# Patient Record
Sex: Female | Born: 1945
Health system: Southern US, Community
[De-identification: ages and names within clinical notes are randomized; demographics above are authoritative.]

## PROBLEM LIST (undated history)

## (undated) DIAGNOSIS — U099 Post covid-19 condition, unspecified: Secondary | ICD-10-CM

## (undated) DIAGNOSIS — F329 Major depressive disorder, single episode, unspecified: Secondary | ICD-10-CM

## (undated) DIAGNOSIS — T7840XA Allergy, unspecified, initial encounter: Secondary | ICD-10-CM

## (undated) DIAGNOSIS — F419 Anxiety disorder, unspecified: Secondary | ICD-10-CM

## (undated) DIAGNOSIS — I351 Nonrheumatic aortic (valve) insufficiency: Secondary | ICD-10-CM

## (undated) DIAGNOSIS — E782 Mixed hyperlipidemia: Secondary | ICD-10-CM

## (undated) DIAGNOSIS — R42 Dizziness and giddiness: Secondary | ICD-10-CM

## (undated) DIAGNOSIS — H269 Unspecified cataract: Secondary | ICD-10-CM

## (undated) DIAGNOSIS — I5020 Unspecified systolic (congestive) heart failure: Secondary | ICD-10-CM

## (undated) DIAGNOSIS — F32A Depression, unspecified: Secondary | ICD-10-CM

## (undated) DIAGNOSIS — G43909 Migraine, unspecified, not intractable, without status migrainosus: Secondary | ICD-10-CM

## (undated) DIAGNOSIS — K219 Gastro-esophageal reflux disease without esophagitis: Secondary | ICD-10-CM

## (undated) DIAGNOSIS — R053 Chronic cough: Secondary | ICD-10-CM

## (undated) DIAGNOSIS — O2230 Deep phlebothrombosis in pregnancy, unspecified trimester: Secondary | ICD-10-CM

## (undated) DIAGNOSIS — I340 Nonrheumatic mitral (valve) insufficiency: Secondary | ICD-10-CM

## (undated) DIAGNOSIS — I5189 Other ill-defined heart diseases: Secondary | ICD-10-CM

## (undated) HISTORY — DX: Unspecified cataract: H26.9

## (undated) HISTORY — PX: TUBAL LIGATION: SHX77

## (undated) HISTORY — DX: Anxiety disorder, unspecified: F41.9

## (undated) HISTORY — PX: ROTATOR CUFF REPAIR: SHX139

## (undated) HISTORY — DX: Gastro-esophageal reflux disease without esophagitis: K21.9

## (undated) HISTORY — DX: Allergy, unspecified, initial encounter: T78.40XA

## (undated) HISTORY — DX: Mixed hyperlipidemia: E78.2

## (undated) HISTORY — DX: Depression, unspecified: F32.A

## (undated) HISTORY — DX: Major depressive disorder, single episode, unspecified: F32.9

---

## 2000-08-04 ENCOUNTER — Other Ambulatory Visit: Admission: RE | Admit: 2000-08-04 | Discharge: 2000-08-04 | Payer: Self-pay | Admitting: Plastic Surgery

## 2000-10-05 ENCOUNTER — Other Ambulatory Visit: Admission: RE | Admit: 2000-10-05 | Discharge: 2000-10-05 | Payer: Self-pay | Admitting: Obstetrics & Gynecology

## 2005-10-25 ENCOUNTER — Ambulatory Visit: Payer: Self-pay | Admitting: Gastroenterology

## 2009-01-10 ENCOUNTER — Ambulatory Visit: Payer: Self-pay | Admitting: Family Medicine

## 2011-06-19 ENCOUNTER — Emergency Department: Payer: Self-pay | Admitting: Unknown Physician Specialty
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
## 2013-03-12 LAB — HM PAP SMEAR: HM Pap smear: NORMAL

## 2013-05-23 HISTORY — PX: LUMBAR FUSION: SHX111

## 2013-06-13 ENCOUNTER — Emergency Department: Payer: Self-pay | Admitting: Emergency Medicine

## 2013-06-13 ENCOUNTER — Inpatient Hospital Stay (HOSPITAL_COMMUNITY)
Admission: AD | Admit: 2013-06-13 | Discharge: 2013-06-26 | DRG: 460 | Disposition: A | Discharge status: Skilled Nursing Facility | Payer: Medicare Other | Source: Other Acute Inpatient Hospital | Attending: Neurosurgery | Admitting: Neurosurgery

## 2013-06-13 DIAGNOSIS — M48061 Spinal stenosis, lumbar region without neurogenic claudication: Secondary | ICD-10-CM | POA: Diagnosis present

## 2013-06-13 DIAGNOSIS — M545 Low back pain, unspecified: Secondary | ICD-10-CM | POA: Diagnosis present

## 2013-06-13 DIAGNOSIS — S32001A Stable burst fracture of unspecified lumbar vertebra, initial encounter for closed fracture: Secondary | ICD-10-CM

## 2013-06-13 DIAGNOSIS — K5900 Constipation, unspecified: Secondary | ICD-10-CM | POA: Diagnosis not present

## 2013-06-13 DIAGNOSIS — J9819 Other pulmonary collapse: Secondary | ICD-10-CM | POA: Diagnosis not present

## 2013-06-13 DIAGNOSIS — Z79899 Other long term (current) drug therapy: Secondary | ICD-10-CM

## 2013-06-13 DIAGNOSIS — S32009A Unspecified fracture of unspecified lumbar vertebra, initial encounter for closed fracture: Secondary | ICD-10-CM | POA: Diagnosis present
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
## 2013-06-13 IMAGING — CT CT LUMBAR SPINE WITHOUT CONTRAST
1 series · 12 of 14 positions shown, 15 images · non-contrast
Comparison: none

REASON FOR EXAM: fall, pain in lower back - compression on plain film
COMMENTS:

[Series 2: axials · axial · 0.35mm/px · z∈[-232,-34]mm · 12 of 78 slices shown, 15 images]
[im 6/78  soft-tissue]
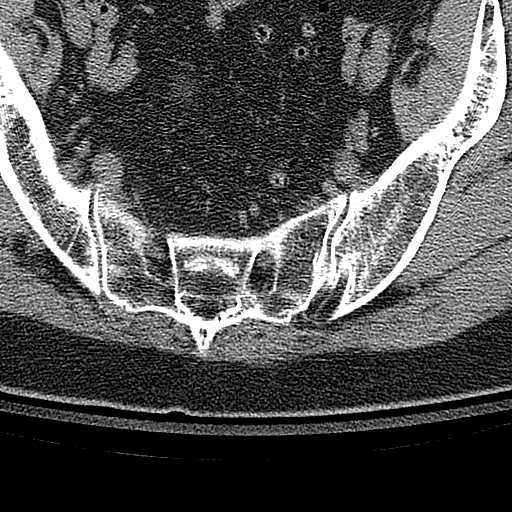
[im 6/78  bone]
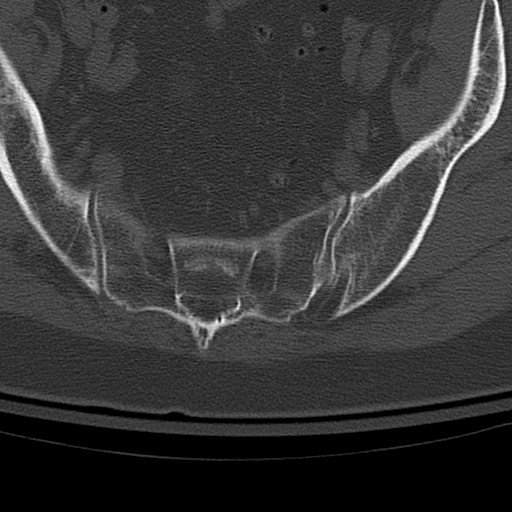
[im 12/78  bone]
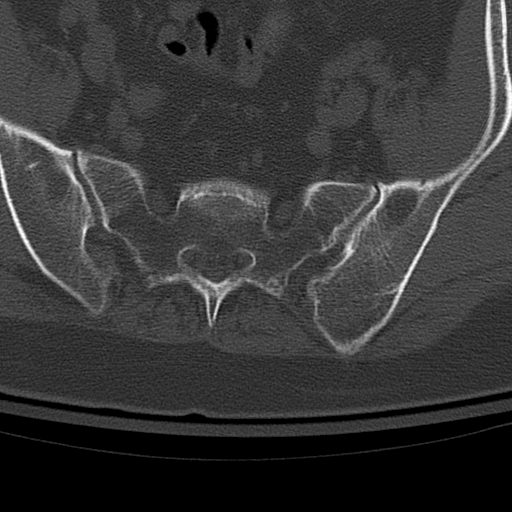
[im 18/78  bone]
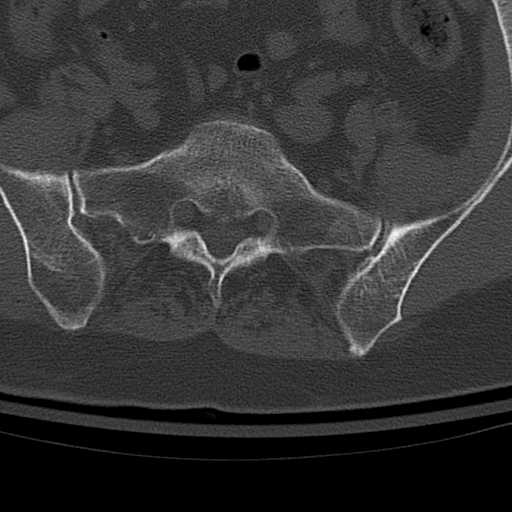
[im 24/78  bone]
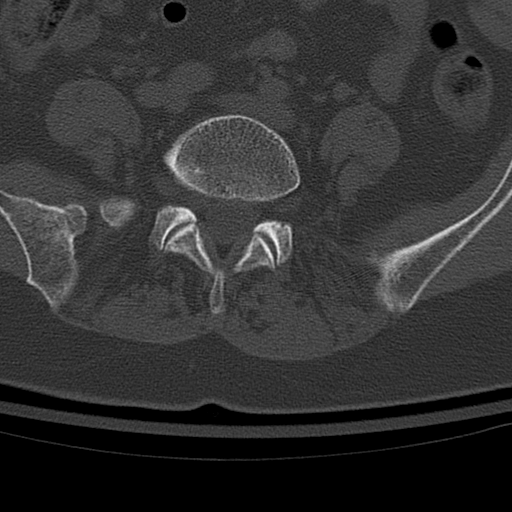
[im 30/78  soft-tissue]
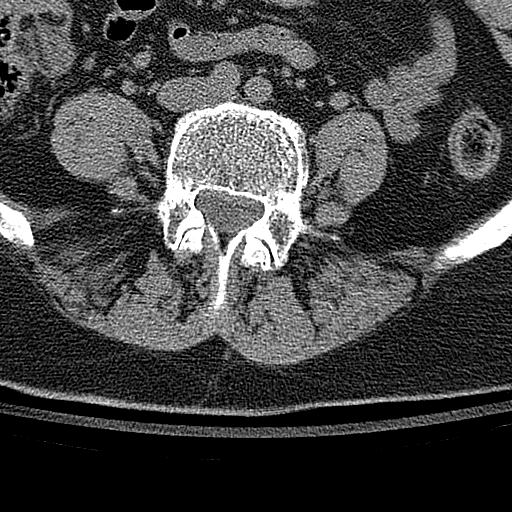
[im 30/78  bone]
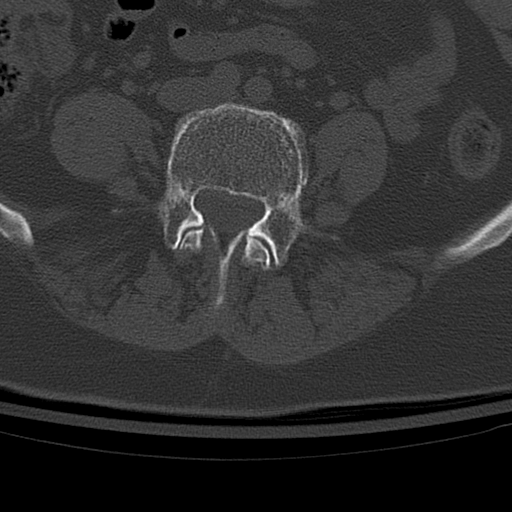
[im 36/78  bone]
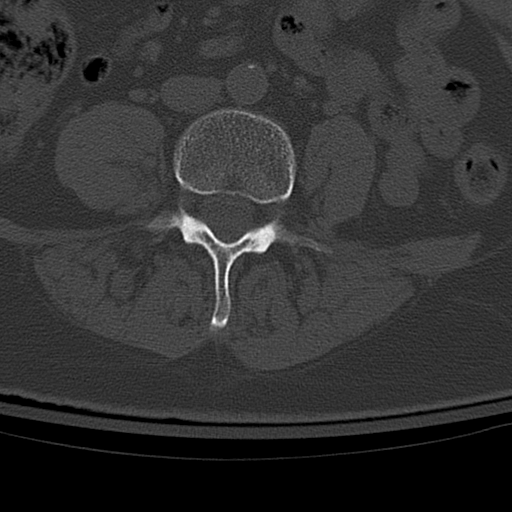
[im 42/78  bone]
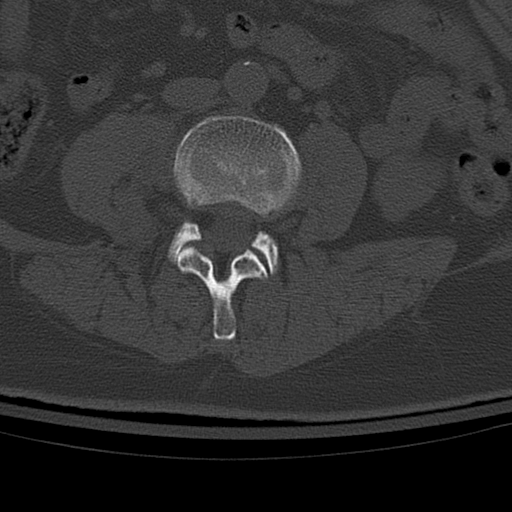
[im 48/78  bone]
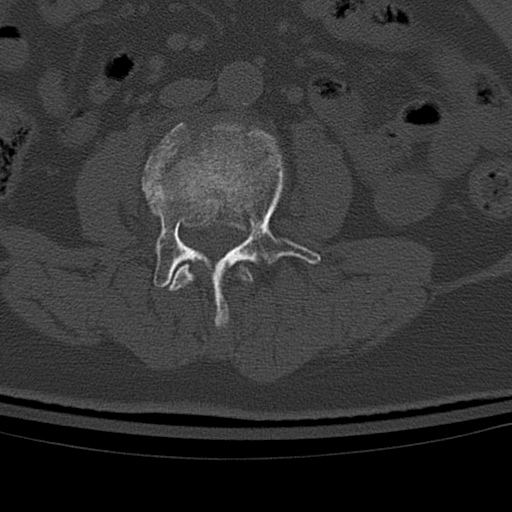
[im 54/78  soft-tissue]
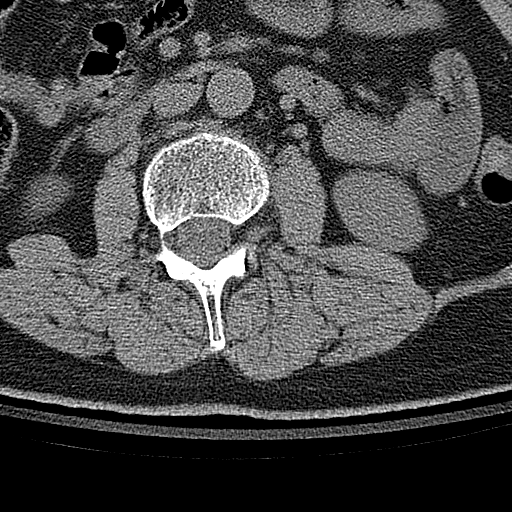
[im 54/78  bone]
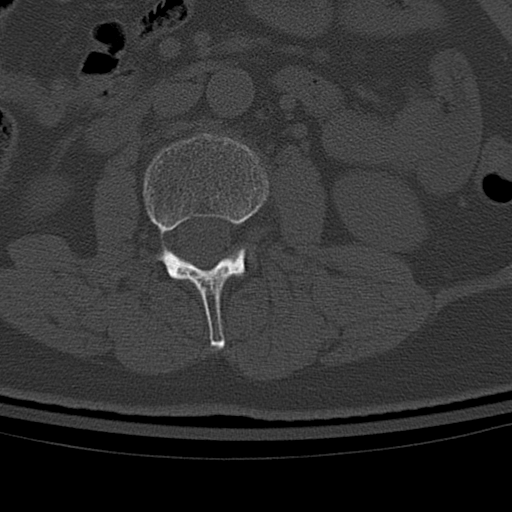
[im 60/78  bone]
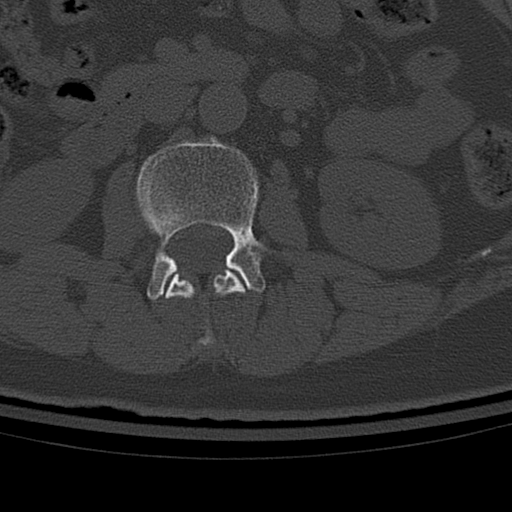
[im 66/78  bone]
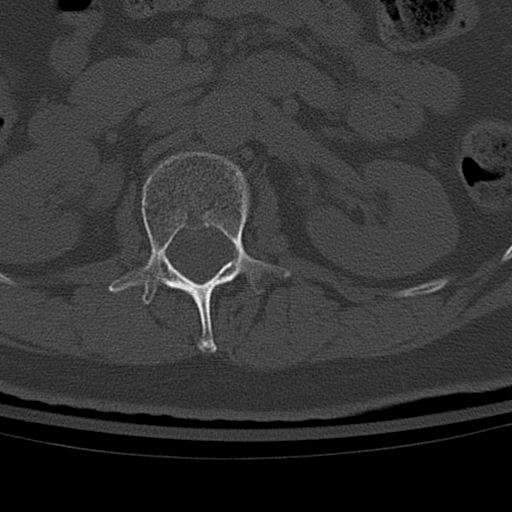
[im 72/78  bone]
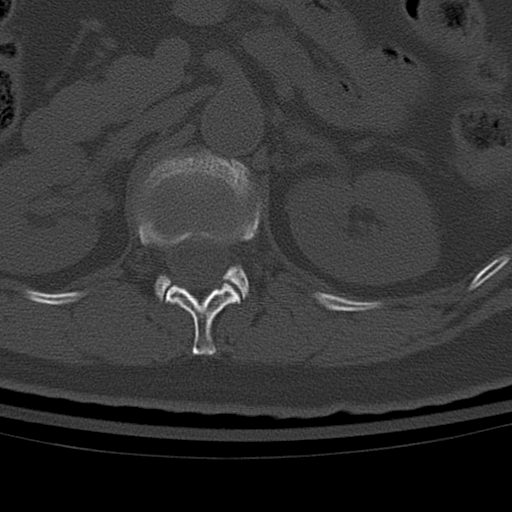

[12 of 14 positions shown; findings below may reference images not displayed]

PROCEDURE:     CT  - CT LUMBAR SPINE WO  - [DATE]  [DATE]

RESULT:     Multislice helical acquisition through the lumbar spine shows
compression fracture changes involving L3. These are of uncertain
chronicity. There is loss of height of at least 50 to 60% centrally. There
is slight bowing of the posterior wall without significant bony
retropulsion. Correlate clinically. The compression is greater to the right
than the left. Fracture lucency is present. The other vertebral bodies
appear to be maintained in height and show grossly normal alignment. There
is L5-S1 degenerative disc narrowing.
IMPRESSION: 1. Compression fracture involving the L3 vertebral body that appears to be
at least 50 to 60% and likely subacute. Slight posterior bulging of the wall
of vertebral body causes some mild narrowing of the spinal canal to
approximately 1.3 cm. This is likely not clinically significant. Correlate
with physical findings.

[REDACTED]

## 2013-06-13 MED ORDER — HYDROCODONE-ACETAMINOPHEN 5-325 MG PO TABS: 1.0000 | ORAL_TABLET | ORAL | Status: DC | PRN

## 2013-06-13 MED ORDER — NORTRIPTYLINE HCL 10 MG PO CAPS
20.0000 mg | ORAL_CAPSULE | Freq: Every day | ORAL | Status: DC
  Administered 2013-06-13 – 2013-06-19 (×7): 20 mg via ORAL
  Filled 2013-06-13 (×9): qty 2

## 2013-06-13 MED ORDER — OXYCODONE-ACETAMINOPHEN 5-325 MG PO TABS
1.0000 | ORAL_TABLET | ORAL | Status: DC | PRN
  Administered 2013-06-13: 2 via ORAL
  Administered 2013-06-15: 1 via ORAL
  Filled 2013-06-13: qty 1
  Filled 2013-06-13 (×3): qty 2

## 2013-06-13 MED ORDER — MENTHOL 3 MG MT LOZG: 1.0000 | LOZENGE | OROMUCOSAL | Status: DC | PRN

## 2013-06-13 MED ORDER — ALUM & MAG HYDROXIDE-SIMETH 200-200-20 MG/5ML PO SUSP: 30.0000 mL | Freq: Four times a day (QID) | ORAL | Status: DC | PRN

## 2013-06-13 MED ORDER — ONDANSETRON HCL 4 MG/2ML IJ SOLN
4.0000 mg | INTRAMUSCULAR | Status: DC | PRN
  Administered 2013-06-14 – 2013-06-19 (×2): 4 mg via INTRAVENOUS
  Filled 2013-06-13 (×2): qty 2

## 2013-06-13 MED ORDER — PHENOL 1.4 % MT LIQD: 1.0000 | OROMUCOSAL | Status: DC | PRN

## 2013-06-13 MED ORDER — ACETAMINOPHEN 650 MG RE SUPP: 650.0000 mg | RECTAL | Status: DC | PRN

## 2013-06-13 MED ORDER — NAPHAZOLINE HCL 0.1 % OP SOLN: 1.0000 [drp] | Freq: Four times a day (QID) | OPHTHALMIC | Status: DC | PRN

## 2013-06-13 MED ORDER — PANTOPRAZOLE SODIUM 40 MG IV SOLR
40.0000 mg | Freq: Every day | INTRAVENOUS | Status: DC
  Administered 2013-06-13: 40 mg via INTRAVENOUS
  Filled 2013-06-13 (×3): qty 40

## 2013-06-13 MED ORDER — ZOLPIDEM TARTRATE 5 MG PO TABS: 5.0000 mg | ORAL_TABLET | Freq: Every evening | ORAL | Status: DC | PRN

## 2013-06-13 MED ORDER — DOCUSATE SODIUM 100 MG PO CAPS
100.0000 mg | ORAL_CAPSULE | Freq: Two times a day (BID) | ORAL | Status: DC
  Administered 2013-06-13 – 2013-06-19 (×10): 100 mg via ORAL
  Filled 2013-06-13 (×9): qty 1

## 2013-06-13 MED ORDER — DIAZEPAM 5 MG PO TABS
5.0000 mg | ORAL_TABLET | Freq: Four times a day (QID) | ORAL | Status: DC | PRN
  Administered 2013-06-15 – 2013-06-19 (×2): 5 mg via ORAL
  Filled 2013-06-13 (×2): qty 1

## 2013-06-13 MED ORDER — MORPHINE SULFATE 2 MG/ML IJ SOLN
1.0000 mg | INTRAMUSCULAR | Status: DC | PRN
  Administered 2013-06-14 (×2): 4 mg via INTRAVENOUS
  Administered 2013-06-14 (×2): 2 mg via INTRAVENOUS
  Administered 2013-06-14: 4 mg via INTRAVENOUS
  Administered 2013-06-15 – 2013-06-19 (×9): 2 mg via INTRAVENOUS
  Administered 2013-06-19 (×4): 4 mg via INTRAVENOUS
  Filled 2013-06-13 (×2): qty 1
  Filled 2013-06-13: qty 2
  Filled 2013-06-13: qty 1
  Filled 2013-06-13 (×3): qty 2
  Filled 2013-06-13 (×2): qty 1
  Filled 2013-06-13: qty 2
  Filled 2013-06-13: qty 1
  Filled 2013-06-13: qty 2
  Filled 2013-06-13 (×2): qty 1
  Filled 2013-06-13: qty 2
  Filled 2013-06-13 (×3): qty 1

## 2013-06-13 MED ORDER — ACETAMINOPHEN 325 MG PO TABS: 650.0000 mg | ORAL_TABLET | ORAL | Status: DC | PRN
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
## 2013-06-14 ENCOUNTER — Other Ambulatory Visit: Payer: Self-pay | Admitting: Neurosurgery

## 2013-06-14 MED ORDER — PANTOPRAZOLE SODIUM 40 MG PO TBEC
40.0000 mg | DELAYED_RELEASE_TABLET | Freq: Every day | ORAL | Status: DC
  Administered 2013-06-14 – 2013-06-26 (×12): 40 mg via ORAL
  Filled 2013-06-14 (×10): qty 1
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
## 2013-06-14 NOTE — Progress Notes (Signed)
Orthopedic Tech Progress Note Patient Details:  Teresa Sherman [birth year] [id number] Ordered TLSO from [employer]. Patient ID: Teresa Sherman, female   DOB: 06-07-1946, 67 y.o.   MRN: [id number]   Lesle Chris 06/14/2013, 10:00 AM

## 2013-06-14 NOTE — Plan of Care (Signed)
Pt 02 was 89% while sleeping. HOB flat. Pt placed on 2L of 02 via [place]. Will cont to monitor.

## 2013-06-14 NOTE — Progress Notes (Signed)
Patient ID: Teresa Sherman, female   DOB: [birth year], 67 y.o.   MRN: [id number] Subjective:  The patient is alert and pleasant. She complains of mid lumbar pain. She denies radicular symptoms.  Objective: Vital signs in last 24 hours: Temp:  [97.7 F (36.5 C)-98.1 F (36.7 C)] 97.7 F (36.5 C) (10/23 0510) Pulse Rate:  [74-91] 91 (10/23 0510) Resp:  [18] 18 (10/23 0510) BP: (130-143)/(85) 130/85 mmHg (10/23 0510) SpO2:  [94 %-100 %] 94 % (10/23 0510)  Intake/Output from previous day:   Intake/Output this shift: Total I/O In: 240 [P.O.:240] Out: 1 [Urine:1]  Physical exam patient is moving her lower extremities well. She is alert and oriented.  Lab Results: No results found for this basename: WBC, HGB, HCT, PLT,  in the last 72 hours BMET No results found for this basename: NA, K, CL, CO2, GLUCOSE, BUN, CREATININE, CALCIUM,  in the last 72 hours  Studies/Results: No results found.  Assessment/Plan: L3 compression fracture: The patient wants to avoid surgery. We'll try to mobilize her in a TLSO. She may ultimately need a posterior decompression and fusion. We'll see how she does with mobilization.  LOS: 1 day     , D 06/14/2013, 10:22 AM
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
## 2013-06-15 ENCOUNTER — Encounter (HOSPITAL_COMMUNITY): Payer: Self-pay | Admitting: *Deleted

## 2013-06-15 MED ORDER — PANTOPRAZOLE SODIUM 40 MG PO TBEC: 40.0000 mg | DELAYED_RELEASE_TABLET | Freq: Every day | ORAL | Status: DC

## 2013-06-15 NOTE — Progress Notes (Signed)
PT Cancellation and Discharge Note  Patient Details Name: TROY HARTZOG MRN: [id number] DOB: [birth year]   Cancelled Treatment:    Reason Eval/Treat Not Completed: Other (comment).  Noted plan is for surgery next week.  Pt declining PT until after surgery.  Will sign off and need new orders post-op.     Sunny Schlein, [place] [phone] 06/15/2013, 9:30 AM

## 2013-06-15 NOTE — Progress Notes (Signed)
Patient ID: Teresa Sherman, female   DOB: 01-01-1946, 67 y.o.   MRN: [id number] Subjective:  The patient is alert and pleasant. She complains of an upset stomach. She was not able to mobilize because of her back pain yesterday.  Objective: Vital signs in last 24 hours: Temp:  [97.4 F (36.3 C)-98.8 F (37.1 C)] 97.7 F (36.5 C) (10/24 0600) Pulse Rate:  [96-110] 100 (10/24 0600) Resp:  [18-20] 18 (10/24 0600) BP: (129-153)/(83-98) 129/83 mmHg (10/24 0600) SpO2:  [87 %-100 %] 98 % (10/24 0600)  Intake/Output from previous day: 10/23 0701 - 10/24 0700 In: 480 [P.O.:480] Out: 1 [Urine:1] Intake/Output this shift:    Physical exam patient is alert and oriented. Her lower extremity strength is grossly normal.  Lab Results: No results found for this basename: WBC, HGB, HCT, PLT,  in the last 72 hours BMET No results found for this basename: NA, K, CL, CO2, GLUCOSE, BUN, CREATININE, CALCIUM,  in the last 72 hours  Studies/Results: No results found.  Assessment/Plan: L3 fracture: He looks like the patient will likely need surgery. I briefly discussed this with her. I will tentatively planned for next week.  LOS: 2 days     , D 06/15/2013, 8:03 AM

## 2013-06-16 NOTE — Progress Notes (Signed)
Patient ID: Teresa Sherman, female   DOB: 1946/01/30, 67 y.o.   MRN: [id number] Subjective:  the patient is alert and pleasant. She is not able to mobilize second to back pain. She denies lower extremity weakness, numbness, tingling etc.  Objective: Vital signs in last 24 hours: Temp:  [97.3 F (36.3 C)-98.9 F (37.2 C)] 97.6 F (36.4 C) (10/25 0540) Pulse Rate:  [104-118] 110 (10/25 0540) Resp:  [18] 18 (10/25 0540) BP: (125-146)/(88-96) 135/90 mmHg (10/25 0540) SpO2:  [92 %-96 %] 94 % (10/25 0540)  Intake/Output from previous day:   Intake/Output this shift:    Physical exam the patient is alert and oriented. She is moving her lower extremities well.  Lab Results: No results found for this basename: WBC, HGB, HCT, PLT,  in the last 72 hours BMET No results found for this basename: NA, K, CL, CO2, GLUCOSE, BUN, CREATININE, CALCIUM,  in the last 72 hours  Studies/Results: No results found.  Assessment/Plan: L3 fracture: This fracture is likely unstable. She has failed conservative management. We discussed the various treatment options including prolonged bed rest versus surgery. I have described the surgical treatment option of a open reduction of her fracture with a posterior instrumentation and fusion with pedicle screws and rods from approximately L1 to L5. I have described the surgery to her. We have discussed the risks of surgery including risks of anesthesia, hemorrhage, infection, spinal fluid, instrumentation malplacement or malfunction,fusion failure, nerve injury, medical risk, failure to relieve pain, worsening pain, etc. I have answered all the patient's questions. She wants to proceed with surgery. We are going tissue for Wednesday.  LOS: 3 days     , D 06/16/2013, 9:34 AM

## 2013-06-17 NOTE — Progress Notes (Signed)
No issues overnight. Pt c/o back pain although controlled with morphine.  EXAM:  BP 97/69  Pulse 109  Temp(Src) 97.9 F (36.6 C) (Oral)  Resp 18  Ht 5' (1.524 m)  Wt 61.236 kg (135 lb)  BMI 26.37 kg/m2  SpO2 97%  Awake, alert, oriented  Speech fluent, appropriate  CN grossly intact  5/5 BUE/BLE x decreased right Q strength 4+/5  IMPRESSION:  67 y.o. female with L3 fracture, planning operative stabilization  PLAN: - Cont bedrest - Pain control - Plan on operative stabilization Wed 10/29

## 2013-06-18 NOTE — Progress Notes (Signed)
Patient ID: Teresa Sherman, female   DOB: 22-Dec-1945, 67 y.o.   MRN: [id number] Subjective:  The patient is alert and pleasant. She continues to complain of back pain.  Objective: Vital signs in last 24 hours: Temp:  [97.9 F (36.6 C)-98.9 F (37.2 C)] 97.9 F (36.6 C) (10/27 0621) Pulse Rate:  [99-113] 109 (10/27 0621) Resp:  [18] 18 (10/27 0621) BP: (97-127)/(69-82) 117/81 mmHg (10/27 0621) SpO2:  [91 %-98 %] 98 % (10/27 0621)  Intake/Output from previous day:   Intake/Output this shift:    Physical exam the patient is alert and oriented. Her lower extremity strength is grossly normal.  Lab Results: No results found for this basename: WBC, HGB, HCT, PLT,  in the last 72 hours BMET No results found for this basename: NA, K, CL, CO2, GLUCOSE, BUN, CREATININE, CALCIUM,  in the last 72 hours  Studies/Results: No results found.  Assessment/Plan: L3 fracture: I answered all the patient's regarding surgery she wants to proceed with surgery as planned on Wednesday.  LOS: 5 days     , D 06/18/2013, 7:59 AM

## 2013-06-19 ENCOUNTER — Inpatient Hospital Stay (HOSPITAL_COMMUNITY): Payer: Medicare Other

## 2013-06-19 LAB — SURGICAL PCR SCREEN
MRSA, PCR: NEGATIVE
Staphylococcus aureus: NEGATIVE

## 2013-06-19 LAB — PROTIME-INR: INR: 1.03 (ref 0.00–1.49)

## 2013-06-19 LAB — COMPREHENSIVE METABOLIC PANEL
ALT: 16 U/L (ref 0–35)
AST: 15 U/L (ref 0–37)
Alkaline Phosphatase: 76 U/L (ref 39–117)
CO2: 24 mEq/L (ref 19–32)
Chloride: 99 mEq/L (ref 96–112)
Creatinine, Ser: 0.55 mg/dL (ref 0.50–1.10)
GFR calc non Af Amer: >90 mL/min (ref >90)
Potassium: 3.6 mEq/L (ref 3.5–5.1)
Sodium: 135 mEq/L (ref 135–145)
Total Bilirubin: 0.4 mg/dL (ref 0.3–1.2)

## 2013-06-19 LAB — CBC
HCT: 40.8 % (ref 36.0–46.0)
MCHC: 35.3 g/dL (ref 30.0–36.0)
MCV: 88.3 fL (ref 78.0–100.0)
Platelets: 202 10*3/uL (ref 150–400)
RBC: 4.62 MIL/uL (ref 3.87–5.11)
WBC: 6.3 10*3/uL (ref 4.0–10.5)

## 2013-06-19 LAB — ABO/RH: ABO/RH(D): O POS

## 2013-06-19 LAB — TYPE AND SCREEN: ABO/RH(D): O POS

## 2013-06-19 IMAGING — CR DG CHEST 1V PORT
1 series · 1 of 1 positions shown · non-contrast
Comparison: None.

CLINICAL DATA: Preop lumbar surgery.

EXAM:
PORTABLE CHEST - 1 VIEW

[AP]
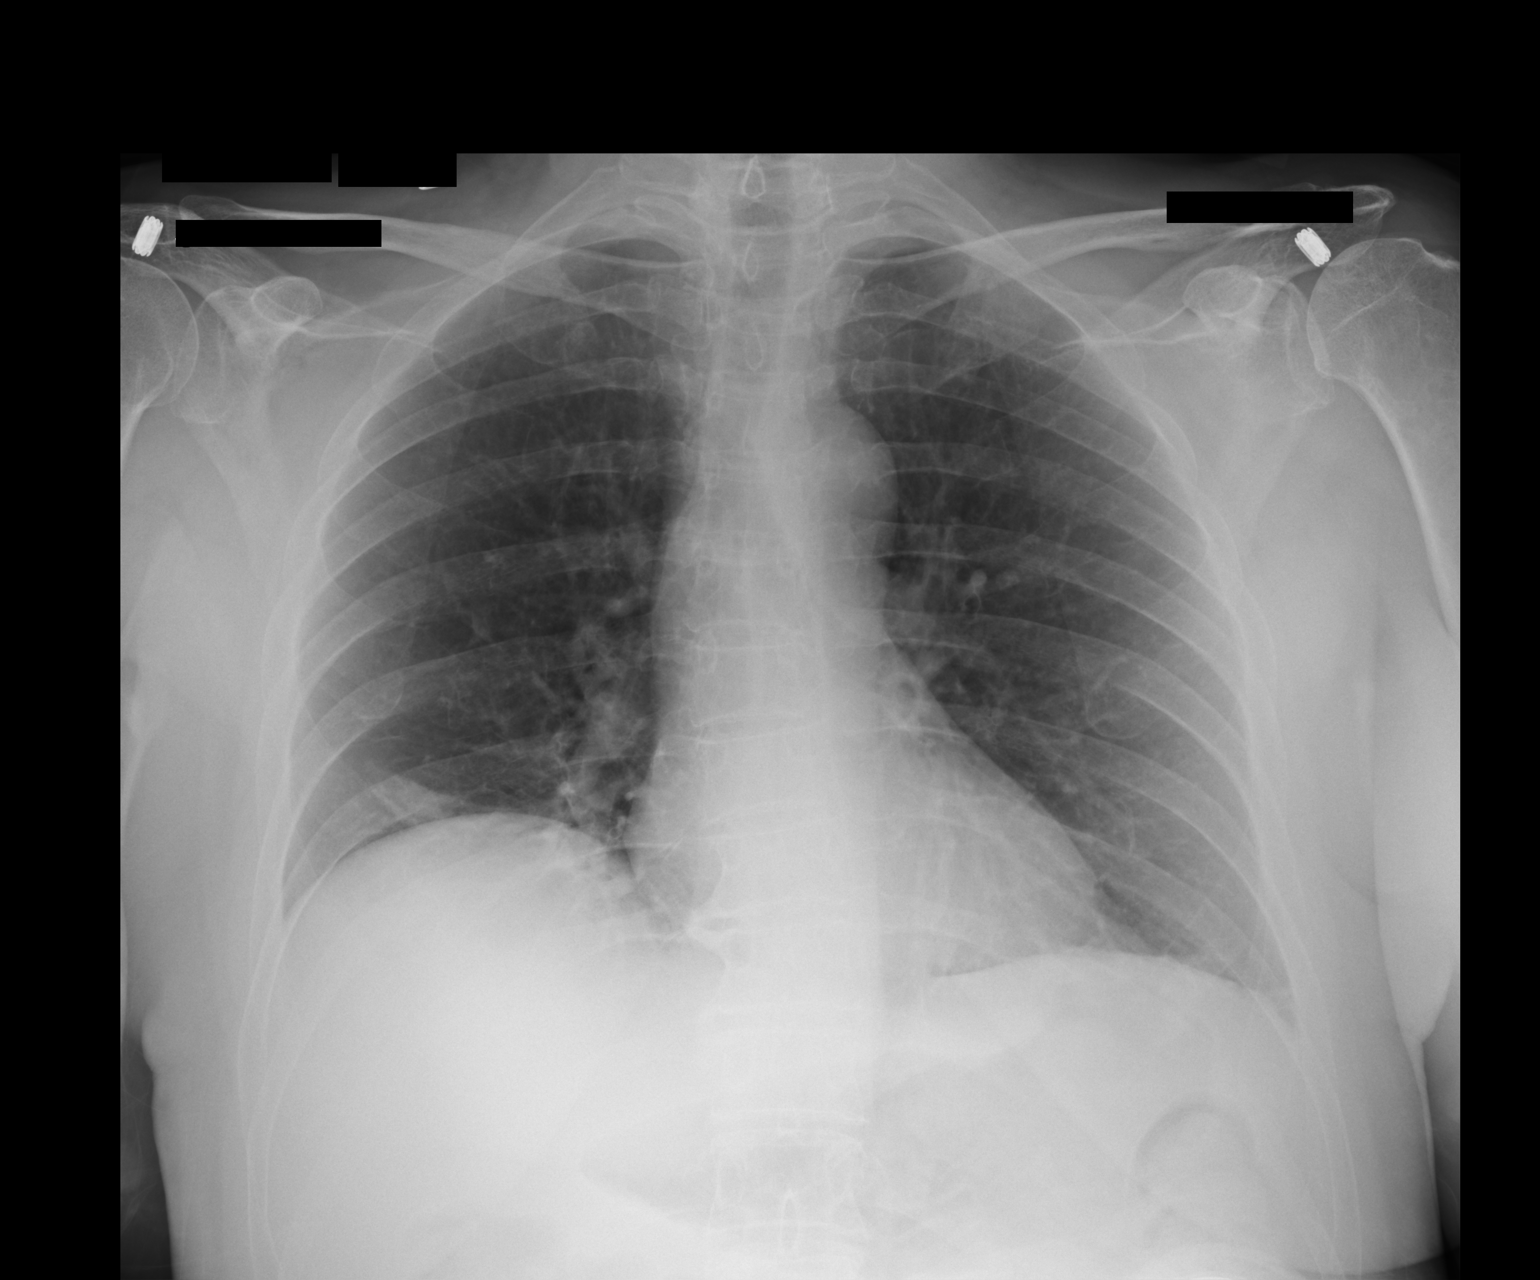

[1 of 1 positions shown; findings below may reference images not displayed]

FINDINGS: The heart size and mediastinal contours are within normal limits.
Left lung is clear. Linear density seen in right lung base
concerning for subsegmental atelectasis or scarring. The visualized
skeletal structures are unremarkable.
IMPRESSION: Mild right basilar subsegmental atelectasis or scarring. No other
abnormality seen in the chest.

## 2013-06-19 NOTE — Progress Notes (Signed)
Occupational Therapy Discharge Patient Details Name: Teresa Sherman MRN: [id number] DOB: [birth year] Today's Date: 06/19/2013 Time:  -     Patient discharged from OT services secondary to surgery Wednesday 06/20/13.  Please see latest therapy progress note for current level of functioning and progress toward goals.    Progress and discharge plan discussed with patient and/or caregiver: Patient/Caregiver agrees with plan  GO     Harolyn Rutherford Pager: [phone]  06/19/2013, 11:24 AM

## 2013-06-19 NOTE — Progress Notes (Signed)
Patient ID: Teresa Sherman, female   DOB: 07-02-1946, 67 y.o.   MRN: [id number] Subjective:  The patient is alert and pleasant. She wants to proceed with surgery tomorrow as scheduled. I have answered all her questions.  Objective: Vital signs in last 24 hours: Temp:  [97.3 F (36.3 C)-98.9 F (37.2 C)] 97.9 F (36.6 C) (10/28 0555) Pulse Rate:  [101-116] 108 (10/28 0555) Resp:  [18-20] 20 (10/28 0555) BP: (124-129)/(78-89) 129/80 mmHg (10/28 0555) SpO2:  [96 %-98 %] 98 % (10/28 0555)  Intake/Output from previous day: 10/27 0701 - 10/28 0700 In: 360 [P.O.:360] Out: -  Intake/Output this shift:    Physical exam the patient is alert and oriented. Her strength is normal in her lower extremities.  Lab Results: No results found for this basename: WBC, HGB, HCT, PLT,  in the last 72 hours BMET No results found for this basename: NA, K, CL, CO2, GLUCOSE, BUN, CREATININE, CALCIUM,  in the last 72 hours  Studies/Results: No results found.  Assessment/Plan: L3 fracture: I have answered all the patient's questions regarding surgery we have discussed the risks, benefits, and alternatives. We will plan to proceed with a lumbar decompression and fusion tomorrow.  LOS: 6 days     , D 06/19/2013, 8:10 AM

## 2013-06-20 ENCOUNTER — Inpatient Hospital Stay (HOSPITAL_COMMUNITY): Payer: Medicare Other

## 2013-06-20 ENCOUNTER — Encounter (HOSPITAL_COMMUNITY): Payer: Self-pay | Admitting: Anesthesiology

## 2013-06-20 ENCOUNTER — Encounter (HOSPITAL_COMMUNITY): Payer: Medicare Other | Admitting: Anesthesiology

## 2013-06-20 ENCOUNTER — Inpatient Hospital Stay (HOSPITAL_COMMUNITY): Payer: Medicare Other | Admitting: Anesthesiology

## 2013-06-20 ENCOUNTER — Encounter (HOSPITAL_COMMUNITY)
Admission: AD | Disposition: A | Discharge status: Skilled Nursing Facility | Payer: Medicare Other | Source: Other Acute Inpatient Hospital | Attending: Neurosurgery

## 2013-06-20 IMAGING — RF DG C-ARM 61-120 MIN
1 series · 5 of 5 positions shown · non-contrast
Comparison: Intraoperative radiographs obtained earlier today at
[DATE] p.m.

CLINICAL DATA: L1-5 decompression with instrumentation and fusion

EXAM:
DG C-ARM 1-60 MIN; LUMBAR SPINE - 2-3 VIEW

[Series 1: run · 5 of 5 slices shown]
[im 1/5]
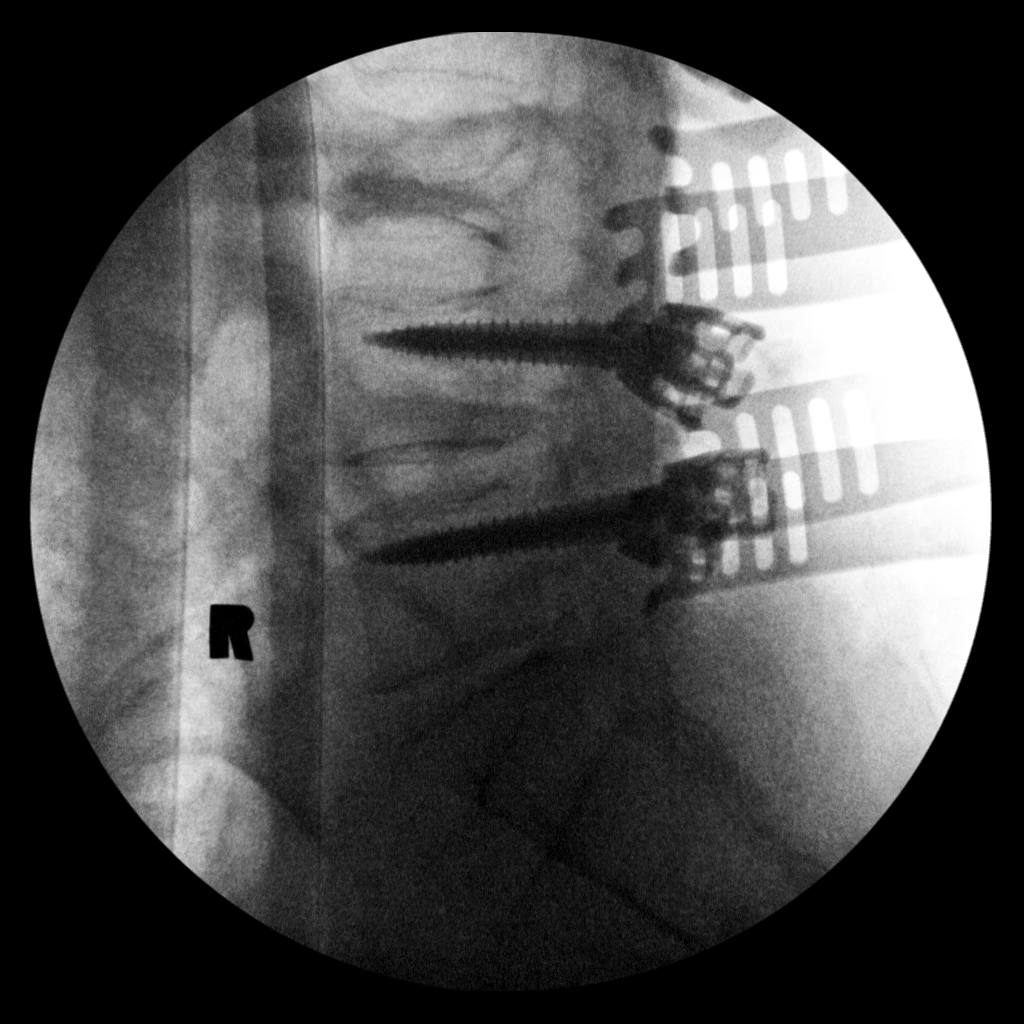
[im 2/5]
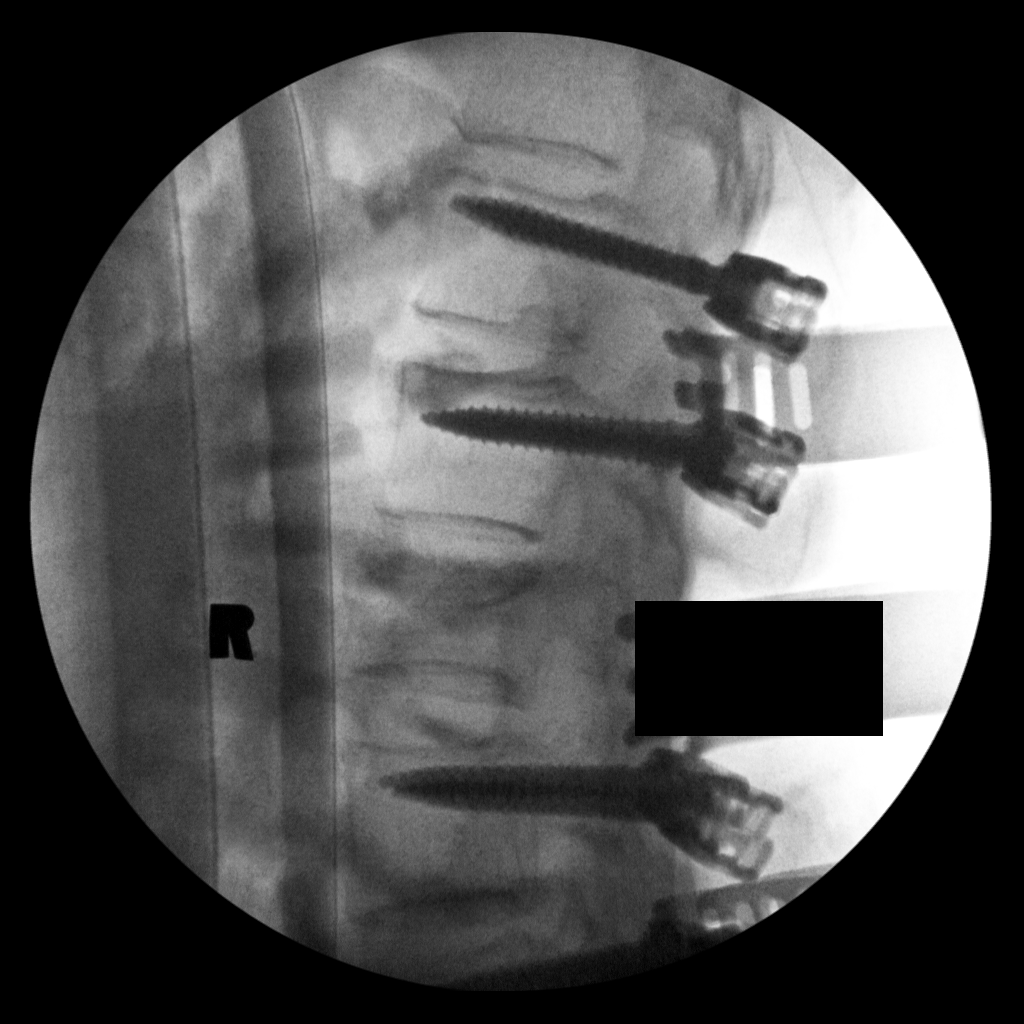
[im 3/5]
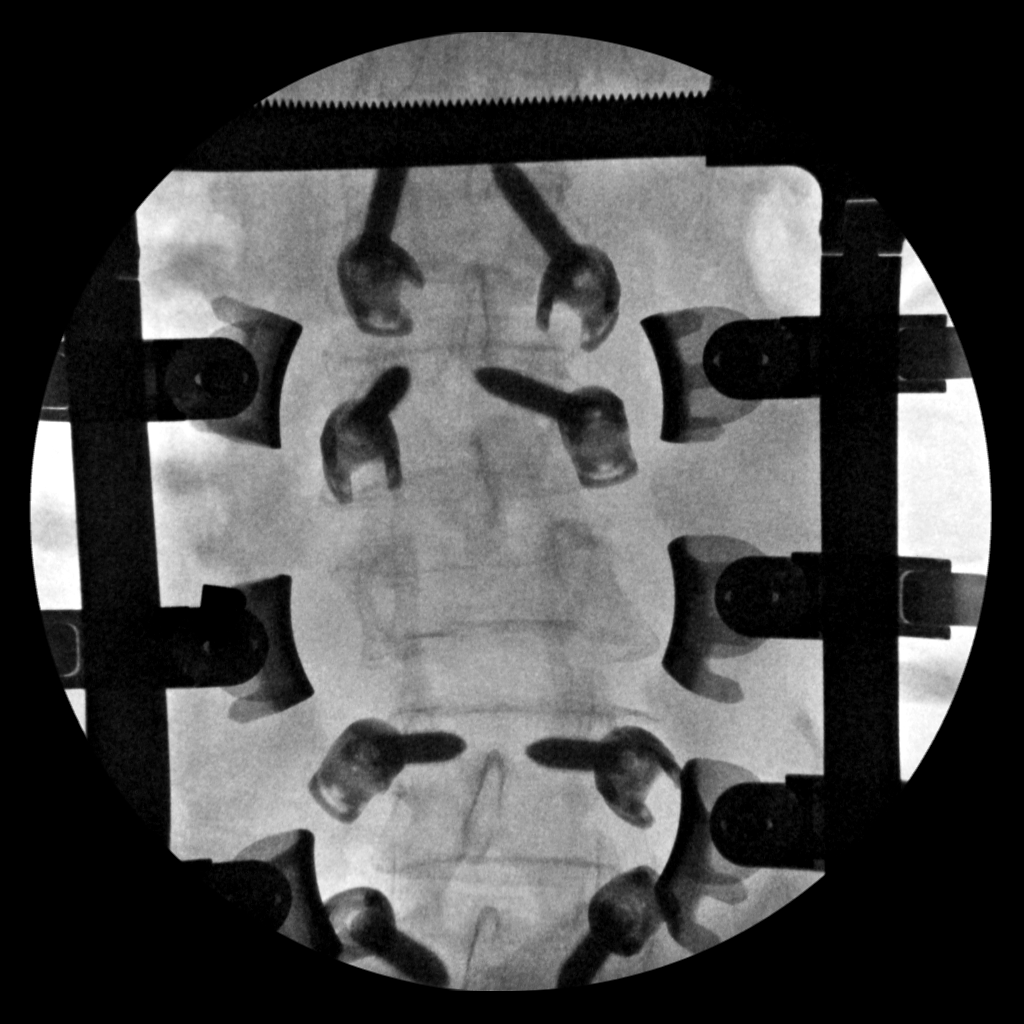
[im 4/5]
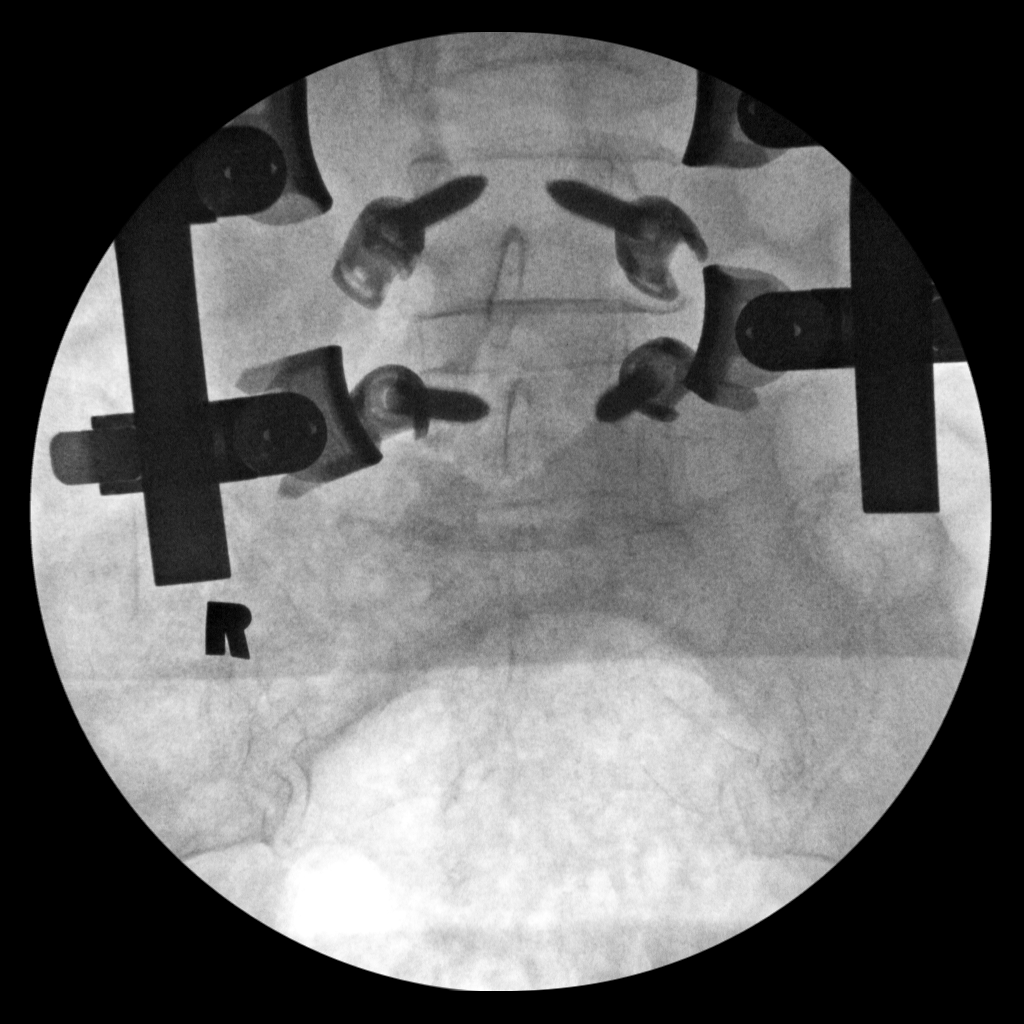
[im 5/5]
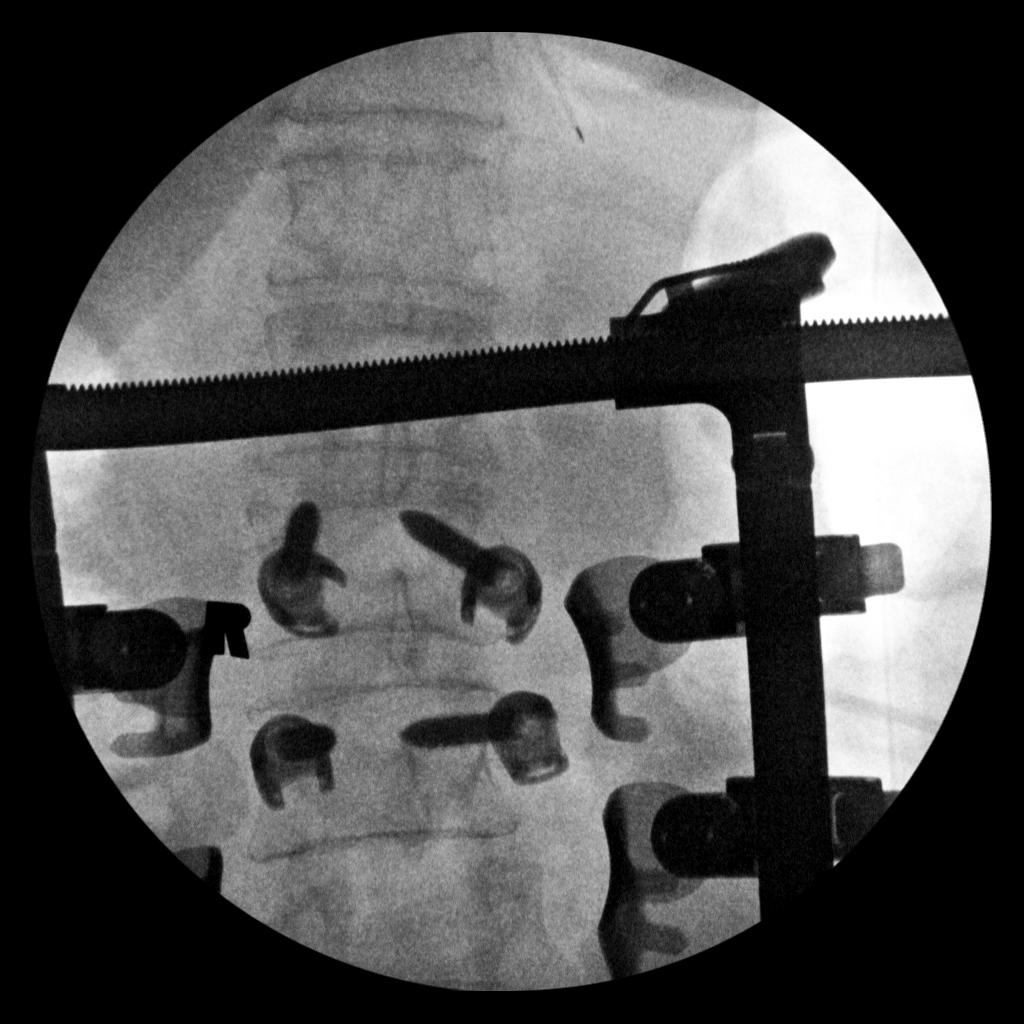

[5 of 5 positions shown; findings below may reference images not displayed]

FINDINGS: A total of 6 intraoperative spot views demonstrate placement of
bilateral pedicle screws at L1, L2-L4 and L5. There is a compression
fracture at L3. No evidence of hardware complication involving the
pedicle screws. No rods are visible on these spot radiographs.
IMPRESSION: Intraoperative radiographs obtained during L1-L5 decompression and
posterior fusion.

## 2013-06-20 IMAGING — DX DG LUMBAR SPINE 1V
1 series · 1 of 1 positions shown · non-contrast
Comparison: None.

CLINICAL DATA: L2-5 PLIF.

EXAM:
LUMBAR SPINE - 1 VIEW

[lat]
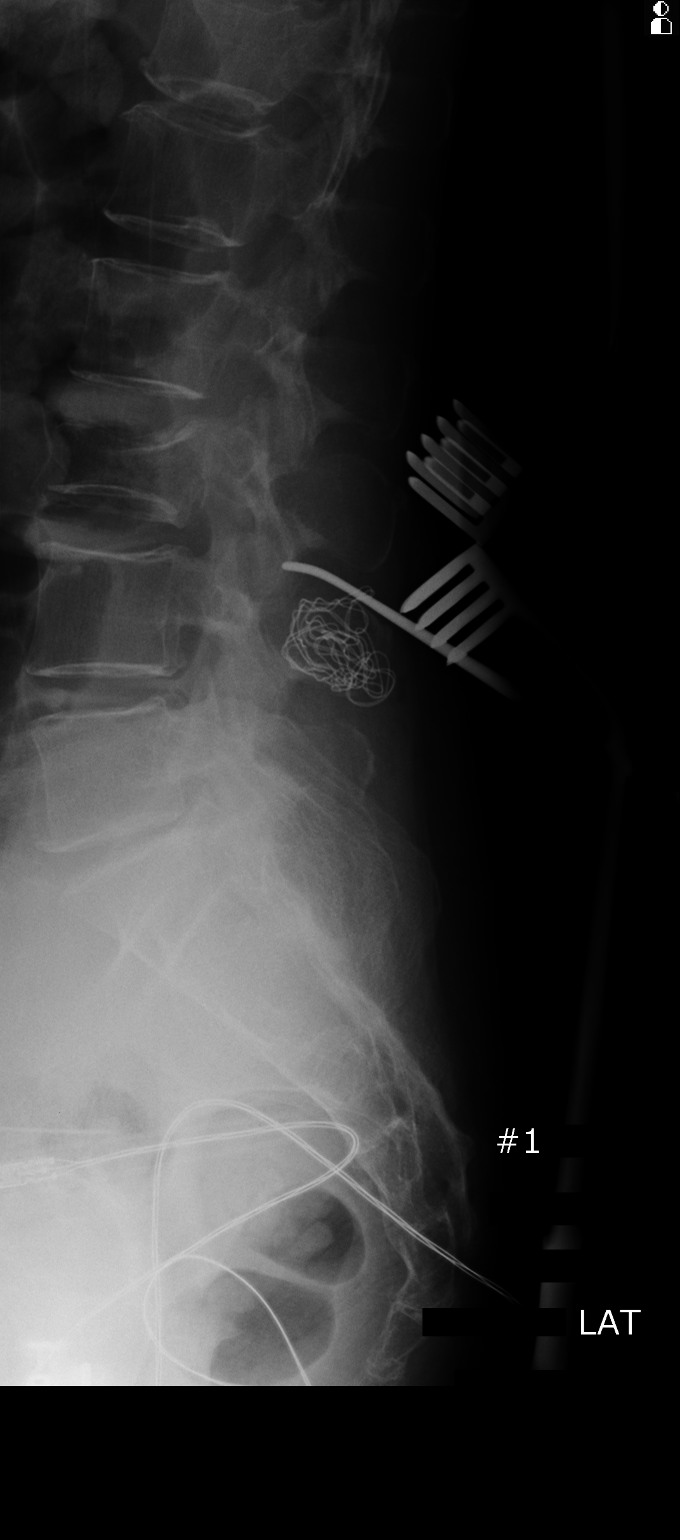

[1 of 1 positions shown; findings below may reference images not displayed]

FINDINGS: Single intraoperative cross-table lateral view of the lumbar spine,
taken at [8P] hr, is submitted. There are no available prior exams
for comparison. Surgical instrument tip projects posterior to the
L3-4 facet joints. There is an L3 compression fracture. Degenerative
disc disease at L5-S1.
IMPRESSION: 1. Intraoperative visualization with surgical instrument localized
at the L3-4 level.
2. L3 compression fracture.

## 2013-06-20 SURGERY — POSTERIOR LUMBAR FUSION 3 LEVEL
Anesthesia: General | Site: Back | Wound class: Clean

## 2013-06-20 MED ORDER — FENTANYL CITRATE 0.05 MG/ML IJ SOLN
INTRAMUSCULAR | Status: DC | PRN
  Administered 2013-06-20 (×8): 50 ug via INTRAVENOUS
  Administered 2013-06-20: 100 ug via INTRAVENOUS
  Administered 2013-06-20: 50 ug via INTRAVENOUS

## 2013-06-20 MED ORDER — DIPHENHYDRAMINE HCL 50 MG/ML IJ SOLN: 12.5000 mg | Freq: Four times a day (QID) | INTRAMUSCULAR | Status: DC | PRN

## 2013-06-20 MED ORDER — ONDANSETRON HCL 4 MG/2ML IJ SOLN
INTRAMUSCULAR | Status: DC | PRN
  Administered 2013-06-20: 4 mg via INTRAVENOUS

## 2013-06-20 MED ORDER — 0.9 % SODIUM CHLORIDE (POUR BTL) OPTIME
TOPICAL | Status: DC | PRN
  Administered 2013-06-20: 1000 mL

## 2013-06-20 MED ORDER — BACITRACIN ZINC 500 UNIT/GM EX OINT
TOPICAL_OINTMENT | CUTANEOUS | Status: DC | PRN
  Administered 2013-06-20: 1 via TOPICAL

## 2013-06-20 MED ORDER — ACETAMINOPHEN 650 MG RE SUPP: 650.0000 mg | RECTAL | Status: DC | PRN

## 2013-06-20 MED ORDER — BUPIVACAINE LIPOSOME 1.3 % IJ SUSP
20.0000 mL | INTRAMUSCULAR | Status: AC
Start: 2013-06-20 — End: 2013-06-21
  Filled 2013-06-20: qty 20

## 2013-06-20 MED ORDER — ALBUMIN HUMAN 5 % IV SOLN
INTRAVENOUS | Status: DC | PRN
  Administered 2013-06-20: 15:00:00 via INTRAVENOUS

## 2013-06-20 MED ORDER — SODIUM CHLORIDE 0.9 % IV SOLN
10.0000 mg | INTRAVENOUS | Status: DC | PRN
  Administered 2013-06-20: 5 ug/min via INTRAVENOUS

## 2013-06-20 MED ORDER — PHENYLEPHRINE HCL 10 MG/ML IJ SOLN
INTRAMUSCULAR | Status: DC | PRN
  Administered 2013-06-20: 120 ug via INTRAVENOUS
  Administered 2013-06-20: 80 ug via INTRAVENOUS
  Administered 2013-06-20: 40 ug via INTRAVENOUS
  Administered 2013-06-20: 120 ug via INTRAVENOUS
  Administered 2013-06-20: 40 ug via INTRAVENOUS

## 2013-06-20 MED ORDER — ONDANSETRON HCL 4 MG/2ML IJ SOLN
4.0000 mg | INTRAMUSCULAR | Status: DC | PRN
  Administered 2013-06-20 – 2013-06-23 (×3): 4 mg via INTRAVENOUS
  Filled 2013-06-20 (×3): qty 2

## 2013-06-20 MED ORDER — ALUM & MAG HYDROXIDE-SIMETH 200-200-20 MG/5ML PO SUSP: 30.0000 mL | Freq: Four times a day (QID) | ORAL | Status: DC | PRN

## 2013-06-20 MED ORDER — HYDROCODONE-ACETAMINOPHEN 5-325 MG PO TABS: 1.0000 | ORAL_TABLET | ORAL | Status: DC | PRN

## 2013-06-20 MED ORDER — MORPHINE SULFATE (PF) 1 MG/ML IV SOLN
INTRAVENOUS | Status: DC
  Administered 2013-06-20: 25 mg via INTRAVENOUS
  Administered 2013-06-21: 12.35 mg via INTRAVENOUS
  Administered 2013-06-21: 7.5 mg via INTRAVENOUS
  Administered 2013-06-21: 4.5 mg via INTRAVENOUS
  Administered 2013-06-21: 25 mg via INTRAVENOUS
  Administered 2013-06-21: 15 mg via INTRAVENOUS
  Administered 2013-06-22: 02:00:00 via INTRAVENOUS
  Filled 2013-06-20 (×2): qty 25

## 2013-06-20 MED ORDER — MENTHOL 3 MG MT LOZG: 1.0000 | LOZENGE | OROMUCOSAL | Status: DC | PRN

## 2013-06-20 MED ORDER — HYDROMORPHONE HCL PF 1 MG/ML IJ SOLN
INTRAMUSCULAR | Status: AC
  Filled 2013-06-20: qty 1

## 2013-06-20 MED ORDER — LACTATED RINGERS IV SOLN
INTRAVENOUS | Status: DC | PRN
  Administered 2013-06-20 (×3): via INTRAVENOUS

## 2013-06-20 MED ORDER — DIAZEPAM 5 MG PO TABS
5.0000 mg | ORAL_TABLET | Freq: Four times a day (QID) | ORAL | Status: DC | PRN
  Administered 2013-06-23: 5 mg via ORAL
  Filled 2013-06-20: qty 1

## 2013-06-20 MED ORDER — CEFAZOLIN SODIUM-DEXTROSE 2-3 GM-% IV SOLR
INTRAVENOUS | Status: AC
  Filled 2013-06-20: qty 50

## 2013-06-20 MED ORDER — LACTATED RINGERS IV SOLN
INTRAVENOUS | Status: DC
  Administered 2013-06-20: 20:00:00 via INTRAVENOUS

## 2013-06-20 MED ORDER — ONDANSETRON HCL 4 MG/2ML IJ SOLN: 4.0000 mg | Freq: Four times a day (QID) | INTRAMUSCULAR | Status: DC | PRN

## 2013-06-20 MED ORDER — GLYCOPYRROLATE 0.2 MG/ML IJ SOLN
INTRAMUSCULAR | Status: DC | PRN
  Administered 2013-06-20: .6 mg via INTRAVENOUS

## 2013-06-20 MED ORDER — OXYCODONE HCL 5 MG/5ML PO SOLN: 5.0000 mg | Freq: Once | ORAL | Status: DC | PRN

## 2013-06-20 MED ORDER — DIPHENHYDRAMINE HCL 12.5 MG/5ML PO ELIX: 12.5000 mg | ORAL_SOLUTION | Freq: Four times a day (QID) | ORAL | Status: DC | PRN

## 2013-06-20 MED ORDER — THROMBIN 20000 UNITS EX SOLR
CUTANEOUS | Status: DC | PRN
  Administered 2013-06-20: 15:00:00 via TOPICAL

## 2013-06-20 MED ORDER — MIDAZOLAM HCL 5 MG/5ML IJ SOLN
INTRAMUSCULAR | Status: DC | PRN
  Administered 2013-06-20: 2 mg via INTRAVENOUS

## 2013-06-20 MED ORDER — MORPHINE SULFATE (PF) 1 MG/ML IV SOLN
INTRAVENOUS | Status: AC
  Filled 2013-06-20: qty 25

## 2013-06-20 MED ORDER — NEOSTIGMINE METHYLSULFATE 1 MG/ML IJ SOLN
INTRAMUSCULAR | Status: DC | PRN
  Administered 2013-06-20: 4 mg via INTRAVENOUS

## 2013-06-20 MED ORDER — OXYCODONE-ACETAMINOPHEN 5-325 MG PO TABS
1.0000 | ORAL_TABLET | ORAL | Status: DC | PRN
  Administered 2013-06-22 – 2013-06-25 (×9): 2 via ORAL
  Filled 2013-06-20 (×9): qty 2

## 2013-06-20 MED ORDER — PROPOFOL 10 MG/ML IV BOLUS
INTRAVENOUS | Status: DC | PRN
  Administered 2013-06-20: 110 mg via INTRAVENOUS

## 2013-06-20 MED ORDER — VECURONIUM BROMIDE 10 MG IV SOLR
INTRAVENOUS | Status: DC | PRN
  Administered 2013-06-20: 1 mg via INTRAVENOUS
  Administered 2013-06-20: 2 mg via INTRAVENOUS
  Administered 2013-06-20: 1 mg via INTRAVENOUS

## 2013-06-20 MED ORDER — SODIUM CHLORIDE 0.9 % IJ SOLN: 9.0000 mL | INTRAMUSCULAR | Status: DC | PRN

## 2013-06-20 MED ORDER — PHENOL 1.4 % MT LIQD: 1.0000 | OROMUCOSAL | Status: DC | PRN

## 2013-06-20 MED ORDER — ROCURONIUM BROMIDE 100 MG/10ML IV SOLN
INTRAVENOUS | Status: DC | PRN
  Administered 2013-06-20: 50 mg via INTRAVENOUS

## 2013-06-20 MED ORDER — ZOLPIDEM TARTRATE 5 MG PO TABS: 5.0000 mg | ORAL_TABLET | Freq: Every evening | ORAL | Status: DC | PRN

## 2013-06-20 MED ORDER — OXYCODONE HCL 5 MG PO TABS: 5.0000 mg | ORAL_TABLET | Freq: Once | ORAL | Status: DC | PRN

## 2013-06-20 MED ORDER — ACETAMINOPHEN 325 MG PO TABS
650.0000 mg | ORAL_TABLET | ORAL | Status: DC | PRN
  Administered 2013-06-21: 650 mg via ORAL
  Filled 2013-06-20: qty 2

## 2013-06-20 MED ORDER — ARTIFICIAL TEARS OP OINT
TOPICAL_OINTMENT | OPHTHALMIC | Status: DC | PRN
  Administered 2013-06-20: 1 via OPHTHALMIC

## 2013-06-20 MED ORDER — CEFAZOLIN SODIUM-DEXTROSE 2-3 GM-% IV SOLR
INTRAVENOUS | Status: AC
  Administered 2013-06-20 (×2): 2 g via INTRAVENOUS
  Filled 2013-06-20: qty 50

## 2013-06-20 MED ORDER — NALOXONE HCL 0.4 MG/ML IJ SOLN: 0.4000 mg | INTRAMUSCULAR | Status: DC | PRN

## 2013-06-20 MED ORDER — HYDROMORPHONE HCL PF 1 MG/ML IJ SOLN
0.2500 mg | INTRAMUSCULAR | Status: DC | PRN
  Administered 2013-06-20 (×4): 0.5 mg via INTRAVENOUS

## 2013-06-20 MED ORDER — CEFAZOLIN SODIUM-DEXTROSE 2-3 GM-% IV SOLR
2.0000 g | Freq: Three times a day (TID) | INTRAVENOUS | Status: AC
Start: 2013-06-21 — End: 2013-06-21
  Administered 2013-06-21 (×2): 2 g via INTRAVENOUS
  Filled 2013-06-20 (×2): qty 50

## 2013-06-20 MED ORDER — LIDOCAINE HCL (CARDIAC) 20 MG/ML IV SOLN
INTRAVENOUS | Status: DC | PRN
  Administered 2013-06-20: 40 mg via INTRAVENOUS

## 2013-06-20 MED ORDER — DOCUSATE SODIUM 100 MG PO CAPS
100.0000 mg | ORAL_CAPSULE | Freq: Two times a day (BID) | ORAL | Status: DC
  Administered 2013-06-20 – 2013-06-26 (×11): 100 mg via ORAL
  Filled 2013-06-20 (×11): qty 1

## 2013-06-20 MED ORDER — BUPIVACAINE-EPINEPHRINE PF 0.5-1:200000 % IJ SOLN
INTRAMUSCULAR | Status: DC | PRN
  Administered 2013-06-20: 10 mL

## 2013-06-20 MED ORDER — SODIUM CHLORIDE 0.9 % IR SOLN
Status: DC | PRN
  Administered 2013-06-20: 15:00:00

## 2013-06-20 MED ORDER — BUPIVACAINE LIPOSOME 1.3 % IJ SUSP
INTRAMUSCULAR | Status: DC | PRN
  Administered 2013-06-20: 20 mL

## 2013-06-20 SURGICAL SUPPLY — 72 items
BAG DECANTER FOR FLEXI CONT (MISCELLANEOUS) ×2 IMPLANT
BENZOIN TINCTURE PRP APPL 2/3 (GAUZE/BANDAGES/DRESSINGS) ×2 IMPLANT
BLADE SURG ROTATE 9660 (MISCELLANEOUS) IMPLANT
BRUSH SCRUB EZ PLAIN DRY (MISCELLANEOUS) ×2 IMPLANT
BUR ACORN 6.0 (BURR) ×2 IMPLANT
BUR MATCHSTICK NEURO 3.0 LAGG (BURR) ×2 IMPLANT
CANISTER SUCT 3000ML (MISCELLANEOUS) ×2 IMPLANT
CAP REVERE LOCKING (Cap) ×16 IMPLANT
CONN CROSSLINK REV 38-50MM (Connector) ×2 IMPLANT
CONNECTOR CRSLINK REV 38-50MM (Connector) ×1 IMPLANT
CONT SPEC 4OZ CLIKSEAL STRL BL (MISCELLANEOUS) ×2 IMPLANT
COVER BACK TABLE 24X17X13 BIG (DRAPES) IMPLANT
DRAPE C-ARM 42X72 X-RAY (DRAPES) ×4 IMPLANT
DRAPE LAPAROTOMY 100X72X124 (DRAPES) ×2 IMPLANT
DRAPE POUCH INSTRU U-SHP 10X18 (DRAPES) ×2 IMPLANT
DRAPE PROXIMA HALF (DRAPES) ×2 IMPLANT
DRAPE SURG 17X23 STRL (DRAPES) ×8 IMPLANT
ELECT BLADE 4.0 EZ CLEAN MEGAD (MISCELLANEOUS) ×2
ELECT REM PT RETURN 9FT ADLT (ELECTROSURGICAL) ×2
ELECTRODE BLDE 4.0 EZ CLN MEGD (MISCELLANEOUS) ×1 IMPLANT
ELECTRODE REM PT RTRN 9FT ADLT (ELECTROSURGICAL) ×1 IMPLANT
EVACUATOR 1/8 PVC DRAIN (DRAIN) ×2 IMPLANT
GAUZE SPONGE 4X4 16PLY XRAY LF (GAUZE/BANDAGES/DRESSINGS) IMPLANT
GLOVE BIO SURGEON STRL SZ 6.5 (GLOVE) ×8 IMPLANT
GLOVE BIO SURGEON STRL SZ8 (GLOVE) ×2 IMPLANT
GLOVE BIO SURGEON STRL SZ8.5 (GLOVE) ×4 IMPLANT
GLOVE BIOGEL PI IND STRL 6.5 (GLOVE) ×2 IMPLANT
GLOVE BIOGEL PI IND STRL 7.0 (GLOVE) ×2 IMPLANT
GLOVE BIOGEL PI IND STRL 8.5 (GLOVE) ×1 IMPLANT
GLOVE BIOGEL PI INDICATOR 6.5 (GLOVE) ×2
GLOVE BIOGEL PI INDICATOR 7.0 (GLOVE) ×2
GLOVE BIOGEL PI INDICATOR 8.5 (GLOVE) ×1
GLOVE EXAM NITRILE LRG STRL (GLOVE) IMPLANT
GLOVE EXAM NITRILE MD LF STRL (GLOVE) IMPLANT
GLOVE EXAM NITRILE XL STR (GLOVE) IMPLANT
GLOVE EXAM NITRILE XS STR PU (GLOVE) IMPLANT
GLOVE SS BIOGEL STRL SZ 6.5 (GLOVE) ×3 IMPLANT
GLOVE SS BIOGEL STRL SZ 8 (GLOVE) ×2 IMPLANT
GLOVE SUPERSENSE BIOGEL SZ 6.5 (GLOVE) ×3
GLOVE SUPERSENSE BIOGEL SZ 8 (GLOVE) ×2
GOWN BRE IMP SLV AUR LG STRL (GOWN DISPOSABLE) ×6 IMPLANT
GOWN BRE IMP SLV AUR XL STRL (GOWN DISPOSABLE) ×6 IMPLANT
GOWN STRL REIN 2XL LVL4 (GOWN DISPOSABLE) IMPLANT
KIT BASIN OR (CUSTOM PROCEDURE TRAY) ×2 IMPLANT
KIT INFUSE SMALL (Orthopedic Implant) ×2 IMPLANT
KIT ROOM TURNOVER OR (KITS) ×2 IMPLANT
MARKER SKIN DUAL TIP RULER LAB (MISCELLANEOUS) ×2 IMPLANT
NEEDLE HYPO 21X1.5 SAFETY (NEEDLE) ×4 IMPLANT
NEEDLE HYPO 22GX1.5 SAFETY (NEEDLE) ×2 IMPLANT
NS IRRIG 1000ML POUR BTL (IV SOLUTION) ×2 IMPLANT
PACK FOAM VITOSS 10CC (Orthopedic Implant) ×4 IMPLANT
PACK LAMINECTOMY NEURO (CUSTOM PROCEDURE TRAY) ×2 IMPLANT
PAD ARMBOARD 7.5X6 YLW CONV (MISCELLANEOUS) ×8 IMPLANT
PATTIES SURGICAL .5 X1 (DISPOSABLE) IMPLANT
PATTIES SURGICAL 1X1 (DISPOSABLE) IMPLANT
ROD REVERE 6.35 CURVED 150MM (Rod) ×4 IMPLANT
SCREW REVERE 6.35 6.5MMX45 (Screw) ×16 IMPLANT
SPONGE GAUZE 4X4 12PLY (GAUZE/BANDAGES/DRESSINGS) ×2 IMPLANT
SPONGE LAP 4X18 X RAY DECT (DISPOSABLE) IMPLANT
SPONGE NEURO XRAY DETECT 1X3 (DISPOSABLE) IMPLANT
SPONGE SURGIFOAM ABS GEL 100 (HEMOSTASIS) ×2 IMPLANT
STRIP CLOSURE SKIN 1/2X4 (GAUZE/BANDAGES/DRESSINGS) ×4 IMPLANT
SUT VIC AB 1 CT1 18XBRD ANBCTR (SUTURE) ×2 IMPLANT
SUT VIC AB 1 CT1 8-18 (SUTURE) ×2
SUT VIC AB 2-0 CP2 18 (SUTURE) ×6 IMPLANT
SYR 20CC LL (SYRINGE) ×4 IMPLANT
SYR 20ML ECCENTRIC (SYRINGE) ×2 IMPLANT
TAPE CLOTH SURG 4X10 WHT LF (GAUZE/BANDAGES/DRESSINGS) ×2 IMPLANT
TOWEL OR 17X24 6PK STRL BLUE (TOWEL DISPOSABLE) ×2 IMPLANT
TOWEL OR 17X26 10 PK STRL BLUE (TOWEL DISPOSABLE) ×2 IMPLANT
TRAY FOLEY CATH 14FRSI W/METER (CATHETERS) ×2 IMPLANT
WATER STERILE IRR 1000ML POUR (IV SOLUTION) ×2 IMPLANT

## 2013-06-20 NOTE — Anesthesia Postprocedure Evaluation (Signed)
Anesthesia Post Note  Patient: Teresa Sherman  Procedure(s) Performed: Procedure(s) (LRB): Open Reduction Lumbar three fracture, Posterior Arthrodesis Lumbar one-two, Lumbar two-three, Lumbar three-four, Lumbar four-five, Posterior Segmental Instrumentation Lumbar one-Lumbar five (N/A)  Anesthesia type: General  Patient location: PACU  Post pain: Pain level controlled and Adequate analgesia  Post assessment: Post-op Vital signs reviewed, Patient's Cardiovascular Status Stable, Respiratory Function Stable, Patent Airway and Pain level controlled  Last Vitals:  Filed Vitals:   06/20/13 1806  BP:   Pulse: 106  Temp:   Resp: 12    Post vital signs: Reviewed and stable  Level of consciousness: awake, alert  and oriented  Complications: No apparent anesthesia complications

## 2013-06-20 NOTE — Progress Notes (Signed)
Patient ID: Teresa Sherman, female   DOB: November 26, 1945, 67 y.o.   MRN: [id number] Subjective:  The patient is alert and pleasant. She wants to proceed with surgery. She complains of back pain.  Objective: Vital signs in last 24 hours: Temp:  [97.6 F (36.4 C)-98.1 F (36.7 C)] 97.7 F (36.5 C) (10/29 0948) Pulse Rate:  [95-114] 107 (10/29 0948) Resp:  [18-20] 18 (10/29 0948) BP: (107-118)/(73-82) 107/73 mmHg (10/29 0948) SpO2:  [93 %-96 %] 93 % (10/29 0948)  Intake/Output from previous day: 10/28 0701 - 10/29 0700 In: 240 [P.O.:240] Out: -  Intake/Output this shift:    Physical exam patient is alert and oriented. Her lower extremity strength is grossly normal.  Lab Results:  Recent Labs  06/19/13 0950  WBC 6.3  HGB 14.4  HCT 40.8  PLT 202   BMET  Recent Labs  06/19/13 0950  NA 135  K 3.6  CL 99  CO2 24  GLUCOSE 83  BUN 21  CREATININE 0.55  CALCIUM 9.0    Studies/Results: Dg Chest Port 1 View  06/19/2013   CLINICAL DATA:  Preop lumbar surgery.  EXAM: PORTABLE CHEST - 1 VIEW  COMPARISON:  None.  FINDINGS: The heart size and mediastinal contours are within normal limits. Left lung is clear. Linear density seen in right lung base concerning for subsegmental atelectasis or scarring. The visualized skeletal structures are unremarkable.  IMPRESSION: Mild right basilar subsegmental atelectasis or scarring. No other abnormality seen in the chest.   Electronically Signed   By: Roque Lias M.D.   On: 06/19/2013 08:38    Assessment/Plan: L3 fracture, spinal stenosis, lumbago: I discussed situation again with the patient. I have answered all her questions regarding surgery. She has weighed the risks, benefits, and alternatives surgery and was to proceed with a lumbar decompression, instrumentation, and fusion.  LOS: 7 days     , D 06/20/2013, 1:06 PM

## 2013-06-20 NOTE — Preoperative (Signed)
Beta Blockers   Reason not to administer Beta Blockers:Not Applicable 

## 2013-06-20 NOTE — Anesthesia Procedure Notes (Signed)
Procedure Name: Intubation Date/Time: 06/20/2013 1:58 PM Performed by: Lovie Chol Pre-anesthesia Checklist: Patient identified, Emergency Drugs available, Suction available, Patient being monitored and Timeout performed Patient Re-evaluated:Patient Re-evaluated prior to inductionOxygen Delivery Method: Circle system utilized Preoxygenation: Pre-oxygenation with 100% oxygen Intubation Type: IV induction Ventilation: Mask ventilation without difficulty Laryngoscope Size: Miller and 2 Grade View: Grade I Tube type: Oral Tube size: 7.0 mm Number of attempts: 1 Airway Equipment and Method: Stylet Placement Confirmation: ETT inserted through vocal cords under direct vision,  positive ETCO2,  CO2 detector and breath sounds checked- equal and bilateral Secured at: 20 cm Tube secured with: Tape Dental Injury: Teeth and Oropharynx as per pre-operative assessment

## 2013-06-20 NOTE — Progress Notes (Signed)
Patient ID: Teresa Sherman, female   DOB: 06/06/1946, 67 y.o.   MRN: [id number] Subjective:  The patient is somnolent but easily arousable. She is in no apparent distress.  Objective: Vital signs in last 24 hours: Temp:  [97.7 F (36.5 C)-98.1 F (36.7 C)] 97.7 F (36.5 C) (10/29 0948) Pulse Rate:  [95-114] 107 (10/29 0948) Resp:  [18] 18 (10/29 0948) BP: (107-116)/(73-82) 107/73 mmHg (10/29 0948) SpO2:  [93 %-96 %] 93 % (10/29 0948)  Intake/Output from previous day: 10/28 0701 - 10/29 0700 In: 240 [P.O.:240] Out: -  Intake/Output this shift: Total I/O In: 3150 [I.V.:2900; IV Piggyback:250] Out: 420 [Urine:170; Blood:250]  Physical exam patient is somnolent but easily arousable. She is moving her lower extremities well.  Lab Results:  Recent Labs  06/19/13 0950  WBC 6.3  HGB 14.4  HCT 40.8  PLT 202   BMET  Recent Labs  06/19/13 0950  NA 135  K 3.6  CL 99  CO2 24  GLUCOSE 83  BUN 21  CREATININE 0.55  CALCIUM 9.0    Studies/Results: Dg Lumbar Spine 2-3 Views  06/20/2013   CLINICAL DATA:  L1-5 decompression with instrumentation and fusion  EXAM: DG C-ARM 1-60 MIN; LUMBAR SPINE - 2-3 VIEW  COMPARISON:  Intraoperative radiographs obtained earlier today at 14:27 p.m.  FINDINGS: A total of 6 intraoperative spot views demonstrate placement of bilateral pedicle screws at L1, L2-L4 and L5. There is a compression fracture at L3. No evidence of hardware complication involving the pedicle screws. No rods are visible on these spot radiographs.  IMPRESSION: Intraoperative radiographs obtained during L1-L5 decompression and posterior fusion.   Electronically Signed   By: Malachy Moan M.D.   On: 06/20/2013 17:34   Dg Lumbar Spine 1 View  06/20/2013   CLINICAL DATA:  L2-5 PLIF.  EXAM: LUMBAR SPINE - 1 VIEW  COMPARISON:  None.  FINDINGS: Single intraoperative cross-table lateral view of the lumbar spine, taken at 1430 hr, is submitted. There are no available prior exams  for comparison. Surgical instrument tip projects posterior to the L3-4 facet joints. There is an L3 compression fracture. Degenerative disc disease at L5-S1.  IMPRESSION: 1. Intraoperative visualization with surgical instrument localized at the L3-4 level. 2. L3 compression fracture.   Electronically Signed   By: Leanna Battles M.D.   On: 06/20/2013 16:49   Dg Chest Port 1 View  06/19/2013   CLINICAL DATA:  Preop lumbar surgery.  EXAM: PORTABLE CHEST - 1 VIEW  COMPARISON:  None.  FINDINGS: The heart size and mediastinal contours are within normal limits. Left lung is clear. Linear density seen in right lung base concerning for subsegmental atelectasis or scarring. The visualized skeletal structures are unremarkable.  IMPRESSION: Mild right basilar subsegmental atelectasis or scarring. No other abnormality seen in the chest.   Electronically Signed   By: Roque Lias M.D.   On: 06/19/2013 08:38   Dg C-arm 1-60 Min  06/20/2013   CLINICAL DATA:  L1-5 decompression with instrumentation and fusion  EXAM: DG C-ARM 1-60 MIN; LUMBAR SPINE - 2-3 VIEW  COMPARISON:  Intraoperative radiographs obtained earlier today at 14:27 p.m.  FINDINGS: A total of 6 intraoperative spot views demonstrate placement of bilateral pedicle screws at L1, L2-L4 and L5. There is a compression fracture at L3. No evidence of hardware complication involving the pedicle screws. No rods are visible on these spot radiographs.  IMPRESSION: Intraoperative radiographs obtained during L1-L5 decompression and posterior fusion.   Electronically Signed  By: Malachy Moan M.D.   On: 06/20/2013 17:34    Assessment/Plan: The patient is doing well.  LOS: 7 days     , D 06/20/2013, 5:56 PM
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
## 2013-06-20 NOTE — Anesthesia Preprocedure Evaluation (Addendum)
Anesthesia Evaluation  Patient identified by MRN, date of birth, ID band Patient awake    Reviewed: Allergy & Precautions, H&P , NPO status , Patient's Chart, lab work & pertinent test results  Airway Mallampati: II TM Distance: >3 FB Neck ROM: Full    Dental no notable dental hx. (+) Teeth Intact and Dental Advisory Given   Pulmonary neg pulmonary ROS,  breath sounds clear to auscultation  Pulmonary exam normal       Cardiovascular negative cardio ROS  Rhythm:Regular Rate:Normal     Neuro/Psych negative neurological ROS  negative psych ROS   GI/Hepatic negative GI ROS, Neg liver ROS,   Endo/Other  negative endocrine ROS  Renal/GU negative Renal ROS  negative genitourinary   Musculoskeletal   Abdominal   Peds  Hematology negative hematology ROS (+)   Anesthesia Other Findings   Reproductive/Obstetrics negative OB ROS                           Anesthesia Physical Anesthesia Plan  ASA: I  Anesthesia Plan: General   Post-op Pain Management:    Induction: Intravenous  Airway Management Planned: Oral ETT  Additional Equipment:   Intra-op Plan:   Post-operative Plan: Extubation in OR  Informed Consent: I have reviewed the patients History and Physical, chart, labs and discussed the procedure including the risks, benefits and alternatives for the proposed anesthesia with the patient or authorized representative who has indicated his/her understanding and acceptance.   Dental advisory given  Plan Discussed with: CRNA  Anesthesia Plan Comments:         Anesthesia Quick Evaluation  

## 2013-06-20 NOTE — Transfer of Care (Signed)
Immediate Anesthesia Transfer of Care Note  Patient: Teresa Sherman  Procedure(s) Performed: Procedure(s) with comments: Open Reduction Lumbar three fracture, Posterior Arthrodesis Lumbar one-two, Lumbar two-three, Lumbar three-four, Lumbar four-five, Posterior Segmental Instrumentation Lumbar one-Lumbar five (N/A) - Open Reduction Lumbar three fracture, Posterior Arthrodesis Lumbar one-two, Lumbar two-three, Lumbar three-four, Lumbar four-five, Posterior Segmental Instrumentation Lumbar one-Lumbar five  Patient Location: PACU  Anesthesia Type:General  Level of Consciousness: awake and alert   Airway & Oxygen Therapy: Patient Spontanous Breathing and Patient connected to nasal cannula oxygen  Post-op Assessment: Report given to PACU RN and Post -op Vital signs reviewed and stable  Post vital signs: Reviewed and stable  Complications: No apparent anesthesia complications

## 2013-06-21 LAB — CBC
Hemoglobin: 11 g/dL — ABNORMAL LOW (ref 12.0–15.0)
MCH: 30.8 pg (ref 26.0–34.0)
MCHC: 34.3 g/dL (ref 30.0–36.0)
MCV: 89.9 fL (ref 78.0–100.0)
Platelets: 171 10*3/uL (ref 150–400)
RDW: 12.8 % (ref 11.5–15.5)

## 2013-06-21 LAB — BASIC METABOLIC PANEL
Creatinine, Ser: 0.6 mg/dL (ref 0.50–1.10)
GFR calc Af Amer: >90 mL/min (ref >90)
GFR calc non Af Amer: >90 mL/min (ref >90)
Glucose, Bld: 116 mg/dL — ABNORMAL HIGH (ref 70–99)
Potassium: 4.1 mEq/L (ref 3.5–5.1)
Sodium: 131 mEq/L — ABNORMAL LOW (ref 135–145)

## 2013-06-21 NOTE — Evaluation (Signed)
Physical Therapy Evaluation Patient Details Name: Teresa Sherman MRN: [id number] DOB: April 12, 1946 Today's Date: 06/21/2013 Time: 1308-6578 PT Time Calculation (min): 29 min  PT Assessment / Plan / Recommendation History of Present Illness  pt presents with L3 compression fx who failed conservative treatment and is now post L1-5 Fusion.    Clinical Impression  Pt needs max cueing and re-direction for participation and safety.  Pt becomes anxious easily and needs slow firm cueing.  Feel SNF is safest D/C plan for pt at this time.  Will continue to follow.      PT Assessment  Patient needs continued PT services    Follow Up Recommendations  SNF    Does the patient have the potential to tolerate intense rehabilitation      Barriers to Discharge Decreased caregiver support      Equipment Recommendations  Rolling walker with 5" wheels    Recommendations for Other Services OT consult   Frequency Min 5X/week    Precautions / Restrictions Precautions Precautions: Back Precaution Booklet Issued: No Required Braces or Orthoses: Spinal Brace Spinal Brace: Lumbar corset;Applied in sitting position Restrictions Weight Bearing Restrictions: No   Pertinent Vitals/Pain 10/10.  Encouraged pt to push PCA.        Mobility  Bed Mobility Bed Mobility: Rolling Left;Left Sidelying to Sit;Sitting - Scoot to Edge of Bed Rolling Left: 3: Mod assist Left Sidelying to Sit: 1: +2 Total assist Left Sidelying to Sit: Patient Percentage: 40% Details for Bed Mobility Assistance: Max step-by-step cueing for technique and safety.  pt very anxious with any movement.   Transfers Transfers: Sit to Stand;Stand to Sit;Stand Pivot Transfers Sit to Stand: 1: +2 Total assist;From bed Sit to Stand: Patient Percentage: 50% Stand to Sit: 1: +2 Total assist;To chair/3-in-1 Stand to Sit: Patient Percentage: 30% Stand Pivot Transfers: 1: +2 Total assist Stand Pivot Transfers: Patient Percentage:  40% Details for Transfer Assistance: Max step-by-step cueing for movement through transfer and for safety.   Ambulation/Gait Ambulation/Gait Assistance: Not tested (comment) Stairs: No Wheelchair Mobility Wheelchair Mobility: No    Exercises     PT Diagnosis: Difficulty walking;Generalized weakness;Acute pain  PT Problem List: Decreased strength;Decreased activity tolerance;Decreased balance;Decreased mobility;Decreased knowledge of use of DME;Decreased safety awareness;Decreased knowledge of precautions;Pain PT Treatment Interventions: DME instruction;Gait training;Stair training;Functional mobility training;Therapeutic activities;Therapeutic exercise;Balance training;Neuromuscular re-education;Patient/family education     PT Goals(Current goals can be found in the care plan section) Acute Rehab PT Goals Patient Stated Goal: none specifically stated PT Goal Formulation: With patient Time For Goal Achievement: 07/05/13 Potential to Achieve Goals: Fair  Visit Information  Last PT Received On: 06/21/13 Assistance Needed: +2 History of Present Illness: pt presents with L3 compression fx who failed conservative treatment and is now post L1-5 Fusion.         Prior Functioning  Home Living Family/patient expects to be discharged to:: Skilled nursing facility Living Arrangements: Alone Available Help at Discharge: Family;Available PRN/intermittently Type of Home: House Home Access: Stairs to enter Entrance Stairs-Number of Steps: 1 Entrance Stairs-Rails: None Home Layout: One level Home Equipment: Shower seat Additional Comments: sister lives in same neighborhood but has an elderly husband that she has to be primary caregiver, so (A) at d/c is limited and sort periods of time, driving,  Prior Function Level of Independence: Independent Communication Communication: No difficulties Dominant Hand: Right    Cognition  Cognition Arousal/Alertness: Awake/alert Behavior During  Therapy: WFL for tasks assessed/performed Overall Cognitive Status: Within Functional Limits for tasks assessed  Extremity/Trunk Assessment Upper Extremity Assessment Upper Extremity Assessment: Defer to OT evaluation Lower Extremity Assessment Lower Extremity Assessment: Generalized weakness Cervical / Trunk Assessment Cervical / Trunk Assessment: Normal   Balance Balance Balance Assessed: Yes Static Sitting Balance Static Sitting - Balance Support: Bilateral upper extremity supported;Feet supported Static Sitting - Level of Assistance: 4: Min assist Static Sitting - Comment/# of Minutes: pt tends to lean posteriorly 2/2 fear and anxiety.    End of Session PT - End of Session Equipment Utilized During Treatment: Gait belt;Back brace Activity Tolerance: Patient limited by pain (Limited by anxiety) Patient left: in chair;with call bell/phone within reach Nurse Communication: Mobility status (Need for a bath)  GP     , Alison Murray, PT [phone] 06/21/2013, 3:01 PM

## 2013-06-21 NOTE — Progress Notes (Signed)
Patient ID: Teresa Sherman, female   DOB: December 15, 1945, 67 y.o.   MRN: [id number] Subjective:  The patient is alert and pleasant. She complains of back pain.  Objective: Vital signs in last 24 hours: Temp:  [97.1 F (36.2 C)-98.8 F (37.1 C)] 98.8 F (37.1 C) (10/30 0607) Pulse Rate:  [101-115] 115 (10/30 0607) Resp:  [10-21] 18 (10/30 0607) BP: (102-129)/(64-82) 116/72 mmHg (10/30 0607) SpO2:  [93 %-99 %] 97 % (10/30 0607)  Intake/Output from previous day: 10/29 0701 - 10/30 0700 In: 4250 [P.O.:360; I.V.:3640; IV Piggyback:250] Out: 1820 [Urine:1370; Drains:200; Blood:250] Intake/Output this shift:    Physical exam the patient is alert and oriented. Her strength is normal in her lower extremities.  Lab Results:  Recent Labs  06/19/13 0950 06/21/13 0505  WBC 6.3 7.6  HGB 14.4 11.0*  HCT 40.8 32.1*  PLT 202 171   BMET  Recent Labs  06/19/13 0950 06/21/13 0505  NA 135 131*  K 3.6 4.1  CL 99 96  CO2 24 28  GLUCOSE 83 116*  BUN 21 11  CREATININE 0.55 0.60  CALCIUM 9.0 8.5    Studies/Results: Dg Lumbar Spine 2-3 Views  06/20/2013   CLINICAL DATA:  L1-5 decompression with instrumentation and fusion  EXAM: DG C-ARM 1-60 MIN; LUMBAR SPINE - 2-3 VIEW  COMPARISON:  Intraoperative radiographs obtained earlier today at 14:27 p.m.  FINDINGS: A total of 6 intraoperative spot views demonstrate placement of bilateral pedicle screws at L1, L2-L4 and L5. There is a compression fracture at L3. No evidence of hardware complication involving the pedicle screws. No rods are visible on these spot radiographs.  IMPRESSION: Intraoperative radiographs obtained during L1-L5 decompression and posterior fusion.   Electronically Signed   By: Malachy Moan M.D.   On: 06/20/2013 17:34   Dg Lumbar Spine 1 View  06/20/2013   CLINICAL DATA:  L2-5 PLIF.  EXAM: LUMBAR SPINE - 1 VIEW  COMPARISON:  None.  FINDINGS: Single intraoperative cross-table lateral view of the lumbar spine, taken at  1430 hr, is submitted. There are no available prior exams for comparison. Surgical instrument tip projects posterior to the L3-4 facet joints. There is an L3 compression fracture. Degenerative disc disease at L5-S1.  IMPRESSION: 1. Intraoperative visualization with surgical instrument localized at the L3-4 level. 2. L3 compression fracture.   Electronically Signed   By: Leanna Battles M.D.   On: 06/20/2013 16:49   Dg Chest Port 1 View  06/19/2013   CLINICAL DATA:  Preop lumbar surgery.  EXAM: PORTABLE CHEST - 1 VIEW  COMPARISON:  None.  FINDINGS: The heart size and mediastinal contours are within normal limits. Left lung is clear. Linear density seen in right lung base concerning for subsegmental atelectasis or scarring. The visualized skeletal structures are unremarkable.  IMPRESSION: Mild right basilar subsegmental atelectasis or scarring. No other abnormality seen in the chest.   Electronically Signed   By: Roque Lias M.D.   On: 06/19/2013 08:38   Dg C-arm 1-60 Min  06/20/2013   CLINICAL DATA:  L1-5 decompression with instrumentation and fusion  EXAM: DG C-ARM 1-60 MIN; LUMBAR SPINE - 2-3 VIEW  COMPARISON:  Intraoperative radiographs obtained earlier today at 14:27 p.m.  FINDINGS: A total of 6 intraoperative spot views demonstrate placement of bilateral pedicle screws at L1, L2-L4 and L5. There is a compression fracture at L3. No evidence of hardware complication involving the pedicle screws. No rods are visible on these spot radiographs.  IMPRESSION: Intraoperative radiographs obtained  during L1-L5 decompression and posterior fusion.   Electronically Signed   By: Malachy Moan M.D.   On: 06/20/2013 17:34    Assessment/Plan:  postop day 1: We will mobilize the patient with PT and OT. I'll plan to discontinue her PCA and Hemovac tomorrow.   LOS: 8 days     , D 06/21/2013, 7:49 AM

## 2013-06-21 NOTE — Progress Notes (Signed)
OT Cancellation Note  Patient Details Name: [name] MRN: [id number] DOB: December 07, 1945   Cancelled Treatment:     Reason Evaluation/Treatment not completed: Cancel medical. Pt w/ medical issues prohibiting therapy. Pt reports that she cannot participate in assessment secondary to 10/10 low back pain. Note pt using PCA for pain control. Will f/u as able.  Roselie Awkward Dixon 06/21/2013, 11:49 AM

## 2013-06-22 MED FILL — Heparin Sodium (Porcine) Inj 1000 Unit/ML: INTRAMUSCULAR | Qty: 30 | Status: AC

## 2013-06-22 MED FILL — Sodium Chloride IV Soln 0.9%: INTRAVENOUS | Qty: 1000 | Status: AC

## 2013-06-22 NOTE — Progress Notes (Signed)
Physical Therapy Treatment Patient Details Name: Teresa Sherman MRN: [id number] DOB: 01-15-1946 Today's Date: 06/22/2013 Time: 4098-1191 PT Time Calculation (min): 19 min  PT Assessment / Plan / Recommendation  History of Present Illness pt presents with L3 compression fx who failed conservative treatment and is now post L1-5 Fusion.     PT Comments   Pt anxious about mobility and pain.  Pt needs gentle, but firm cueing and has made progress today.  Will continue to follow.    Follow Up Recommendations  SNF     Does the patient have the potential to tolerate intense rehabilitation     Barriers to Discharge        Equipment Recommendations  Rolling walker with 5" wheels    Recommendations for Other Services    Frequency Min 5X/week   Progress towards PT Goals Progress towards PT goals: Progressing toward goals  Plan Current plan remains appropriate    Precautions / Restrictions Precautions Precautions: Fall;Back Precaution Booklet Issued: No Precaution Comments: educated in back precautions Required Braces or Orthoses: Spinal Brace Spinal Brace: Lumbar corset;Applied in sitting position Restrictions Weight Bearing Restrictions: No   Pertinent Vitals/Pain 10/10 with bed mobility.  Premedicated per pt.      Mobility  Bed Mobility Bed Mobility: Rolling Left;Left Sidelying to Sit;Sitting - Scoot to Edge of Bed Rolling Left: 4: Min assist;With rail Left Sidelying to Sit: 2: Max assist Sitting - Scoot to [place] of Bed: 2: Max assist Details for Bed Mobility Assistance: Max step-by-step cueing for technique and safety.  pt very anxious with any movement.   Transfers Transfers: Sit to Stand;Stand to Sit;Stand Pivot Transfers Sit to Stand: 1: +2 Total assist;With upper extremity assist;From bed Sit to Stand: Patient Percentage: 50% Stand to Sit: 4: Min assist;With upper extremity assist;To chair/3-in-1;With armrests Stand Pivot Transfers: 1: +2 Total assist Stand Pivot  Transfers: Patient Percentage: 50% Details for Transfer Assistance: Step-by-step cueing for safe technique and attending to task.  pt also needs cueing to relax and follow directions.   Ambulation/Gait Ambulation/Gait Assistance: 1: +2 Total assist Ambulation/Gait: Patient Percentage: 70% Ambulation Distance (Feet): 10 Feet Assistive device: 2 person hand held assist Ambulation/Gait Assistance Details: cues and encouragement to attempt ambulating.  pt anxious and needs reassurance.   Gait Pattern: Step-through pattern;Decreased stride length Stairs: No Wheelchair Mobility Wheelchair Mobility: No    Exercises     PT Diagnosis:    PT Problem List:   PT Treatment Interventions:     PT Goals (current goals can now be found in the care plan section) Acute Rehab PT Goals Patient Stated Goal: none specifically stated Time For Goal Achievement: 07/05/13 Potential to Achieve Goals: Fair  Visit Information  Last PT Received On: 06/22/13 Assistance Needed: +2 History of Present Illness: pt presents with L3 compression fx who failed conservative treatment and is now post L1-5 Fusion.      Subjective Data  Patient Stated Goal: none specifically stated   Cognition  Cognition Arousal/Alertness: Awake/alert Behavior During Therapy: WFL for tasks assessed/performed Overall Cognitive Status: Within Functional Limits for tasks assessed    Balance  Balance Balance Assessed: Yes Static Sitting Balance Static Sitting - Balance Support: Bilateral upper extremity supported;Feet supported Static Sitting - Level of Assistance: 4: Min assist Static Sitting - Comment/# of Minutes: pt again leaning posteriorly when coming to sit on EOB 2/2 anxiety.   Static Standing Balance Static Standing - Balance Support: Bilateral upper extremity supported Static Standing - Level of Assistance: 4:  Min assist  End of Session PT - End of Session Equipment Utilized During Treatment: Gait belt;Back  brace Activity Tolerance: Patient tolerated treatment well Patient left: in chair;with call bell/phone within reach Nurse Communication: Mobility status   GP     Sunny Schlein, [place] [phone] 06/22/2013, 2:17 PM

## 2013-06-22 NOTE — Evaluation (Signed)
Occupational Therapy Evaluation Patient Details Name: Teresa Sherman MRN: [id number] DOB: 1945/11/03 Today's Date: 06/22/2013 Time: 4098-1191 OT Time Calculation (min): 38 min  OT Assessment / Plan / Recommendation History of present illness pt presents with L3 compression fx who failed conservative treatment and is now post L1-5 Fusion.     Clinical Impression   Pt limited by pain and fear of moving.  Needs step by step directions for mobility, but demonstrated ability to transfer with mod assist with RW.  Dependent in bathing, dressing, toileting, and some aspects of grooming.  Will need a period of post acute rehab upon d/c.  Will follow acutely.   OT Assessment  Patient needs continued OT Services    Follow Up Recommendations  SNF    Barriers to Discharge Decreased caregiver support    Equipment Recommendations       Recommendations for Other Services    Frequency  Min 2X/week    Precautions / Restrictions Precautions Precautions: Fall;Back Precaution Booklet Issued: No Precaution Comments: educated in back precautions Required Braces or Orthoses: Spinal Brace Spinal Brace: Lumbar corset;Applied in sitting position Restrictions Weight Bearing Restrictions: No   Pertinent Vitals/Pain 5/10 back, repositioned, premedicated    ADL  Eating/Feeding: Set up Where Assessed - Eating/Feeding: Chair Grooming: Wash/dry hands;Wash/dry face;Set up;Brushing hair;+1 Total assistance Where Assessed - Grooming: Supported sitting Upper Body Bathing: Moderate assistance Where Assessed - Upper Body Bathing: Unsupported sitting Lower Body Bathing: +1 Total assistance Where Assessed - Lower Body Bathing: Unsupported sitting;Supported sit to stand Upper Body Dressing: Moderate assistance Where Assessed - Upper Body Dressing: Unsupported sitting Lower Body Dressing: +1 Total assistance Where Assessed - Lower Body Dressing: Unsupported sitting;Supported sit to stand Toilet Transfer:  Moderate assistance Toilet Transfer Method: Stand pivot Acupuncturist: Bedside commode Toileting - Clothing Manipulation and Hygiene: +1 Total assistance Where Assessed - Toileting Clothing Manipulation and Hygiene: Standing Equipment Used: Gait belt;Rolling walker;Back brace Transfers/Ambulation Related to ADLs: verbal cues for technique and hand placement ADL Comments: Pt limited by back pain and reluctance to move.    OT Diagnosis: Generalized weakness;Acute pain  OT Problem List: Decreased strength;Decreased activity tolerance;Impaired balance (sitting and/or standing);Decreased safety awareness;Decreased knowledge of use of DME or AE;Decreased knowledge of precautions;Pain OT Treatment Interventions: Self-care/ADL training;DME and/or AE instruction;Therapeutic activities;Patient/family education;Balance training   OT Goals(Current goals can be found in the care plan section) Acute Rehab OT Goals Patient Stated Goal: none specifically stated OT Goal Formulation: With patient Time For Goal Achievement: 07/06/13 Potential to Achieve Goals: Good ADL Goals Pt Will Perform Grooming: with min assist;standing Pt Will Transfer to Toilet: with min assist;ambulating;bedside commode Pt Will Perform Toileting - Clothing Manipulation and hygiene: with min assist Additional ADL Goal #1: Pt will state 3/3 back precautions.  Visit Information  Last OT Received On: 06/22/13 Assistance Needed: +2 (+1 transfer) History of Present Illness: pt presents with L3 compression fx who failed conservative treatment and is now post L1-5 Fusion.         Prior Functioning     Home Living Family/patient expects to be discharged to:: Skilled nursing facility Living Arrangements: Alone Available Help at Discharge: Family;Available PRN/intermittently Type of Home: House Home Access: Stairs to enter Entrance Stairs-Number of Steps: 1 Entrance Stairs-Rails: None Home Layout: One level Home  Equipment: Shower seat Additional Comments: sister lives in same neighborhood but has an elderly husband that she has to be primary caregiver, so (A) at d/c is limited and sort periods of time, driving,  Prior Function Level of Independence: Independent Communication Communication: No difficulties Dominant Hand: Right         Vision/Perception Vision - History Baseline Vision: Wears glasses all the time Patient Visual Report: No change from baseline   Cognition  Cognition Arousal/Alertness: Awake/alert Behavior During Therapy: WFL for tasks assessed/performed Overall Cognitive Status: Within Functional Limits for tasks assessed    Extremity/Trunk Assessment Upper Extremity Assessment Upper Extremity Assessment: Generalized weakness Lower Extremity Assessment Lower Extremity Assessment: Defer to PT evaluation Cervical / Trunk Assessment Cervical / Trunk Assessment: Normal     Mobility Bed Mobility Bed Mobility: Not assessed (pt received in chair) Transfers Transfers: Sit to Stand;Stand to Sit Sit to Stand: 3: Mod assist;With upper extremity assist Stand to Sit: 4: Min assist;With upper extremity assist Details for Transfer Assistance: verbal cues for hand placement and technique, assist for ascent and descent     Exercise     Balance Balance Balance Assessed: Yes Static Standing Balance Static Standing - Balance Support: Bilateral upper extremity supported Static Standing - Level of Assistance: 4: Min assist   End of Session OT - End of Session Activity Tolerance: Patient limited by pain Patient left: in chair;with call bell/phone within reach  GO     Evern Bio 06/22/2013, 1:26 PM [phone]

## 2013-06-22 NOTE — Progress Notes (Signed)
Patient ID: Teresa Sherman, female   DOB: 1946/07/17, 67 y.o.   MRN: [id number] Subjective:  The patient is alert and pleasant. She looks better. She has no complaints.  Objective: Vital signs in last 24 hours: Temp:  [98 F (36.7 C)-101 F (38.3 C)] 98 F (36.7 C) (10/31 0445) Pulse Rate:  [103-122] 105 (10/31 0445) Resp:  [13-18] 17 (10/31 0736) BP: (101-136)/(67-80) 136/80 mmHg (10/31 0445) SpO2:  [93 %-100 %] 97 % (10/31 0736)  Intake/Output from previous day: 10/30 0701 - 10/31 0700 In: -  Out: 1500 [Urine:975; Drains:525] Intake/Output this shift:    Physical exam the patient is alert and oriented. She is moving her lower extremities well.  Lab Results:  Recent Labs  06/19/13 0950 06/21/13 0505  WBC 6.3 7.6  HGB 14.4 11.0*  HCT 40.8 32.1*  PLT 202 171   BMET  Recent Labs  06/19/13 0950 06/21/13 0505  NA 135 131*  K 3.6 4.1  CL 99 96  CO2 24 28  GLUCOSE 83 116*  BUN 21 11  CREATININE 0.55 0.60  CALCIUM 9.0 8.5    Studies/Results: Dg Lumbar Spine 2-3 Views  06/20/2013   CLINICAL DATA:  L1-5 decompression with instrumentation and fusion  EXAM: DG C-ARM 1-60 MIN; LUMBAR SPINE - 2-3 VIEW  COMPARISON:  Intraoperative radiographs obtained earlier today at 14:27 p.m.  FINDINGS: A total of 6 intraoperative spot views demonstrate placement of bilateral pedicle screws at L1, L2-L4 and L5. There is a compression fracture at L3. No evidence of hardware complication involving the pedicle screws. No rods are visible on these spot radiographs.  IMPRESSION: Intraoperative radiographs obtained during L1-L5 decompression and posterior fusion.   Electronically Signed   By: Malachy Moan M.D.   On: 06/20/2013 17:34   Dg Lumbar Spine 1 View  06/20/2013   CLINICAL DATA:  L2-5 PLIF.  EXAM: LUMBAR SPINE - 1 VIEW  COMPARISON:  None.  FINDINGS: Single intraoperative cross-table lateral view of the lumbar spine, taken at 1430 hr, is submitted. There are no available prior  exams for comparison. Surgical instrument tip projects posterior to the L3-4 facet joints. There is an L3 compression fracture. Degenerative disc disease at L5-S1.  IMPRESSION: 1. Intraoperative visualization with surgical instrument localized at the L3-4 level. 2. L3 compression fracture.   Electronically Signed   By: Leanna Battles M.D.   On: 06/20/2013 16:49   Dg C-arm 1-60 Min  06/20/2013   CLINICAL DATA:  L1-5 decompression with instrumentation and fusion  EXAM: DG C-ARM 1-60 MIN; LUMBAR SPINE - 2-3 VIEW  COMPARISON:  Intraoperative radiographs obtained earlier today at 14:27 p.m.  FINDINGS: A total of 6 intraoperative spot views demonstrate placement of bilateral pedicle screws at L1, L2-L4 and L5. There is a compression fracture at L3. No evidence of hardware complication involving the pedicle screws. No rods are visible on these spot radiographs.  IMPRESSION: Intraoperative radiographs obtained during L1-L5 decompression and posterior fusion.   Electronically Signed   By: Malachy Moan M.D.   On: 06/20/2013 17:34    Assessment/Plan: Postop day #2: The patient is doing well. I will discontinue the PCA pump and we will continue to mobilize her with PT and OT. She will likely go home this weekend. I gave her her discharge instructions and answered all her questions.  Low-grade fever: This is likely secondary to atelectasis. We will culture as necessary.  LOS: 9 days     , D 06/22/2013, 8:25 AM

## 2013-06-23 MED ORDER — BISACODYL 10 MG RE SUPP
10.0000 mg | Freq: Every day | RECTAL | Status: DC | PRN
  Administered 2013-06-23 – 2013-06-25 (×2): 10 mg via RECTAL
  Filled 2013-06-23 (×2): qty 1

## 2013-06-23 MED ORDER — MAGNESIUM CITRATE PO SOLN
1.0000 | Freq: Once | ORAL | Status: DC
  Filled 2013-06-23: qty 296

## 2013-06-23 NOTE — Progress Notes (Signed)
Patient ID: Teresa Sherman, female   DOB: [birth year], 67 y.o.   MRN: [id number] Subjective: Patient reports increased back pain today. No leg pain or numbness or tingling. Constipated.  Objective: Vital signs in last 24 hours: Temp:  [97.3 F (36.3 C)-99.5 F (37.5 C)] 98.7 F (37.1 C) (11/01 1000) Pulse Rate:  [82-120] 82 (11/01 1000) Resp:  [16-18] 18 (11/01 1000) BP: (94-116)/(48-83) 104/70 mmHg (11/01 1000) SpO2:  [93 %-98 %] 96 % (11/01 1000)  Intake/Output from previous day: 10/31 0701 - 11/01 0700 In: 360 [P.O.:360] Out: -  Intake/Output this shift:    Neurologic: Grossly normal to in bed exam  Lab Results: Lab Results  Component Value Date   WBC 7.6 06/21/2013   HGB 11.0* 06/21/2013   HCT 32.1* 06/21/2013   MCV 89.9 06/21/2013   PLT 171 06/21/2013   Lab Results  Component Value Date   INR 1.03 06/19/2013   BMET Lab Results  Component Value Date   NA 131* 06/21/2013   K 4.1 06/21/2013   CL 96 06/21/2013   CO2 28 06/21/2013   GLUCOSE 116* 06/21/2013   BUN 11 06/21/2013   CREATININE 0.60 06/21/2013   CALCIUM 8.5 06/21/2013    Studies/Results: No results found.  Assessment/Plan: Patient having appropriate postoperative back pain. Mobilize as tolerated   LOS: 10 days    , S 06/23/2013, 10:41 AM

## 2013-06-23 NOTE — Progress Notes (Signed)
Physical Therapy Treatment Patient Details Name: Teresa Sherman MRN: [id number] DOB: 03/02/1946 Today's Date: 06/23/2013 Time: 7829-5621 PT Time Calculation (min): 23 min  PT Assessment / Plan / Recommendation  History of Present Illness pt presents with L3 compression fx who failed conservative treatment and is now post L1-5 Fusion.     PT Comments   Patient making good progress with mobility and gait today - able to ambulate 110' with RW and min assist.  Requires max encouragement for participation.  Follow Up Recommendations  SNF     Does the patient have the potential to tolerate intense rehabilitation     Barriers to Discharge        Equipment Recommendations  Rolling walker with 5" wheels    Recommendations for Other Services    Frequency Min 5X/week   Progress towards PT Goals Progress towards PT goals: Progressing toward goals  Plan Current plan remains appropriate    Precautions / Restrictions Precautions Precautions: Fall;Back Precaution Comments: Reviewed back precautions Required Braces or Orthoses: Spinal Brace Spinal Brace: Lumbar corset;Applied in sitting position Restrictions Weight Bearing Restrictions: No   Pertinent Vitals/Pain     Mobility  Bed Mobility Bed Mobility: Rolling Right;Right Sidelying to Sit;Sitting - Scoot to Edge of Bed Rolling Right: 5: Supervision;With rail Right Sidelying to Sit: 4: Min assist;With rails;HOB flat Sitting - Scoot to Edge of Bed: 4: Min guard;With rail (Increased time) Details for Bed Mobility Assistance: Verbal cues step-by-step for technique, with focus on maintaining back precautions.  Patient required increased time for each part of bed mobility. Transfers Transfers: Sit to Stand;Stand to Sit Sit to Stand: 4: Min assist;With upper extremity assist;From bed Stand to Sit: 4: Min guard;With upper extremity assist;With armrests;To chair/3-in-1 Details for Transfer Assistance: Educated patient on donning back  brace.  Verbal cues for hand placement.  Min assist to rise to standing and for balance. Ambulation/Gait Ambulation/Gait Assistance: 4: Min assist Ambulation Distance (Feet): 110 Feet Assistive device: Rolling walker Ambulation/Gait Assistance Details: Verbal cues to stand upright and look forward during gait.  Patient with hips flexed and looking at ground during gait.  Verbal cues for safe use of RW during turns. Gait Pattern: Step-through pattern;Decreased stride length;Trunk flexed (Decreased hip extension) Gait velocity: Slow gait speed      PT Goals (current goals can now be found in the care plan section)    Visit Information  Last PT Received On: 06/23/13 Assistance Needed: +1 History of Present Illness: pt presents with L3 compression fx who failed conservative treatment and is now post L1-5 Fusion.      Subjective Data  Subjective: "I did too much yesterday.  I had PT and OT, and sat in the chair for 2 hours"  Attempted to encourage patient and praise this mobility.   Cognition  Cognition Arousal/Alertness: Awake/alert Behavior During Therapy: WFL for tasks assessed/performed Overall Cognitive Status: Within Functional Limits for tasks assessed    Balance     End of Session PT - End of Session Equipment Utilized During Treatment: Gait belt;Back brace Activity Tolerance: Patient tolerated treatment well;Patient limited by fatigue Patient left: in chair;with call bell/phone within reach;with family/visitor present Nurse Communication: Mobility status   GP     Vena Austria 06/23/2013, 5:27 PM Durenda Hurt. Renaldo Fiddler, [facility] Acute Rehab Services Pager [phone]

## 2013-06-24 NOTE — Plan of Care (Signed)
Problem: Consults Goal: Diagnosis - Spinal Surgery Outcome: Completed/Met Date Met:  06/24/13 [place]/Lumbar Spine Fusion

## 2013-06-24 NOTE — Clinical Social Work Note (Signed)
Clinical Social Work Department BRIEF PSYCHOSOCIAL ASSESSMENT 06/24/2013  Patient:  Teresa Sherman, Teresa Sherman     Account Number:  [id number]     Admit date:  06/13/2013  Clinical Social Worker:  Doreene Nest  Date/Time:  06/24/2013 11:30 AM  Referred by:  Physician  Date Referred:  06/22/2013 Referred for  SNF Placement   Other Referral:   Interview type:  Patient Other interview type:    PSYCHOSOCIAL DATA Living Status:  ALONE Admitted from facility:   Level of care:   Primary support name:  Albertine Patricia Primary support relationship to patient:  SIBLING Degree of support available:   pt reports very supportive and helping with discharge planning    CURRENT CONCERNS Current Concerns  Post-Acute Placement   Other Concerns:    SOCIAL WORK ASSESSMENT / PLAN CSW spoke with pt at bedside.  Pt discussed hospital admission with CSW and struggling with adjustments of losing her job due to injury.  CSW listened and validated pt's emotions.  CSW discussed discharge planning and options.  Pt wants to look into SNF for rehab.  CSW discussed starting process and insurance with [employer].  CSW completed clinicals and sent to SNFs and insurance.   Assessment/plan status:  Psychosocial Support/Ongoing Assessment of Needs Other assessment/ plan:   Information/referral to community resources:    PATIENT'S/FAMILY'S RESPONSE TO PLAN OF CARE: Pt was appreciative of CSW assistance with discharge planning.  Pt was tearful during assessment when discussing accident.  Pt expressed understanding of placement process and asked if her brother could contact CSW to help with planning.

## 2013-06-24 NOTE — Clinical Social Work Note (Signed)
Clinical Social Work Department CLINICAL SOCIAL WORK PLACEMENT NOTE 06/24/2013  Patient:  Teresa Sherman, Teresa Sherman  Account Number:  [id number] Admit date:  06/13/2013  Clinical Social Worker:  Truman Hayward, LCSW  Date/time:  06/24/2013 11:30 AM  Clinical Social Work is seeking post-discharge placement for this patient at the following level of care:   SKILLED NURSING   (*CSW will update this form in Epic as items are completed)   06/24/2013  Patient/family provided with [facility] Department of Clinical Social Work's list of facilities offering this level of care within the geographic area requested by the patient (or if unable, by the patient's family).  06/24/2013  Patient/family informed of their freedom to choose among providers that offer the needed level of care, that participate in Medicare, Medicaid or managed care program needed by the patient, have an available bed and are willing to accept the patient.  06/24/2013  Patient/family informed of [facility]' ownership interest in [facility], as well as of the fact that they are under no obligation to receive care at this facility.  PASARR submitted to [employer] on 06/24/2013 PASARR number received from [employer] on   FL2 transmitted to all facilities in geographic area requested by pt/family on  06/24/2013 FL2 transmitted to all facilities within larger geographic area on   Patient informed that his/her managed care company has contracts with or will negotiate with  certain facilities, including the following:     Patient/family informed of bed offers received:   Patient chooses bed at  Physician recommends and patient chooses bed at    Patient to be transferred to  on   Patient to be transferred to facility by   The following physician request were entered in Epic:   Additional Comments:

## 2013-06-24 NOTE — Progress Notes (Signed)
Physical Therapy Treatment Patient Details Name: Teresa Sherman MRN: [id number] DOB: 1945-09-06 Today's Date: 06/24/2013 Time: 1478-2956 PT Time Calculation (min): 24 min  PT Assessment / Plan / Recommendation  History of Present Illness pt presents with L3 compression fx who failed conservative treatment and is now post L1-5 Fusion.     PT Comments   Patient requires max encouragement to participate with PT.  Once OOB, patient did well with mobility.  Continues to make improvements with mobility and gait.  Follow Up Recommendations  SNF     Does the patient have the potential to tolerate intense rehabilitation     Barriers to Discharge        Equipment Recommendations  Rolling walker with 5" wheels    Recommendations for Other Services    Frequency Min 5X/week   Progress towards PT Goals Progress towards PT goals: Progressing toward goals  Plan Current plan remains appropriate    Precautions / Restrictions Precautions Precautions: Fall;Back Precaution Comments: Reviewed back precautions Required Braces or Orthoses: Spinal Brace Spinal Brace: Lumbar corset;Applied in sitting position Restrictions Weight Bearing Restrictions: No   Pertinent Vitals/Pain Pain 7/10 impacting mobility    Mobility  Bed Mobility Bed Mobility: Rolling Right;Right Sidelying to Sit;Sitting - Scoot to Edge of Bed Rolling Right: 5: Supervision;With rail Right Sidelying to Sit: 4: Min guard;With rails;HOB flat Sitting - Scoot to Edge of Bed: 4: Min guard Details for Bed Mobility Assistance: Verbal cues for back precautions and technique.  Patient able to complete mobility with increased time and assist for safety.  Mod assist to don back brace. Transfers Transfers: Sit to Stand;Stand to Sit Sit to Stand: 4: Min assist;With upper extremity assist;From bed Stand to Sit: 4: Min guard;With upper extremity assist;With armrests;To chair/3-in-1 Details for Transfer Assistance: Verbal cues for hand  placement.  Patient reaches for RW to pull on to stand.  Encouraged placing hands on bed to stand. Ambulation/Gait Ambulation/Gait Assistance: 4: Min assist Ambulation Distance (Feet): 230 Feet Assistive device: Rolling walker Ambulation/Gait Assistance Details: Verbal cues to stand upright during gait.  Cues for safe use of RW.  Instructed patient to turn sideways and sidestep through narrow passageway.  Reminders not to twist when talking with family in hallway.  Have then come in front of her, or turn body toward them. Gait Pattern: Step-through pattern;Decreased stride length;Trunk flexed Gait velocity: Slow gait speed General Gait Details: Patient with improved, more upright posture today.      PT Goals (current goals can now be found in the care plan section)    Visit Information  Last PT Received On: 06/24/13 Assistance Needed: +1 History of Present Illness: pt presents with L3 compression fx who failed conservative treatment and is now post L1-5 Fusion.      Subjective Data  Subjective: "I didn't feel good this morning.  I'll try this afternoon"   Cognition  Cognition Arousal/Alertness: Awake/alert Behavior During Therapy: WFL for tasks assessed/performed Overall Cognitive Status: Within Functional Limits for tasks assessed    Balance     End of Session PT - End of Session Equipment Utilized During Treatment: Gait belt;Back brace Activity Tolerance: Patient tolerated treatment well;Patient limited by fatigue Patient left: in chair;with call bell/phone within reach;with family/visitor present Nurse Communication: Mobility status   GP     Vena Austria 06/24/2013, 4:38 PM Durenda Hurt. Renaldo Fiddler, [facility] Acute Rehab Services Pager [phone]

## 2013-06-24 NOTE — Progress Notes (Signed)
Doing well. C/o appropriate incisional soreness.   Temp:  [97.4 F (36.3 C)-98.1 F (36.7 C)] 97.6 F (36.4 C) (11/02 0953) Pulse Rate:  [86-95] 95 (11/02 0953) Resp:  [16-18] 16 (11/02 0953) BP: (98-117)/(60-73) 98/66 mmHg (11/02 0953) SpO2:  [96 %-99 %] 99 % (11/02 0953) Good strength and sensation Incision CDI  Plan: Increase activity  - pt wants SNF placement  - family is working on it

## 2013-06-25 LAB — CBC WITH DIFFERENTIAL/PLATELET
Basophils Absolute: 0 10*3/uL (ref 0.0–0.1)
Basophils Relative: 0 % (ref 0–1)
Eosinophils Absolute: 0 10*3/uL (ref 0.0–0.7)
Eosinophils Relative: 1 % (ref 0–5)
HCT: 30.2 % — ABNORMAL LOW (ref 36.0–46.0)
Hemoglobin: 10.8 g/dL — ABNORMAL LOW (ref 12.0–15.0)
Lymphocytes Relative: 24 % (ref 12–46)
Lymphs Abs: 1.7 10*3/uL (ref 0.7–4.0)
MCH: 31.5 pg (ref 26.0–34.0)
MCHC: 35.8 g/dL (ref 30.0–36.0)
MCV: 88 fL (ref 78.0–100.0)
Monocytes Absolute: 0.6 10*3/uL (ref 0.1–1.0)
Monocytes Relative: 8 % (ref 3–12)
Neutro Abs: 4.8 10*3/uL (ref 1.7–7.7)
Neutrophils Relative %: 67 % (ref 43–77)
Platelets: 409 10*3/uL — ABNORMAL HIGH (ref 150–400)
RBC: 3.43 MIL/uL — ABNORMAL LOW (ref 3.87–5.11)
RDW: 13 % (ref 11.5–15.5)
WBC: 7.2 10*3/uL (ref 4.0–10.5)

## 2013-06-25 MED ORDER — DIAZEPAM 5 MG PO TABS: 5.0000 mg | ORAL_TABLET | Freq: Four times a day (QID) | ORAL | Status: DC | PRN

## 2013-06-25 MED ORDER — OXYCODONE-ACETAMINOPHEN 10-325 MG PO TABS: 1.0000 | ORAL_TABLET | ORAL | Status: DC | PRN

## 2013-06-25 MED ORDER — DSS 100 MG PO CAPS: 100.0000 mg | ORAL_CAPSULE | Freq: Two times a day (BID) | ORAL | Status: DC

## 2013-06-25 NOTE — Clinical Social Work Note (Signed)
CSW re-sent clinical information to [facility] regarding discharge plan to SNF. CSW to continue to follow and assist with discharge planning needs.  Darlyn Chamber, LCSWA Clinical Social Worker [phone]

## 2013-06-25 NOTE — Progress Notes (Signed)
Patient ID: Teresa Sherman, female   DOB: 25-Apr-1946, 67 y.o.   MRN: [id number] Subjective:  The patient is alert and pleasant. She has been mobilizing.  Objective: Vital signs in last 24 hours: Temp:  [97.5 F (36.4 C)-98.8 F (37.1 C)] 97.8 F (36.6 C) (11/03 0932) Pulse Rate:  [87-100] 100 (11/03 0932) Resp:  [15-18] 16 (11/03 0932) BP: (102-116)/(54-81) 102/70 mmHg (11/03 0932) SpO2:  [96 %-100 %] 98 % (11/03 0932)  Intake/Output from previous day: 11/02 0701 - 11/03 0700 In: -  Out: 760 [Urine:760] Intake/Output this shift: Total I/O In: 240 [P.O.:240] Out: -   Physical exam patient is alert and oriented. Her wound is healing well without signs of infection. Her strength is normal in her lower extremities.  Lab Results: No results found for this basename: WBC, HGB, HCT, PLT,  in the last 72 hours BMET No results found for this basename: NA, K, CL, CO2, GLUCOSE, BUN, CREATININE, CALCIUM,  in the last 72 hours  Studies/Results: No results found.  Assessment/Plan: Postop day #5: The patient is doing well. We are awaiting skilled nursing facility placement.  LOS: 12 days     , D 06/25/2013, 10:41 AM

## 2013-06-25 NOTE — Discharge Summary (Signed)
  Physician Discharge Summary  Patient ID: Teresa Sherman MRN: [id number] DOB/AGE: 12-10-1945 67 y.o.  Admit date: 06/13/2013 Discharge date: 06/25/2013  Admission Diagnoses: L3 compression fracture, lumbago, lumbar spinal stenosis  Discharge Diagnoses: The same Active Problems:   * No active hospital problems. *   Discharged Condition: good  Hospital Course: I admitted the patient to [facility] [place] on 06/13/2013 with a diagnosis of an L3 compression fracture. The patient initially was not interested in surgery. We attempted to mobilize her in a TLSO with PT and OT. The patient failed to mobilize. I discussed surgery with her and she decided proceed with a lumbar decompression, instrumentation and fusion.  I performed an open reduction of an L3 compression fracture, L3 laminectomy, and a L1-L5 posterior instrumentation and fusion. The surgery went well.  The patient's postoperative course was unremarkable. She was seen by PT and OT. Arrangements were made for transfer to a skilled nursing facility.  Consults: PT OT Significant Diagnostic Studies: None Treatments: L1-L5 posterior instrumentation, and fusion with L 3 laminectomy and open reduction of L3 fracture Discharge Exam: Blood pressure 102/70, pulse 100, temperature 97.8 F (36.6 C), temperature source Oral, resp. rate 16, height 5' (1.524 m), weight 61.236 kg (135 lb), SpO2 98.00%. The patient is alert and pleasant. Her wound is healing well without signs of infection. Her strength is normal in her lower extremities.  Disposition: Skilled nursing facility     Medication List    STOP taking these medications       ibuprofen 200 MG tablet  Commonly known as:  ADVIL,MOTRIN      TAKE these medications       diazepam 5 MG tablet  Commonly known as:  VALIUM  Take 1 tablet (5 mg total) by mouth every 6 (six) hours as needed.     DSS 100 MG Caps  Take 100 mg by mouth 2 (two) times daily.     nortriptyline 10 MG  capsule  Commonly known as:  PAMELOR  Take 20 mg by mouth at bedtime.     oxyCODONE-acetaminophen 10-325 MG per tablet  Commonly known as:  PERCOCET  Take 1 tablet by mouth every 4 (four) hours as needed for pain.     VISINE 0.05 % ophthalmic solution  Generic drug:  tetrahydrozoline  Place 1 drop into both eyes 2 (two) times daily as needed (for burning sensation in eyes).         SignedCristi Loron 06/25/2013, 12:48 PM

## 2013-06-25 NOTE — Progress Notes (Signed)
RN notified about patient having blooding stools by the NT. On assessments, bleeding seen is fresh and some few small clots in. Could not do occult test since the blood is obvious. Therefore waiting on MD for instructions.

## 2013-06-25 NOTE — Progress Notes (Signed)
Physical Therapy Treatment Patient Details Name: Teresa Sherman MRN: [id number] DOB: March 15, 1946 Today's Date: 06/25/2013 Time: 4098-1191 PT Time Calculation (min): 23 min  PT Assessment / Plan / Recommendation  History of Present Illness pt presents with L3 compression fx who failed conservative treatment and is now post L1-5 Fusion.     PT Comments   Patient continues to make slow progress.  Follow Up Recommendations  SNF     Does the patient have the potential to tolerate intense rehabilitation     Barriers to Discharge        Equipment Recommendations  Rolling walker with 5" wheels    Recommendations for Other Services    Frequency Min 5X/week   Progress towards PT Goals Progress towards PT goals: Progressing toward goals  Plan Current plan remains appropriate    Precautions / Restrictions Precautions Precautions: Fall;Back Precaution Comments: Reviewed back precautions Required Braces or Orthoses: Spinal Brace Spinal Brace: Lumbar corset;Applied in sitting position Restrictions Weight Bearing Restrictions: No   Pertinent Vitals/Pain     Mobility  Bed Mobility Bed Mobility: Rolling Right;Right Sidelying to Sit;Sitting - Scoot to Edge of Bed Rolling Right: 5: Supervision;With rail Right Sidelying to Sit: 4: Min guard;With rails;HOB flat Sitting - Scoot to Edge of Bed: 4: Min guard Details for Bed Mobility Assistance: Verbal cues for back precautions and technique.  Patient able to complete mobility with increased time and assist for safety.  Min assist to don back brace - patient able to assist. Transfers Transfers: Sit to Stand;Stand to Sit Sit to Stand: 4: Min assist;With upper extremity assist;From bed Stand to Sit: 4: Min guard;With upper extremity assist;With armrests;To chair/3-in-1 Details for Transfer Assistance: Verbal cues for hand placement.  Patient reaches for RW to pull on to stand.  Encouraged placing hands on bed to  stand. Ambulation/Gait Ambulation/Gait Assistance: 4: Min assist Ambulation Distance (Feet): 280 Feet Assistive device: Rolling walker Ambulation/Gait Assistance Details: Verbal cues to stand upright and look up during gait.  Encouraged less weightbearing on UE's on RW to assist with upright posture.  Patient required cuing and assist to maneuver RW during turns today. Gait Pattern: Step-through pattern;Decreased stride length;Trunk flexed Gait velocity: Slow gait speed      PT Goals (current goals can now be found in the care plan section)    Visit Information  Last PT Received On: 06/25/13 Assistance Needed: +1 History of Present Illness: pt presents with L3 compression fx who failed conservative treatment and is now post L1-5 Fusion.      Subjective Data  Subjective: "My back is sore"   Cognition  Cognition Arousal/Alertness: Awake/alert Behavior During Therapy: WFL for tasks assessed/performed Overall Cognitive Status: Within Functional Limits for tasks assessed    Balance     End of Session PT - End of Session Equipment Utilized During Treatment: Gait belt;Back brace Activity Tolerance: Patient tolerated treatment well;Patient limited by pain (Patient reports "lots of pain" then rates pain as 3/10) Patient left: in chair;with call bell/phone within reach Nurse Communication: Mobility status   GP     Vena Austria 06/25/2013, 1:20 PM Durenda Hurt. Renaldo Fiddler, [facility] Acute Rehab Services Pager [phone]

## 2013-06-26 ENCOUNTER — Other Ambulatory Visit: Payer: Self-pay | Admitting: *Deleted

## 2013-06-26 MED ORDER — OXYCODONE-ACETAMINOPHEN 10-325 MG PO TABS: ORAL_TABLET | ORAL | Status: DC

## 2013-06-26 MED ORDER — DIAZEPAM 5 MG PO TABS: ORAL_TABLET | ORAL | Status: DC

## 2013-06-26 NOTE — Progress Notes (Signed)
Patient ID: Teresa Sherman, female   DOB: [birth year], 67 y.o.   MRN: [id number] Subjective:  The patient is alert and pleasant. She looks well. She wants to go to rehabilitation. She is frustrated that the arrangements have not been made yet.  Objective: Vital signs in last 24 hours: Temp:  [97.1 F (36.2 C)-98 F (36.7 C)] 97.8 F (36.6 C) (11/04 0527) Pulse Rate:  [93-105] 93 (11/04 0527) Resp:  [16-18] 16 (11/04 0527) BP: (102-120)/(62-78) 105/65 mmHg (11/04 0527) SpO2:  [97 %-99 %] 97 % (11/04 0527)  Intake/Output from previous day: 11/03 0701 - 11/04 0700 In: 720 [P.O.:720] Out: 450 [Urine:450] Intake/Output this shift:    Physical exam the patient is alert and pleasant. Her strength is normal. Her wound is healing well without signs of infection.  Lab Results:  Recent Labs  06/25/13 1220  WBC 7.2  HGB 10.8*  HCT 30.2*  PLT 409*   BMET No results found for this basename: NA, K, CL, CO2, GLUCOSE, BUN, CREATININE, CALCIUM,  in the last 72 hours  Studies/Results: No results found.  Assessment/Plan: Postop day #6: The patient is doing well and we are awaiting skilled nursing facility placement for rehabilitation. I gave the patient her discharge instructions and have answered all her questions.  LOS: 13 days     , D 06/26/2013, 7:44 AM

## 2013-06-26 NOTE — Progress Notes (Signed)
Physical Therapy Treatment Patient Details Name: Teresa Sherman MRN: [id number] DOB: 1946-05-04 Today's Date: 06/26/2013 Time: 7829-5621 PT Time Calculation (min): 27 min  PT Assessment / Plan / Recommendation  History of Present Illness pt presents with L3 compression fx who failed conservative treatment and is now post L1-5 Fusion.     PT Comments   Pt with much improved mobility compared to when this PT last saw her.  Pt continues to needs cues and A for back precautions and safety.  Feel pt will still benefit from SNF at D/C to maximize independence.    Follow Up Recommendations  SNF     Does the patient have the potential to tolerate intense rehabilitation     Barriers to Discharge        Equipment Recommendations  Rolling walker with 5" wheels    Recommendations for Other Services    Frequency Min 5X/week   Progress towards PT Goals Progress towards PT goals: Progressing toward goals  Plan Current plan remains appropriate    Precautions / Restrictions Precautions Precautions: Fall;Back Precaution Comments: Reviewed back precautions Required Braces or Orthoses: Spinal Brace Spinal Brace: Lumbar corset;Applied in sitting position Restrictions Weight Bearing Restrictions: No   Pertinent Vitals/Pain Indicates UEs hurt more than back while ambulating.      Mobility  Bed Mobility Bed Mobility: Rolling Left;Left Sidelying to Sit;Sitting - Scoot to Edge of Bed Rolling Left: 5: Supervision;With rail Left Sidelying to Sit: 5: Supervision;With rails Sitting - Scoot to Edge of Bed: 5: Supervision Details for Bed Mobility Assistance: With increased time pt able to complete bed mobility without physical A, but needs cueing for back precautions and safety.   Transfers Transfers: Sit to Stand;Stand to Sit Sit to Stand: 4: Min assist;With upper extremity assist;From bed Stand to Sit: 4: Min guard;With upper extremity assist;To chair/3-in-1 Details for Transfer Assistance:  Verbal cues for hand placement.  Patient reaches for RW to pull on to stand.  Encouraged placing hands on bed to stand. Ambulation/Gait Ambulation/Gait Assistance: 4: Min assist Ambulation Distance (Feet): 280 Feet Assistive device: Rolling walker Ambulation/Gait Assistance Details: cues for upright posture, staying closer to RW and relaxing UEs.  pt c/o soreness and tightness in UEs as much as back soreness.   Gait Pattern: Step-through pattern;Decreased stride length;Trunk flexed Stairs: No Wheelchair Mobility Wheelchair Mobility: No    Exercises     PT Diagnosis:    PT Problem List:   PT Treatment Interventions:     PT Goals (current goals can now be found in the care plan section) Acute Rehab PT Goals PT Goal Formulation: With patient Time For Goal Achievement: 07/05/13 Potential to Achieve Goals: Fair  Visit Information  Last PT Received On: 06/26/13 Assistance Needed: +1 History of Present Illness: pt presents with L3 compression fx who failed conservative treatment and is now post L1-5 Fusion.      Subjective Data  Subjective: "Will the pain ever stop."     Cognition  Cognition Arousal/Alertness: Awake/alert Behavior During Therapy: WFL for tasks assessed/performed Overall Cognitive Status: Within Functional Limits for tasks assessed    Balance  Balance Balance Assessed: No  End of Session PT - End of Session Equipment Utilized During Treatment: Gait belt;Back brace Activity Tolerance: Patient tolerated treatment well Patient left: in chair;with call bell/phone within reach Nurse Communication: Mobility status   GP     Sunny Schlein, [place] [phone] 06/26/2013, 10:46 AM

## 2013-06-26 NOTE — Progress Notes (Signed)
Patient is discharged from room 4N32 at this time.  Alert and in stable condition. IV site d/c'd. Patient understands she will be going to rehab. Transported by [facility]. Report given to nurse Archie Patten at [facility].

## 2013-06-29 ENCOUNTER — Non-Acute Institutional Stay (SKILLED_NURSING_FACILITY): Payer: Medicare Other | Admitting: Nurse Practitioner

## 2013-06-29 DIAGNOSIS — T781XXA Other adverse food reactions, not elsewhere classified, initial encounter: Secondary | ICD-10-CM

## 2013-06-29 DIAGNOSIS — F329 Major depressive disorder, single episode, unspecified: Secondary | ICD-10-CM

## 2013-06-29 DIAGNOSIS — F3289 Other specified depressive episodes: Secondary | ICD-10-CM

## 2013-06-29 DIAGNOSIS — F4323 Adjustment disorder with mixed anxiety and depressed mood: Secondary | ICD-10-CM

## 2013-06-29 DIAGNOSIS — Z981 Arthrodesis status: Secondary | ICD-10-CM

## 2013-06-29 DIAGNOSIS — Z91018 Allergy to other foods: Secondary | ICD-10-CM

## 2013-06-29 DIAGNOSIS — T7819XA Other adverse food reactions, not elsewhere classified, initial encounter: Secondary | ICD-10-CM

## 2013-07-02 ENCOUNTER — Non-Acute Institutional Stay (SKILLED_NURSING_FACILITY): Payer: Medicare Other | Admitting: Nurse Practitioner

## 2013-07-02 DIAGNOSIS — D473 Essential (hemorrhagic) thrombocythemia: Secondary | ICD-10-CM

## 2013-07-05 ENCOUNTER — Encounter: Payer: Self-pay | Admitting: Internal Medicine

## 2013-07-05 ENCOUNTER — Non-Acute Institutional Stay (SKILLED_NURSING_FACILITY): Payer: Medicare Other | Admitting: Internal Medicine

## 2013-07-05 DIAGNOSIS — Q7649 Other congenital malformations of spine, not associated with scoliosis: Secondary | ICD-10-CM

## 2013-07-05 DIAGNOSIS — F411 Generalized anxiety disorder: Secondary | ICD-10-CM | POA: Insufficient documentation

## 2013-07-05 DIAGNOSIS — M4326 Fusion of spine, lumbar region: Secondary | ICD-10-CM | POA: Insufficient documentation

## 2013-07-05 DIAGNOSIS — M48061 Spinal stenosis, lumbar region without neurogenic claudication: Secondary | ICD-10-CM | POA: Insufficient documentation

## 2013-07-05 DIAGNOSIS — F341 Dysthymic disorder: Secondary | ICD-10-CM

## 2013-07-05 DIAGNOSIS — R2681 Unsteadiness on feet: Secondary | ICD-10-CM | POA: Insufficient documentation

## 2013-07-05 DIAGNOSIS — F418 Other specified anxiety disorders: Secondary | ICD-10-CM | POA: Insufficient documentation

## 2013-07-05 DIAGNOSIS — K5900 Constipation, unspecified: Secondary | ICD-10-CM | POA: Insufficient documentation

## 2013-07-05 DIAGNOSIS — R269 Unspecified abnormalities of gait and mobility: Secondary | ICD-10-CM
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
## 2013-07-09 ENCOUNTER — Non-Acute Institutional Stay (SKILLED_NURSING_FACILITY): Payer: Medicare Other | Admitting: Nurse Practitioner

## 2013-07-09 DIAGNOSIS — D473 Essential (hemorrhagic) thrombocythemia: Secondary | ICD-10-CM

## 2013-07-09 DIAGNOSIS — F418 Other specified anxiety disorders: Secondary | ICD-10-CM

## 2013-07-09 DIAGNOSIS — Z91018 Allergy to other foods: Secondary | ICD-10-CM

## 2013-07-09 DIAGNOSIS — T781XXA Other adverse food reactions, not elsewhere classified, initial encounter: Secondary | ICD-10-CM

## 2013-07-09 DIAGNOSIS — F341 Dysthymic disorder: Secondary | ICD-10-CM

## 2013-07-09 DIAGNOSIS — M4326 Fusion of spine, lumbar region: Secondary | ICD-10-CM

## 2013-07-09 DIAGNOSIS — Z981 Arthrodesis status: Secondary | ICD-10-CM

## 2013-07-09 DIAGNOSIS — Q7649 Other congenital malformations of spine, not associated with scoliosis: Secondary | ICD-10-CM
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
## 2013-09-05 NOTE — Progress Notes (Signed)
     Date of visit 07/02/2013  [facility]  Patient ID: Teresa Sherman, female   DOB: 05-20-1946, 68 y.o.   MRN: [id number]  Allergies  Allergen Reactions  . Codeine Nausea And Vomiting    migraine  . Monosodium Glutamate     migraine  . Nitrates, Organic     migraine  . Other     *says can ONLY EAT ORGANIC THINGS ONLY. All other foods, non-organic products will make her sick  . Phosphorus     migraine  . Sodium Benzoate [Nutritional Supplements]     migraine  . Sodium Phosphates     migraine  . Zithromax [Azithromycin]     Migraine     Chief Complaint  Patient presents with  . Acute Visit   History of Present Illness: I am seeing pt today for f/u of labs in particular platelets being elevated at 771,  The rest of the complete blood count was within acceptable limits, WBC 7.6 RBC 4.0 Hemoglobin 11.7 Hematocrit 37.9 MCV 93.8 MCHC 30.9 RDW 14.4 Platelets 771  Sodium 136 Potassium 3.7 Chloride 101 CO2 25 AGAP  Glucose 104 BUN 22 Creatinine 0.7 Calcium 9.2 Total protein 7 Albumin 4 GLOB 3 Total bilirubin 0.3 ALP 180 AST 12 ALT 20   Patient is alert, verbally appropriate, in no apparent distress. She is oriented x3. And essentially no change from her baseline status. Per nursing records, vital signs are stable  Have spoken with the attending physician.  Will have platelet count repeated tomorrow and will follow from that point.

## 2013-11-26 ENCOUNTER — Ambulatory Visit: Payer: Self-pay

## 2013-11-26 LAB — COMPREHENSIVE METABOLIC PANEL
ALBUMIN: 3.9 g/dL (ref 3.4–5.0)
ALK PHOS: 86 U/L
ANION GAP: 11 (ref 7–16)
BILIRUBIN TOTAL: 0.7 mg/dL (ref 0.2–1.0)
BUN: 17 mg/dL (ref 7–18)
Calcium, Total: 9.5 mg/dL (ref 8.5–10.1)
Chloride: 105 mmol/L (ref 98–107)
Co2: 26 mmol/L (ref 21–32)
Creatinine: 0.69 mg/dL (ref 0.60–1.30)
EGFR (African American): >60
GLUCOSE: 105 mg/dL — AB (ref 65–99)
Osmolality: 285 (ref 275–301)
Potassium: 3.8 mmol/L (ref 3.5–5.1)
SGOT(AST): 15 U/L (ref 15–37)
SGPT (ALT): 25 U/L (ref 12–78)
Sodium: 142 mmol/L (ref 136–145)
TOTAL PROTEIN: 7.7 g/dL (ref 6.4–8.2)

## 2013-11-26 LAB — CBC WITH DIFFERENTIAL/PLATELET
BASOS ABS: 0 10*3/uL (ref 0.0–0.1)
Basophil %: 0.2 %
EOS ABS: 0 10*3/uL (ref 0.0–0.7)
Eosinophil %: 0.1 %
HCT: 45 % (ref 35.0–47.0)
HGB: 15.2 g/dL (ref 12.0–16.0)
LYMPHS ABS: 1.3 10*3/uL (ref 1.0–3.6)
Lymphocyte %: 16.7 %
MCH: 30 pg (ref 26.0–34.0)
MCHC: 33.8 g/dL (ref 32.0–36.0)
MCV: 89 fL (ref 80–100)
MONOS PCT: 3.8 %
Monocyte #: 0.3 x10 3/mm (ref 0.2–0.9)
NEUTROS PCT: 79.2 %
Neutrophil #: 6.1 10*3/uL (ref 1.4–6.5)
Platelet: 218 10*3/uL (ref 150–440)
RBC: 5.06 10*6/uL (ref 3.80–5.20)
RDW: 15.2 % — AB (ref 11.5–14.5)
WBC: 7.7 10*3/uL (ref 3.6–11.0)

## 2014-02-05 ENCOUNTER — Ambulatory Visit: Payer: Self-pay | Admitting: Internal Medicine

## 2014-02-12 ENCOUNTER — Ambulatory Visit: Payer: Self-pay | Admitting: Internal Medicine

## 2014-02-12 LAB — HM MAMMOGRAPHY: HM Mammogram: NORMAL

## 2014-03-06 DIAGNOSIS — G479 Sleep disorder, unspecified: Secondary | ICD-10-CM | POA: Insufficient documentation

## 2014-03-25 LAB — CBC AND DIFFERENTIAL
HCT: 44 % (ref 36–46)
Hemoglobin: 14.9 g/dL (ref 12.0–16.0)
PLATELETS: 231 10*3/uL (ref 150–399)

## 2014-03-25 LAB — LIPID PANEL
Cholesterol: 228 mg/dL — AB (ref 0–200)
HDL: 63 mg/dL (ref 35–70)
Triglycerides: 102 mg/dL (ref 40–160)

## 2014-03-25 LAB — TSH: TSH: 1.5 u[IU]/mL (ref <=5.90)

## 2014-03-28 LAB — LIPID PANEL: LDL CALC: 145 mg/dL

## 2014-08-28 ENCOUNTER — Ambulatory Visit: Payer: Self-pay | Admitting: Family Medicine

## 2014-08-28 IMAGING — CR DG LUMBAR SPINE COMPLETE 4+V
5 series · 5 of 5 positions shown · non-contrast
Comparison: Plain films lumbar spine [DATE].

CLINICAL DATA: Low back pain since a fall the night of [DATE].
History of prior lumbar surgery.

EXAM:
LUMBAR SPINE - COMPLETE 4+ VIEW

[l-spine ap]
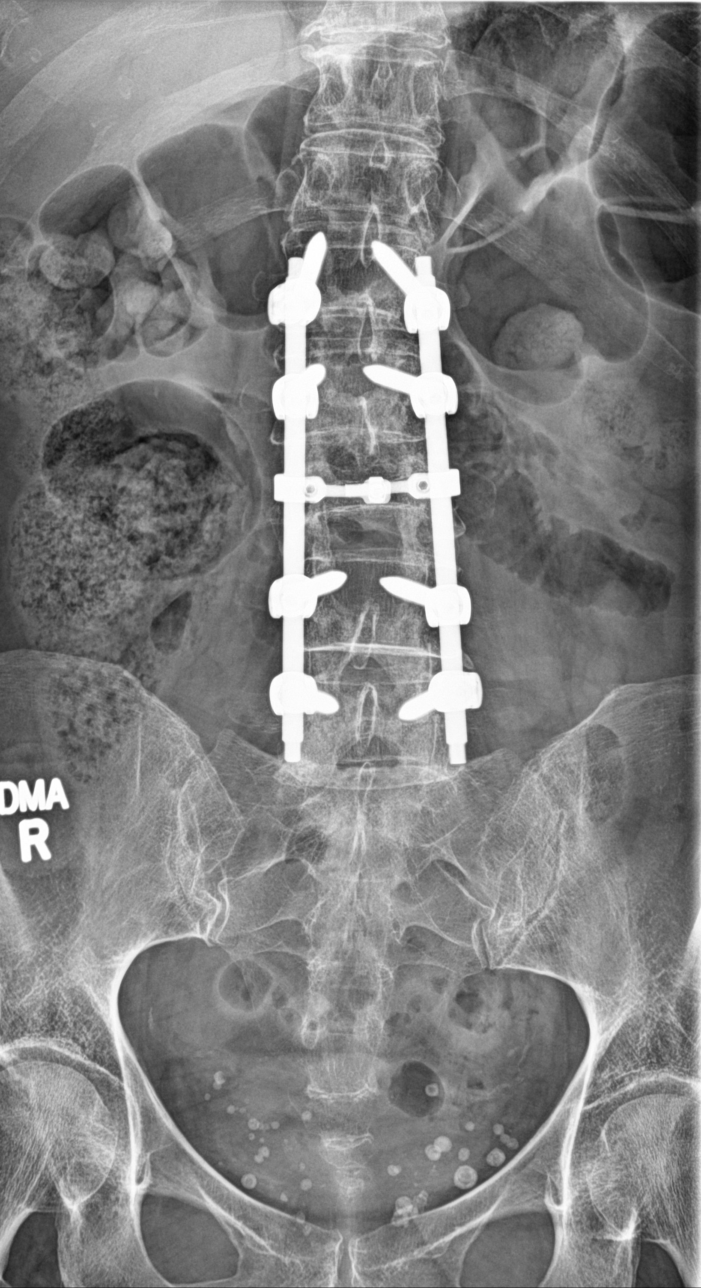

[l-spine obl (1 of 2)]
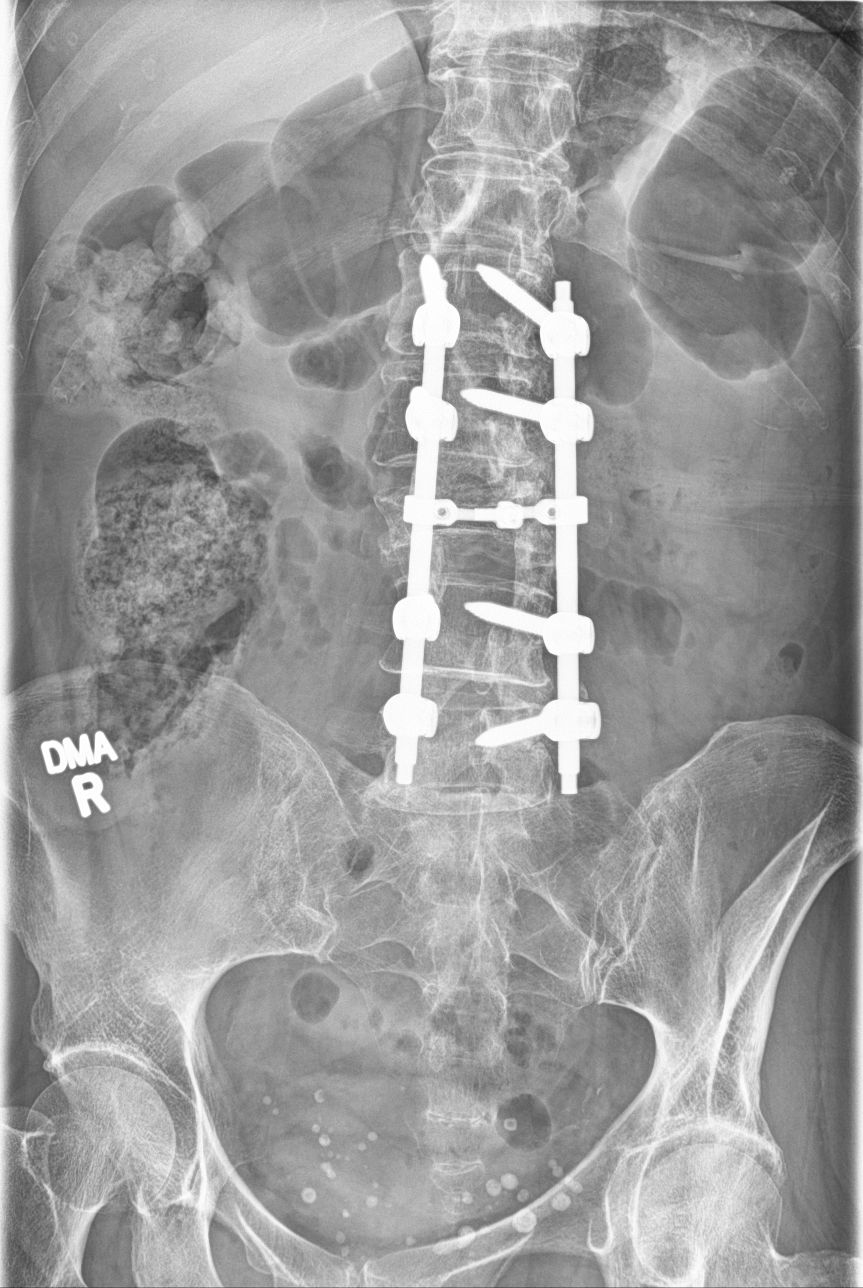

[l-spine obl (2 of 2)]
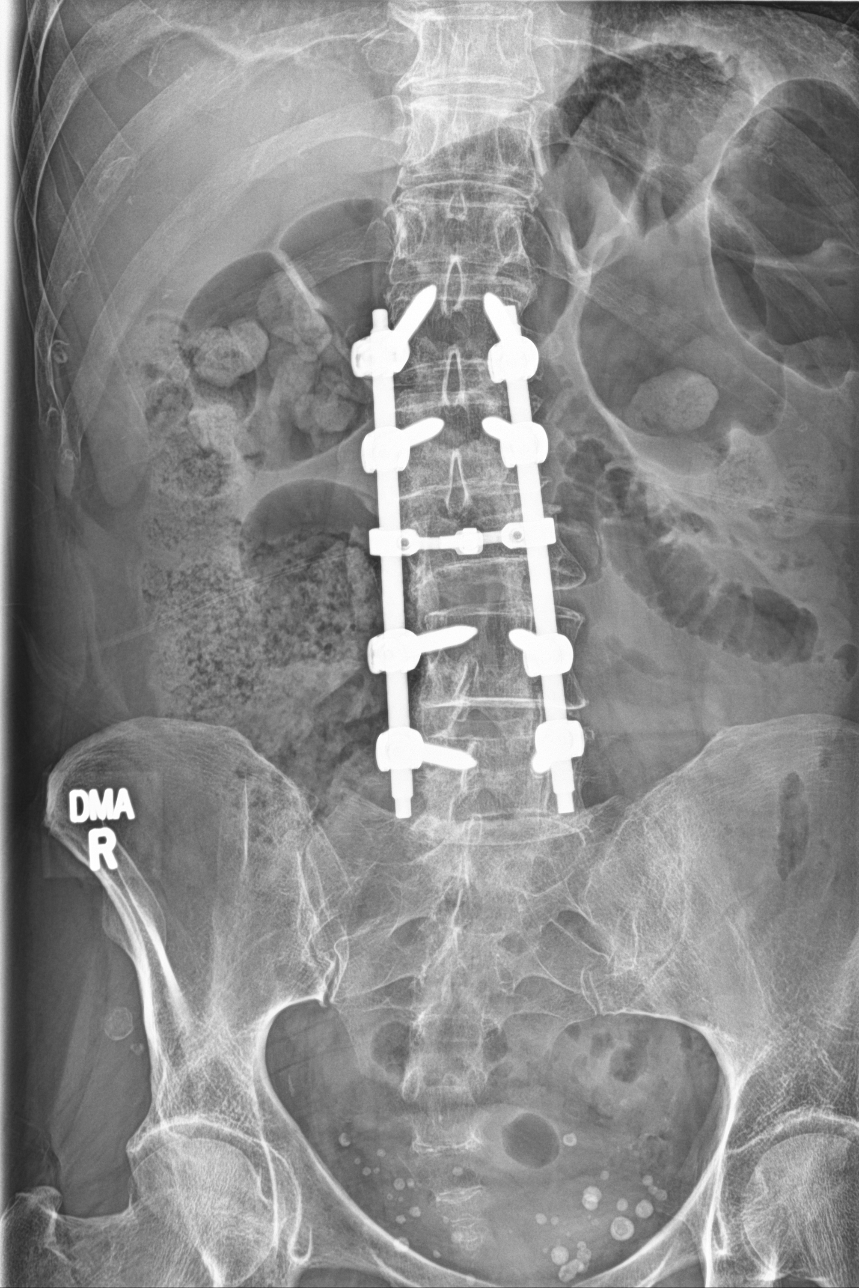

[l-spine lat]
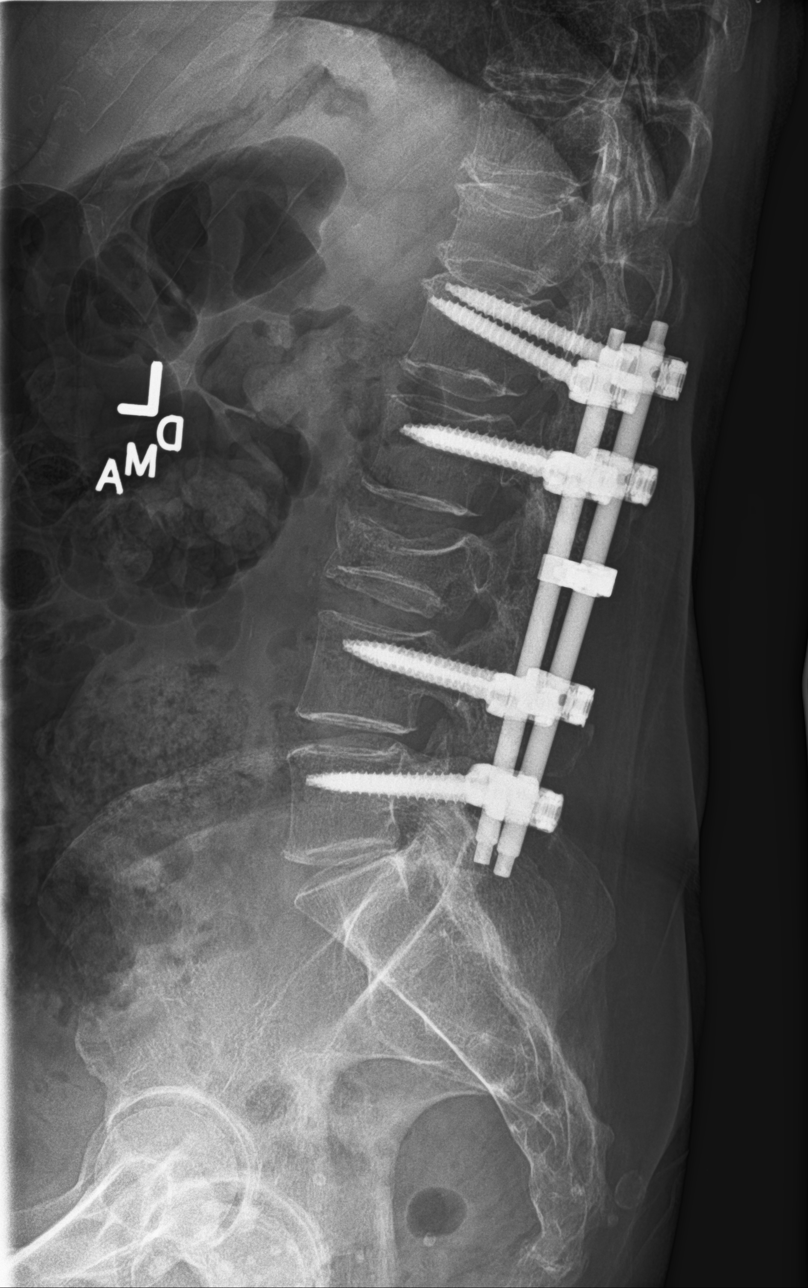

[l-spine spot]
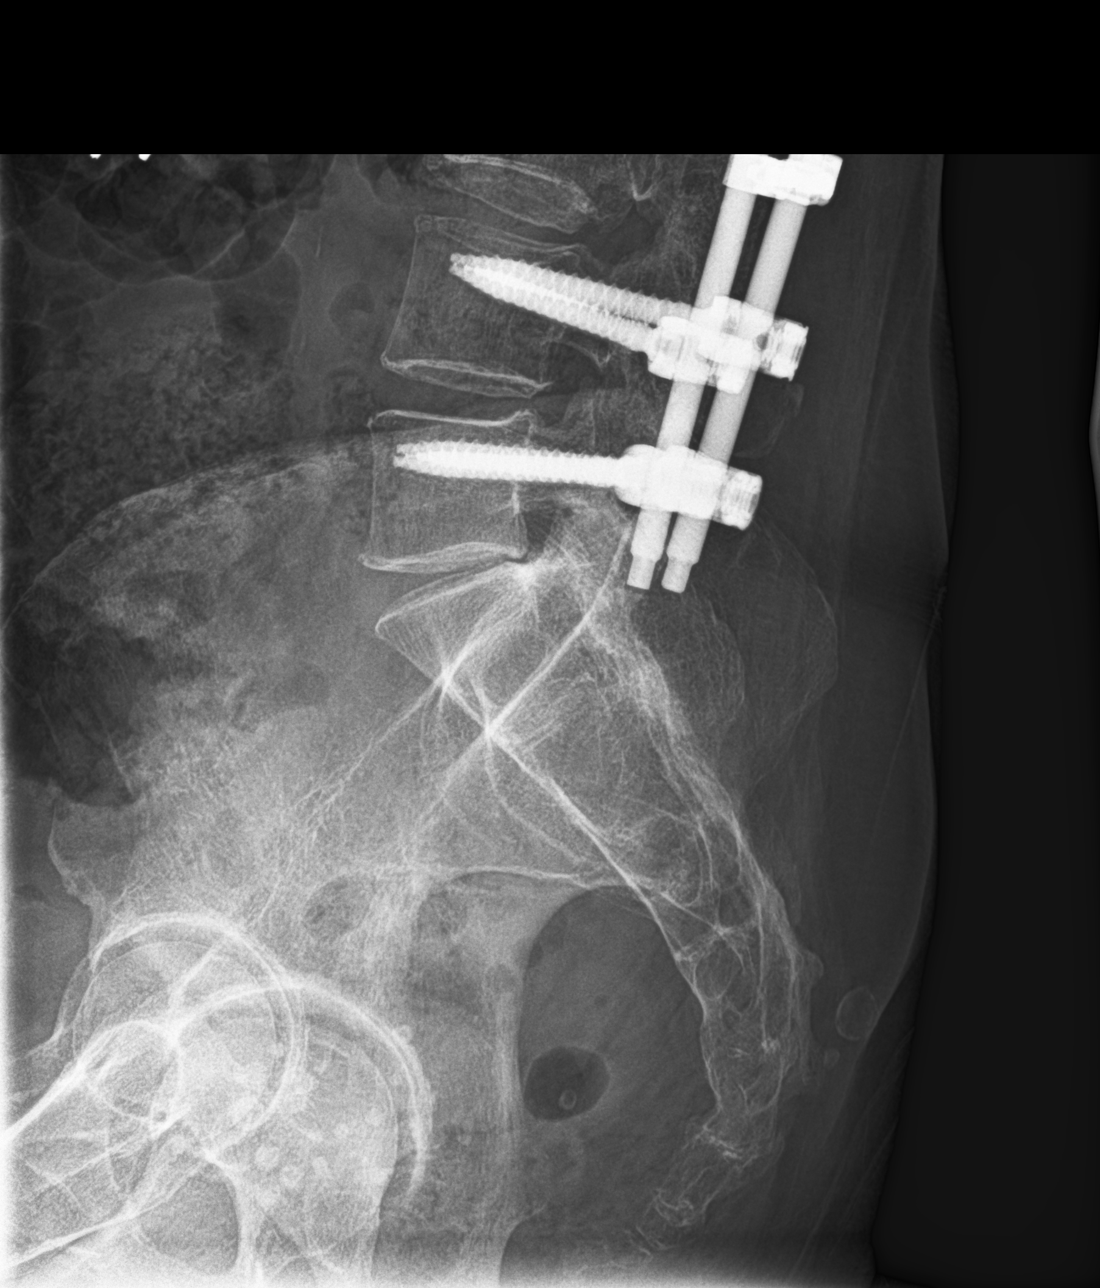

[5 of 5 positions shown; findings below may reference images not displayed]

FINDINGS: The patient is status post L1-5 fusion with pedicle screws and
stabilization bars in place. Remote L3 compression fracture is
unchanged. No other fracture is identified. Alignment is normal.
Large stool burden in the colon is noted.
IMPRESSION: No acute finding.

Remote L3 compression fracture with postoperative change of L1-5
fusion.

Constipation.

## 2014-11-01 ENCOUNTER — Emergency Department: Payer: Self-pay | Admitting: Emergency Medicine

## 2014-11-01 IMAGING — CR DG LUMBAR SPINE COMPLETE 4+V
1 series · 5 of 5 positions shown · non-contrast
Comparison: Lumbar spine radiographs [DATE]

CLINICAL DATA: Fell from bed at 10 a.m., landing on mid back. Mid
back pain. History of prior back surgeries. No leg numbness or
incontinence.

EXAM:
LUMBAR SPINE - COMPLETE 4+ VIEW

[Series 1: dxr lumbar spine with obliques · 0.14mm/px · 5 of 5 slices shown]
[im 1/5]
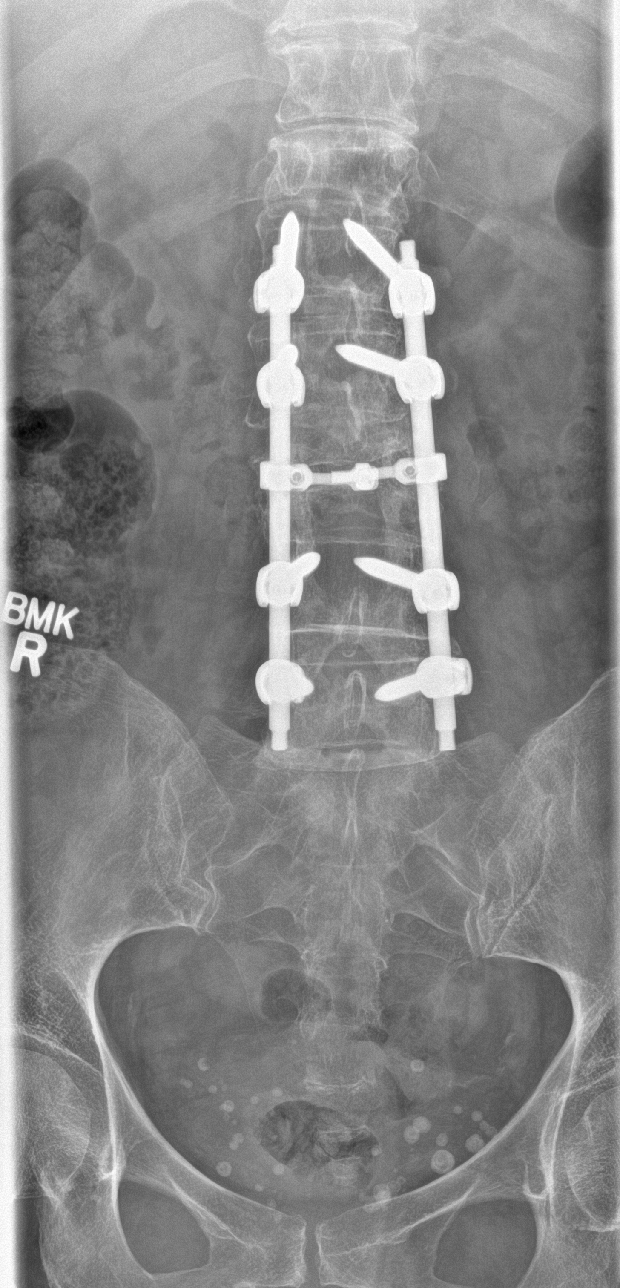
[im 2/5]
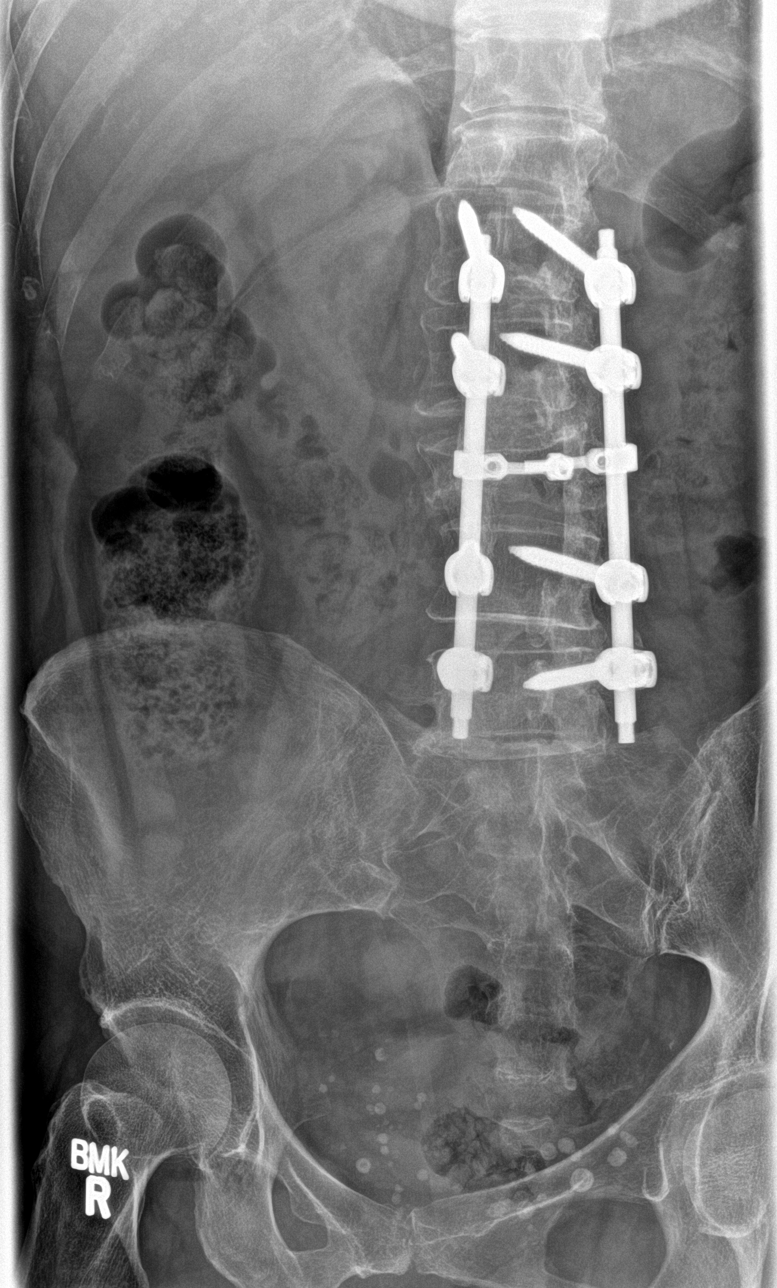
[im 3/5]
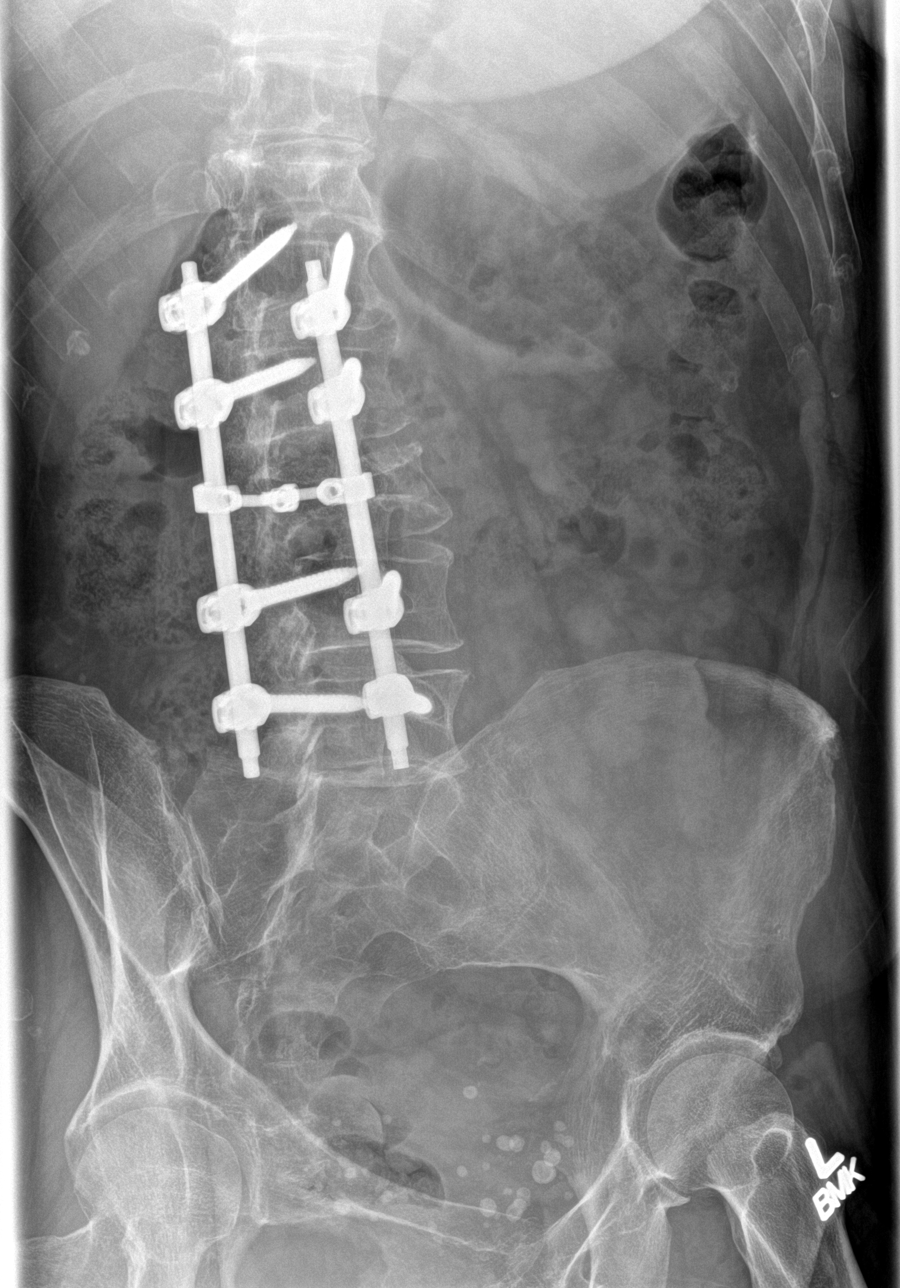
[im 4/5]
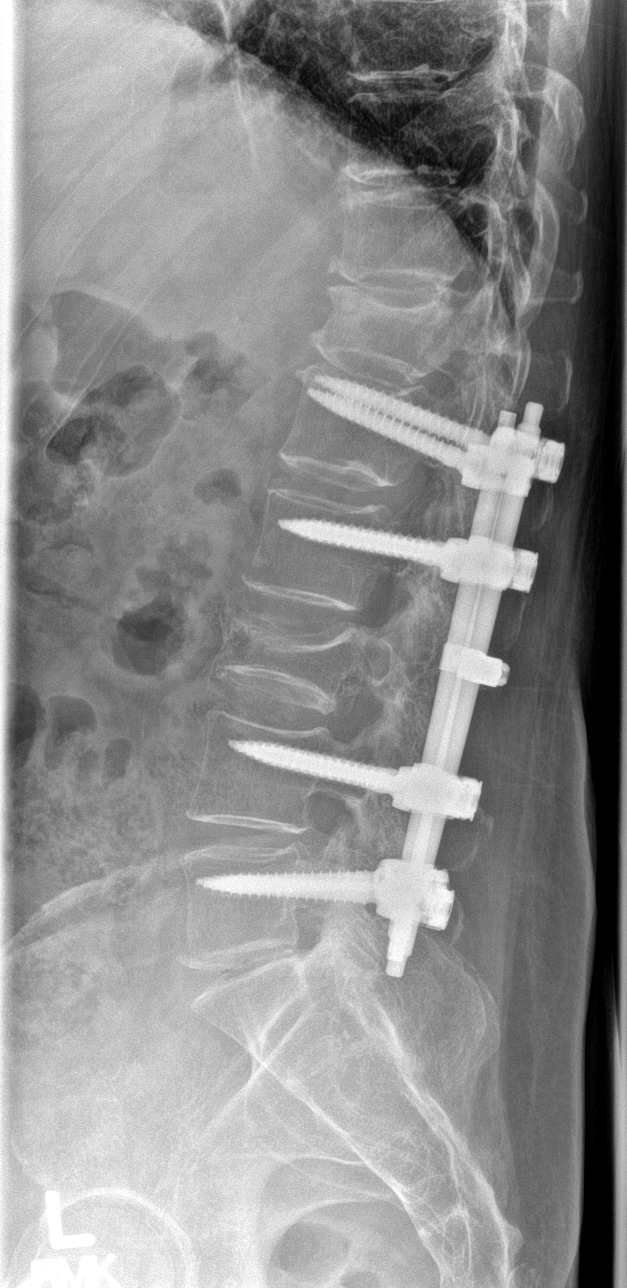
[im 5/5]
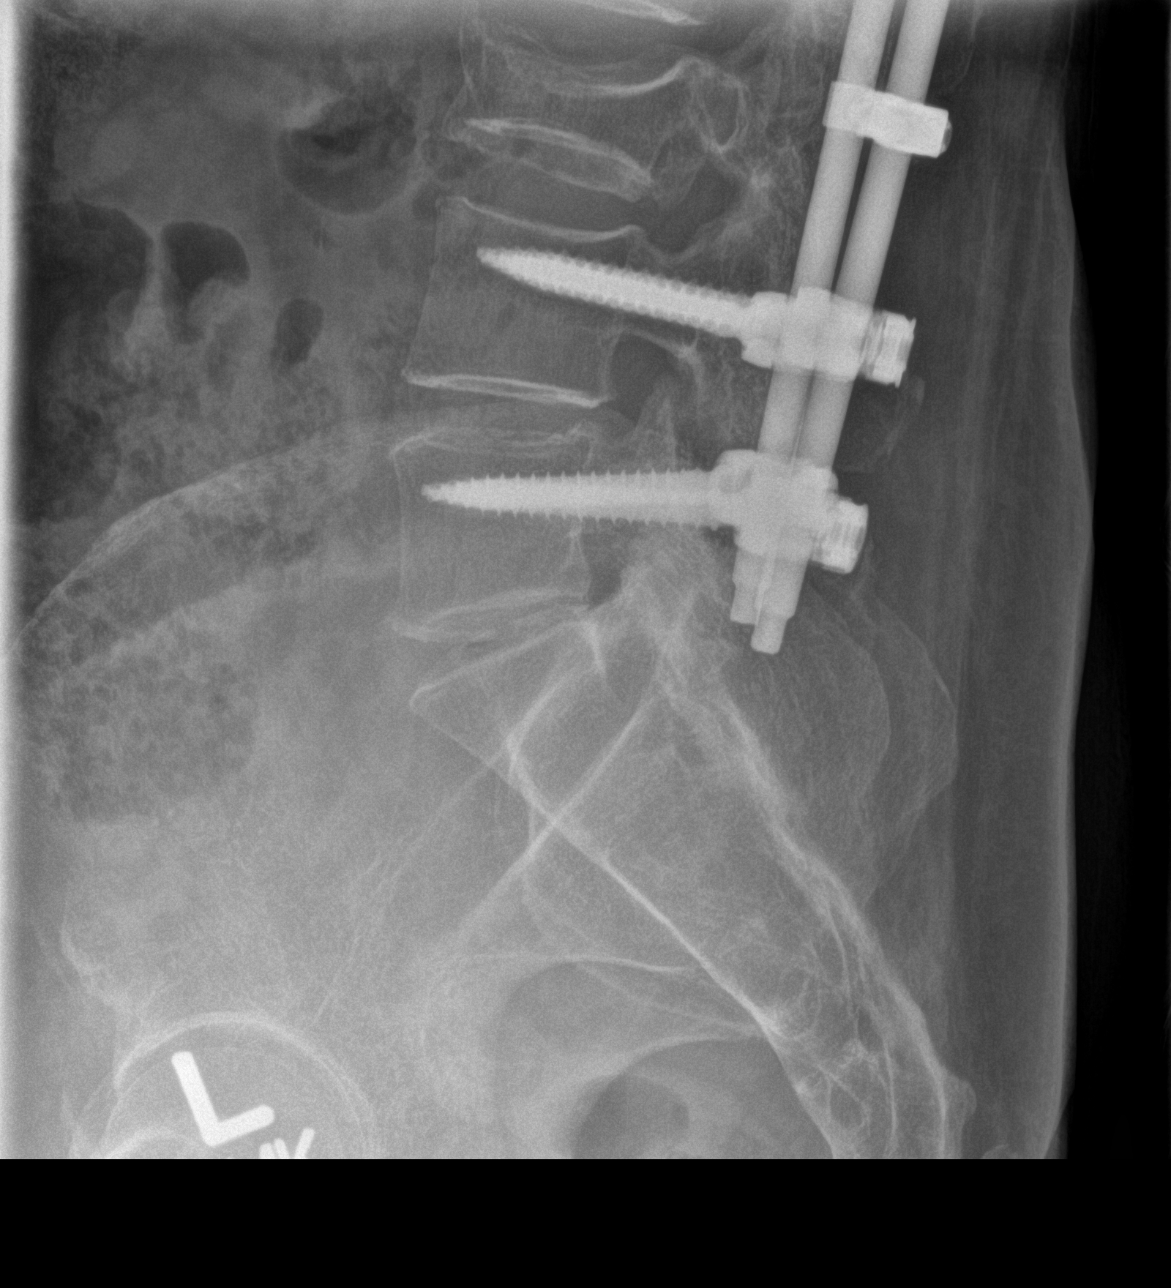

[5 of 5 positions shown; findings below may reference images not displayed]

FINDINGS: Status post L1 through L5 posterior instrument tray shin transfixing
remote moderate to severe L3 compression fracture. Mild T12
compression fracture was present previous of though, there is
progressed height loss, approximately 50% overall height loss. Bone
mineral density is decreased without destructive bony lesions.
Phleboliths in the pelvis. Sacroiliac joints are symmetric. Included
prevertebral and paraspinal soft tissue planes are nonsuspicious.
IMPRESSION: Mildly progressed T12 height loss may reflect re-injury, resulting
in moderate compression fracture in a background of osteopenia.

Status post L1 through L5 posterior instrumentation, remote L3
moderate to severe compression fracture.

  By: BIGATTI

## 2014-11-01 IMAGING — CR DG THORACIC SPINE 2-3V
1 series · 3 of 3 positions shown · non-contrast
Comparison: None.

CLINICAL DATA: Fell from bed at 10 a.m., landing on mid back. Mid
back pain. History of prior back surgeries. No leg numbness or
incontinence.

EXAM:
THORACIC SPINE - 2 VIEW

[Series 1: dxr thoracic  ap and lateral · 0.14mm/px · 3 of 3 slices shown]
[im 1/3]
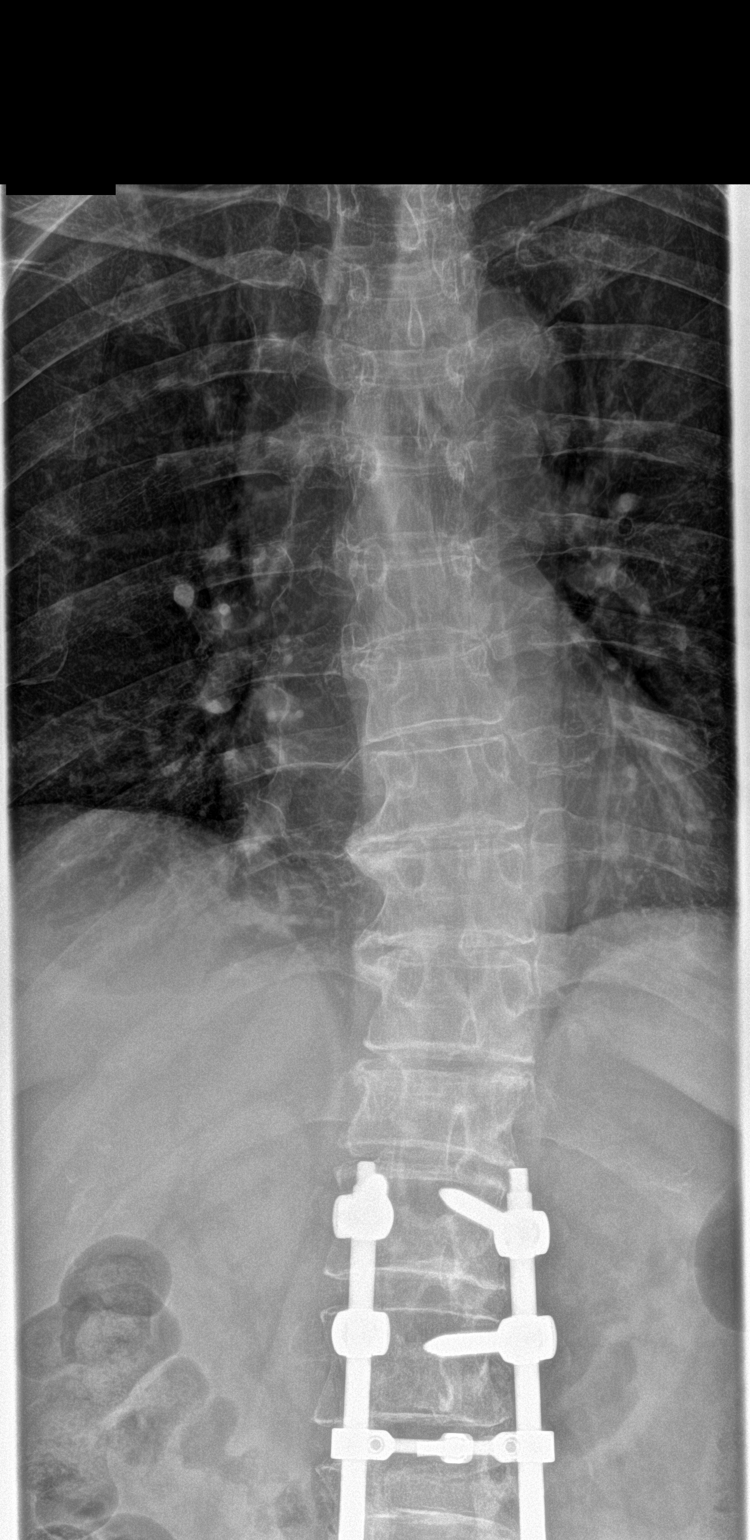
[im 2/3]
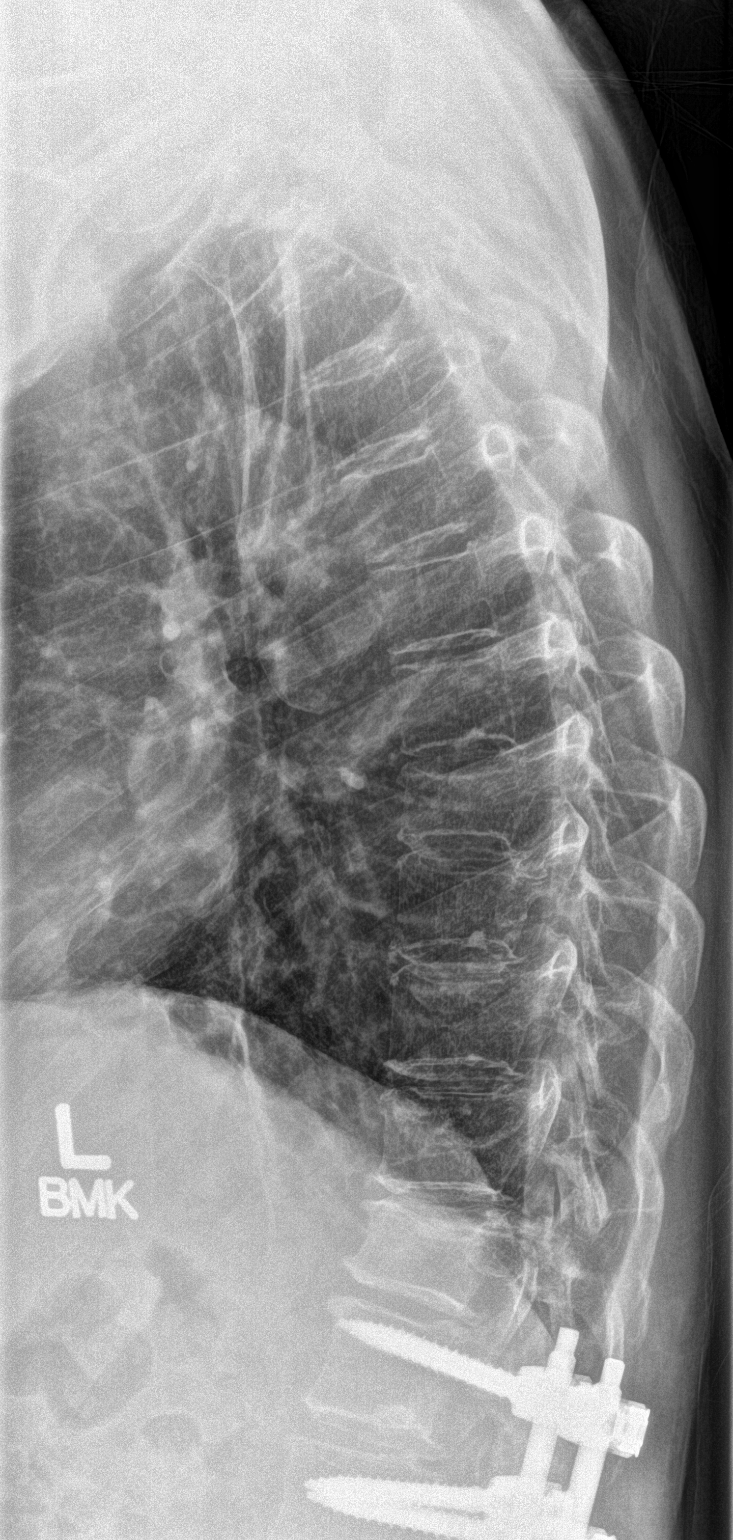
[im 3/3]
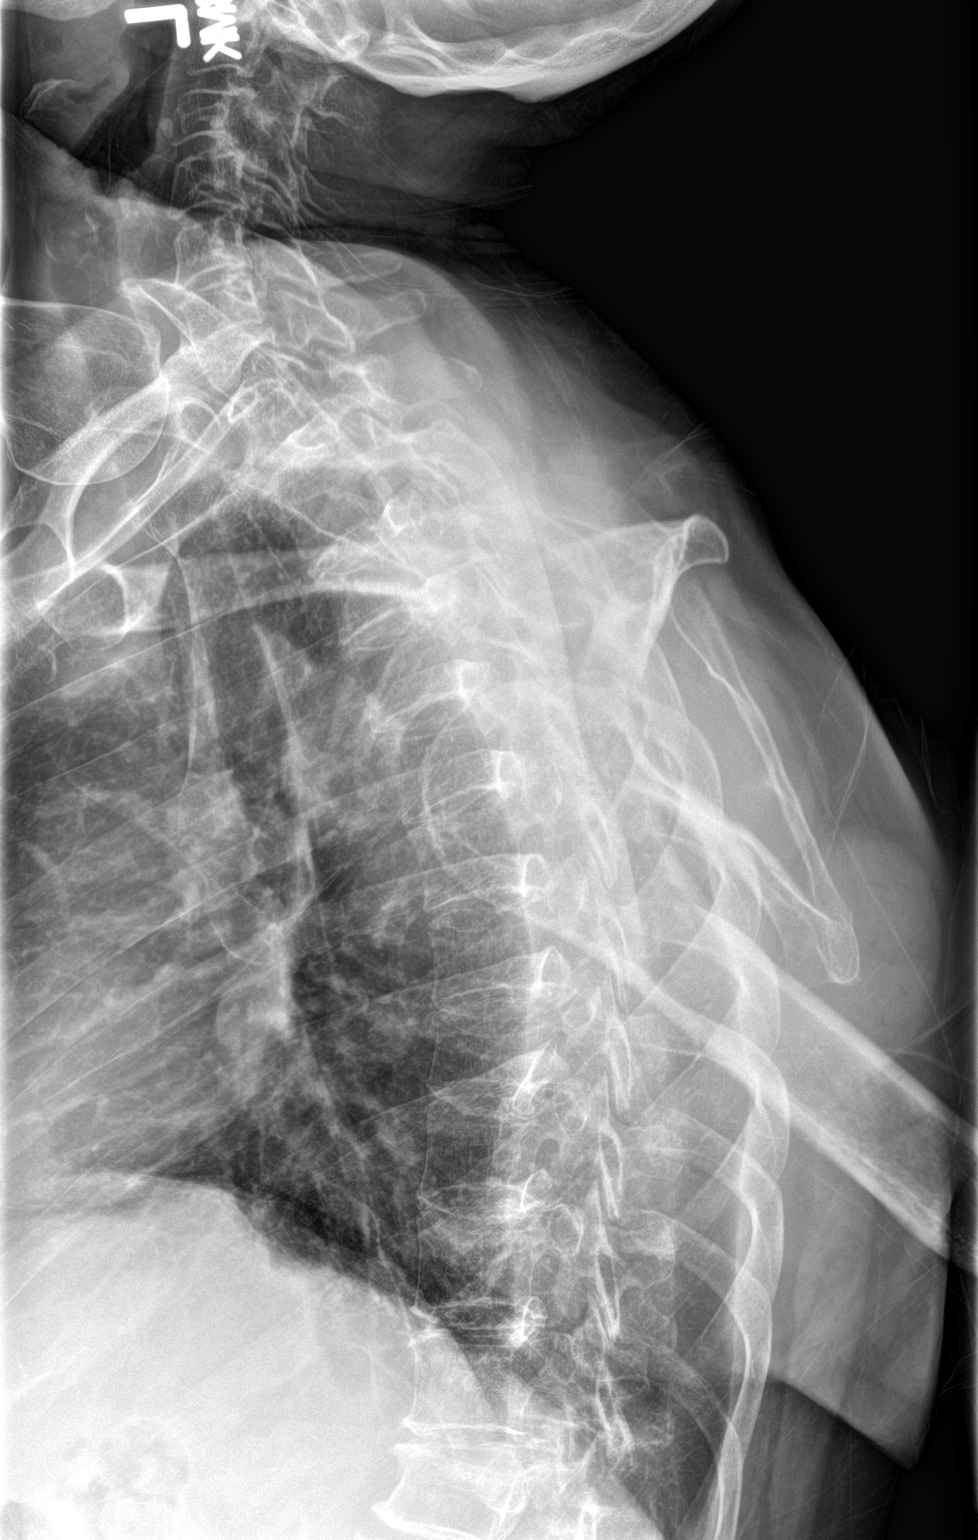

[3 of 3 positions shown; findings below may reference images not displayed]

FINDINGS: Mild T8 compression fracture, moderate T12 compression fracture.
Remaining thoracic vertebral bodies appear intact. Maintenance of
the thoracic kyphosis. No malalignment. Bone mineral density
decreased without destructive bony lesions. Mild broad lower
thoracic levoscoliosis. Partially imaged lumbar instrumentation.
Included prevertebral and paraspinal soft tissue planes are
nonsuspicious.
IMPRESSION: Mild T8, moderate T12 age indeterminate compression fractures, in a
background of osteopenia. If clinical concern for acute injury, MRI
with T1 and STIR sequences would be more sensitive.

  By: IMABEL

## 2014-11-28 ENCOUNTER — Ambulatory Visit
Admit: 2014-11-28 | Disposition: A | Discharge status: Home / Self Care | Payer: Self-pay | Attending: Gastroenterology | Admitting: Gastroenterology

## 2014-11-28 ENCOUNTER — Other Ambulatory Visit: Payer: Self-pay

## 2014-11-28 LAB — HM COLONOSCOPY

## 2014-11-28 NOTE — Patient Outreach (Signed)
[place] [facility]) Care Management  11/28/2014  Teresa Sherman 1946-08-13 [id number]   RN CM spoke with patient to discuss the services of [facility].  Patient is agreeable to services and wishes to schedule an appointment.  RN CM will have welcome packet sent to patient.  Patient will sign consent form and return after signing.  Patient agreeable to next scheduled call on 12-02-14 in morning.    [name], RN, Ishmael Holter, [place] Telephonic Care Coordinator [phone]

## 2014-12-02 ENCOUNTER — Other Ambulatory Visit: Payer: Self-pay

## 2014-12-02 NOTE — Patient Outreach (Signed)
[place] [facility]) Care Management  12/02/2014  Teresa Sherman 03/10/1946 [id number]   RN CM completed assessment on patient.  Patient has agreed to a scheduled call next month.  Patient and RN CM collaborated on a plan of care for high cholesterol. RN CM will send a welcome letter, EMMI educational material and packet.  RN CM will notify primary physician of [facility] involvement with patient.   [name], RN, Ishmael Holter, [place] Telephonic Care Coordinator [phone]

## 2014-12-23 ENCOUNTER — Ambulatory Visit: Payer: Commercial Managed Care - HMO

## 2014-12-25 ENCOUNTER — Other Ambulatory Visit: Payer: Self-pay

## 2014-12-25 NOTE — Patient Outreach (Signed)
[place] [facility]) Care Management  12/25/2014  Mandy Peeks May 24, 1946 [id number]   RN CM spoke with patient today.  Patient stated last night she had a migraine headache and was feeling a little better to day.  RN CM reviewed with patient goals and care plan.  Patient acknowledges that she is more aware of her food choices to help maintain a healthy cholesterol level.  Patient open to receiving EMMI educational material related to self-managing of her cholesterol.  RN CM will mail information to patient and will discuss at next monthly call.  RN CM helped patient complete consent form for [facility] services.  Instructed patient to mail form back to [facility] as soon as she can.  Patient agrees to schedule another telephonic follow up call for next month.    [name], RN, Ishmael Holter, [place] Telephonic Care Coordinator [phone]

## 2015-01-22 ENCOUNTER — Ambulatory Visit: Payer: Commercial Managed Care - HMO

## 2015-01-26 ENCOUNTER — Other Ambulatory Visit: Payer: Self-pay | Admitting: Internal Medicine

## 2015-01-26 ENCOUNTER — Encounter: Payer: Self-pay | Admitting: Internal Medicine

## 2015-01-26 DIAGNOSIS — G43109 Migraine with aura, not intractable, without status migrainosus: Secondary | ICD-10-CM | POA: Insufficient documentation

## 2015-01-26 DIAGNOSIS — K5900 Constipation, unspecified: Secondary | ICD-10-CM | POA: Insufficient documentation

## 2015-01-26 DIAGNOSIS — N6019 Diffuse cystic mastopathy of unspecified breast: Secondary | ICD-10-CM | POA: Insufficient documentation

## 2015-01-26 DIAGNOSIS — Z792 Long term (current) use of antibiotics: Secondary | ICD-10-CM | POA: Insufficient documentation

## 2015-01-26 DIAGNOSIS — Z86010 Personal history of colonic polyps: Secondary | ICD-10-CM | POA: Insufficient documentation

## 2015-01-26 DIAGNOSIS — D485 Neoplasm of uncertain behavior of skin: Secondary | ICD-10-CM | POA: Insufficient documentation

## 2015-01-26 DIAGNOSIS — E782 Mixed hyperlipidemia: Secondary | ICD-10-CM | POA: Insufficient documentation

## 2015-01-26 HISTORY — DX: Mixed hyperlipidemia: E78.2

## 2015-01-28 ENCOUNTER — Ambulatory Visit: Payer: Self-pay | Admitting: Licensed Clinical Social Worker

## 2015-01-30 ENCOUNTER — Other Ambulatory Visit: Payer: Self-pay

## 2015-01-30 NOTE — Patient Outreach (Signed)
[facility]) Care Management  01/30/2015  Teresa Sherman [birth year] [id number]   RN CM spoke with patient for monthly disease management. Patient states she has had four migraine headaches since our last visit.  Patient states she has an appointment with her neurologist, Dr. Brigitte Pulse soon and she plans to keep the appointment.  Patient has transportation to her appointment.   Patient reports decrease in eating.  States a lot of foods bring on her migraines and she has to be extra careful to read labels.  Patient states her weight is 108 pounds.  Patient states she can not drink supplements as they cause her migraines.  RN CM spoke with patient about foods that are low fat low cholesterol.  Patient states she is willing to try new foods that help with lowering her cholesterol level.  RN CM encouraged patient to make an appointment with her primary physician for her wellness check up.  RN CM will call patient next month and patient agrees to being called.  [name], RN, Ishmael Holter, [place] Telephonic Care Coordinator [phone]

## 2015-02-25 ENCOUNTER — Ambulatory Visit (INDEPENDENT_AMBULATORY_CARE_PROVIDER_SITE_OTHER): Payer: Commercial Managed Care - HMO | Admitting: Licensed Clinical Social Worker

## 2015-02-25 DIAGNOSIS — F4323 Adjustment disorder with mixed anxiety and depressed mood: Secondary | ICD-10-CM | POA: Diagnosis not present
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
## 2015-03-05 ENCOUNTER — Other Ambulatory Visit: Payer: Self-pay

## 2015-03-05 NOTE — Patient Outreach (Signed)
[facility]) Care Management  03/05/2015  Teresa Sherman [birth year] [id number]    RN CM spoke with patient and reinforced goals that have been met in plan of care.  Patient has successfully achieved all goals.  Patient has a workable knowledge and understanding of how to self-manage her cholesterol. Patient has agreed to closing her case. RN CM will close this case and notify patient's primary physician.    [name], RN, Ishmael Holter, [place] Telephonic Care Coordinator [phone]

## 2015-03-06 NOTE — Patient Outreach (Signed)
[facility]) Care Management  03/06/2015  Teresa Sherman 01-18-1946 [id number]   Notification from [name], RN to close case due to patient met goals with [place] Management.  [name]. [place], [place] Management [place] Assistant Phone: [phone] Fax: [phone]

## 2015-03-07 ENCOUNTER — Ambulatory Visit: Payer: Self-pay | Admitting: Psychiatry

## 2015-03-12 ENCOUNTER — Ambulatory Visit: Payer: Self-pay | Admitting: Licensed Clinical Social Worker

## 2015-03-19 ENCOUNTER — Encounter: Payer: Self-pay | Admitting: Internal Medicine

## 2015-03-19 ENCOUNTER — Ambulatory Visit (INDEPENDENT_AMBULATORY_CARE_PROVIDER_SITE_OTHER): Payer: Commercial Managed Care - HMO | Admitting: Internal Medicine

## 2015-03-19 VITALS — BP 110/70 | HR 84 | Temp 97.8°F | Ht 60.0 in | Wt 129.0 lb

## 2015-03-19 DIAGNOSIS — J029 Acute pharyngitis, unspecified: Secondary | ICD-10-CM

## 2015-03-19 MED ORDER — AMOXICILLIN 875 MG PO TABS: 875.0000 mg | ORAL_TABLET | Freq: Two times a day (BID) | ORAL | Status: DC

## 2015-03-19 NOTE — Patient Instructions (Signed)
Upper Respiratory Infection, Adult An upper respiratory infection (URI) is also sometimes known as the common cold. The upper respiratory tract includes the nose, sinuses, throat, trachea, and bronchi. Bronchi are the airways leading to the lungs. Most people improve within 1 week, but symptoms can last up to 2 weeks. A residual cough may last even longer.  CAUSES Many different viruses can infect the tissues lining the upper respiratory tract. The tissues become irritated and inflamed and often become very moist. Mucus production is also common. A cold is contagious. You can easily spread the virus to others by oral contact. This includes kissing, sharing a glass, coughing, or sneezing. Touching your mouth or nose and then touching a surface, which is then touched by another person, can also spread the virus. SYMPTOMS  Symptoms typically develop 1 to 3 days after you come in contact with a cold virus. Symptoms vary from person to person. They may include:  Runny nose.  Sneezing.  Nasal congestion.  Sinus irritation.  Sore throat.  Loss of voice (laryngitis).  Cough.  Fatigue.  Muscle aches.  Loss of appetite.  Headache.  Low-grade fever. DIAGNOSIS  You might diagnose your own cold based on familiar symptoms, since most people get a cold 2 to 3 times a year. Your caregiver can confirm this based on your exam. Most importantly, your caregiver can check that your symptoms are not due to another disease such as strep throat, sinusitis, pneumonia, asthma, or epiglottitis. Blood tests, throat tests, and X-rays are not necessary to diagnose a common cold, but they may sometimes be helpful in excluding other more serious diseases. Your caregiver will decide if any further tests are required. RISKS AND COMPLICATIONS  You may be at risk for a more severe case of the common cold if you smoke cigarettes, have chronic heart disease (such as heart failure) or lung disease (such as asthma), or if  you have a weakened immune system. The very young and very old are also at risk for more serious infections. Bacterial sinusitis, middle ear infections, and bacterial pneumonia can complicate the common cold. The common cold can worsen asthma and chronic obstructive pulmonary disease (COPD). Sometimes, these complications can require emergency medical care and may be life-threatening. PREVENTION  The best way to protect against getting a cold is to practice good hygiene. Avoid oral or hand contact with people with cold symptoms. Wash your hands often if contact occurs. There is no clear evidence that vitamin C, vitamin E, echinacea, or exercise reduces the chance of developing a cold. However, it is always recommended to get plenty of rest and practice good nutrition. TREATMENT  Treatment is directed at relieving symptoms. There is no cure. Antibiotics are not effective, because the infection is caused by a virus, not by bacteria. Treatment may include:  Increased fluid intake. Sports drinks offer valuable electrolytes, sugars, and fluids.  Breathing heated mist or steam (vaporizer or shower).  Eating chicken soup or other clear broths, and maintaining good nutrition.  Getting plenty of rest.  Using gargles or lozenges for comfort.  Controlling fevers with ibuprofen or acetaminophen as directed by your caregiver.  Increasing usage of your inhaler if you have asthma. Zinc gel and zinc lozenges, taken in the first 24 hours of the common cold, can shorten the duration and lessen the severity of symptoms. Pain medicines may help with fever, muscle aches, and throat pain. A variety of non-prescription medicines are available to treat congestion and runny nose. Your caregiver   can make recommendations and may suggest nasal or lung inhalers for other symptoms.  HOME CARE INSTRUCTIONS   Only take over-the-counter or prescription medicines for pain, discomfort, or fever as directed by your  caregiver.  Use a warm mist humidifier or inhale steam from a shower to increase air moisture. This may keep secretions moist and make it easier to breathe.  Drink enough water and fluids to keep your urine clear or pale yellow.  Rest as needed.  Return to work when your temperature has returned to normal or as your caregiver advises. You may need to stay home longer to avoid infecting others. You can also use a face mask and careful hand washing to prevent spread of the virus. SEEK MEDICAL CARE IF:   After the first few days, you feel you are getting worse rather than better.  You need your caregiver's advice about medicines to control symptoms.  You develop chills, worsening shortness of breath, or brown or red sputum. These may be signs of pneumonia.  You develop yellow or brown nasal discharge or pain in the face, especially when you bend forward. These may be signs of sinusitis.  You develop a fever, swollen neck glands, pain with swallowing, or white areas in the back of your throat. These may be signs of strep throat. SEEK IMMEDIATE MEDICAL CARE IF:   You have a fever.  You develop severe or persistent headache, ear pain, sinus pain, or chest pain.  You develop wheezing, a prolonged cough, cough up blood, or have a change in your usual mucus (if you have chronic lung disease).  You develop sore muscles or a stiff neck. Document Released: 02/02/2001 Document Revised: 11/01/2011 Document Reviewed: 11/14/2013 [employer] Patient Information 2015 [employer]. This information is not intended to replace advice given to you by your health care provider. Make sure you discuss any questions you have with your health care provider.  
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
## 2015-04-30 ENCOUNTER — Ambulatory Visit (INDEPENDENT_AMBULATORY_CARE_PROVIDER_SITE_OTHER): Payer: Commercial Managed Care - HMO | Admitting: Internal Medicine

## 2015-04-30 ENCOUNTER — Encounter: Payer: Self-pay | Admitting: Internal Medicine

## 2015-04-30 VITALS — BP 104/64 | HR 68 | Ht 60.0 in | Wt 130.0 lb

## 2015-04-30 DIAGNOSIS — N6019 Diffuse cystic mastopathy of unspecified breast: Secondary | ICD-10-CM

## 2015-04-30 DIAGNOSIS — Z23 Encounter for immunization: Secondary | ICD-10-CM

## 2015-04-30 DIAGNOSIS — E782 Mixed hyperlipidemia: Secondary | ICD-10-CM | POA: Diagnosis not present

## 2015-04-30 DIAGNOSIS — G43109 Migraine with aura, not intractable, without status migrainosus: Secondary | ICD-10-CM | POA: Diagnosis not present

## 2015-04-30 DIAGNOSIS — F418 Other specified anxiety disorders: Secondary | ICD-10-CM | POA: Diagnosis not present

## 2015-04-30 DIAGNOSIS — Z0000 Encounter for general adult medical examination without abnormal findings: Secondary | ICD-10-CM

## 2015-04-30 NOTE — Patient Instructions (Addendum)
Breast Self-Awareness Practicing breast self-awareness may pick up problems early, prevent significant medical complications, and possibly save your life. By practicing breast self-awareness, you can become familiar with how your breasts look and feel and if your breasts are changing. This allows you to notice changes early. It can also offer you some reassurance that your breast health is good. One way to learn what is normal for your breasts and whether your breasts are changing is to do a breast self-exam. If you find a lump or something that was not present in the past, it is best to contact your caregiver right away. Other findings that should be evaluated by your caregiver include nipple discharge, especially if it is bloody; skin changes or reddening; areas where the skin seems to be pulled in (retracted); or new lumps and bumps. Breast pain is seldom associated with cancer (malignancy), but should also be evaluated by a caregiver. HOW TO PERFORM A BREAST SELF-EXAM The best time to examine your breasts is 5-7 days after your menstrual period is over. During menstruation, the breasts are lumpier, and it may be more difficult to pick up changes. If you do not menstruate, have reached menopause, or had your uterus removed (hysterectomy), you should examine your breasts at regular intervals, such as monthly. If you are breastfeeding, examine your breasts after a feeding or after using a breast pump. Breast implants do not decrease the risk for lumps or tumors, so continue to perform breast self-exams as recommended. Talk to your caregiver about how to determine the difference between the implant and breast tissue. Also, talk about the amount of pressure you should use during the exam. Over time, you will become more familiar with the variations of your breasts and more comfortable with the exam. A breast self-exam requires you to remove all your clothes above the waist. 1. Look at your breasts and nipples.  Stand in front of a mirror in a room with good lighting. With your hands on your hips, push your hands firmly downward. Look for a difference in shape, contour, and size from one breast to the other (asymmetry). Asymmetry includes puckers, dips, or bumps. Also, look for skin changes, such as reddened or scaly areas on the breasts. Look for nipple changes, such as discharge, dimpling, repositioning, or redness. 2. Carefully feel your breasts. This is best done either in the shower or tub while using soapy water or when flat on your back. Place the arm (on the side of the breast you are examining) above your head. Use the pads (not the fingertips) of your three middle fingers on your opposite hand to feel your breasts. Start in the underarm area and use  inch (2 cm) overlapping circles to feel your breast. Use 3 different levels of pressure (light, medium, and firm pressure) at each circle before moving to the next circle. The light pressure is needed to feel the tissue closest to the skin. The medium pressure will help to feel breast tissue a little deeper, while the firm pressure is needed to feel the tissue close to the ribs. Continue the overlapping circles, moving downward over the breast until you feel your ribs below your breast. Then, move one finger-width towards the center of the body. Continue to use the  inch (2 cm) overlapping circles to feel your breast as you move slowly up toward the collar bone (clavicle) near the base of the neck. Continue the up and down exam using all 3 pressures until you reach the   middle of the chest. Do this with each breast, carefully feeling for lumps or changes. 3.  Keep a written record with breast changes or normal findings for each breast. By writing this information down, you do not need to depend only on memory for size, tenderness, or location. Write down where you are in your menstrual cycle, if you are still menstruating. Breast tissue can have some lumps or  thick tissue. However, see your caregiver if you find anything that concerns you.  SEEK MEDICAL CARE IF:  You see a change in shape, contour, or size of your breasts or nipples.   You see skin changes, such as reddened or scaly areas on the breasts or nipples.   You have an unusual discharge from your nipples.   You feel a new lump or unusually thick areas.  Document Released: 08/09/2005 Document Revised: 07/26/2012 Document Reviewed: 11/24/2011 [facility] Patient Information 2015 [place], [place]. This information is not intended to replace advice given to you by your health care provider. Make sure you discuss any questions you have with your health care provider.  Health Maintenance  Topic Date Due  . Hepatitis C Screening  [birth year]  . ZOSTAVAX  10/17/2007  . DEXA SCAN  10/16/2012  . MAMMOGRAM  02/13/2015  . INFLUENZA VACCINE  03/23/2016  . TETANUS/TDAP  03/13/2023  . COLONOSCOPY  11/27/2024  . PNA vac Low Risk Adult  Completed
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
## 2015-05-01 LAB — COMPREHENSIVE METABOLIC PANEL
A/G RATIO: 1.7 (ref 1.1–2.5)
ALT: 15 IU/L (ref 0–32)
AST: 14 IU/L (ref 0–40)
Albumin: 4.3 g/dL (ref 3.6–4.8)
Alkaline Phosphatase: 76 IU/L (ref 39–117)
BILIRUBIN TOTAL: 0.4 mg/dL (ref 0.0–1.2)
BUN/Creatinine Ratio: 18 (ref 11–26)
BUN: 14 mg/dL (ref 8–27)
CO2: 24 mmol/L (ref 18–29)
Calcium: 9.4 mg/dL (ref 8.7–10.3)
Chloride: 98 mmol/L (ref 97–108)
Creatinine, Ser: 0.79 mg/dL (ref 0.57–1.00)
GFR calc Af Amer: 90 mL/min/{1.73_m2} (ref >59)
GFR calc non Af Amer: 78 mL/min/{1.73_m2} (ref >59)
Globulin, Total: 2.6 g/dL (ref 1.5–4.5)
Glucose: 81 mg/dL (ref 65–99)
POTASSIUM: 3.8 mmol/L (ref 3.5–5.2)
SODIUM: 140 mmol/L (ref 134–144)
Total Protein: 6.9 g/dL (ref 6.0–8.5)

## 2015-05-01 LAB — CBC WITH DIFFERENTIAL/PLATELET
Basophils Absolute: 0 10*3/uL (ref 0.0–0.2)
Basos: 0 %
EOS (ABSOLUTE): 0.1 10*3/uL (ref 0.0–0.4)
EOS: 2 %
HEMATOCRIT: 41.3 % (ref 34.0–46.6)
Hemoglobin: 14.2 g/dL (ref 11.1–15.9)
Immature Grans (Abs): 0 10*3/uL (ref 0.0–0.1)
Immature Granulocytes: 0 %
Lymphocytes Absolute: 1.9 10*3/uL (ref 0.7–3.1)
Lymphs: 40 %
MCH: 30.9 pg (ref 26.6–33.0)
MCHC: 34.4 g/dL (ref 31.5–35.7)
MCV: 90 fL (ref 79–97)
Monocytes Absolute: 0.4 10*3/uL (ref 0.1–0.9)
Monocytes: 8 %
NEUTROS ABS: 2.3 10*3/uL (ref 1.4–7.0)
Neutrophils: 50 %
Platelets: 229 10*3/uL (ref 150–379)
RBC: 4.59 x10E6/uL (ref 3.77–5.28)
RDW: 14.2 % (ref 12.3–15.4)
WBC: 4.7 10*3/uL (ref 3.4–10.8)

## 2015-05-01 LAB — LIPID PANEL
Chol/HDL Ratio: 3.8 ratio units (ref 0.0–4.4)
Cholesterol, Total: 244 mg/dL — ABNORMAL HIGH (ref 100–199)
HDL: 65 mg/dL (ref >39)
LDL Calculated: 158 mg/dL — ABNORMAL HIGH (ref 0–99)
TRIGLYCERIDES: 103 mg/dL (ref 0–149)
VLDL Cholesterol Cal: 21 mg/dL (ref 5–40)

## 2015-05-01 LAB — TSH: TSH: 1.22 u[IU]/mL (ref 0.450–4.500)

## 2015-06-05 ENCOUNTER — Other Ambulatory Visit: Payer: Self-pay | Admitting: Neurosurgery

## 2015-06-06 ENCOUNTER — Other Ambulatory Visit: Payer: Self-pay | Admitting: Neurosurgery

## 2015-06-06 ENCOUNTER — Ambulatory Visit
Admission: RE | Admit: 2015-06-06 | Discharge: 2015-06-06 | Disposition: A | Discharge status: Home / Self Care | Payer: Commercial Managed Care - HMO | Source: Ambulatory Visit | Attending: Neurosurgery | Admitting: Neurosurgery

## 2015-06-06 DIAGNOSIS — X58XXXA Exposure to other specified factors, initial encounter: Secondary | ICD-10-CM | POA: Insufficient documentation

## 2015-06-06 DIAGNOSIS — M5440 Lumbago with sciatica, unspecified side: Secondary | ICD-10-CM

## 2015-06-06 DIAGNOSIS — S22089A Unspecified fracture of T11-T12 vertebra, initial encounter for closed fracture: Secondary | ICD-10-CM | POA: Diagnosis not present

## 2015-06-06 DIAGNOSIS — M545 Low back pain: Secondary | ICD-10-CM | POA: Diagnosis present

## 2015-06-06 DIAGNOSIS — S32039A Unspecified fracture of third lumbar vertebra, initial encounter for closed fracture: Secondary | ICD-10-CM | POA: Insufficient documentation
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
## 2015-09-19 DIAGNOSIS — G43109 Migraine with aura, not intractable, without status migrainosus: Secondary | ICD-10-CM | POA: Diagnosis not present

## 2015-10-07 DIAGNOSIS — L650 Telogen effluvium: Secondary | ICD-10-CM | POA: Diagnosis not present

## 2015-10-07 DIAGNOSIS — L905 Scar conditions and fibrosis of skin: Secondary | ICD-10-CM | POA: Diagnosis not present

## 2015-10-07 DIAGNOSIS — L649 Androgenic alopecia, unspecified: Secondary | ICD-10-CM | POA: Diagnosis not present

## 2015-11-10 DIAGNOSIS — H02403 Unspecified ptosis of bilateral eyelids: Secondary | ICD-10-CM | POA: Diagnosis not present

## 2015-11-14 DIAGNOSIS — J301 Allergic rhinitis due to pollen: Secondary | ICD-10-CM | POA: Diagnosis not present

## 2015-12-07 ENCOUNTER — Encounter: Payer: Self-pay | Admitting: Emergency Medicine

## 2015-12-07 ENCOUNTER — Ambulatory Visit
Admission: EM | Admit: 2015-12-07 | Discharge: 2015-12-07 | Disposition: A | Discharge status: Home / Self Care | Payer: PPO | Attending: Family Medicine | Admitting: Family Medicine

## 2015-12-07 DIAGNOSIS — G43009 Migraine without aura, not intractable, without status migrainosus: Secondary | ICD-10-CM

## 2015-12-07 MED ORDER — ONDANSETRON 4 MG PO TBDP
4.0000 mg | ORAL_TABLET | Freq: Once | ORAL | Status: AC
  Administered 2015-12-07: 4 mg via ORAL

## 2015-12-07 MED ORDER — KETOROLAC TROMETHAMINE 60 MG/2ML IM SOLN
30.0000 mg | Freq: Once | INTRAMUSCULAR | Status: AC
  Administered 2015-12-07: 30 mg via INTRAMUSCULAR

## 2015-12-07 NOTE — Discharge Instructions (Signed)
Migraine Headache °A migraine headache is an intense, throbbing pain on one or both sides of your head. A migraine can last for 30 minutes to several hours. °CAUSES  °The exact cause of a migraine headache is not always known. However, a migraine may be caused when nerves in the brain become irritated and release chemicals that cause inflammation. This causes pain. °Certain things may also trigger migraines, such as: °· Alcohol. °· Smoking. °· Stress. °· Menstruation. °· Aged cheeses. °· Foods or drinks that contain nitrates, glutamate, aspartame, or tyramine. °· Lack of sleep. °· Chocolate. °· Caffeine. °· Hunger. °· Physical exertion. °· Fatigue. °· Medicines used to treat chest pain (nitroglycerine), birth control pills, estrogen, and some blood pressure medicines. °SIGNS AND SYMPTOMS °· Pain on one or both sides of your head. °· Pulsating or throbbing pain. °· Severe pain that prevents daily activities. °· Pain that is aggravated by any physical activity. °· Nausea, vomiting, or both. °· Dizziness. °· Pain with exposure to bright lights, loud noises, or activity. °· General sensitivity to bright lights, loud noises, or smells. °Before you get a migraine, you may get warning signs that a migraine is coming (aura). An aura may include: °· Seeing flashing lights. °· Seeing bright spots, halos, or zigzag lines. °· Having tunnel vision or blurred vision. °· Having feelings of numbness or tingling. °· Having trouble talking. °· Having muscle weakness. °DIAGNOSIS  °A migraine headache is often diagnosed based on: °· Symptoms. °· Physical exam. °· A CT scan or MRI of your head. These imaging tests cannot diagnose migraines, but they can help rule out other causes of headaches. °TREATMENT °Medicines may be given for pain and nausea. Medicines can also be given to help prevent recurrent migraines.  °HOME CARE INSTRUCTIONS °· Only take over-the-counter or prescription medicines for pain or discomfort as directed by your  health care provider. The use of long-term narcotics is not recommended. °· Lie down in a dark, quiet room when you have a migraine. °· Keep a journal to find out what may trigger your migraine headaches. For example, write down: °¨ What you eat and drink. °¨ How much sleep you get. °¨ Any change to your diet or medicines. °· Limit alcohol consumption. °· Quit smoking if you smoke. °· Get 7-9 hours of sleep, or as recommended by your health care provider. °· Limit stress. °· Keep lights dim if bright lights bother you and make your migraines worse. °SEEK IMMEDIATE MEDICAL CARE IF:  °· Your migraine becomes severe. °· You have a fever. °· You have a stiff neck. °· You have vision loss. °· You have muscular weakness or loss of muscle control. °· You start losing your balance or have trouble walking. °· You feel faint or pass out. °· You have severe symptoms that are different from your first symptoms. °MAKE SURE YOU:  °· Understand these instructions. °· Will watch your condition. °· Will get help right away if you are not doing well or get worse. °  °This information is not intended to replace advice given to you by your health care provider. Make sure you discuss any questions you have with your health care provider. °  °Document Released: 08/09/2005 Document Revised: 08/30/2014 Document Reviewed: 04/16/2013 °Elsevier Interactive Patient Education ©2016 Else[employer] Inc[employer] ° °Recurrent Migraine Headache °A migraine headache is an intense, throbbing pain on one or both sides of your head. Recurrent migraines keep coming back. A migraine can last for 30 minutes to several hours. °  CAUSES  °The exact cause of a migraine headache is not always known. However, a migraine may be caused when nerves in the brain become irritated and release chemicals that cause inflammation. This causes pain. °Certain things may also trigger migraines, such as:  °· Alcohol. °· Smoking. °· Stress. °· Menstruation. °· Aged cheeses. °· Foods or  drinks that contain nitrates, glutamate, aspartame, or tyramine. °· Lack of sleep. °· Chocolate. °· Caffeine. °· Hunger. °· Physical exertion. °· Fatigue. °· Medicines used to treat chest pain (nitroglycerine), birth control pills, estrogen, and some blood pressure medicines. °SYMPTOMS  °· Pain on one or both sides of your head. °· Pulsating or throbbing pain. °· Severe pain that prevents daily activities. °· Pain that is aggravated by any physical activity. °· Nausea, vomiting, or both. °· Dizziness. °· Pain with exposure to bright lights, loud noises, or activity. °· General sensitivity to bright lights, loud noises, or smells. °Before you get a migraine, you may get warning signs that a migraine is coming (aura). An aura may include: °· Seeing flashing lights. °· Seeing bright spots, halos, or zigzag lines. °· Having tunnel vision or blurred vision. °· Having feelings of numbness or tingling. °· Having trouble talking. °· Having muscle weakness. °DIAGNOSIS  °A recurrent migraine headache is often diagnosed based on: °· Symptoms. °· Physical examination. °· A CT scan or MRI of your head. These imaging tests cannot diagnose migraines but can help rule out other causes of headaches.   °TREATMENT  °Medicines may be given for pain and nausea. Medicines can also be given to help prevent recurrent migraines. °HOME CARE INSTRUCTIONS °· Only take over-the-counter or prescription medicines for pain or discomfort as directed by your health care provider. The use of long-term narcotics is not recommended. °· Lie down in a dark, quiet room when you have a migraine. °· Keep a journal to find out what may trigger your migraine headaches. For example, write down: °¨ What you eat and drink. °¨ How much sleep you get. °¨ Any change to your diet or medicines. °· Limit alcohol consumption. °· Quit smoking if you smoke. °· Get 7-9 hours of sleep, or as recommended by your health care provider. °· Limit stress. °· Keep lights dim if  bright lights bother you and make your migraines worse. °SEEK MEDICAL CARE IF:  °· You do not get relief from the medicines given to you. °· You have a recurrence of pain. °· You have a fever. °SEEK IMMEDIATE MEDICAL CARE IF: °· Your migraine becomes severe. °· You have a stiff neck. °· You have loss of vision. °· You have muscular weakness or loss of muscle control. °· You start losing your balance or have trouble walking. °· You feel faint or pass out. °· You have severe symptoms that are different from your first symptoms. °MAKE SURE YOU:  °· Understand these instructions. °· Will watch your condition. °· Will get help right away if you are not doing well or get worse. °  °This information is not intended to replace advice given to you by your health care provider. Make sure you discuss any questions you have with your health care provider. °  °Document Released: 05/04/2001 Document Revised: 08/30/2014 Document Reviewed: 04/16/2013 °Elsevier Interactive Patient Education ©2016 Elsevier Inc. ° °

## 2015-12-07 NOTE — ED Notes (Signed)
Patient c/o past history of migraine. Per patient migraine x 5 days. Patient also had all her medication mix together.
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
## 2015-12-08 DIAGNOSIS — G43719 Chronic migraine without aura, intractable, without status migrainosus: Secondary | ICD-10-CM | POA: Diagnosis not present

## 2015-12-16 DIAGNOSIS — G43709 Chronic migraine without aura, not intractable, without status migrainosus: Secondary | ICD-10-CM | POA: Diagnosis not present

## 2015-12-23 DIAGNOSIS — G43719 Chronic migraine without aura, intractable, without status migrainosus: Secondary | ICD-10-CM | POA: Diagnosis not present

## 2015-12-26 DIAGNOSIS — F4310 Post-traumatic stress disorder, unspecified: Secondary | ICD-10-CM | POA: Diagnosis not present

## 2015-12-29 DIAGNOSIS — L989 Disorder of the skin and subcutaneous tissue, unspecified: Secondary | ICD-10-CM | POA: Diagnosis not present

## 2015-12-31 DIAGNOSIS — G43719 Chronic migraine without aura, intractable, without status migrainosus: Secondary | ICD-10-CM | POA: Diagnosis not present

## 2016-01-23 DIAGNOSIS — F4310 Post-traumatic stress disorder, unspecified: Secondary | ICD-10-CM | POA: Diagnosis not present

## 2016-02-04 DIAGNOSIS — R208 Other disturbances of skin sensation: Secondary | ICD-10-CM | POA: Diagnosis not present

## 2016-02-04 DIAGNOSIS — L820 Inflamed seborrheic keratosis: Secondary | ICD-10-CM | POA: Diagnosis not present

## 2016-02-04 DIAGNOSIS — R234 Changes in skin texture: Secondary | ICD-10-CM | POA: Diagnosis not present

## 2016-02-04 DIAGNOSIS — L905 Scar conditions and fibrosis of skin: Secondary | ICD-10-CM | POA: Diagnosis not present

## 2016-03-01 DIAGNOSIS — F4310 Post-traumatic stress disorder, unspecified: Secondary | ICD-10-CM | POA: Diagnosis not present

## 2016-03-18 DIAGNOSIS — G43719 Chronic migraine without aura, intractable, without status migrainosus: Secondary | ICD-10-CM | POA: Diagnosis not present

## 2016-04-23 DIAGNOSIS — F4310 Post-traumatic stress disorder, unspecified: Secondary | ICD-10-CM | POA: Diagnosis not present

## 2016-05-04 DIAGNOSIS — G43109 Migraine with aura, not intractable, without status migrainosus: Secondary | ICD-10-CM | POA: Diagnosis not present

## 2016-05-04 DIAGNOSIS — G43719 Chronic migraine without aura, intractable, without status migrainosus: Secondary | ICD-10-CM | POA: Diagnosis not present

## 2016-05-06 DIAGNOSIS — Z23 Encounter for immunization: Secondary | ICD-10-CM | POA: Diagnosis not present

## 2016-05-10 DIAGNOSIS — G43719 Chronic migraine without aura, intractable, without status migrainosus: Secondary | ICD-10-CM | POA: Diagnosis not present

## 2016-06-08 DIAGNOSIS — G43109 Migraine with aura, not intractable, without status migrainosus: Secondary | ICD-10-CM | POA: Diagnosis not present

## 2016-06-08 DIAGNOSIS — G43719 Chronic migraine without aura, intractable, without status migrainosus: Secondary | ICD-10-CM | POA: Diagnosis not present

## 2016-07-12 DIAGNOSIS — J00 Acute nasopharyngitis [common cold]: Secondary | ICD-10-CM | POA: Diagnosis not present

## 2016-08-11 DIAGNOSIS — L709 Acne, unspecified: Secondary | ICD-10-CM | POA: Diagnosis not present

## 2016-09-20 DIAGNOSIS — K219 Gastro-esophageal reflux disease without esophagitis: Secondary | ICD-10-CM | POA: Diagnosis not present

## 2016-09-20 DIAGNOSIS — Z0000 Encounter for general adult medical examination without abnormal findings: Secondary | ICD-10-CM | POA: Diagnosis not present

## 2016-09-20 DIAGNOSIS — J3089 Other allergic rhinitis: Secondary | ICD-10-CM | POA: Diagnosis not present

## 2016-09-20 DIAGNOSIS — G43109 Migraine with aura, not intractable, without status migrainosus: Secondary | ICD-10-CM | POA: Diagnosis not present

## 2016-09-23 DIAGNOSIS — Z0000 Encounter for general adult medical examination without abnormal findings: Secondary | ICD-10-CM | POA: Diagnosis not present

## 2016-09-30 DIAGNOSIS — K5900 Constipation, unspecified: Secondary | ICD-10-CM | POA: Diagnosis not present

## 2016-09-30 DIAGNOSIS — K648 Other hemorrhoids: Secondary | ICD-10-CM | POA: Diagnosis not present

## 2016-09-30 DIAGNOSIS — Z01419 Encounter for gynecological examination (general) (routine) without abnormal findings: Secondary | ICD-10-CM | POA: Diagnosis not present

## 2016-10-01 DIAGNOSIS — G44009 Cluster headache syndrome, unspecified, not intractable: Secondary | ICD-10-CM | POA: Diagnosis not present

## 2016-10-01 DIAGNOSIS — G43109 Migraine with aura, not intractable, without status migrainosus: Secondary | ICD-10-CM | POA: Diagnosis not present

## 2016-10-08 DIAGNOSIS — H2513 Age-related nuclear cataract, bilateral: Secondary | ICD-10-CM | POA: Diagnosis not present

## 2016-10-08 DIAGNOSIS — L658 Other specified nonscarring hair loss: Secondary | ICD-10-CM | POA: Diagnosis not present

## 2017-01-10 DIAGNOSIS — K2900 Acute gastritis without bleeding: Secondary | ICD-10-CM | POA: Diagnosis not present

## 2017-01-10 DIAGNOSIS — F330 Major depressive disorder, recurrent, mild: Secondary | ICD-10-CM | POA: Diagnosis not present

## 2017-01-12 DIAGNOSIS — K2900 Acute gastritis without bleeding: Secondary | ICD-10-CM | POA: Diagnosis not present

## 2017-01-12 DIAGNOSIS — F330 Major depressive disorder, recurrent, mild: Secondary | ICD-10-CM | POA: Diagnosis not present

## 2017-02-02 DIAGNOSIS — F411 Generalized anxiety disorder: Secondary | ICD-10-CM | POA: Diagnosis not present

## 2017-02-02 DIAGNOSIS — L259 Unspecified contact dermatitis, unspecified cause: Secondary | ICD-10-CM | POA: Diagnosis not present

## 2017-02-02 DIAGNOSIS — L299 Pruritus, unspecified: Secondary | ICD-10-CM | POA: Diagnosis not present

## 2017-02-07 DIAGNOSIS — L239 Allergic contact dermatitis, unspecified cause: Secondary | ICD-10-CM | POA: Diagnosis not present

## 2017-02-07 DIAGNOSIS — L905 Scar conditions and fibrosis of skin: Secondary | ICD-10-CM | POA: Diagnosis not present

## 2017-04-13 DIAGNOSIS — K219 Gastro-esophageal reflux disease without esophagitis: Secondary | ICD-10-CM | POA: Diagnosis not present

## 2017-04-13 DIAGNOSIS — G43109 Migraine with aura, not intractable, without status migrainosus: Secondary | ICD-10-CM | POA: Diagnosis not present

## 2017-04-13 DIAGNOSIS — J3089 Other allergic rhinitis: Secondary | ICD-10-CM | POA: Diagnosis not present

## 2017-05-12 DIAGNOSIS — E539 Vitamin B deficiency, unspecified: Secondary | ICD-10-CM | POA: Diagnosis not present

## 2017-05-12 DIAGNOSIS — E559 Vitamin D deficiency, unspecified: Secondary | ICD-10-CM | POA: Diagnosis not present

## 2017-05-12 DIAGNOSIS — G43719 Chronic migraine without aura, intractable, without status migrainosus: Secondary | ICD-10-CM | POA: Diagnosis not present

## 2017-05-19 DIAGNOSIS — D2272 Melanocytic nevi of left lower limb, including hip: Secondary | ICD-10-CM | POA: Diagnosis not present

## 2017-05-19 DIAGNOSIS — D2271 Melanocytic nevi of right lower limb, including hip: Secondary | ICD-10-CM | POA: Diagnosis not present

## 2017-05-19 DIAGNOSIS — D2261 Melanocytic nevi of right upper limb, including shoulder: Secondary | ICD-10-CM | POA: Diagnosis not present

## 2017-05-19 DIAGNOSIS — L988 Other specified disorders of the skin and subcutaneous tissue: Secondary | ICD-10-CM | POA: Diagnosis not present

## 2017-06-10 DIAGNOSIS — L905 Scar conditions and fibrosis of skin: Secondary | ICD-10-CM | POA: Diagnosis not present

## 2017-06-10 DIAGNOSIS — L648 Other androgenic alopecia: Secondary | ICD-10-CM | POA: Diagnosis not present

## 2017-06-10 DIAGNOSIS — L649 Androgenic alopecia, unspecified: Secondary | ICD-10-CM | POA: Diagnosis not present

## 2017-06-23 DIAGNOSIS — E539 Vitamin B deficiency, unspecified: Secondary | ICD-10-CM | POA: Diagnosis not present

## 2017-06-23 DIAGNOSIS — E559 Vitamin D deficiency, unspecified: Secondary | ICD-10-CM | POA: Diagnosis not present

## 2017-06-23 DIAGNOSIS — G43719 Chronic migraine without aura, intractable, without status migrainosus: Secondary | ICD-10-CM | POA: Diagnosis not present

## 2018-09-07 ENCOUNTER — Telehealth: Payer: Self-pay | Admitting: Internal Medicine

## 2018-09-07 NOTE — Telephone Encounter (Signed)
Called to schedule Medicare Annual Wellness Visit with the Nurse Health Advisor. Last AWV-s 04/30/15.  Patient states she is now seeing PCP, Dr. Johny Blamer with [facility] in [place], Phone # [phone]. lec

## 2018-10-22 ENCOUNTER — Other Ambulatory Visit: Payer: Self-pay

## 2018-10-22 ENCOUNTER — Emergency Department
Admission: EM | Admit: 2018-10-22 | Discharge: 2018-10-23 | Disposition: A | Discharge status: Home / Self Care | Payer: Medicare HMO | Attending: Emergency Medicine | Admitting: Emergency Medicine

## 2018-10-22 ENCOUNTER — Emergency Department: Payer: Medicare HMO

## 2018-10-22 DIAGNOSIS — R51 Headache: Secondary | ICD-10-CM | POA: Diagnosis present

## 2018-10-22 DIAGNOSIS — R531 Weakness: Secondary | ICD-10-CM | POA: Diagnosis not present

## 2018-10-22 DIAGNOSIS — R519 Headache, unspecified: Secondary | ICD-10-CM

## 2018-10-22 LAB — URINALYSIS, COMPLETE (UACMP) WITH MICROSCOPIC
BACTERIA UA: NONE SEEN
BILIRUBIN URINE: NEGATIVE
Glucose, UA: NEGATIVE mg/dL
KETONES UR: 20 mg/dL — AB
LEUKOCYTE UA: NEGATIVE
NITRITE: NEGATIVE
PROTEIN: NEGATIVE mg/dL
SPECIFIC GRAVITY, URINE: 1.023 (ref 1.005–1.030)
pH: 6 (ref 5.0–8.0)

## 2018-10-22 LAB — COMPREHENSIVE METABOLIC PANEL
ALK PHOS: 64 U/L (ref 38–126)
ALT: 27 U/L (ref 0–44)
AST: 26 U/L (ref 15–41)
Albumin: 4 g/dL (ref 3.5–5.0)
Anion gap: 11 (ref 5–15)
BILIRUBIN TOTAL: 1.1 mg/dL (ref 0.3–1.2)
BUN: 20 mg/dL (ref 8–23)
CALCIUM: 9.2 mg/dL (ref 8.9–10.3)
CO2: 21 mmol/L — AB (ref 22–32)
CREATININE: 0.6 mg/dL (ref 0.44–1.00)
Chloride: 103 mmol/L (ref 98–111)
GFR calc non Af Amer: >60 mL/min (ref >60)
Glucose, Bld: 126 mg/dL — ABNORMAL HIGH (ref 70–99)
Potassium: 3.9 mmol/L (ref 3.5–5.1)
SODIUM: 135 mmol/L (ref 135–145)
Total Protein: 7.4 g/dL (ref 6.5–8.1)

## 2018-10-22 LAB — CBC WITH DIFFERENTIAL/PLATELET
Abs Immature Granulocytes: 0.03 10*3/uL (ref 0.00–0.07)
BASOS ABS: 0 10*3/uL (ref 0.0–0.1)
BASOS PCT: 0 %
EOS ABS: 0 10*3/uL (ref 0.0–0.5)
EOS PCT: 0 %
HEMATOCRIT: 44.6 % (ref 36.0–46.0)
HEMOGLOBIN: 15.4 g/dL — AB (ref 12.0–15.0)
Immature Granulocytes: 0 %
LYMPHS PCT: 15 %
Lymphs Abs: 1.4 10*3/uL (ref 0.7–4.0)
MCH: 30.7 pg (ref 26.0–34.0)
MCHC: 34.5 g/dL (ref 30.0–36.0)
MCV: 88.8 fL (ref 80.0–100.0)
MONO ABS: 0.3 10*3/uL (ref 0.1–1.0)
Monocytes Relative: 3 %
NRBC: 0 % (ref 0.0–0.2)
Neutro Abs: 7.8 10*3/uL — ABNORMAL HIGH (ref 1.7–7.7)
Neutrophils Relative %: 82 %
Platelets: 226 10*3/uL (ref 150–400)
RBC: 5.02 MIL/uL (ref 3.87–5.11)
RDW: 13.1 % (ref 11.5–15.5)
WBC: 9.5 10*3/uL (ref 4.0–10.5)

## 2018-10-22 LAB — ETHANOL: Alcohol, Ethyl (B): <10 mg/dL (ref <10)

## 2018-10-22 LAB — URINE DRUG SCREEN, QUALITATIVE (ARMC ONLY)
Amphetamines, Ur Screen: NOT DETECTED
BARBITURATES, UR SCREEN: NOT DETECTED
BENZODIAZEPINE, UR SCRN: NOT DETECTED
Cannabinoid 50 Ng, Ur ~~LOC~~: POSITIVE — AB
Cocaine Metabolite,Ur ~~LOC~~: NOT DETECTED
MDMA (Ecstasy)Ur Screen: NOT DETECTED
METHADONE SCREEN, URINE: NOT DETECTED
OPIATE, UR SCREEN: NOT DETECTED
PHENCYCLIDINE (PCP) UR S: NOT DETECTED
Tricyclic, Ur Screen: NOT DETECTED

## 2018-10-22 IMAGING — CT CT HEAD W/O CM
3 series · 16 of 47 positions shown, 19 images · non-contrast
Comparison: [DATE].

CLINICAL DATA: Migraine headache with nausea, vomiting and light
sensitivity.

EXAM:
CT HEAD WITHOUT CONTRAST
TECHNIQUE: Contiguous axial images were obtained from the base of the skull
through the vertex without intravenous contrast.

[Series 3: head wo · axial · 0.39mm/px · z∈[+157,+282]mm · 10 of 30 slices shown, 13 images]
[im 3/30  brain]
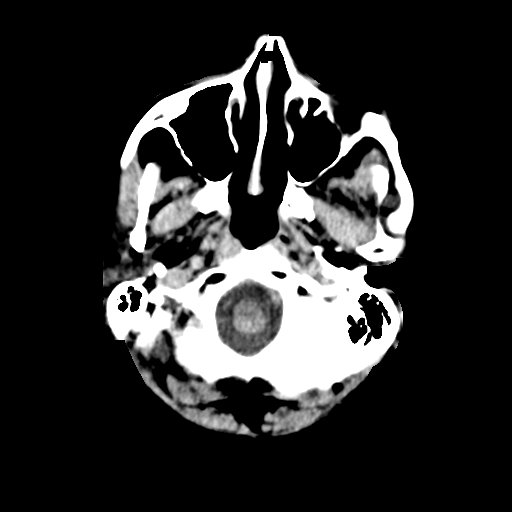
[im 3/30  bone]
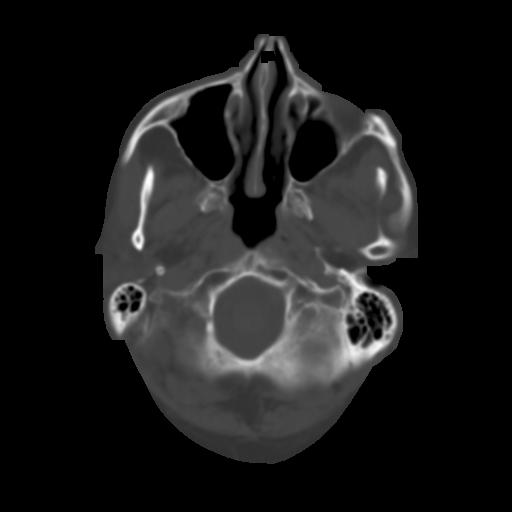
[im 6/30  brain]
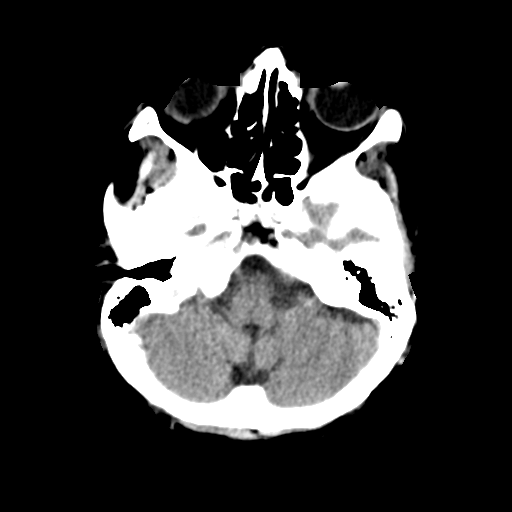
[im 9/30  brain]
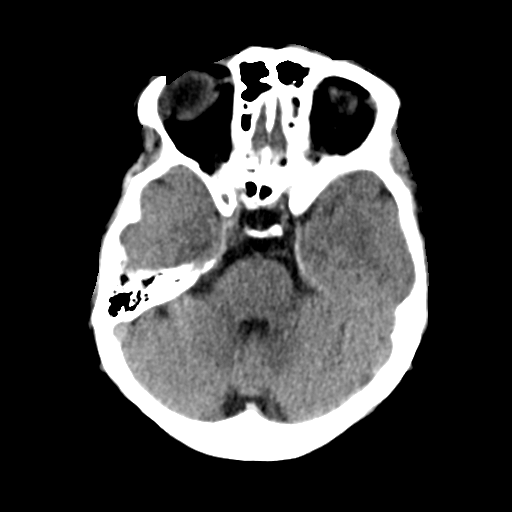
[im 11/30  brain]
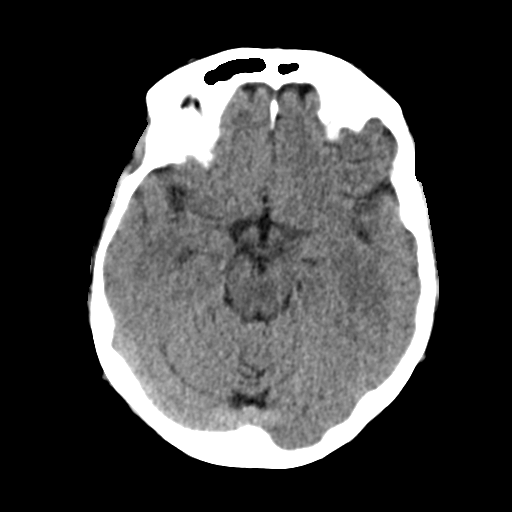
[im 14/30  brain]
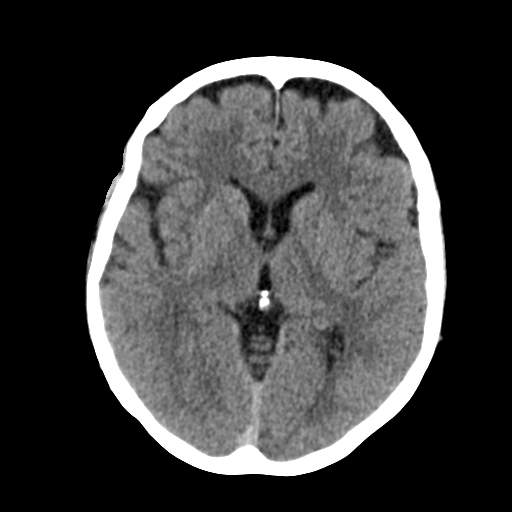
[im 14/30  bone]
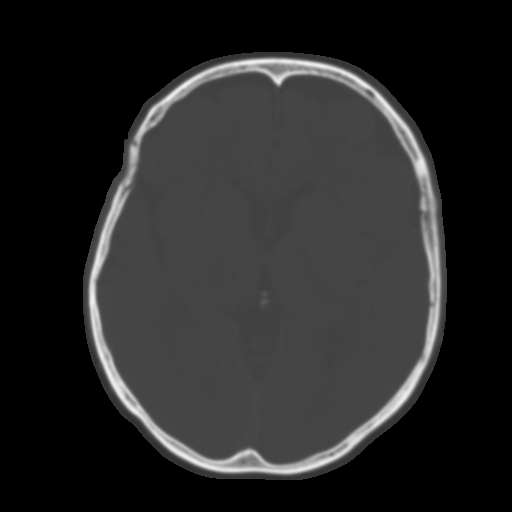
[im 17/30  brain]
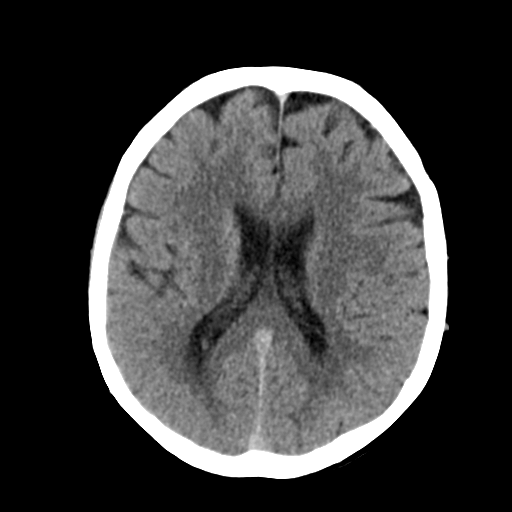
[im 20/30  brain]
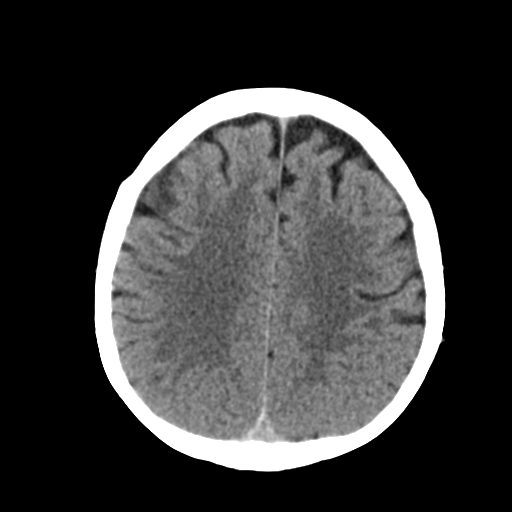
[im 23/30  brain]
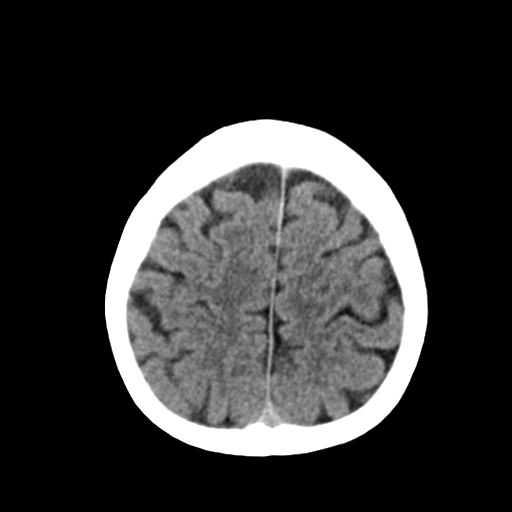
[im 25/30  brain]
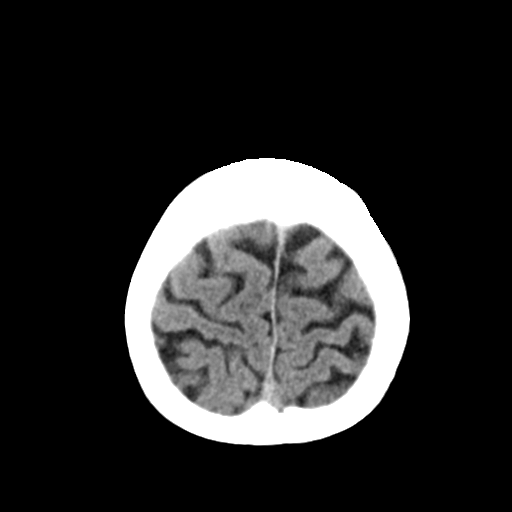
[im 25/30  bone]
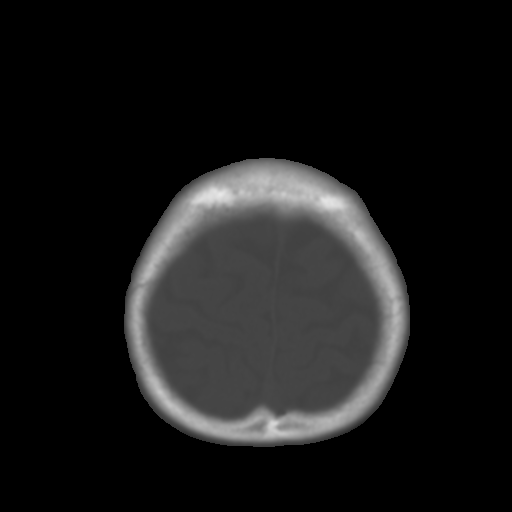
[im 28/30  brain]
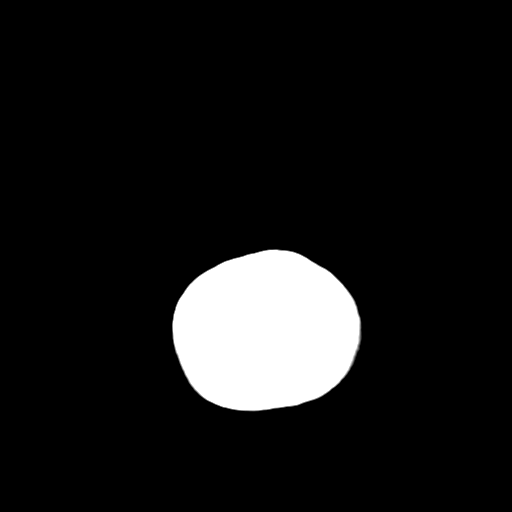

[Series 4: coronal soft tissue · coronal · 0.29mm/px · 3 of 59 slices shown]
[im 20/59  brain]
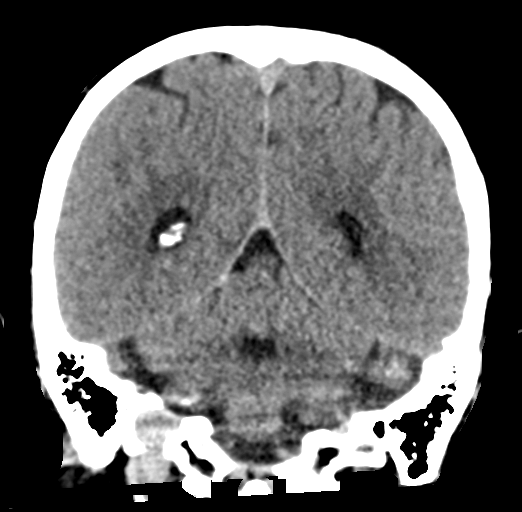
[im 26/59  brain]
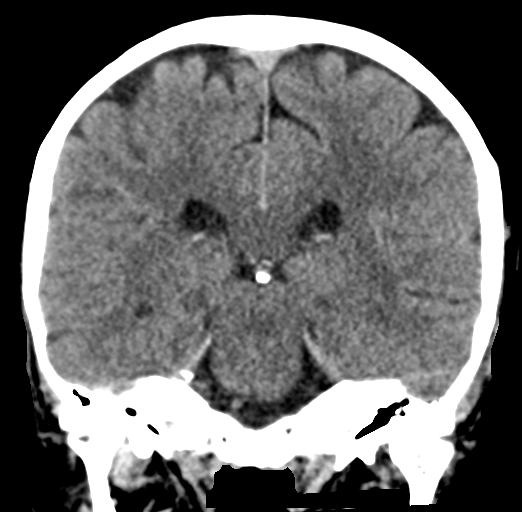
[im 33/59  brain]
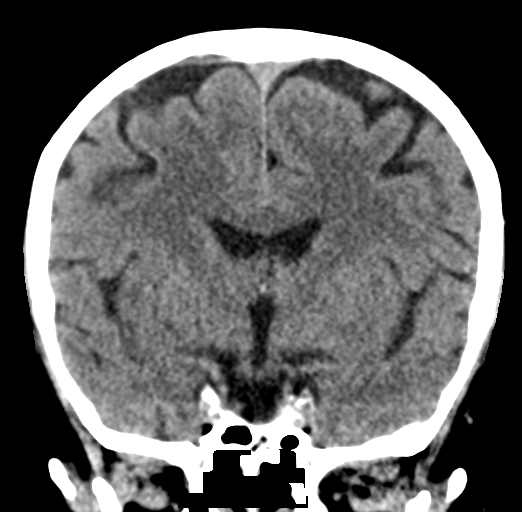

[Series 5: sagittal soft tissue · sagittal · 0.29mm/px · 3 of 51 slices shown]
[im 17/51  brain]
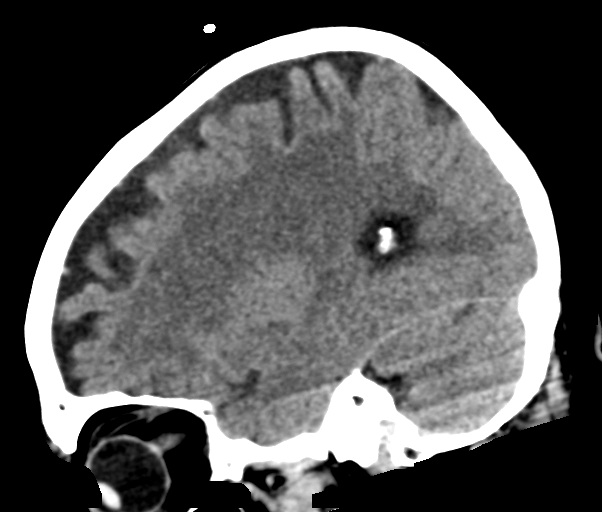
[im 26/51  brain]
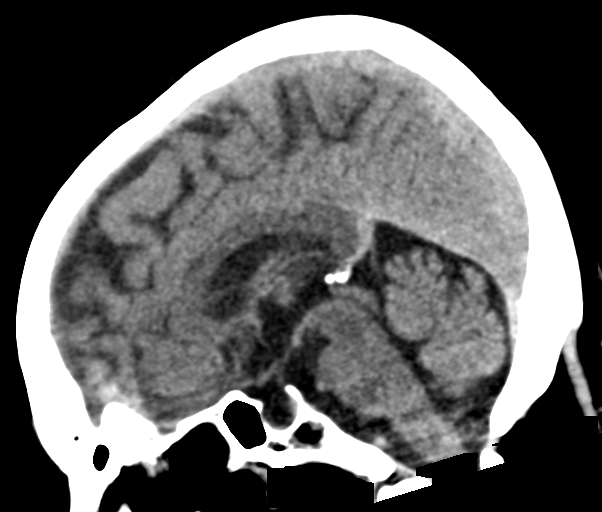
[im 34/51  brain]
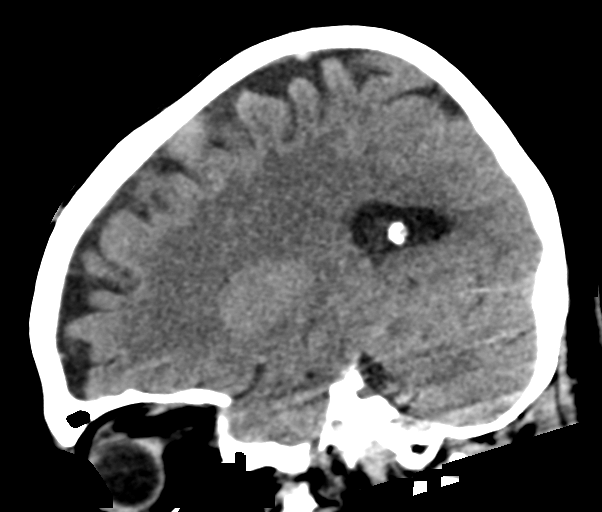

[16 of 47 positions shown; findings below may reference images not displayed]

FINDINGS: Brain: No evidence of acute infarction, hemorrhage, hydrocephalus,
extra-axial collection or mass lesion/mass effect.

Vascular: No hyperdense vessel or unexpected calcification.

Skull: Normal. Negative for fracture or focal lesion.

Sinuses/Orbits: No acute finding.

Other: None
IMPRESSION: 1. Normal exam.  No acute intracranial abnormalities.

## 2018-10-22 MED ORDER — METOCLOPRAMIDE HCL 5 MG/ML IJ SOLN
10.0000 mg | Freq: Once | INTRAMUSCULAR | Status: AC
  Administered 2018-10-22: 10 mg via INTRAVENOUS
  Filled 2018-10-22: qty 2

## 2018-10-22 MED ORDER — KETOROLAC TROMETHAMINE 30 MG/ML IJ SOLN
30.0000 mg | Freq: Once | INTRAMUSCULAR | Status: AC
  Administered 2018-10-22: 30 mg via INTRAVENOUS
  Filled 2018-10-22: qty 1

## 2018-10-22 MED ORDER — SODIUM CHLORIDE 0.9 % IV SOLN
Freq: Once | INTRAVENOUS | Status: AC
  Administered 2018-10-22: 17:00:00 via INTRAVENOUS

## 2018-10-22 NOTE — ED Notes (Addendum)
Family has left the bedside. Please call CRYSTAL CHURCH ([phone] when pt is ready for discharge.

## 2018-10-22 NOTE — ED Notes (Signed)
Pt ambulated to the bathroom with one assist. MD aware.

## 2018-10-22 NOTE — ED Notes (Signed)
Patient transported to CT 

## 2018-10-22 NOTE — ED Notes (Signed)
Report given to tele psych RN and consult will be called shortly, computer is in the room and plugged in and pt is aware of consult.
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
## 2018-10-22 NOTE — ED Triage Notes (Signed)
Pt BIB EMS from home for migraine since yesterday with nausea and vomiting. PT with hx of migraines. Light sensitivity reported.

## 2018-10-23 NOTE — Discharge Instructions (Addendum)
Return to the emergency department for any severe pain, fever, inability to keep down fluids, difficulty taking care of yourself, safety concerns, or any other symptoms concerning to you.

## 2018-10-23 NOTE — ED Notes (Signed)
PT at bedside.

## 2018-10-23 NOTE — ED Notes (Signed)
Family at bedside. Family advised RN Jinny Blossom would be in shortly and update with pt status and report.

## 2018-10-23 NOTE — ED Notes (Signed)
Walked pt to the toilet. Assisted back to bed, pt is talking with family.

## 2018-10-23 NOTE — ED Notes (Signed)
PAtient given baked potato

## 2018-10-23 NOTE — ED Provider Notes (Signed)
Patient was signed out to me by Dr. Chrys Racer a very niece, and had been initially seen by Dr. Lenise Arena.  She has been seen by [place], the social worker, and at this time, the patient would like to go home.  She is an independent person, able to take care of herself and continues to drive.  Physical therapy has seen her, and does not see any criteria that she meets for requirement of physical therapy at home.  I have spoken again with Lenise Arena, who is here today, who agrees with the plan that the patient is safe for discharge home.  She has been medically and psychiatrically cleared for discharge.   Eula Listen, MD 10/23/18 [phone]

## 2018-10-23 NOTE — ED Notes (Signed)
Walked pt to the toilet,and walked pt back to bed. With bed alarm on.

## 2018-10-23 NOTE — Clinical Social Work Note (Signed)
CSW consulted for facility placement. CSW found through chart review that patient was seen by PT today and they have recommended no PT follow up. Patient will not be able to go to SNF due to insurance. Since there is no PT recommendation for SNF the insurance company will not pay for facility placement. CSW has notified RNCM that patient will have to go home. CSW signing off. Please reconsult if further needs arise.    [place], [place]

## 2018-10-23 NOTE — Care Management Note (Signed)
Case Management Note  Patient Details  Name: Teresa Sherman MRN: [id number] Date of Birth: 1945/12/15  Subjective/Objective:     Patient was being seen in the ED for Migraine.  Patient reports that she has had migraines for years, since she was young.  Patient does take medications for migraines but this last one was so bad that her brother could not understand her on the phone yesterday and became concerned and called EMS.  Patient reports that she feels much better.  Patient is from home and lives alone.  Patient is independent and requires no assistive devices.  Patient denies wanting to go to SNF.  PT has evaluated and recommends no follow up.  Patient reports that she still drives.  She sees Dr. Joella Prince at [facility] for geriatric medicine and Dr. Manuella Ghazi with neurology.  Pharmacy is [facility] in [place].  Patient reports that she feels safe at home and has no concerns about being discharged back home.  Sister Ival [phone] will come and pick patient up.  RNCM received reports that the family had concerns about the patient staying by herself but the patient has been cleared by psychiatry and PT.   RNCM signed off. Doran Clay RN BSN Care Management [phone]               Action/Plan:   Expected Discharge Date:                  Expected Discharge Plan:  Home/Self Care  In-House Referral:  Clinical Social Work  Discharge planning Services  CM Consult  Post Acute Care Choice:    Choice offered to:     DME Arranged:    DME Agency:     HH Arranged:    [place] Agency:     Status of Service:  Completed, signed off  If discussed at [employer], dates discussed:    Additional Comments:  Shelbie Hutching, RN 10/23/2018, 4:31 PM
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
## 2018-10-23 NOTE — ED Notes (Signed)
Dietary contacted and ordered baked potato that patient requested

## 2019-02-15 ENCOUNTER — Encounter: Payer: Self-pay | Admitting: Emergency Medicine

## 2019-02-15 ENCOUNTER — Ambulatory Visit
Admission: EM | Admit: 2019-02-15 | Discharge: 2019-02-15 | Disposition: A | Discharge status: Home / Self Care | Payer: Medicare HMO

## 2019-02-15 ENCOUNTER — Other Ambulatory Visit: Payer: Self-pay

## 2019-02-15 ENCOUNTER — Ambulatory Visit (INDEPENDENT_AMBULATORY_CARE_PROVIDER_SITE_OTHER): Payer: Medicare HMO

## 2019-02-15 DIAGNOSIS — L089 Local infection of the skin and subcutaneous tissue, unspecified: Secondary | ICD-10-CM

## 2019-02-15 DIAGNOSIS — M25571 Pain in right ankle and joints of right foot: Secondary | ICD-10-CM | POA: Diagnosis not present

## 2019-02-15 DIAGNOSIS — M79671 Pain in right foot: Secondary | ICD-10-CM | POA: Diagnosis not present

## 2019-02-15 IMAGING — CR RIGHT FOOT COMPLETE - 3+ VIEW
3 series · 3 of 3 positions shown · non-contrast
Comparison: None.

CLINICAL DATA: Acute right foot pain and swelling.

EXAM:
RIGHT FOOT COMPLETE - 3+ VIEW

[foot ap]
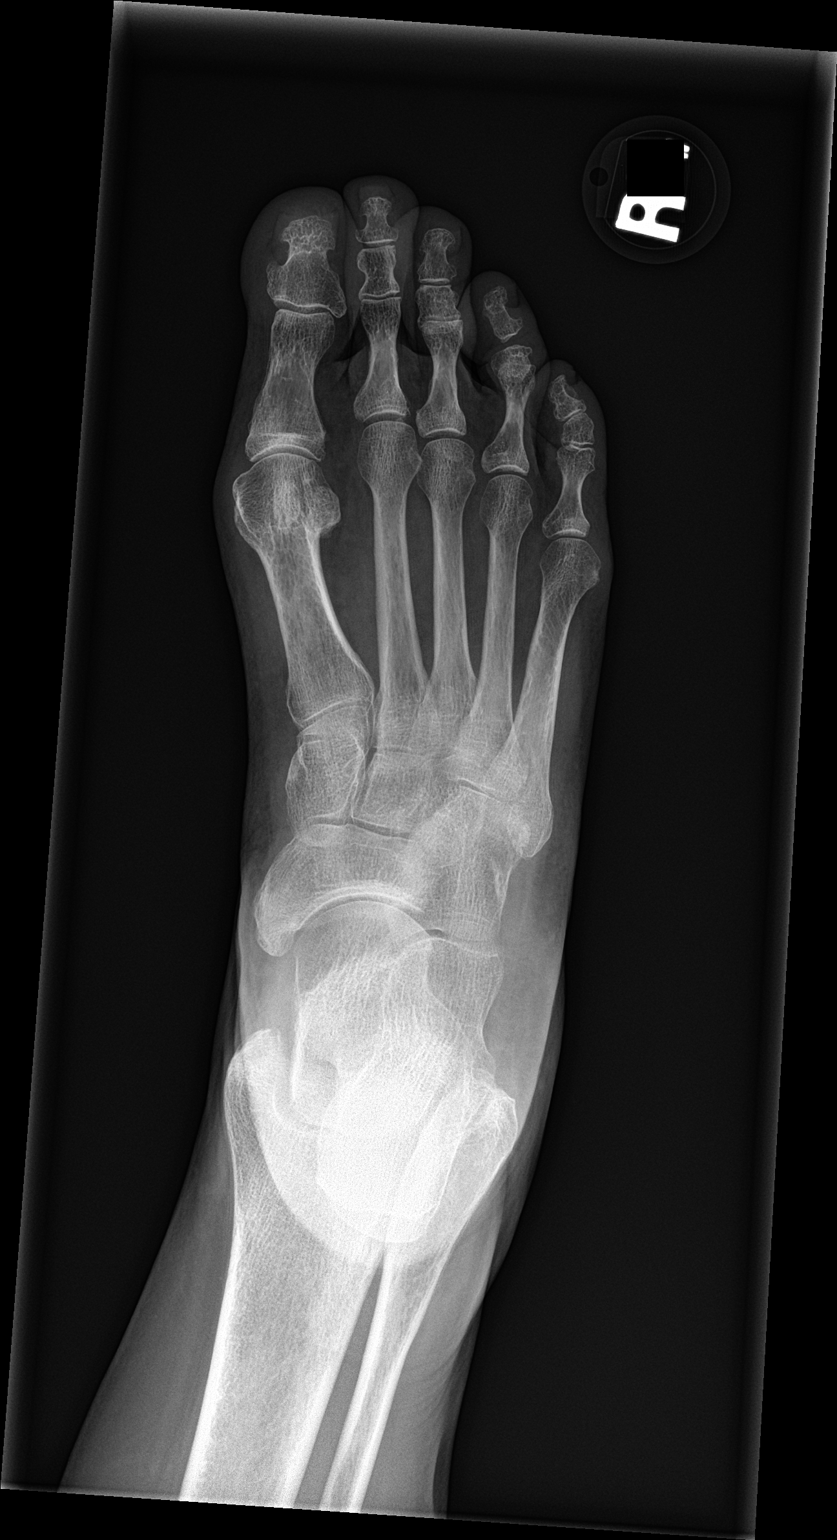

[foot obl]
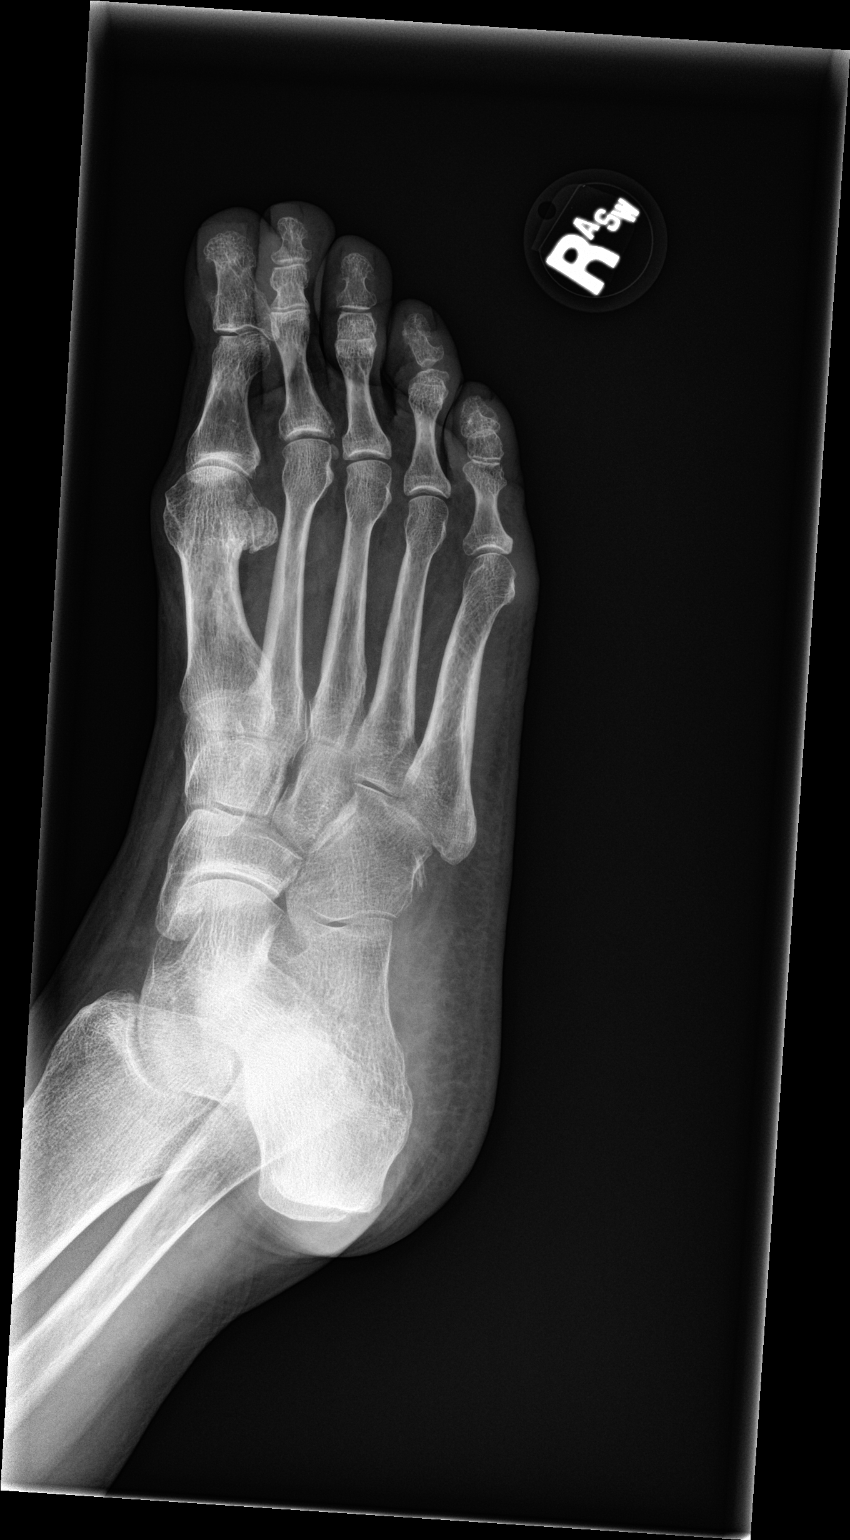

[foot lat]
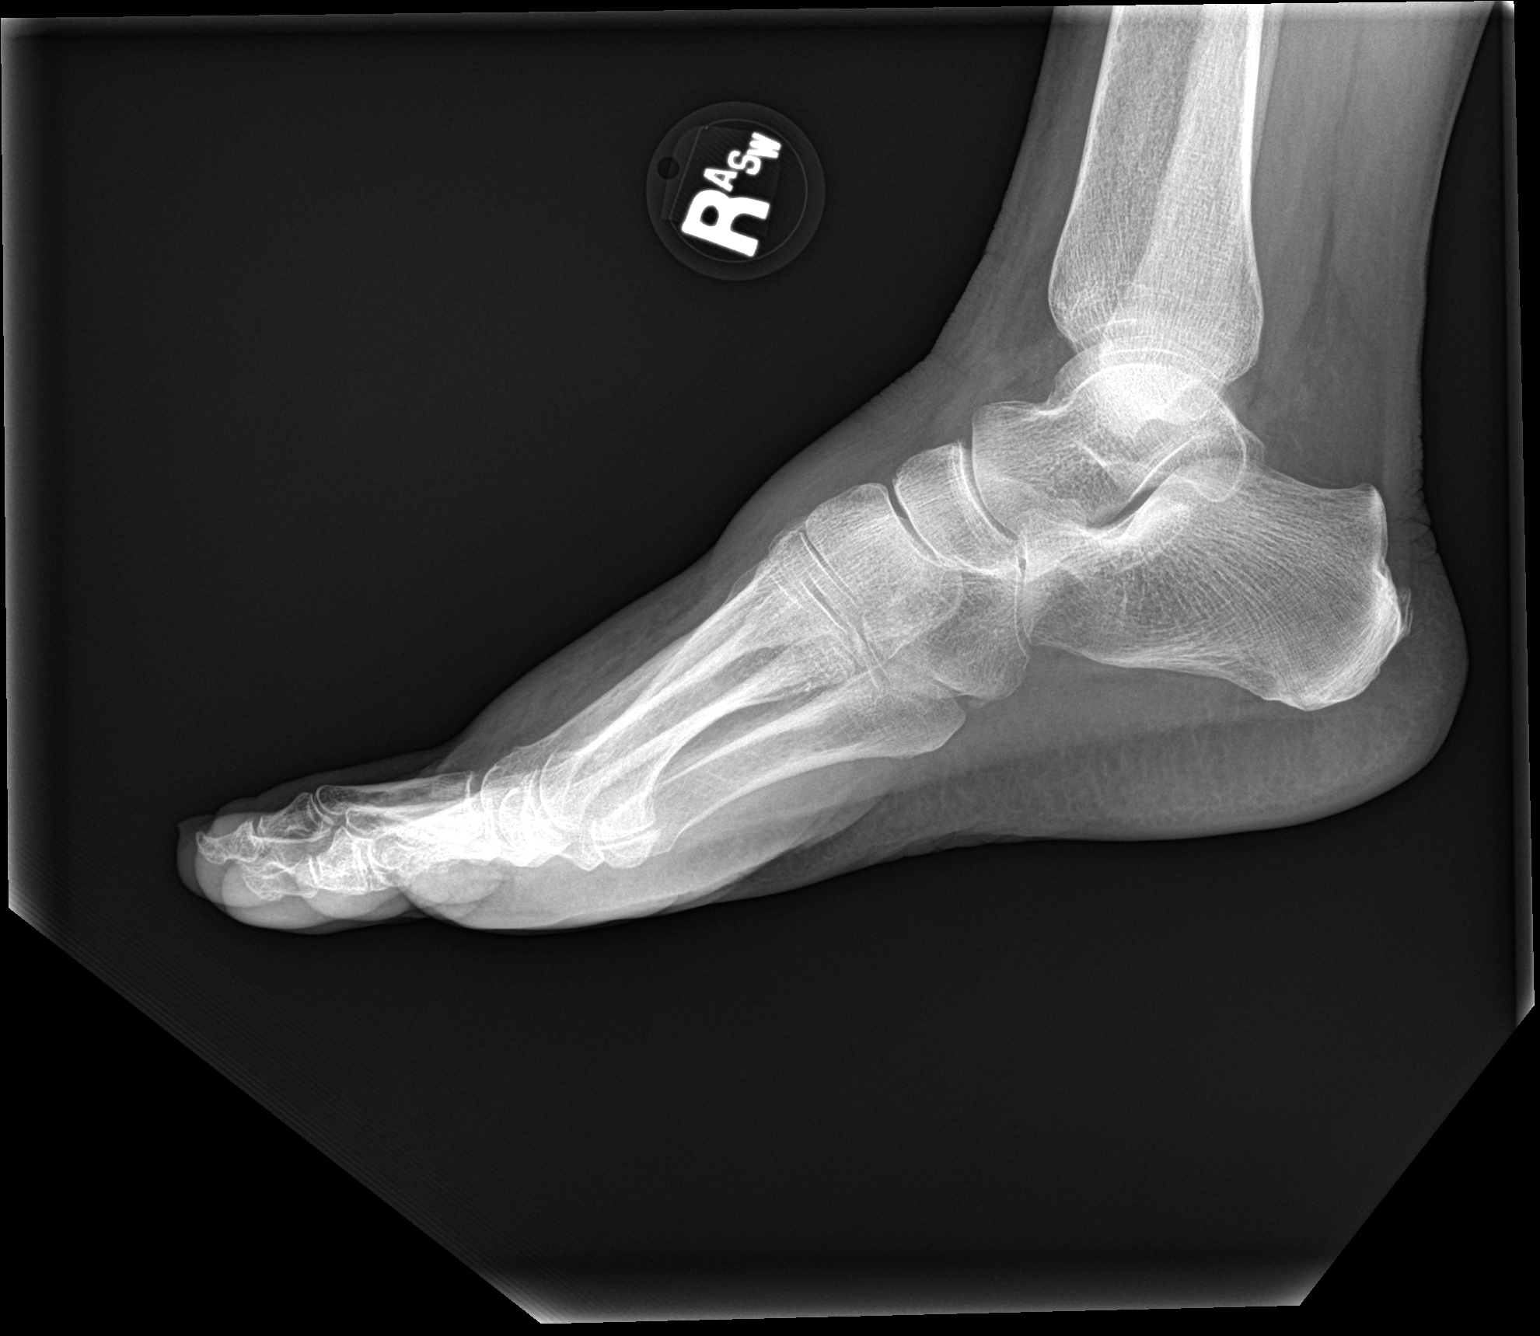

[3 of 3 positions shown; findings below may reference images not displayed]

FINDINGS: There is no evidence of fracture or dislocation. There is no
evidence of arthropathy or other focal bone abnormality. Dorsal soft
tissue swelling is noted concerning for infection or trauma.
IMPRESSION: Dorsal soft tissue swelling is noted concerning for infection or
traumatic injury. No fracture or other bony abnormality is noted.

## 2019-02-15 MED ORDER — CEPHALEXIN 500 MG PO CAPS: 500.0000 mg | ORAL_CAPSULE | Freq: Four times a day (QID) | ORAL | 0 refills | Status: DC

## 2019-02-15 NOTE — Discharge Instructions (Signed)
It was very nice seeing you today in clinic. Thank you for entrusting me with your care.   Please utilize the medications that we discussed. Your prescriptions have been called in to your pharmacy. Soak your foot in warm salt water at least daily.   Make arrangements to follow up with podiatry for re-evaluation. If your symptoms/condition worsens, please seek follow up care either here or in the ER. Please remember, our [place] providers are "right here with you" when you need [place].   Again, it was my pleasure to take care of you today. Thank you for choosing our clinic. I hope that you start to feel better quickly.   Honor Loh, MSN, APRN, FNP-C, CEN Advanced Practice Provider [facility]
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
## 2019-02-15 NOTE — ED Triage Notes (Signed)
Pt c/o pain and swelling in her right foot. She states that her foot has been swelling since February but about a month ago she felt a stinging sensation on her foot and thought something bite her and then a red lump came up.

## 2020-02-07 ENCOUNTER — Ambulatory Visit: Payer: Medicare HMO | Admitting: Dermatology

## 2021-01-20 ENCOUNTER — Ambulatory Visit
Admission: EM | Admit: 2021-01-20 | Discharge: 2021-01-20 | Disposition: A | Discharge status: Home / Self Care | Payer: Medicare HMO | Attending: Internal Medicine | Admitting: Internal Medicine

## 2021-01-20 ENCOUNTER — Other Ambulatory Visit: Payer: Self-pay

## 2021-01-20 DIAGNOSIS — L230 Allergic contact dermatitis due to metals: Secondary | ICD-10-CM

## 2021-01-20 MED ORDER — METHYLPREDNISOLONE 4 MG PO TBPK: ORAL_TABLET | ORAL | 0 refills | Status: DC

## 2021-01-20 NOTE — ED Triage Notes (Signed)
Pt presents with rash on hands after pulling weeds Friday
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
## 2021-01-20 NOTE — Discharge Instructions (Signed)
Wash your itching skin with baking soda with water to help the itching

## 2021-02-04 ENCOUNTER — Ambulatory Visit: Payer: Medicare HMO | Admitting: Dermatology

## 2021-06-08 ENCOUNTER — Other Ambulatory Visit: Payer: Self-pay

## 2021-06-08 ENCOUNTER — Ambulatory Visit: Payer: Medicare HMO | Admitting: Dermatology

## 2021-06-08 DIAGNOSIS — L659 Nonscarring hair loss, unspecified: Secondary | ICD-10-CM

## 2021-06-08 DIAGNOSIS — L658 Other specified nonscarring hair loss: Secondary | ICD-10-CM

## 2021-06-08 MED ORDER — MINOXIDIL 2.5 MG PO TABS: ORAL_TABLET | ORAL | 1 refills | Status: DC

## 2021-06-08 NOTE — Progress Notes (Addendum)
   New Patient Visit  Subjective  Teresa Sherman is a 75 y.o. female who presents for the following: Alopecia (Patient has noticed significant hair loss for the last 2 years. Patient currently using minoxidil shampoo. Patent advises she did start AIMOVIG injections once monthly about 2 years ago for migraines.).  The following portions of the chart were reviewed this encounter and updated as appropriate:   Tobacco  Allergies  Meds  Problems  Med Hx  Surg Hx  Fam Hx     Review of Systems:  No other skin or systemic complaints except as noted in HPI or Assessment and Plan.  Objective  Well appearing patient in no apparent distress; mood and affect are within normal limits.  A focused examination was performed including scalp. Relevant physical exam findings are noted in the Assessment and Plan.  Scalp            Assessment & Plan  Alopecia -telogen effluvium versus androgenetic alopecia versus combination of both. May be associated with stress from migraines versus treatment with Aimovig. Scalp Chronic and persistent  BP 134/84 Pulse 74 Weight 123 lbs  Start minoxidil half of a  2.5 mg (1.25 mg) once daily.   CBC and CMP reviewed from February, WNL. Will order thyroid.  No recent weight loss, no liver problems.  Patient advised to discontinue po Minoxidil and call our office if she develops swelling at ankles after starting minoxidil.   minoxidil (LONITEN) 2.5 MG tablet - Scalp Take 1/2 pill once daily.  Related Procedures Thyroid Panel With TSH  Return in about 3 months (around 09/08/2021) for Alopecia.  Graciella Belton, RMA, am acting as scribe for Sarina Ser, MD . Documentation: I have reviewed the above documentation for accuracy and completeness, and I agree with the above.  Sarina Ser, MD

## 2021-06-08 NOTE — Patient Instructions (Signed)

## 2021-06-09 ENCOUNTER — Encounter: Payer: Self-pay | Admitting: Dermatology

## 2021-06-09 LAB — THYROID PANEL WITH TSH
Free Thyroxine Index: 2.4 (ref 1.2–4.9)
T3 Uptake Ratio: 28 % (ref 24–39)
T4, Total: 8.7 ug/dL (ref 4.5–12.0)
TSH: 1.08 u[IU]/mL (ref 0.450–4.500)

## 2021-06-10 ENCOUNTER — Telehealth: Payer: Self-pay

## 2021-06-10 NOTE — Telephone Encounter (Signed)
-----   Message from Ralene Bathe, MD sent at 06/09/2021 12:05 PM EDT ----- Thyroid test normal for 06/08/21 Continue treatment plan as recommended. Keep follow up appt

## 2021-06-10 NOTE — Telephone Encounter (Signed)
Left message on voicemail to return my call.  

## 2021-06-12 NOTE — Addendum Note (Signed)
Addended by: Ralene Bathe on: 06/12/2021 06:17 PM   Modules accepted: Level of Service

## 2021-06-24 ENCOUNTER — Telehealth: Payer: Self-pay

## 2021-06-24 NOTE — Telephone Encounter (Signed)
Left message for patient regarding results - letter sent/hd

## 2021-06-24 NOTE — Telephone Encounter (Signed)
-----   Message from Ralene Bathe, MD sent at 06/09/2021 12:05 PM EDT ----- Thyroid test normal for 06/08/21 Continue treatment plan as recommended. Keep follow up appt

## 2021-09-09 ENCOUNTER — Ambulatory Visit: Payer: Medicare HMO | Admitting: Dermatology

## 2021-10-21 ENCOUNTER — Ambulatory Visit: Payer: Medicare HMO | Admitting: Dermatology

## 2021-11-29 ENCOUNTER — Other Ambulatory Visit: Payer: Self-pay

## 2021-11-29 ENCOUNTER — Inpatient Hospital Stay
Admission: EM | Admit: 2021-11-29 | Discharge: 2021-12-08 | DRG: 091 | Disposition: A | Discharge status: Home Health | Payer: Medicare HMO | Attending: Internal Medicine | Admitting: Internal Medicine

## 2021-11-29 ENCOUNTER — Emergency Department: Payer: Medicare HMO

## 2021-11-29 ENCOUNTER — Observation Stay: Payer: Medicare HMO

## 2021-11-29 ENCOUNTER — Encounter: Payer: Self-pay | Admitting: Radiology

## 2021-11-29 DIAGNOSIS — G928 Other toxic encephalopathy: Principal | ICD-10-CM | POA: Diagnosis present

## 2021-11-29 DIAGNOSIS — E876 Hypokalemia: Secondary | ICD-10-CM | POA: Diagnosis present

## 2021-11-29 DIAGNOSIS — Z8673 Personal history of transient ischemic attack (TIA), and cerebral infarction without residual deficits: Secondary | ICD-10-CM | POA: Diagnosis not present

## 2021-11-29 DIAGNOSIS — E785 Hyperlipidemia, unspecified: Secondary | ICD-10-CM | POA: Diagnosis present

## 2021-11-29 DIAGNOSIS — Z981 Arthrodesis status: Secondary | ICD-10-CM

## 2021-11-29 DIAGNOSIS — G479 Sleep disorder, unspecified: Secondary | ICD-10-CM

## 2021-11-29 DIAGNOSIS — Z66 Do not resuscitate: Secondary | ICD-10-CM | POA: Diagnosis present

## 2021-11-29 DIAGNOSIS — Z79891 Long term (current) use of opiate analgesic: Secondary | ICD-10-CM

## 2021-11-29 DIAGNOSIS — R4701 Aphasia: Secondary | ICD-10-CM | POA: Diagnosis present

## 2021-11-29 DIAGNOSIS — Z885 Allergy status to narcotic agent status: Secondary | ICD-10-CM

## 2021-11-29 DIAGNOSIS — G3184 Mild cognitive impairment, so stated: Secondary | ICD-10-CM | POA: Diagnosis present

## 2021-11-29 DIAGNOSIS — Z20822 Contact with and (suspected) exposure to covid-19: Secondary | ICD-10-CM | POA: Diagnosis present

## 2021-11-29 DIAGNOSIS — K219 Gastro-esophageal reflux disease without esophagitis: Secondary | ICD-10-CM | POA: Diagnosis present

## 2021-11-29 DIAGNOSIS — R4182 Altered mental status, unspecified: Secondary | ICD-10-CM | POA: Diagnosis present

## 2021-11-29 DIAGNOSIS — E871 Hypo-osmolality and hyponatremia: Secondary | ICD-10-CM | POA: Diagnosis present

## 2021-11-29 DIAGNOSIS — Z888 Allergy status to other drugs, medicaments and biological substances status: Secondary | ICD-10-CM

## 2021-11-29 DIAGNOSIS — Z79899 Other long term (current) drug therapy: Secondary | ICD-10-CM

## 2021-11-29 DIAGNOSIS — F419 Anxiety disorder, unspecified: Secondary | ICD-10-CM | POA: Diagnosis present

## 2021-11-29 DIAGNOSIS — R443 Hallucinations, unspecified: Secondary | ICD-10-CM | POA: Diagnosis not present

## 2021-11-29 DIAGNOSIS — G4700 Insomnia, unspecified: Secondary | ICD-10-CM | POA: Diagnosis present

## 2021-11-29 DIAGNOSIS — F32A Depression, unspecified: Secondary | ICD-10-CM | POA: Diagnosis present

## 2021-11-29 DIAGNOSIS — F418 Other specified anxiety disorders: Secondary | ICD-10-CM | POA: Diagnosis present

## 2021-11-29 DIAGNOSIS — Z7982 Long term (current) use of aspirin: Secondary | ICD-10-CM

## 2021-11-29 DIAGNOSIS — R471 Dysarthria and anarthria: Secondary | ICD-10-CM | POA: Diagnosis present

## 2021-11-29 DIAGNOSIS — Z881 Allergy status to other antibiotic agents status: Secondary | ICD-10-CM

## 2021-11-29 DIAGNOSIS — J690 Pneumonitis due to inhalation of food and vomit: Secondary | ICD-10-CM | POA: Diagnosis not present

## 2021-11-29 LAB — CBC WITH DIFFERENTIAL/PLATELET
Abs Immature Granulocytes: 0.02 10*3/uL (ref 0.00–0.07)
Basophils Absolute: 0 10*3/uL (ref 0.0–0.1)
Basophils Relative: 0 %
Eosinophils Absolute: 0 10*3/uL (ref 0.0–0.5)
Eosinophils Relative: 0 %
HCT: 42.4 % (ref 36.0–46.0)
Hemoglobin: 14.3 g/dL (ref 12.0–15.0)
Immature Granulocytes: 0 %
Lymphocytes Relative: 27 %
Lymphs Abs: 2.4 10*3/uL (ref 0.7–4.0)
MCH: 29.6 pg (ref 26.0–34.0)
MCHC: 33.7 g/dL (ref 30.0–36.0)
MCV: 87.8 fL (ref 80.0–100.0)
Monocytes Absolute: 0.9 10*3/uL (ref 0.1–1.0)
Monocytes Relative: 10 %
Neutro Abs: 5.7 10*3/uL (ref 1.7–7.7)
Neutrophils Relative %: 63 %
Platelets: 215 10*3/uL (ref 150–400)
RBC: 4.83 MIL/uL (ref 3.87–5.11)
RDW: 14.6 % (ref 11.5–15.5)
WBC: 9 10*3/uL (ref 4.0–10.5)
nRBC: 0 % (ref 0.0–0.2)

## 2021-11-29 LAB — URINALYSIS, ROUTINE W REFLEX MICROSCOPIC
Bacteria, UA: NONE SEEN
Bilirubin Urine: NEGATIVE
Glucose, UA: NEGATIVE mg/dL
Ketones, ur: 20 mg/dL — AB
Leukocytes,Ua: NEGATIVE
Nitrite: NEGATIVE
Protein, ur: 30 mg/dL — AB
Specific Gravity, Urine: 1.028 (ref 1.005–1.030)
Squamous Epithelial / HPF: NONE SEEN (ref 0–5)
pH: 7 (ref 5.0–8.0)

## 2021-11-29 LAB — TSH: TSH: 1.475 u[IU]/mL (ref 0.350–4.500)

## 2021-11-29 LAB — URINE DRUG SCREEN, QUALITATIVE (ARMC ONLY)
Amphetamines, Ur Screen: NOT DETECTED
Barbiturates, Ur Screen: NOT DETECTED
Benzodiazepine, Ur Scrn: NOT DETECTED
Cannabinoid 50 Ng, Ur ~~LOC~~: NOT DETECTED
Cocaine Metabolite,Ur ~~LOC~~: NOT DETECTED
MDMA (Ecstasy)Ur Screen: NOT DETECTED
Methadone Scn, Ur: NOT DETECTED
Opiate, Ur Screen: NOT DETECTED
Phencyclidine (PCP) Ur S: NOT DETECTED
Tricyclic, Ur Screen: NOT DETECTED

## 2021-11-29 LAB — COMPREHENSIVE METABOLIC PANEL
ALT: 28 U/L (ref 0–44)
AST: 27 U/L (ref 15–41)
Albumin: 3.8 g/dL (ref 3.5–5.0)
Alkaline Phosphatase: 121 U/L (ref 38–126)
Anion gap: 10 (ref 5–15)
BUN: 20 mg/dL (ref 8–23)
CO2: 24 mmol/L (ref 22–32)
Calcium: 9 mg/dL (ref 8.9–10.3)
Chloride: 98 mmol/L (ref 98–111)
Creatinine, Ser: 0.7 mg/dL (ref 0.44–1.00)
GFR, Estimated: >60 mL/min (ref >60)
Glucose, Bld: 105 mg/dL — ABNORMAL HIGH (ref 70–99)
Potassium: 4.1 mmol/L (ref 3.5–5.1)
Sodium: 132 mmol/L — ABNORMAL LOW (ref 135–145)
Total Bilirubin: 1.6 mg/dL — ABNORMAL HIGH (ref 0.3–1.2)
Total Protein: 7.8 g/dL (ref 6.5–8.1)

## 2021-11-29 LAB — AMMONIA: Ammonia: 19 umol/L (ref 9–35)

## 2021-11-29 LAB — RESP PANEL BY RT-PCR (FLU A&B, COVID) ARPGX2
Influenza A by PCR: NEGATIVE
Influenza B by PCR: NEGATIVE
SARS Coronavirus 2 by RT PCR: NEGATIVE

## 2021-11-29 LAB — PROCALCITONIN: Procalcitonin: <0.1 ng/mL

## 2021-11-29 LAB — PROTIME-INR
INR: 1.1 (ref 0.8–1.2)
Prothrombin Time: 14.3 seconds (ref 11.4–15.2)

## 2021-11-29 LAB — APTT: aPTT: 28 seconds (ref 24–36)

## 2021-11-29 IMAGING — CT CT ANGIO HEAD-NECK (W OR W/O PERF)
1 of 11 series · 5 of 33 positions shown · IV contrast (APPLIED)
Comparison: Head CT [DATE]

CLINICAL DATA: Neuro deficit, acute, stroke suspected. Abnormal
behavior.

EXAM:
CT ANGIOGRAPHY HEAD AND NECK
TECHNIQUE: Multidetector CT imaging of the head and neck was performed using
the standard protocol during bolus administration of intravenous
contrast. Multiplanar CT image reconstructions and MIPs were
obtained to evaluate the vascular anatomy. Carotid stenosis
measurements (when applicable) are obtained utilizing NASCET
criteria, using the distal internal carotid diameter as the
denominator.

[Series 11: ax thins · axial · 0.39mm/px · z∈[-226,-16]mm · 5 of 316 slices shown]
[im 53/316  soft-tissue]
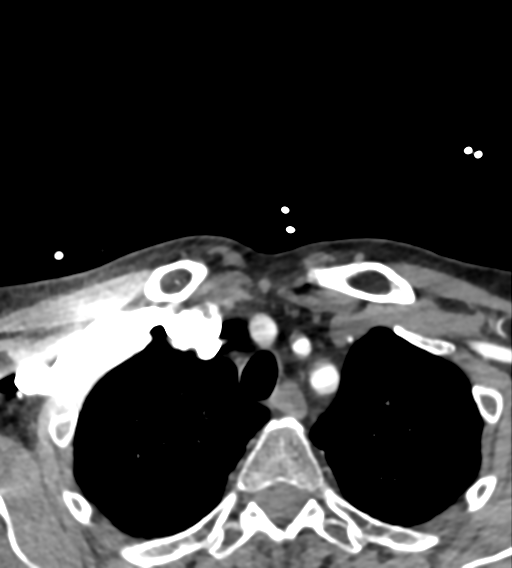
[im 106/316  bone]
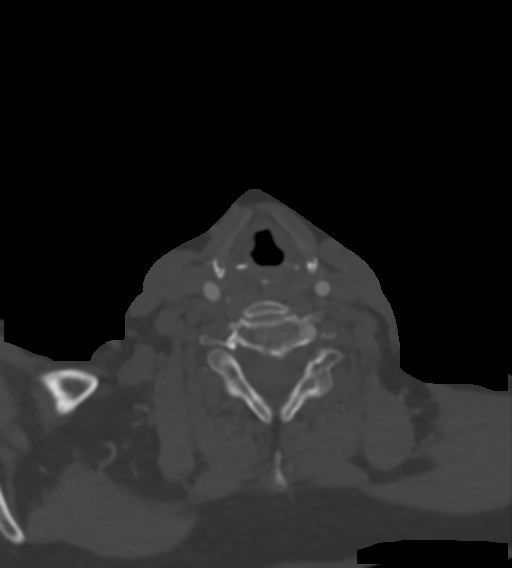
[im 158/316  soft-tissue]
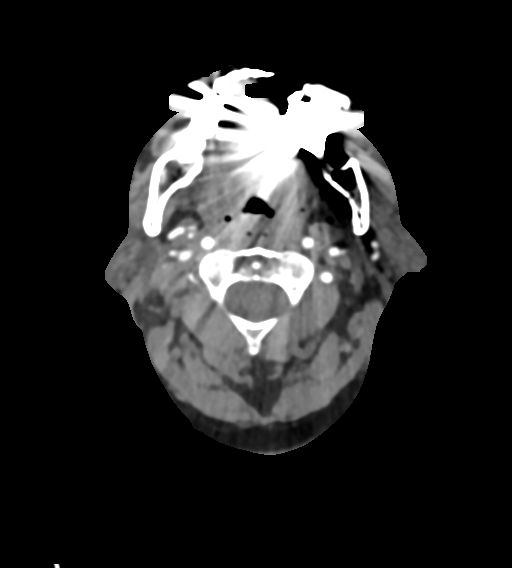
[im 211/316  bone]
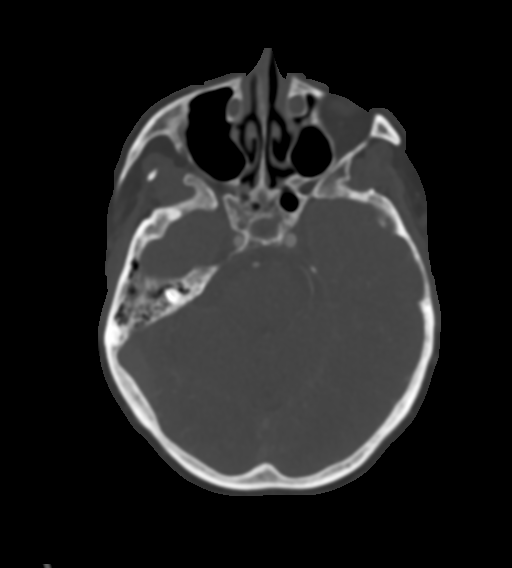
[im 263/316  soft-tissue]
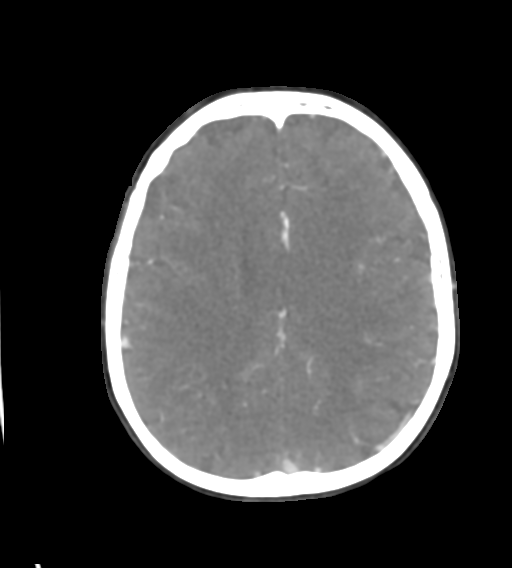

[5 of 33 positions shown; findings below may reference images not displayed]

RADIATION DOSE REDUCTION: This exam was performed according to the
departmental dose-optimization program which includes automated
exposure control, adjustment of the mA and/or kV according to
patient size and/or use of iterative reconstruction technique.

CONTRAST:  75mL OMNIPAQUE IOHEXOL 350 MG/ML SOLN
FINDINGS: CT HEAD FINDINGS

Brain: Mild age related volume loss. No evidence of old or acute
focal infarction, mass lesion, hemorrhage, hydrocephalus or
extra-axial collection.

Vascular: No abnormal vascular finding.

Skull: Normal

Sinuses: Clear

Orbits: Normal

Review of the MIP images confirms the above findings

CTA NECK FINDINGS

Aortic arch: Aortic arch is normal.

Right carotid system: Common carotid artery is normal. Minimal
calcified plaque at the bifurcation but no stenosis. Cervical ICA is
normal.

Left carotid system: Common carotid artery widely patent to the
bifurcation. The bifurcation is normal. Cervical ICA is normal.

Vertebral arteries: Both vertebral arteries are patent at their
origins and through the cervical region. The left vertebral artery
is strongly dominant.

Skeleton: Minimal spondylosis.

Other neck: No mass or lymphadenopathy.

Upper chest: Negative

Review of the MIP images confirms the above findings

CTA HEAD FINDINGS

Anterior circulation: Both internal carotid arteries are patent
through the skull base and siphon regions. No siphon stenosis. The
anterior and middle cerebral vessels are normal. No large vessel
occlusion or proximal stenosis. No aneurysm or vascular
malformation.

Posterior circulation: Both vertebral arteries are patent through
the foramen magnum to the basilar. No basilar stenosis. Posterior
circulation branch vessels are normal.

Venous sinuses: Normal

Anatomic variants: None

Review of the MIP images confirms the above findings
IMPRESSION: Normal head CT for age.

Normal CT angiography of the head and neck.

## 2021-11-29 IMAGING — MR MR THORACIC SPINE WO/W CM
5 of 8 series · 27 of 48 positions shown · IV contrast (gadavist)
Comparison: None.

CLINICAL DATA: Altered mental status

EXAM:
MRI THORACIC AND LUMBAR SPINE WITHOUT AND WITH CONTRAST
TECHNIQUE: Multiplanar and multiecho pulse sequences of the thoracic and lumbar
spine were obtained without and with intravenous contrast.
CONTRAST:  6mL GADAVIST GADOBUTROL 1 MMOL/ML IV SOLN

[Series 19: T2 · sagittal · 3.0mm · 1.06mm/px · 3 of 16 slices shown (1 of 2)]
[im 1/16]
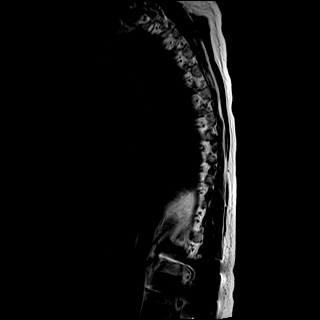
[im 8/16]
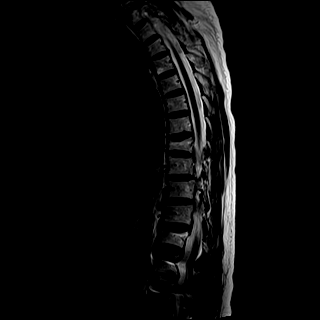
[im 16/16]
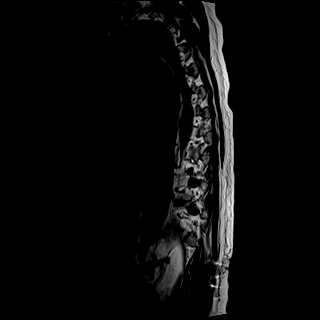

[Series 20: T1 · sagittal · 3.0mm · 1.06mm/px · 3 of 16 slices shown (1 of 2)]
[im 1/16]
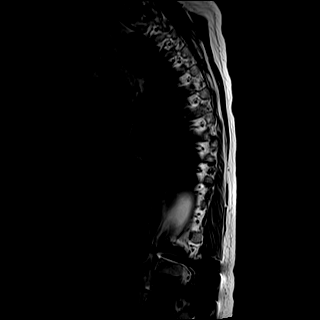
[im 8/16]
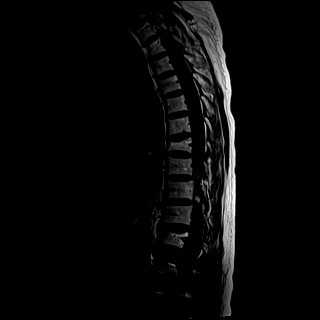
[im 16/16]
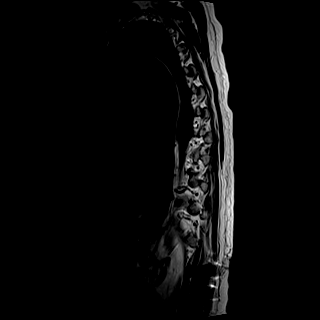

[Series 22: T2 · axial · 4.0mm · 0.59mm/px · z∈[-215,-16]mm · 9 of 39 slices shown (2 of 2)]
[im 1/39]
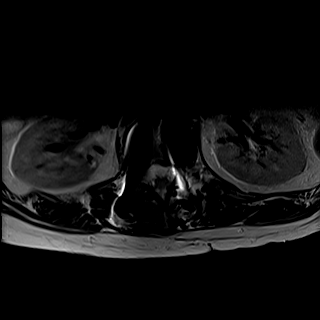
[im 5/39]
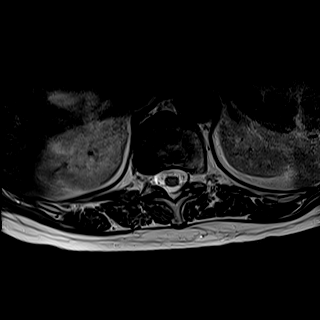
[im 10/39]
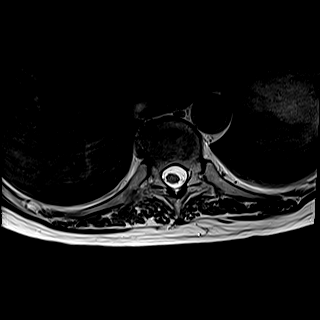
[im 15/39]
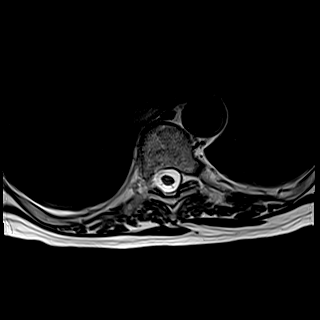
[im 20/39]
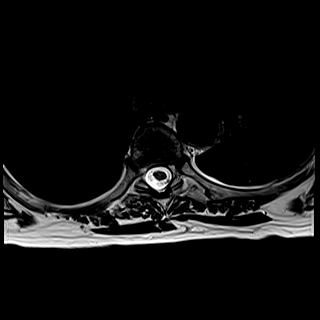
[im 24/39]
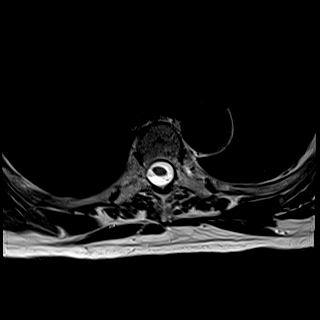
[im 29/39]
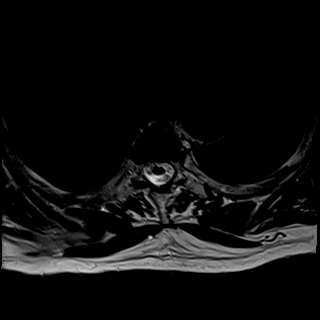
[im 34/39]
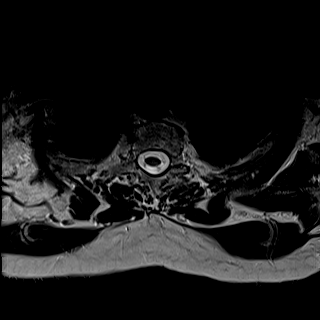
[im 39/39]
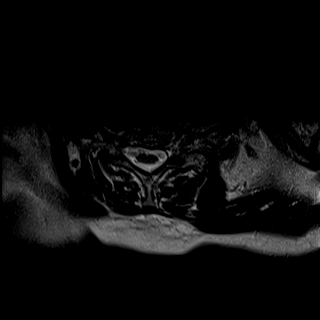

[Series 23: T1 · axial · non-contrast · 4.0mm · 0.31mm/px · z∈[-215,-16]mm · 9 of 39 slices shown (2 of 2)]
[im 1/39]
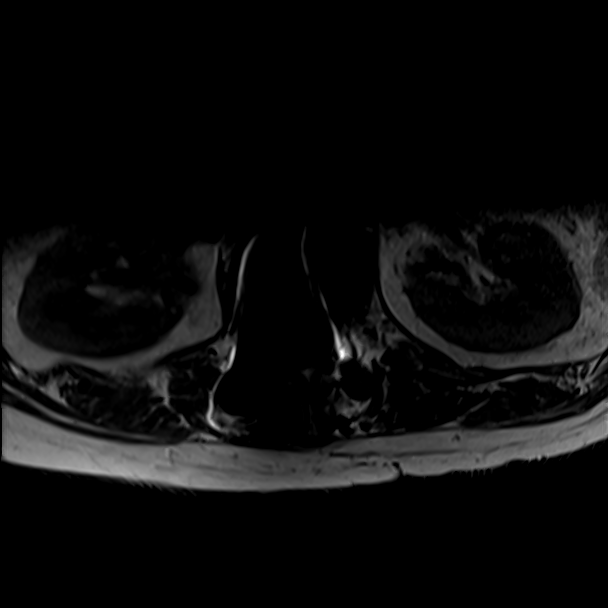
[im 5/39]
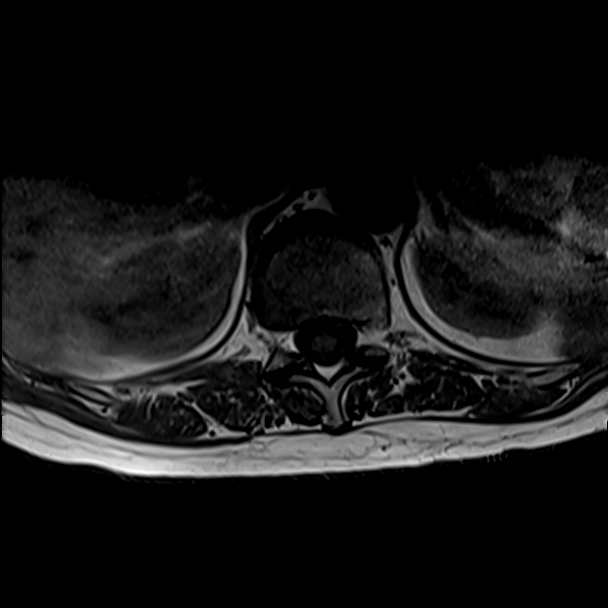
[im 10/39]
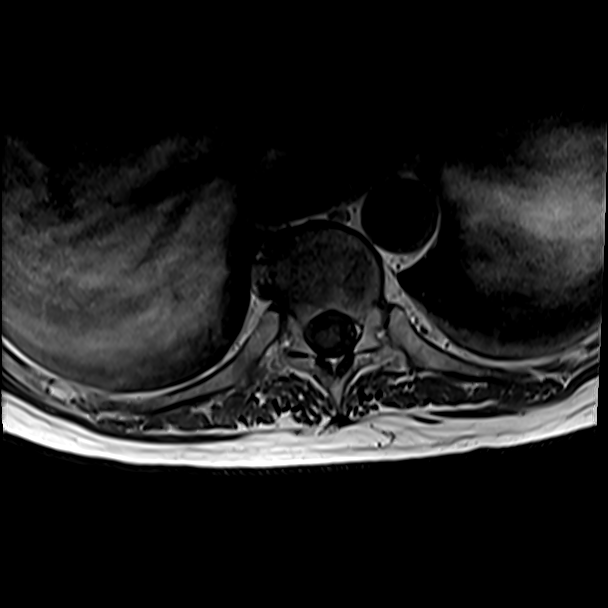
[im 15/39]
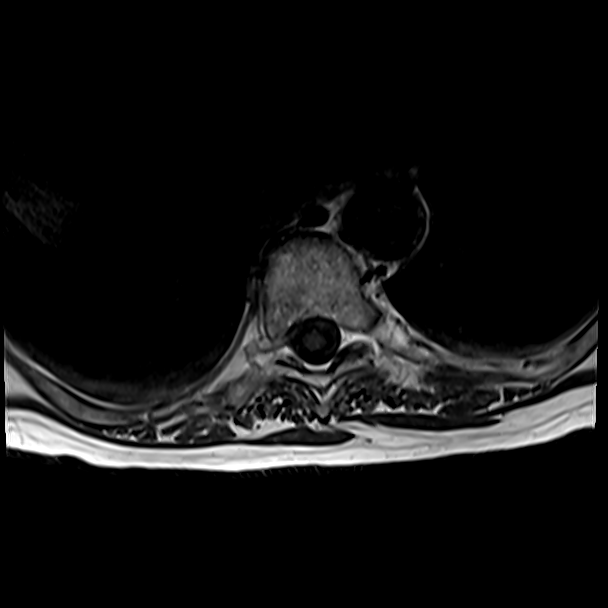
[im 20/39]
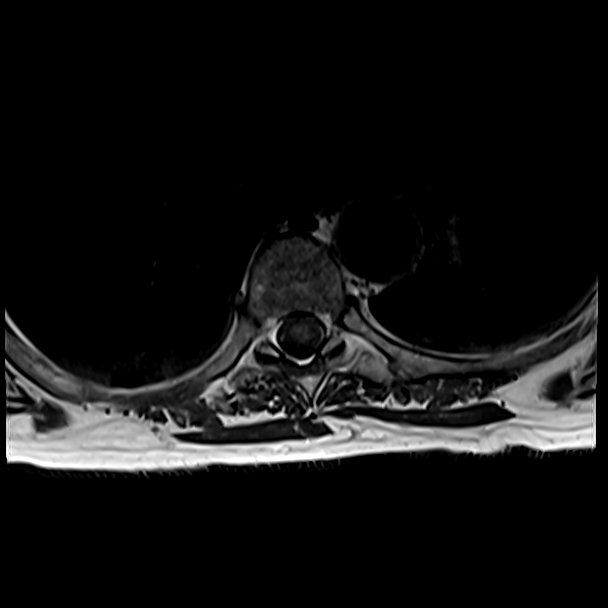
[im 24/39]
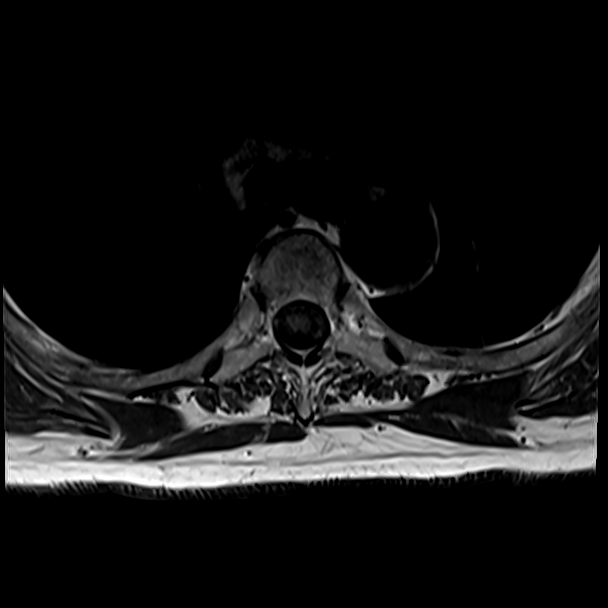
[im 29/39]
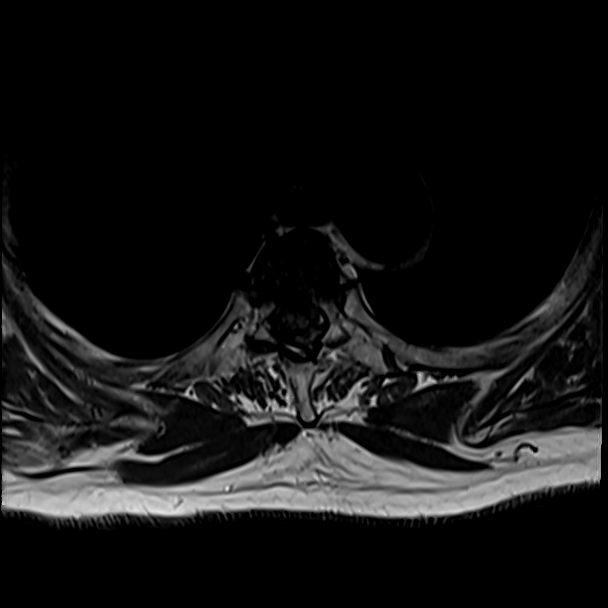
[im 34/39]
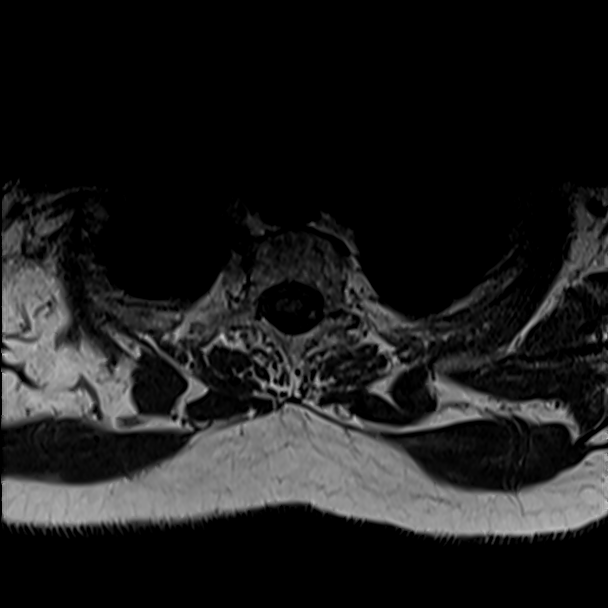
[im 39/39]
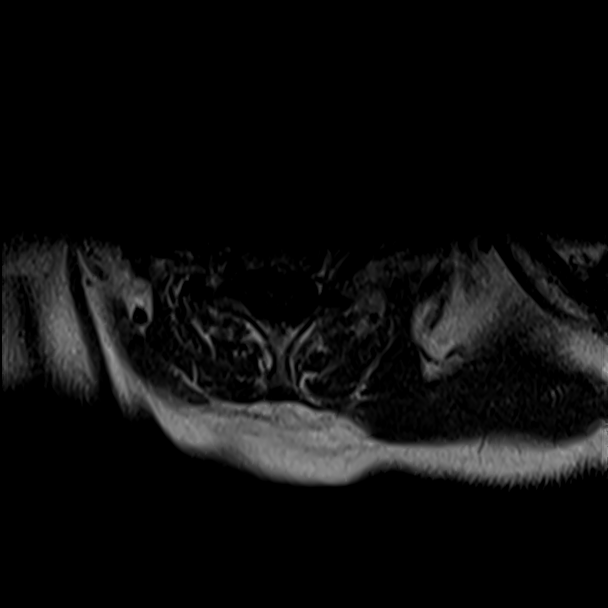

[Series 26: T1 fat-sat post-contrast · sagittal · 3.0mm · 1.06mm/px · 3 of 16 slices shown]
[im 1/16]
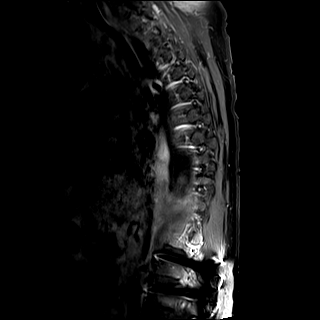
[im 8/16]
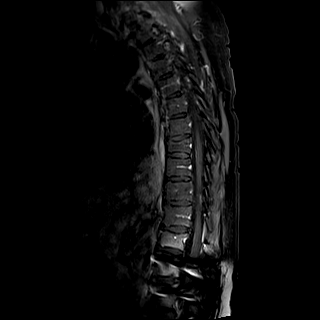
[im 16/16]
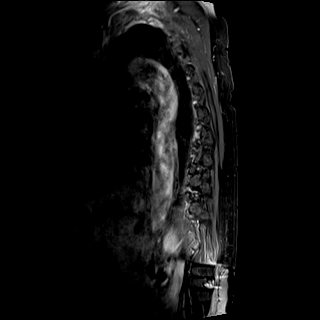

[27 of 48 positions shown; findings below may reference images not displayed]

FINDINGS: MRI THORACIC SPINE FINDINGS

Alignment:  Physiologic.

Vertebrae: Mild height loss of T8 without bone marrow edema

Cord:  Normal signal and morphology.

Paraspinal and other soft tissues: Negative.

Disc levels:

No spinal canal stenosis.

MRI LUMBAR SPINE FINDINGS

Segmentation:  Standard.

Alignment:  Physiologic.

Vertebrae: Chronic compression deformity of L3. There is posterior
instrumented fusion from L1-L5 spanning the L3 level.

Conus medullaris: Extends to the L1 level and appears normal.

Paraspinal and other soft tissues: Negative.

Disc levels:

There is no spinal canal stenosis or neural impingement.
IMPRESSION: 1. No acute abnormality of the thoracic or lumbar spine.
2. Mild chronic height loss of T8 with no bone marrow edema.
3. Chronic L3 compression fracture with L1-L5 posterior instrumented
fusion spanning the L3 level.

## 2021-11-29 IMAGING — MR MR LUMBAR SPINE WO/W CM
5 of 7 series · 34 of 48 positions shown · IV contrast (gadavist)
Comparison: None.

CLINICAL DATA: Altered mental status

EXAM:
MRI THORACIC AND LUMBAR SPINE WITHOUT AND WITH CONTRAST
TECHNIQUE: Multiplanar and multiecho pulse sequences of the thoracic and lumbar
spine were obtained without and with intravenous contrast.
CONTRAST:  6mL GADAVIST GADOBUTROL 1 MMOL/ML IV SOLN

[Series 1: T2 · sagittal · 4.0mm · 0.81mm/px · 4 of 14 slices shown (1 of 2)]
[im 1/14]
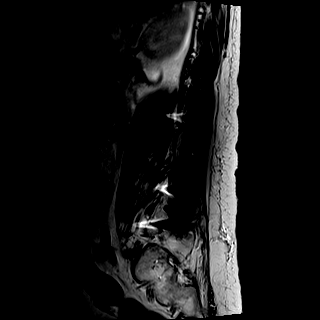
[im 5/14]
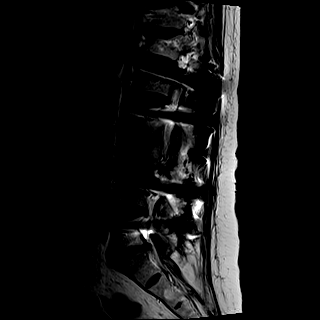
[im 9/14]
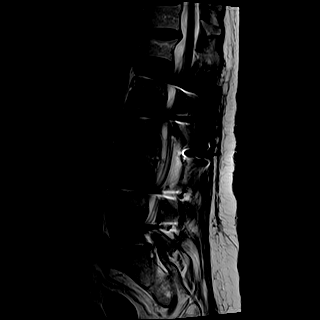
[im 14/14]
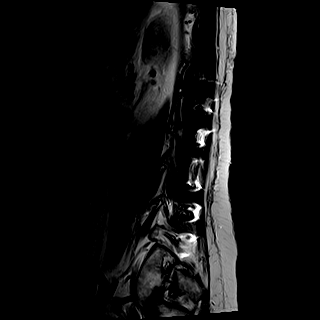

[Series 2: T1 · sagittal · 4.0mm · 0.81mm/px · 4 of 14 slices shown (1 of 2)]
[im 1/14]
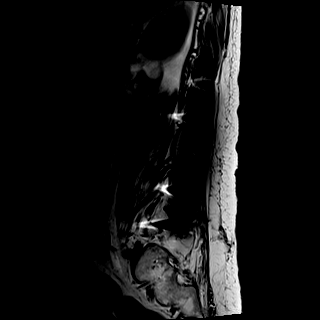
[im 5/14]
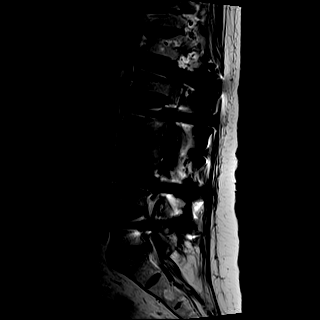
[im 9/14]
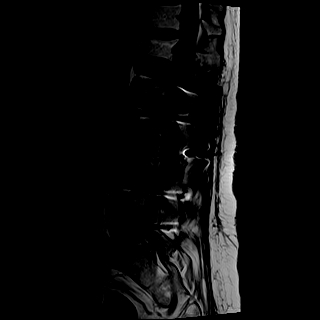
[im 14/14]
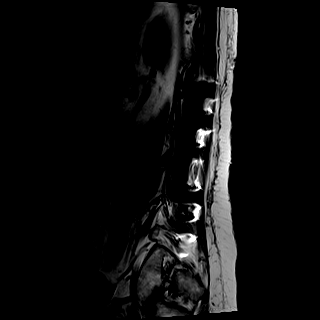

[Series 4: T2 · axial · 4.0mm · 0.78mm/px · z∈[-418,-183]mm · 11 of 38 slices shown (2 of 2)]
[im 1/38]
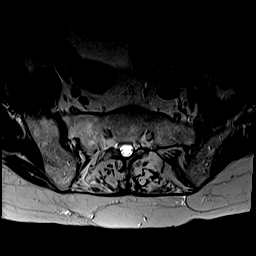
[im 4/38]
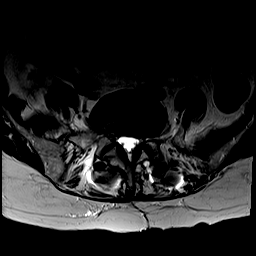
[im 8/38]
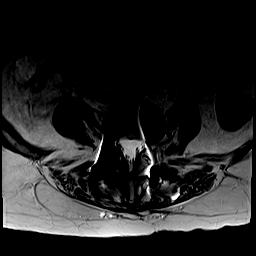
[im 12/38]
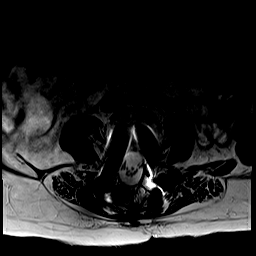
[im 15/38]
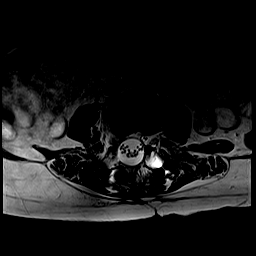
[im 19/38]
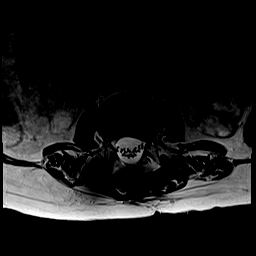
[im 23/38]
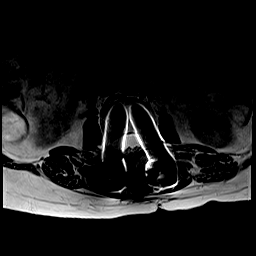
[im 26/38]
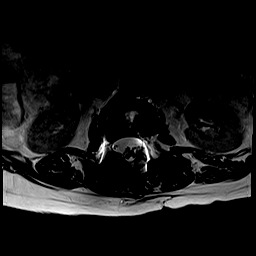
[im 30/38]
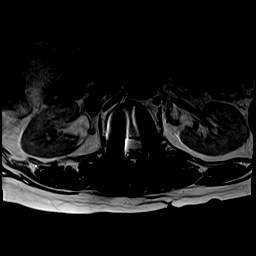
[im 34/38]
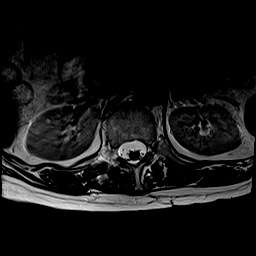
[im 38/38]
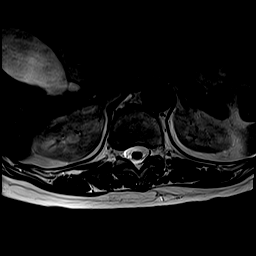

[Series 5: T1 · axial · 4.0mm · 0.39mm/px · z∈[-418,-183]mm · 11 of 38 slices shown (2 of 2)]
[im 1/38]
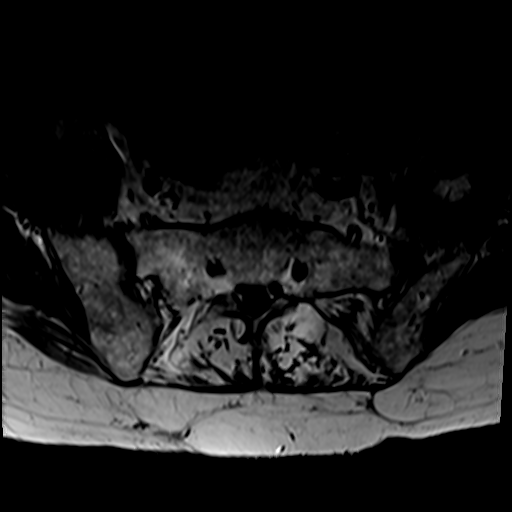
[im 4/38]
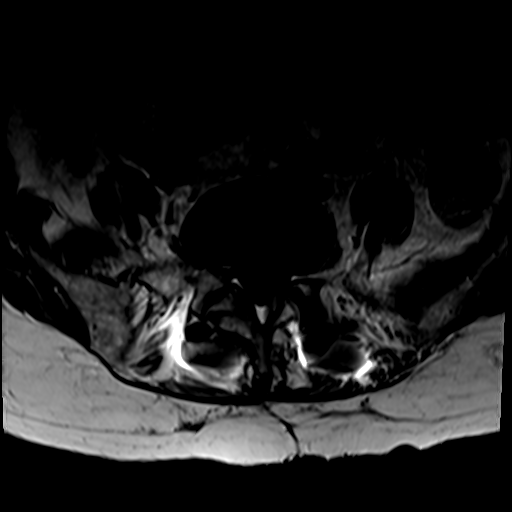
[im 8/38]
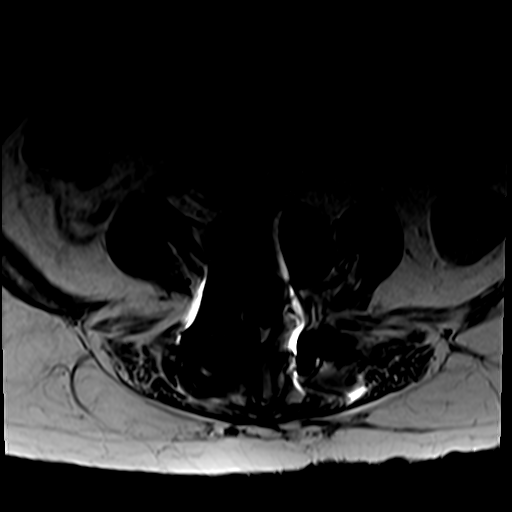
[im 12/38]
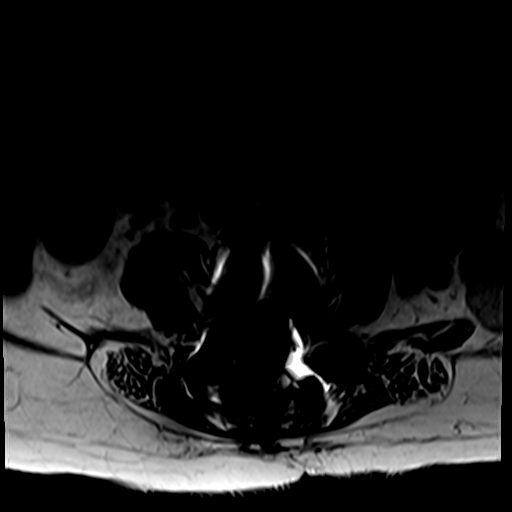
[im 15/38]
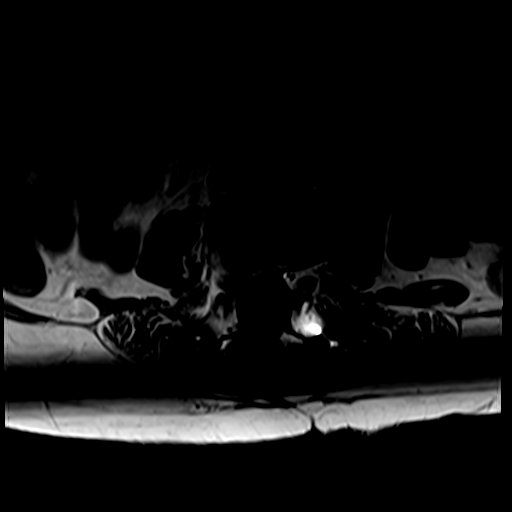
[im 19/38]
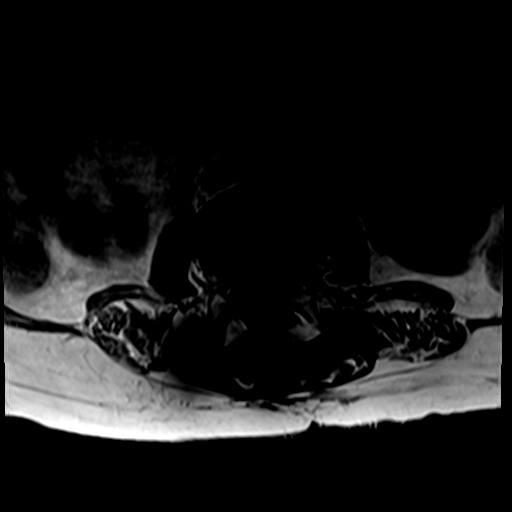
[im 23/38]
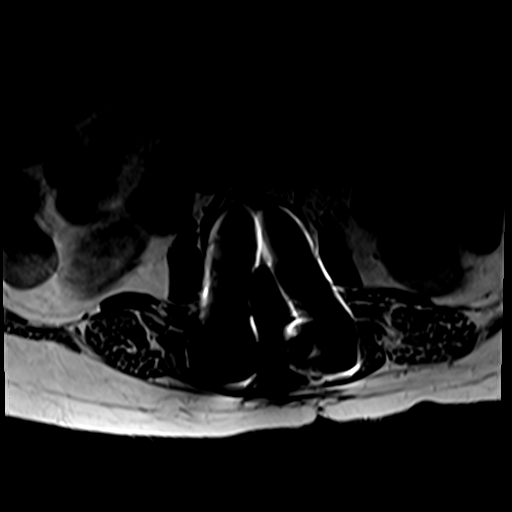
[im 26/38]
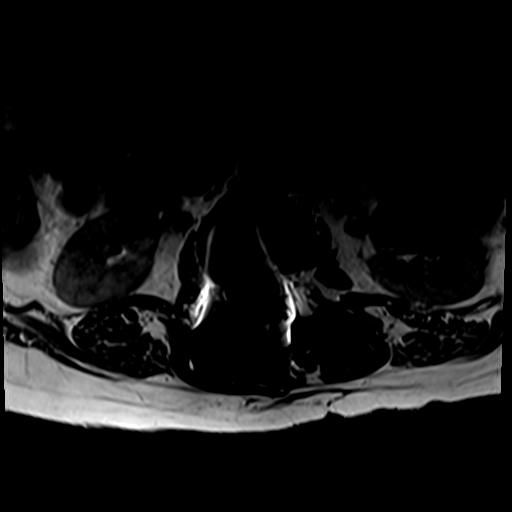
[im 30/38]
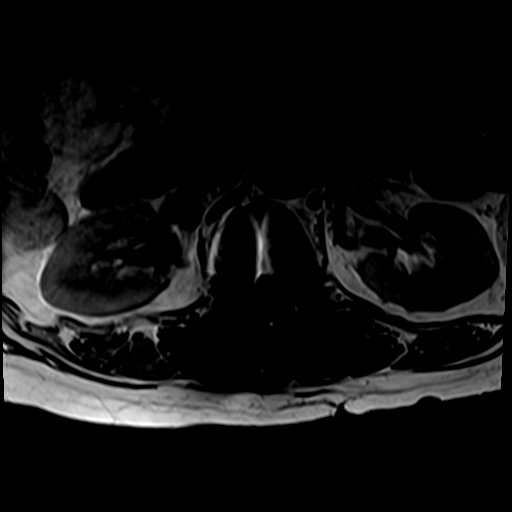
[im 34/38]
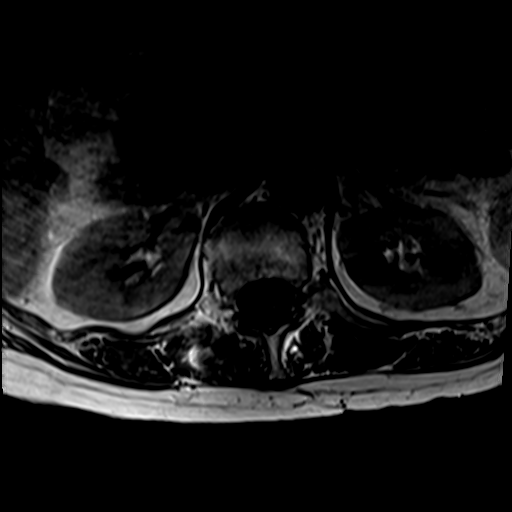
[im 38/38]
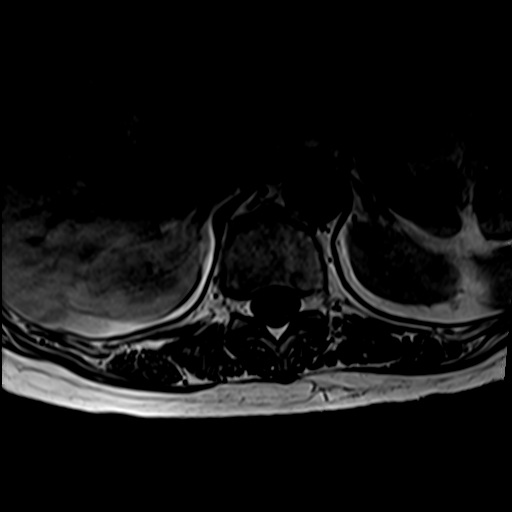

[Series 6: T1 fat-sat post-contrast · sagittal · 4.0mm · 0.81mm/px · 4 of 14 slices shown]
[im 1/14]
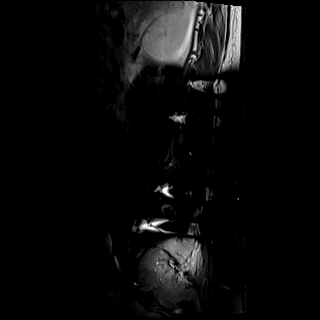
[im 5/14]
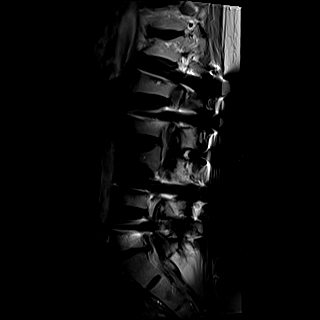
[im 9/14]
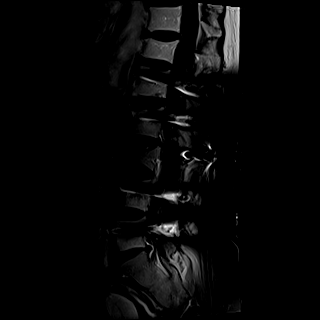
[im 14/14]
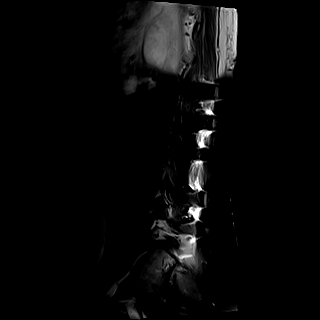

[34 of 48 positions shown; findings below may reference images not displayed]

FINDINGS: MRI THORACIC SPINE FINDINGS

Alignment:  Physiologic.

Vertebrae: Mild height loss of T8 without bone marrow edema

Cord:  Normal signal and morphology.

Paraspinal and other soft tissues: Negative.

Disc levels:

No spinal canal stenosis.

MRI LUMBAR SPINE FINDINGS

Segmentation:  Standard.

Alignment:  Physiologic.

Vertebrae: Chronic compression deformity of L3. There is posterior
instrumented fusion from L1-L5 spanning the L3 level.

Conus medullaris: Extends to the L1 level and appears normal.

Paraspinal and other soft tissues: Negative.

Disc levels:

There is no spinal canal stenosis or neural impingement.
IMPRESSION: 1. No acute abnormality of the thoracic or lumbar spine.
2. Mild chronic height loss of T8 with no bone marrow edema.
3. Chronic L3 compression fracture with L1-L5 posterior instrumented
fusion spanning the L3 level.

## 2021-11-29 IMAGING — MR MR HEAD W/O CM
13 series · 48 of 48 positions shown · non-contrast
Comparison: None.

CLINICAL DATA: Altered mental status

EXAM:
MRI HEAD WITHOUT CONTRAST
TECHNIQUE: Multiplanar, multiecho pulse sequences of the brain and surrounding
structures were obtained without intravenous contrast.

[Series 5: ax dwi_tracew · axial · 3.0mm · 0.71mm/px · z∈[-68,+91]mm · 3 of 56 slices shown]
[im 1/56]
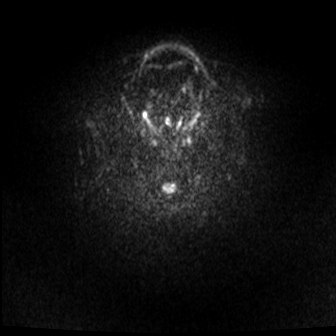
[im 28/56]
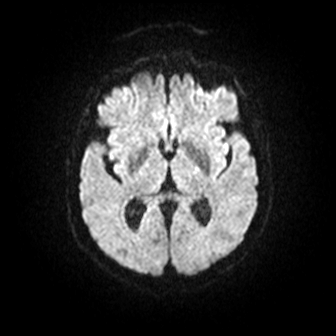
[im 56/56]
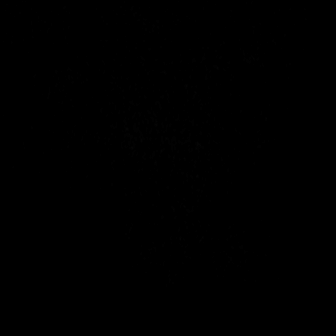

[Series 6: ax dwi_adc · axial · 3.0mm · 0.71mm/px · z∈[-68,+80]mm · 3 of 52 slices shown]
[im 1/52]
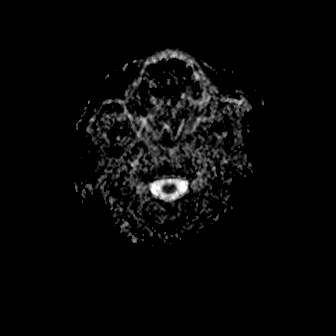
[im 26/52]
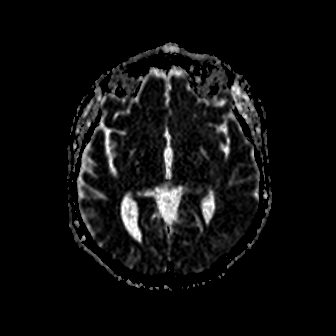
[im 52/52]
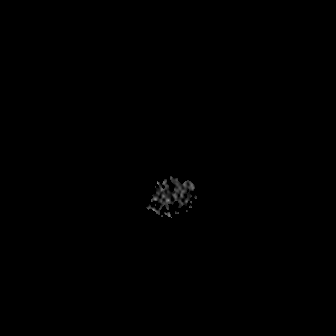

[Series 7: cor dwi_tracew · coronal · 5.0mm · 0.68mm/px · 3 of 40 slices shown]
[im 1/40]
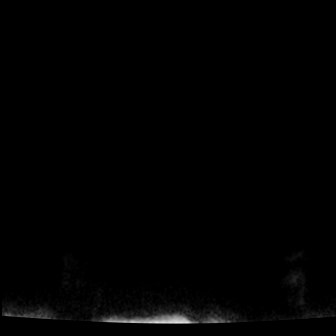
[im 20/40]
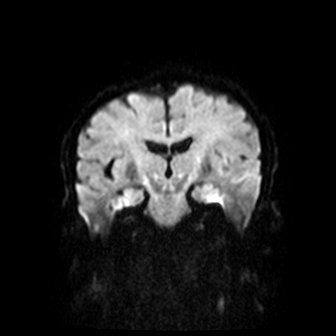
[im 40/40]
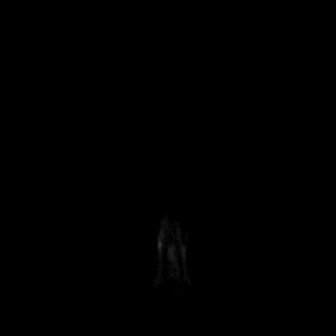

[Series 8: cor dwi_adc · coronal · 5.0mm · 0.68mm/px · 3 of 40 slices shown]
[im 1/40]
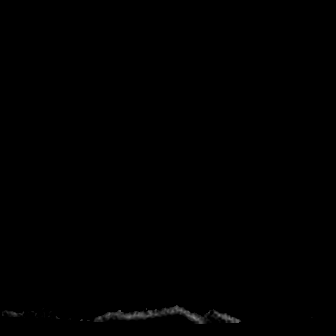
[im 20/40]
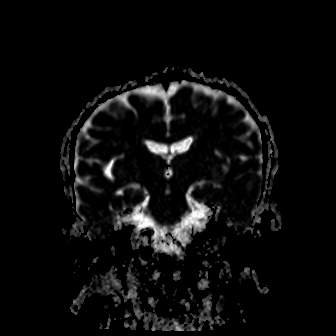
[im 40/40]
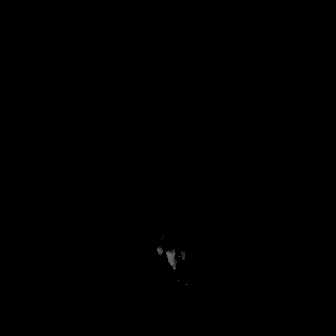

[Series 9: T1 · sagittal · 5.0mm · 0.47mm/px · 2 of 24 slices shown (1 of 2)]
[im 1/24]
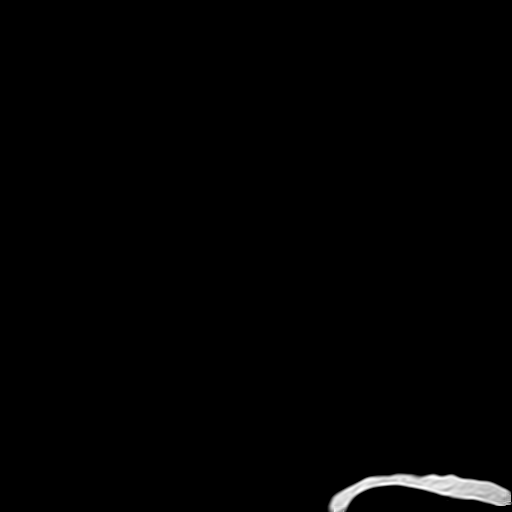
[im 24/24]
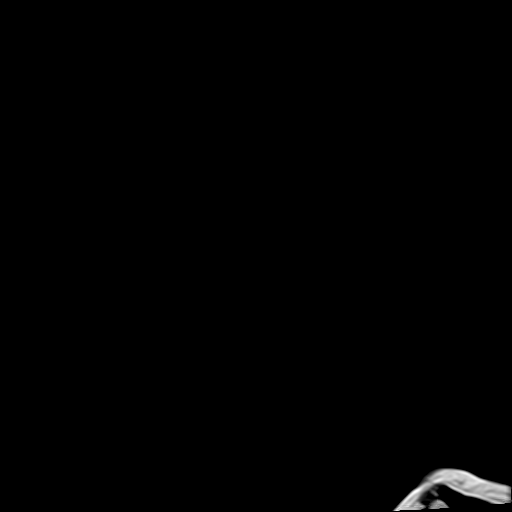

[Series 10: T2 · axial · 5.0mm · 0.86mm/px · z∈[-61,+78]mm · 2 of 25 slices shown (1 of 2)]
[im 1/25]
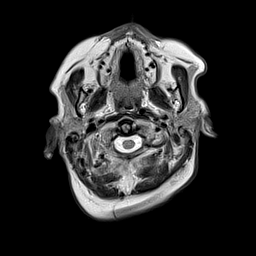
[im 25/25]
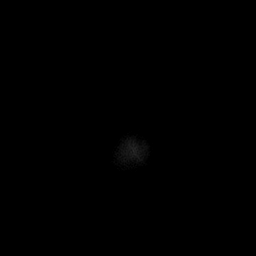

[Series 11: ax swi_mag · axial · 3.0mm · 0.90mm/px · z∈[-70,+90]mm · 4 of 56 slices shown]
[im 1/56]
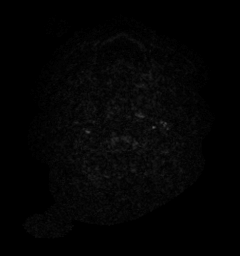
[im 19/56]
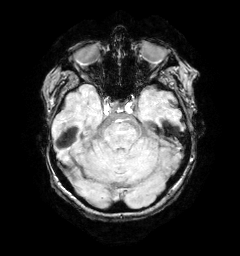
[im 37/56]
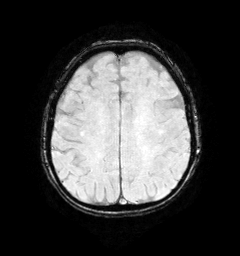
[im 56/56]
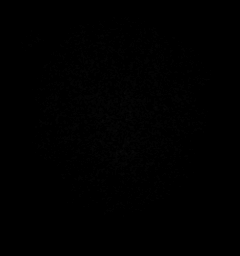

[Series 12: ax swi_pha · axial · 3.0mm · 0.90mm/px · z∈[-70,+90]mm · 4 of 56 slices shown]
[im 1/56]
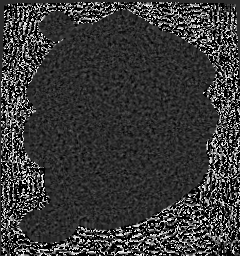
[im 19/56]
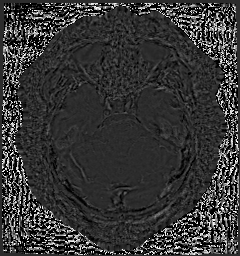
[im 37/56]
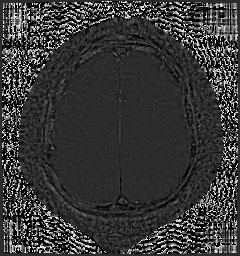
[im 56/56]
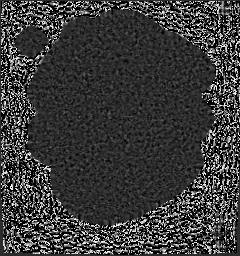

[Series 13: ax swi_swi · axial · 3.0mm · 0.90mm/px · z∈[-70,+90]mm · 4 of 56 slices shown]
[im 1/56]
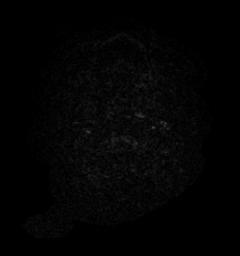
[im 19/56]
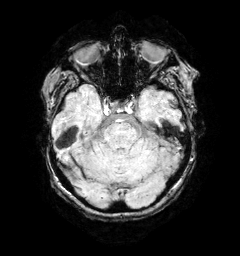
[im 37/56]
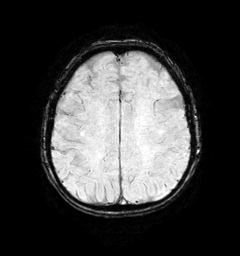
[im 56/56]
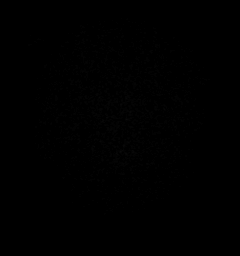

[Series 14: ax swi_swi_mip · axial · 24.0mm · 0.90mm/px · z∈[-60,+80]mm · 3 of 49 slices shown]
[im 1/49]
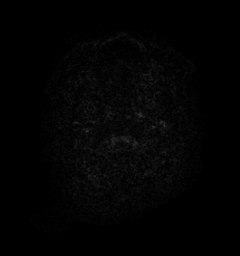
[im 25/49]
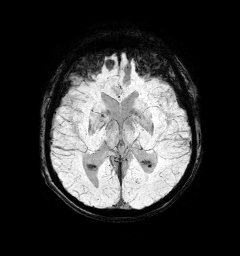
[im 49/49]
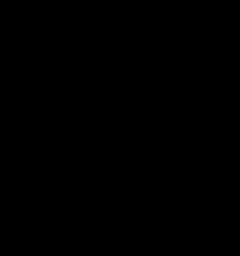

[Series 15: FLAIR · axial · 3.0mm · 0.69mm/px · z∈[-69,+87]mm · 4 of 55 slices shown]
[im 1/55]
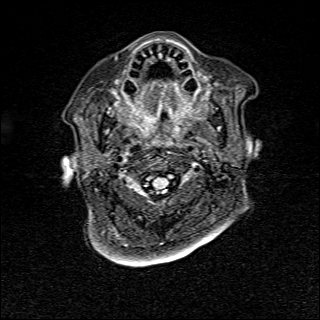
[im 19/55]
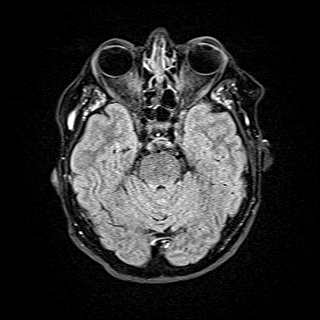
[im 37/55]
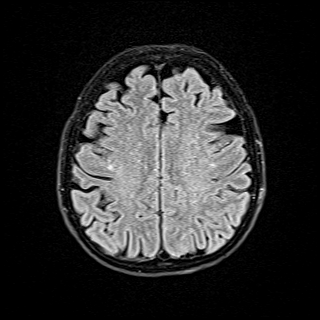
[im 55/55]
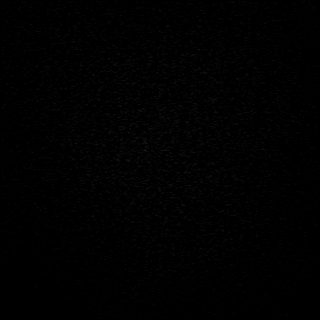

[Series 16: T1 · axial · 1.0mm · 0.98mm/px · z∈[-64,+90]mm · 11 of 160 slices shown (2 of 2)]
[im 1/160]
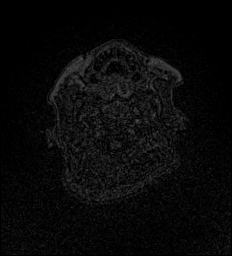
[im 16/160]
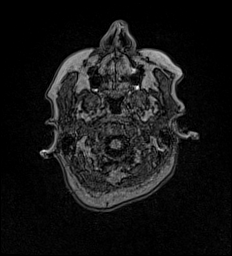
[im 32/160]
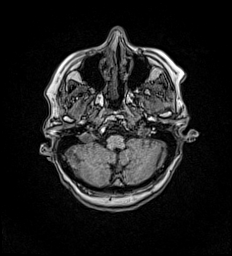
[im 48/160]
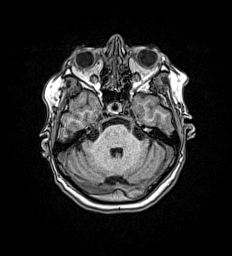
[im 64/160]
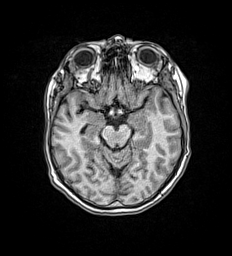
[im 80/160]
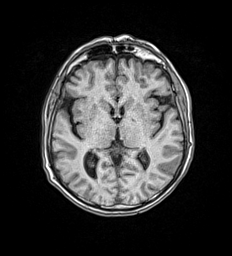
[im 96/160]
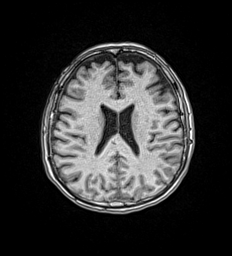
[im 112/160]
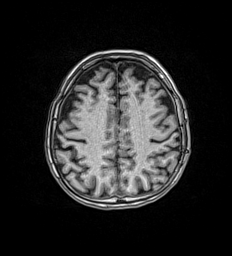
[im 128/160]
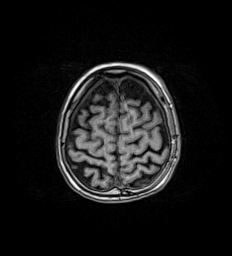
[im 144/160]
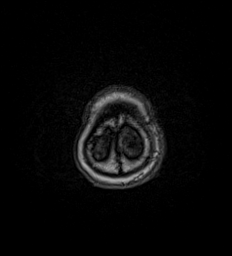
[im 160/160]
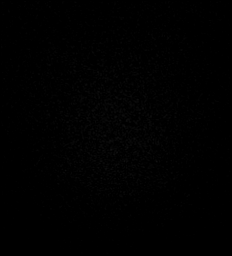

[Series 17: T2 · coronal · 5.0mm · 0.86mm/px · 2 of 30 slices shown (2 of 2)]
[im 1/30]
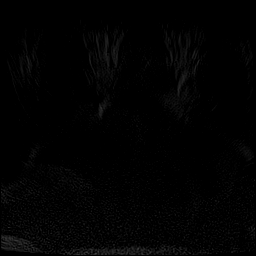
[im 30/30]
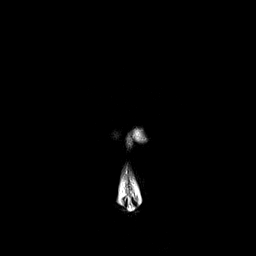

[48 of 48 positions shown; findings below may reference images not displayed]

FINDINGS: Brain: No acute infarct, mass effect or extra-axial collection. No
acute or chronic hemorrhage. There is multifocal hyperintense
T2-weighted signal within the white matter. Parenchymal volume and
CSF spaces are normal. The midline structures are normal.

Vascular: Major flow voids are preserved.

Skull and upper cervical spine: Normal calvarium and skull base.
Visualized upper cervical spine and soft tissues are normal.

Sinuses/Orbits:No paranasal sinus fluid levels or advanced mucosal
thickening. No mastoid or middle ear effusion. Normal orbits.
IMPRESSION: 1. No acute intracranial abnormality.
2. Findings of chronic small vessel ischemia.

## 2021-11-29 IMAGING — DX DG CHEST 1V
1 series · 1 of 1 positions shown · non-contrast
Comparison: [DATE]; thoracic spine radiograph dated [DATE]

CLINICAL DATA: weakness

EXAM:
CHEST  1 VIEW

[chest ap]
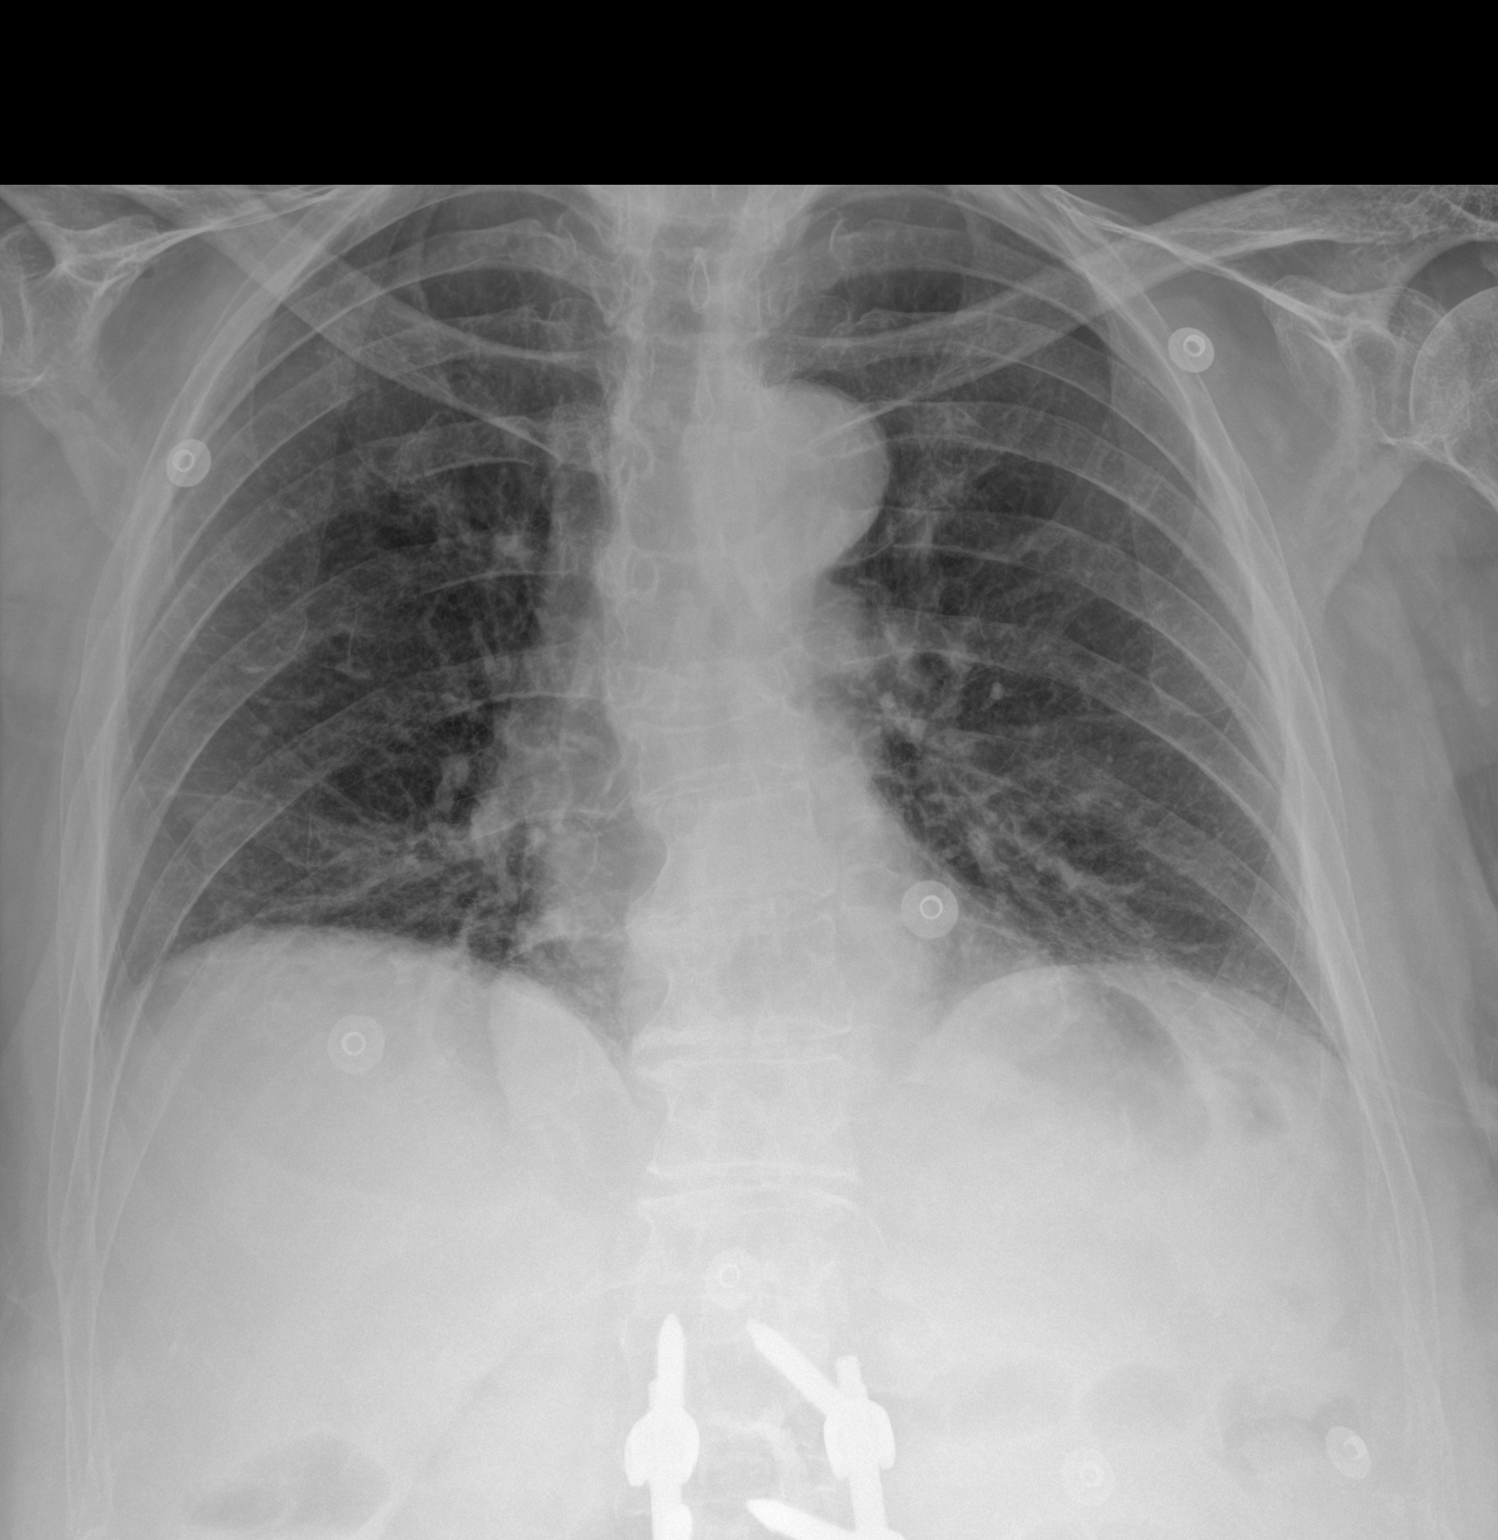

[1 of 1 positions shown; findings below may reference images not displayed]

FINDINGS: The cardiomediastinal silhouette is unchanged in contour. No pleural
effusion. No pneumothorax. Bibasilar platelike opacities. Visualized
abdomen is unremarkable. Status post posterior fixation of the
lumbar spine. Compression fracture deformity of a midthoracic
vertebral body, not definitively present on prior radiograph.
IMPRESSION: 1. Bibasilar platelike opacities, likely atelectasis. Differential
considerations include infection and aspiration.

2. Age-indeterminate compression fracture deformity of a midthoracic
vertebral body. Recommend correlation with point tenderness.

## 2021-11-29 MED ORDER — ASPIRIN 81 MG PO CHEW
81.0000 mg | CHEWABLE_TABLET | Freq: Every day | ORAL | Status: DC
  Administered 2021-11-30 – 2021-12-08 (×8): 81 mg via ORAL
  Filled 2021-11-29 (×9): qty 1

## 2021-11-29 MED ORDER — STROKE: EARLY STAGES OF RECOVERY BOOK: Freq: Once | Status: DC

## 2021-11-29 MED ORDER — SODIUM CHLORIDE 0.9 % IV BOLUS
1000.0000 mL | Freq: Once | INTRAVENOUS | Status: AC
  Administered 2021-11-29: 1000 mL via INTRAVENOUS

## 2021-11-29 MED ORDER — IOHEXOL 350 MG/ML SOLN
75.0000 mL | Freq: Once | INTRAVENOUS | Status: AC | PRN
  Administered 2021-11-29: 75 mL via INTRAVENOUS

## 2021-11-29 MED ORDER — SENNOSIDES-DOCUSATE SODIUM 8.6-50 MG PO TABS: 1.0000 | ORAL_TABLET | Freq: Every evening | ORAL | Status: DC | PRN

## 2021-11-29 MED ORDER — ENOXAPARIN SODIUM 40 MG/0.4ML IJ SOSY
40.0000 mg | PREFILLED_SYRINGE | Freq: Every day | INTRAMUSCULAR | Status: DC
  Administered 2021-11-30 – 2021-12-07 (×7): 40 mg via SUBCUTANEOUS
  Filled 2021-11-29 (×7): qty 0.4

## 2021-11-29 MED ORDER — HYDRALAZINE HCL 10 MG PO TABS
10.0000 mg | ORAL_TABLET | Freq: Four times a day (QID) | ORAL | Status: AC | PRN
  Filled 2021-11-29: qty 1

## 2021-11-29 MED ORDER — ACETAMINOPHEN 160 MG/5ML PO SOLN
650.0000 mg | ORAL | Status: DC | PRN
  Filled 2021-11-29: qty 20.3

## 2021-11-29 MED ORDER — GADOBUTROL 1 MMOL/ML IV SOLN
6.0000 mL | Freq: Once | INTRAVENOUS | Status: AC | PRN
  Administered 2021-11-29: 6 mL via INTRAVENOUS

## 2021-11-29 MED ORDER — ACETAMINOPHEN 325 MG PO TABS: 650.0000 mg | ORAL_TABLET | ORAL | Status: DC | PRN

## 2021-11-29 MED ORDER — ATORVASTATIN CALCIUM 20 MG PO TABS
40.0000 mg | ORAL_TABLET | Freq: Every day | ORAL | Status: DC
  Administered 2021-11-30 – 2021-12-07 (×7): 40 mg via ORAL
  Filled 2021-11-29 (×7): qty 2

## 2021-11-29 MED ORDER — ASPIRIN-ACETAMINOPHEN-CAFFEINE 250-250-65 MG PO TABS
1.0000 | ORAL_TABLET | Freq: Four times a day (QID) | ORAL | Status: DC | PRN
  Filled 2021-11-29: qty 1

## 2021-11-29 MED ORDER — ACETAMINOPHEN 650 MG RE SUPP: 650.0000 mg | RECTAL | Status: DC | PRN

## 2021-11-29 MED ORDER — SODIUM CHLORIDE 0.9 % IV SOLN: INTRAVENOUS | Status: AC

## 2021-11-29 MED ORDER — MELATONIN 5 MG PO TABS
5.0000 mg | ORAL_TABLET | Freq: Every day | ORAL | Status: DC
  Administered 2021-11-30 – 2021-12-07 (×7): 5 mg via ORAL
  Filled 2021-11-29 (×7): qty 1

## 2021-11-29 MED ORDER — ASPIRIN 81 MG PO CHEW: 81.0000 mg | CHEWABLE_TABLET | Freq: Every day | ORAL | Status: DC

## 2021-11-29 NOTE — ED Notes (Signed)
Rainbow of tubes sent down along with two sets of cultures ?

## 2021-11-29 NOTE — Assessment & Plan Note (Signed)
-   Resumed melatonin 5 mg p.o. nightly ?

## 2021-11-29 NOTE — ED Notes (Signed)
Family at bedside. Family advised pt had almost fully recovered from her last stroke deficits. She was seen [facility] Wednesday last week and they were fixing to release her to drive. Last seen well was Thursday/Friday. Family advised she is presenting the same as she did last year when she had her stroke only this is worse.  ?

## 2021-11-29 NOTE — ED Notes (Signed)
RN called to the lab to ask what was the delay in resulting out patients urgent stat labs. Lab staffed advised "it was all hemolyzed and needs to be recollected".  ?

## 2021-11-29 NOTE — Assessment & Plan Note (Addendum)
With aphasia ?Stroke-like symptoms ?- CT a of the head and neck with and without contrast in the emergency department was unremarkable. ?-  Neurology, Dr. Quinn Axe has been consulted via staff message ?- MRI of the brain ordered ?- TSH was within normal ?- Check ammonia, B12, ethanol, UDS, procalcitonin, EEG ?- Fasting lipid and A1c ordered ?- Frequent neuro vascular checks ?- N.p.o. pending swallow study due to patient having aphasia ?- PT, OT, SLP ?- Fall precaution  ?

## 2021-11-29 NOTE — ED Notes (Signed)
Patient transported to MRI 
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
## 2021-11-29 NOTE — ED Notes (Signed)
IN and OUT cath ?

## 2021-11-29 NOTE — Hospital Course (Addendum)
[name] is a 76 year old female with history of stroke, hyperlipidemia, insomnia, who presents to the emergency department for chief concerns of altered mental status. ? ?Initial vitals in the emergency department showed temperature of 98, respiration rate of 25, blood pressure 168/118, SPO2 of 98% on room air. ? ?Serum sodium 132, potassium 4.1, chloride 98, bicarb 24, BUN of 20, serum creatinine of 0.70, GFR greater than 60, nonfasting blood glucose 105, WBC 9, hemoglobin 14.3, platelets of 215. ? ?COVID/influenza A/influenza B PCR were negative. ? ?T. bili mildly elevated at 1.6. ? ?TSH was within normal limits. ? ?CT of the head and neck with contrast in the ED was read as: Normal head CT for age.  Normal CT angiography of the head and neck. ? ?Portable chest x-ray: Bibasilar platelike opacities, likely atelectasis.  Differential considerations include infection and aspiration.  Age-indeterminate compression fracture deformity of the mild thoracic vertebral body. ? ?EDP treatment: Sodium chloride 1 L bolus. ?

## 2021-11-29 NOTE — Assessment & Plan Note (Signed)
-   Atorvastatin 40 mg p.o. daily resumed ?

## 2021-11-29 NOTE — ED Notes (Signed)
Unable to complete swallow screen at this time as pt is non-verbal and cannot follow commands.  ?

## 2021-11-29 NOTE — Assessment & Plan Note (Signed)
-   Resumed home aspirin 81 mg daily, atorvastatin 40 mg daily ?

## 2021-11-29 NOTE — ED Provider Notes (Signed)
? ?[facility] ?Provider Note ? ? ? Event Date/Time  ? First MD Initiated Contact with Patient 11/29/21 1500   ?  (approximate) ? ? ?History  ? ?Chief Complaint ?Altered Mental Status ? ? ?HPI ?Teresa Sherman is a 76 y.o. female, history of CVA, anxiety, depression, GERD, presents to the emergency department for evaluation of altered mental status. Patient was brought in by EMS from home after being discovered by a [employer] cable guy who saw her through a window standing naked in the living room.  He promptly called 911.  Patient appears to have significant aphasia at this time and is unable to answer any questions. ? ?Spoke with the patient's daughter, who arrived shortly after the patient, stated that the patient normally lives at home alone.  She is able to speak and function with minimal issues.  She does not normally have any weakness or difficulty speaking, though does tend to be emotionally labile and speak inappropriate things at times.  However, these have been ongoing issues.  Her last known normal was 2 to 3 days ago when her family saw her last.  She did not have any active symptoms at that time or have any medical complaints.  ? ?History Limitations: Altered mental status. ? ?    ? ? ?Physical Exam  ?Triage Vital Signs: ?ED Triage Vitals  ?Enc Vitals Group  ?   BP 11/29/21 1443 133/84  ?   Pulse --   ?   Resp 11/29/21 1443 (!) 22  ?   Temp 11/29/21 1443 98 ?F (36.7 ?C)  ?   Temp Source 11/29/21 1443 Oral  ?   SpO2 11/29/21 1443 96 %  ?   Weight 11/29/21 1445 135 lb (61.2 kg)  ?   Height 11/29/21 1445 '4\' 11"'$  (1.499 m)  ?   Head Circumference --   ?   Peak Flow --   ?   Pain Score 11/29/21 1444 0  ?   Pain Loc --   ?   Pain Edu? --   ?   Excl. in [place]? --   ? ? ?Most recent vital signs: ?Vitals:  ? 11/29/21 1730 11/29/21 1800  ?BP: 139/85 (!) 146/86  ?Pulse: 73 77  ?Resp: 20 20  ?Temp:    ?SpO2: 97% 97%  ? ? ?General: Awake, appears distressed.  Unable to answer questions about  person place time or event. ?Skin: Warm, dry. No rashes or lesions.  ?Eyes: PERRL. Conjunctivae normal.  Unable to cooperate with extraocular movement exam. ?ENT: Throat clear, no erythema or exudates. Uvula midline.  ?Neck: Normal ROM. No nuchal rigidity.  ?CV: Good peripheral perfusion.  ?Resp: Normal effort.  Lungs are clear bilaterally in the apices and bases. ?Abd: Soft, non-tender. No distention.  ?Neuro: Significantly altered from baseline.  Appears to be aphasic, she is struggling to formulate words while attempting to speak.  Notable weakness in the right upper extremity and right lower extremity. She has her right arm flexed towards her chest which according to family is unusual for her.  She is able to move her left upper and lower extremity without difficulty.  Pulses intact distally in all extremities.  Unable to tell if patient has any sensory deficits due to lack of cooperation.  No facial droop present.  Patient is unable to cooperate with full cranial nerve exam. ?MSK: No gross deformities. ? ? ?Physical Exam ? ? ? ?ED Results / Procedures / Treatments  ?Labs ?(all labs  ordered are listed, but only abnormal results are displayed) ?Labs Reviewed  ?COMPREHENSIVE METABOLIC PANEL - Abnormal; Notable for the following components:  ?    Result Value  ? Sodium 132 (*)   ? Glucose, Bld 105 (*)   ? Total Bilirubin 1.6 (*)   ? All other components within normal limits  ?RESP PANEL BY RT-PCR (FLU A&B, COVID) ARPGX2  ?CBC WITH DIFFERENTIAL/PLATELET  ?PROTIME-INR  ?APTT  ?TSH  ?URINALYSIS, ROUTINE W REFLEX MICROSCOPIC  ?URINE DRUG SCREEN, QUALITATIVE ([facility] ONLY)  ?ETHANOL  ? ? ? ?EKG ?Sinus rhythm, rate of 78, no T-segment changes, abnormal R wave progression, no axis deviations, no AV blocks. ? ? ?RADIOLOGY ? ?ED Provider Interpretation: I personally viewed and interpreted these images.  Chest x-ray shows no evidence of pneumonia.  CT angio does not show any epidural/subdural hematomas. ? ?CT ANGIO HEAD NECK W  WO CM ? ?Result Date: 11/29/2021 ?CLINICAL DATA:  Neuro deficit, acute, stroke suspected. Abnormal behavior. EXAM: CT ANGIOGRAPHY HEAD AND NECK TECHNIQUE: Multidetector CT imaging of the head and neck was performed using the standard protocol during bolus administration of intravenous contrast. Multiplanar CT image reconstructions and MIPs were obtained to evaluate the vascular anatomy. Carotid stenosis measurements (when applicable) are obtained utilizing NASCET criteria, using the distal internal carotid diameter as the denominator. RADIATION DOSE REDUCTION: This exam was performed according to the departmental dose-optimization program which includes automated exposure control, adjustment of the mA and/or kV according to patient size and/or use of iterative reconstruction technique. CONTRAST:  34m OMNIPAQUE IOHEXOL 350 MG/ML SOLN COMPARISON:  Head CT 10/22/2018 FINDINGS: CT HEAD FINDINGS Brain: Mild age related volume loss. No evidence of old or acute focal infarction, mass lesion, hemorrhage, hydrocephalus or extra-axial collection. Vascular: No abnormal vascular finding. Skull: Normal Sinuses: Clear Orbits: Normal Review of the MIP images confirms the above findings CTA NECK FINDINGS Aortic arch: Aortic arch is normal. Right carotid system: Common carotid artery is normal. Minimal calcified plaque at the bifurcation but no stenosis. Cervical ICA is normal. Left carotid system: Common carotid artery widely patent to the bifurcation. The bifurcation is normal. Cervical ICA is normal. Vertebral arteries: Both vertebral arteries are patent at their origins and through the cervical region. The left vertebral artery is strongly dominant. Skeleton: Minimal spondylosis. Other neck: No mass or lymphadenopathy. Upper chest: Negative Review of the MIP images confirms the above findings CTA HEAD FINDINGS Anterior circulation: Both internal carotid arteries are patent through the skull base and siphon regions. No siphon  stenosis. The anterior and middle cerebral vessels are normal. No large vessel occlusion or proximal stenosis. No aneurysm or vascular malformation. Posterior circulation: Both vertebral arteries are patent through the foramen magnum to the basilar. No basilar stenosis. Posterior circulation branch vessels are normal. Venous sinuses: Normal Anatomic variants: None Review of the MIP images confirms the above findings IMPRESSION: Normal head CT for age. Normal CT angiography of the head and neck. Electronically Signed   By: MNelson ChimesM.D.   On: 11/29/2021 18:08  ? ?DG Chest 1 View ? ?Result Date: 11/29/2021 ?CLINICAL DATA:  weakness EXAM: CHEST  1 VIEW COMPARISON:  June 19, 2013; thoracic spine radiograph dated November 01, 2014 FINDINGS: The cardiomediastinal silhouette is unchanged in contour. No pleural effusion. No pneumothorax. Bibasilar platelike opacities. Visualized abdomen is unremarkable. Status post posterior fixation of the lumbar spine. Compression fracture deformity of a midthoracic vertebral body, not definitively present on prior radiograph. IMPRESSION: 1. Bibasilar platelike opacities, likely atelectasis. Differential  considerations include infection and aspiration. 2. Age-indeterminate compression fracture deformity of a midthoracic vertebral body. Recommend correlation with point tenderness. Electronically Signed   By: Valentino Saxon M.D.   On: 11/29/2021 16:07   ? ?PROCEDURES: ? ?Critical Care performed:  ? ?.Critical Care ?Performed by: Teodoro Spray, PA ?Authorized by: Teodoro Spray, PA  ? ?Critical care provider statement:  ?  Critical care time (minutes):  30 ?  Critical care was time spent personally by me on the following activities:  Development of treatment plan with patient or surrogate, discussions with consultants, evaluation of patient's response to treatment, examination of patient, ordering and review of laboratory studies, ordering and review of radiographic  studies, ordering and performing treatments and interventions, pulse oximetry, re-evaluation of patient's condition and review of old charts ? ? ? ?MEDICATIONS ORDERED IN ED: ?Medications  ?iohexol (OMNIPAQUE)

## 2021-11-29 NOTE — ED Triage Notes (Signed)
BIB ems from home. Pt is altered. Found standing in living room naked. [employer] guy saw her through the window and he called 39.  family saw her 2-3 days ago and she was normal. Pt has HX of stroke last year.  ?96.7 ?RR 15-30 ?80-100 HR  ?112 BGL ? ?

## 2021-11-30 ENCOUNTER — Other Ambulatory Visit: Payer: Self-pay

## 2021-11-30 ENCOUNTER — Inpatient Hospital Stay: Payer: Medicare HMO

## 2021-11-30 ENCOUNTER — Encounter: Payer: Self-pay | Admitting: Internal Medicine

## 2021-11-30 DIAGNOSIS — R29898 Other symptoms and signs involving the musculoskeletal system: Secondary | ICD-10-CM | POA: Diagnosis not present

## 2021-11-30 DIAGNOSIS — E871 Hypo-osmolality and hyponatremia: Secondary | ICD-10-CM | POA: Diagnosis present

## 2021-11-30 DIAGNOSIS — G3184 Mild cognitive impairment, so stated: Secondary | ICD-10-CM | POA: Diagnosis present

## 2021-11-30 DIAGNOSIS — R471 Dysarthria and anarthria: Secondary | ICD-10-CM | POA: Diagnosis present

## 2021-11-30 DIAGNOSIS — R519 Headache, unspecified: Secondary | ICD-10-CM | POA: Diagnosis not present

## 2021-11-30 DIAGNOSIS — G4709 Other insomnia: Secondary | ICD-10-CM | POA: Diagnosis not present

## 2021-11-30 DIAGNOSIS — Z888 Allergy status to other drugs, medicaments and biological substances status: Secondary | ICD-10-CM | POA: Diagnosis not present

## 2021-11-30 DIAGNOSIS — F419 Anxiety disorder, unspecified: Secondary | ICD-10-CM | POA: Diagnosis present

## 2021-11-30 DIAGNOSIS — Z79899 Other long term (current) drug therapy: Secondary | ICD-10-CM | POA: Diagnosis not present

## 2021-11-30 DIAGNOSIS — Z885 Allergy status to narcotic agent status: Secondary | ICD-10-CM | POA: Diagnosis not present

## 2021-11-30 DIAGNOSIS — G9340 Encephalopathy, unspecified: Secondary | ICD-10-CM

## 2021-11-30 DIAGNOSIS — D72828 Other elevated white blood cell count: Secondary | ICD-10-CM | POA: Diagnosis not present

## 2021-11-30 DIAGNOSIS — F418 Other specified anxiety disorders: Secondary | ICD-10-CM | POA: Diagnosis not present

## 2021-11-30 DIAGNOSIS — R443 Hallucinations, unspecified: Secondary | ICD-10-CM | POA: Diagnosis not present

## 2021-11-30 DIAGNOSIS — Z8673 Personal history of transient ischemic attack (TIA), and cerebral infarction without residual deficits: Secondary | ICD-10-CM | POA: Diagnosis not present

## 2021-11-30 DIAGNOSIS — J690 Pneumonitis due to inhalation of food and vomit: Secondary | ICD-10-CM | POA: Diagnosis not present

## 2021-11-30 DIAGNOSIS — R4182 Altered mental status, unspecified: Secondary | ICD-10-CM | POA: Diagnosis present

## 2021-11-30 DIAGNOSIS — G928 Other toxic encephalopathy: Secondary | ICD-10-CM | POA: Diagnosis present

## 2021-11-30 DIAGNOSIS — Z981 Arthrodesis status: Secondary | ICD-10-CM | POA: Diagnosis not present

## 2021-11-30 DIAGNOSIS — G4700 Insomnia, unspecified: Secondary | ICD-10-CM | POA: Diagnosis present

## 2021-11-30 DIAGNOSIS — R4701 Aphasia: Secondary | ICD-10-CM

## 2021-11-30 DIAGNOSIS — Z66 Do not resuscitate: Secondary | ICD-10-CM | POA: Diagnosis present

## 2021-11-30 DIAGNOSIS — Z7982 Long term (current) use of aspirin: Secondary | ICD-10-CM | POA: Diagnosis not present

## 2021-11-30 DIAGNOSIS — K219 Gastro-esophageal reflux disease without esophagitis: Secondary | ICD-10-CM | POA: Diagnosis present

## 2021-11-30 DIAGNOSIS — E876 Hypokalemia: Secondary | ICD-10-CM | POA: Diagnosis present

## 2021-11-30 DIAGNOSIS — Z20822 Contact with and (suspected) exposure to covid-19: Secondary | ICD-10-CM | POA: Diagnosis present

## 2021-11-30 DIAGNOSIS — F32A Depression, unspecified: Secondary | ICD-10-CM | POA: Diagnosis present

## 2021-11-30 DIAGNOSIS — Z881 Allergy status to other antibiotic agents status: Secondary | ICD-10-CM | POA: Diagnosis not present

## 2021-11-30 DIAGNOSIS — E785 Hyperlipidemia, unspecified: Secondary | ICD-10-CM | POA: Diagnosis present

## 2021-11-30 DIAGNOSIS — Z79891 Long term (current) use of opiate analgesic: Secondary | ICD-10-CM | POA: Diagnosis not present

## 2021-11-30 LAB — LIPID PANEL
Cholesterol: 120 mg/dL (ref 0–200)
HDL: 52 mg/dL (ref >40)
LDL Cholesterol: 59 mg/dL (ref 0–99)
Total CHOL/HDL Ratio: 2.3 RATIO
Triglycerides: 45 mg/dL (ref <150)
VLDL: 9 mg/dL (ref 0–40)

## 2021-11-30 LAB — CBC
HCT: 40.4 % (ref 36.0–46.0)
Hemoglobin: 13.8 g/dL (ref 12.0–15.0)
MCH: 29.9 pg (ref 26.0–34.0)
MCHC: 34.2 g/dL (ref 30.0–36.0)
MCV: 87.6 fL (ref 80.0–100.0)
Platelets: 201 10*3/uL (ref 150–400)
RBC: 4.61 MIL/uL (ref 3.87–5.11)
RDW: 14.6 % (ref 11.5–15.5)
WBC: 16 10*3/uL — ABNORMAL HIGH (ref 4.0–10.5)
nRBC: 0 % (ref 0.0–0.2)

## 2021-11-30 LAB — BASIC METABOLIC PANEL
Anion gap: 10 (ref 5–15)
BUN: 18 mg/dL (ref 8–23)
CO2: 20 mmol/L — ABNORMAL LOW (ref 22–32)
Calcium: 8.2 mg/dL — ABNORMAL LOW (ref 8.9–10.3)
Chloride: 101 mmol/L (ref 98–111)
Creatinine, Ser: 0.55 mg/dL (ref 0.44–1.00)
GFR, Estimated: >60 mL/min (ref >60)
Glucose, Bld: 86 mg/dL (ref 70–99)
Potassium: 3.4 mmol/L — ABNORMAL LOW (ref 3.5–5.1)
Sodium: 131 mmol/L — ABNORMAL LOW (ref 135–145)

## 2021-11-30 LAB — HEMOGLOBIN A1C
Hgb A1c MFr Bld: 5.4 % (ref 4.8–5.6)
Mean Plasma Glucose: 108.28 mg/dL

## 2021-11-30 LAB — VITAMIN B12: Vitamin B-12: 274 pg/mL (ref 180–914)

## 2021-11-30 LAB — ETHANOL: Alcohol, Ethyl (B): <10 mg/dL (ref <10)

## 2021-11-30 IMAGING — MR MR CERVICAL SPINE WO/W CM
5 of 8 series · 28 of 48 positions shown · IV contrast (gadavist)
Comparison: None available.

CLINICAL DATA: Initial evaluation for right upper extremity
hemiplegia.

EXAM:
MRI CERVICAL SPINE WITHOUT AND WITH CONTRAST
TECHNIQUE: Multiplanar and multiecho pulse sequences of the cervical spine, to
include the craniocervical junction and cervicothoracic junction,
were obtained without and with intravenous contrast.
CONTRAST:  6mL GADAVIST GADOBUTROL 1 MMOL/ML IV SOLN

[Series 13: T2 · sagittal · 3.0mm · 0.62mm/px · 4 of 15 slices shown (1 of 2)]
[im 1/15]
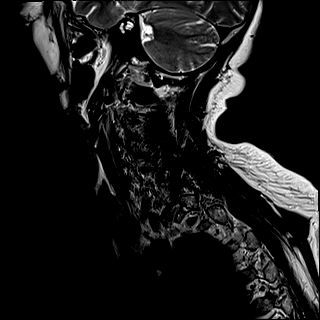
[im 5/15]
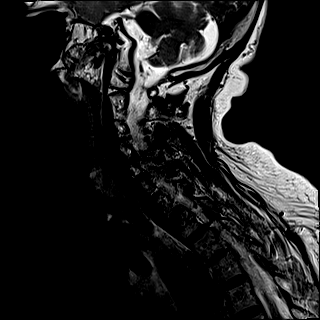
[im 10/15]
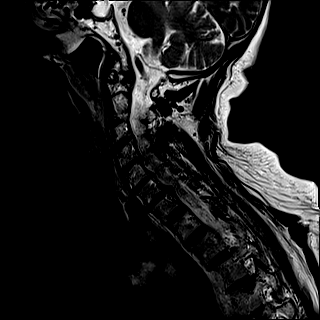
[im 15/15]
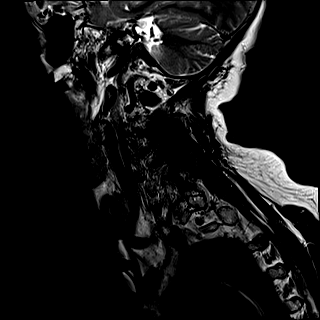

[Series 15: STIR · sagittal · 3.0mm · 0.62mm/px · 4 of 15 slices shown]
[im 1/15]
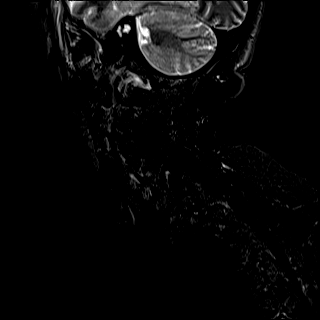
[im 5/15]
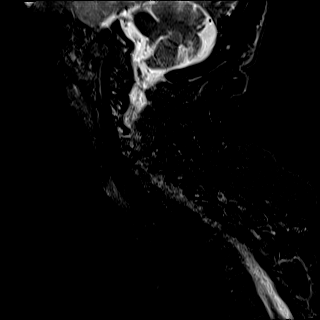
[im 10/15]
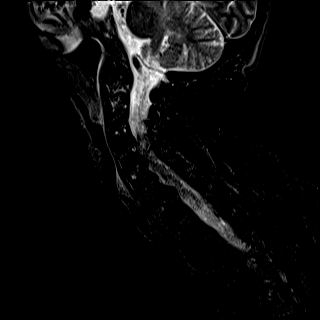
[im 15/15]
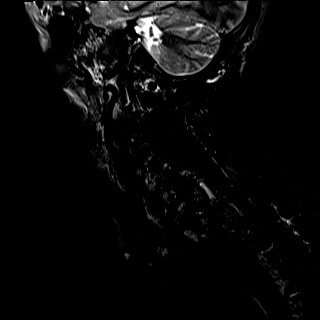

[Series 16: T2 · axial · 3.0mm · 0.70mm/px · z∈[-225,-130]mm · 8 of 29 slices shown (2 of 2)]
[im 1/29]
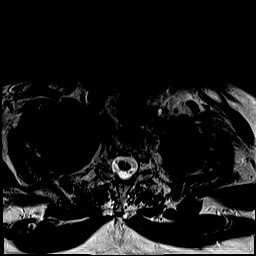
[im 5/29]
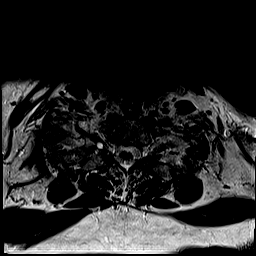
[im 9/29]
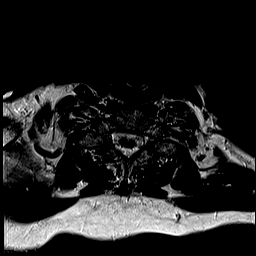
[im 13/29]
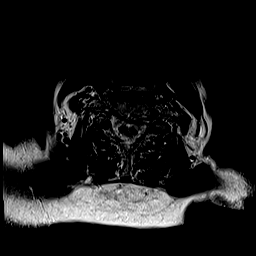
[im 17/29]
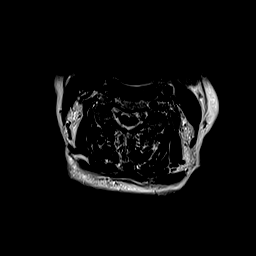
[im 21/29]
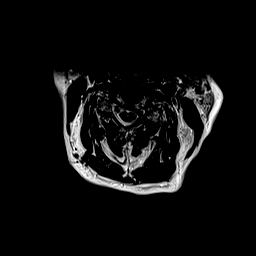
[im 25/29]
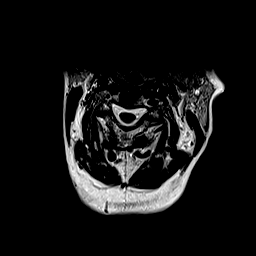
[im 29/29]
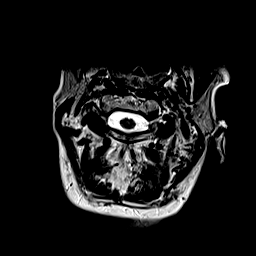

[Series 18: T1 · axial · non-contrast · 3.0mm · 0.35mm/px · z∈[-225,-130]mm · 8 of 29 slices shown]
[im 1/29]
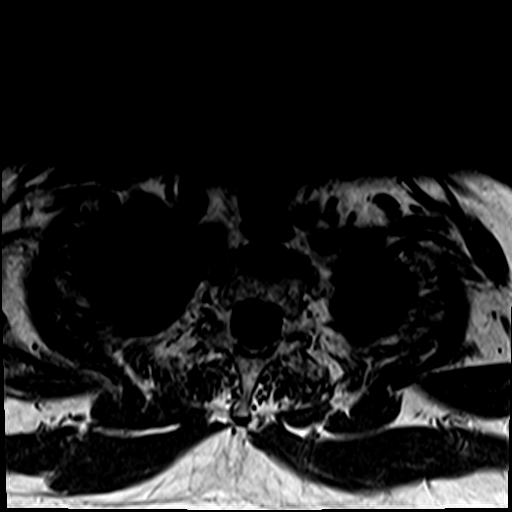
[im 5/29]
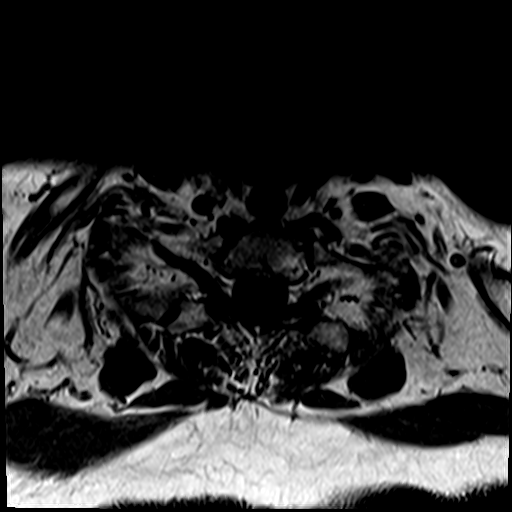
[im 9/29]
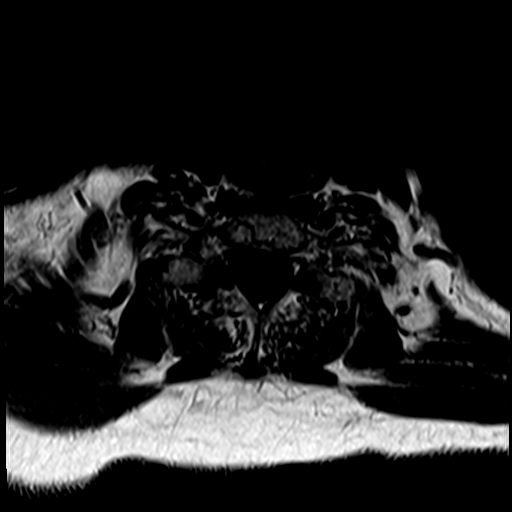
[im 13/29]
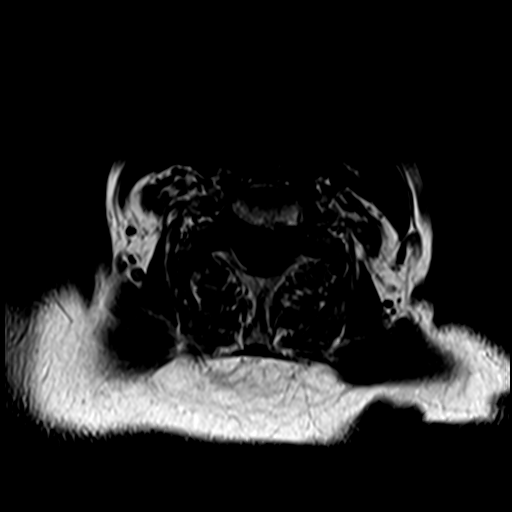
[im 17/29]
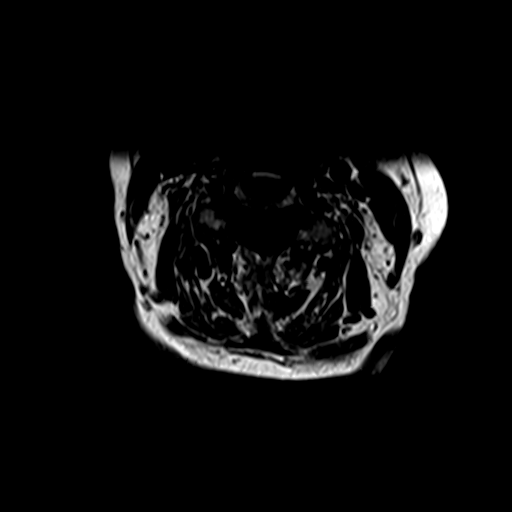
[im 21/29]
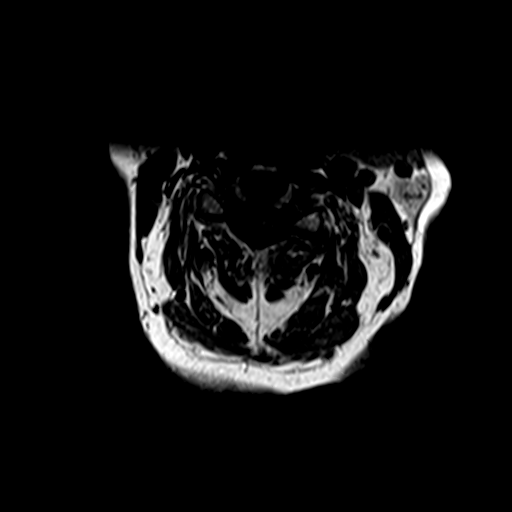
[im 25/29]
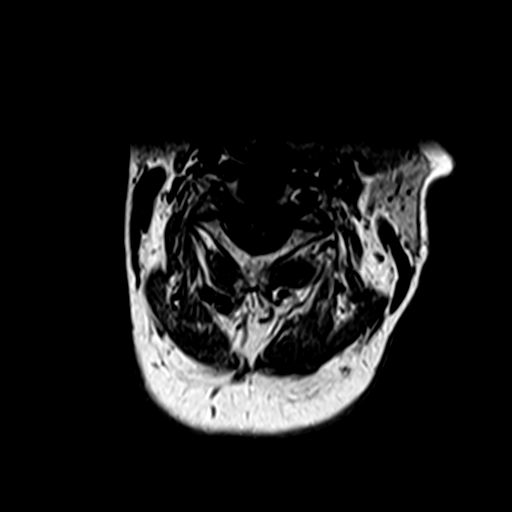
[im 29/29]
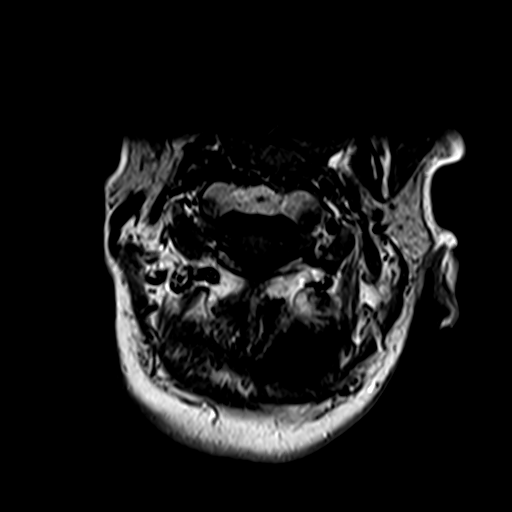

[Series 20: T1 post-contrast · axial · 3.0mm · 0.35mm/px · z∈[-225,-185]mm · 4 of 29 slices shown]
[im 1/29]
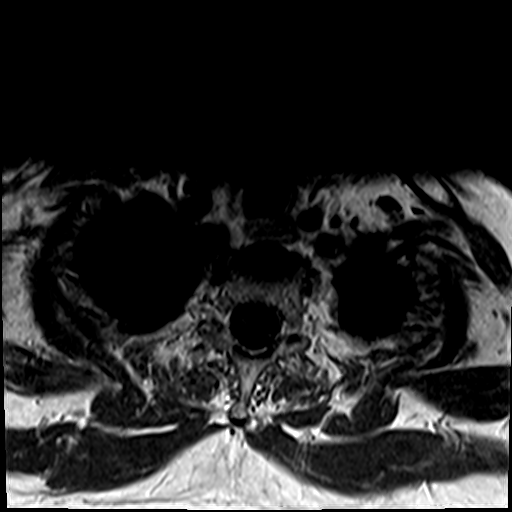
[im 5/29]
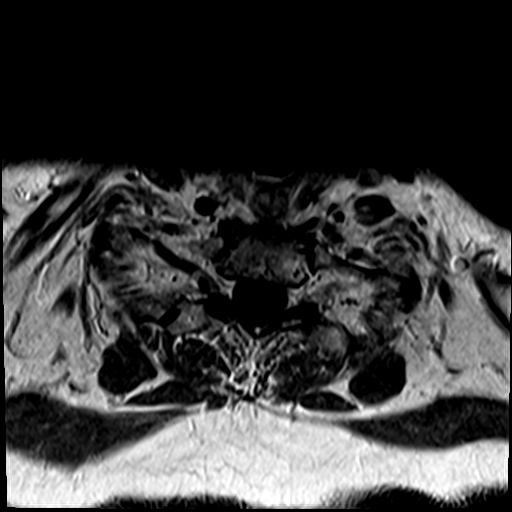
[im 9/29]
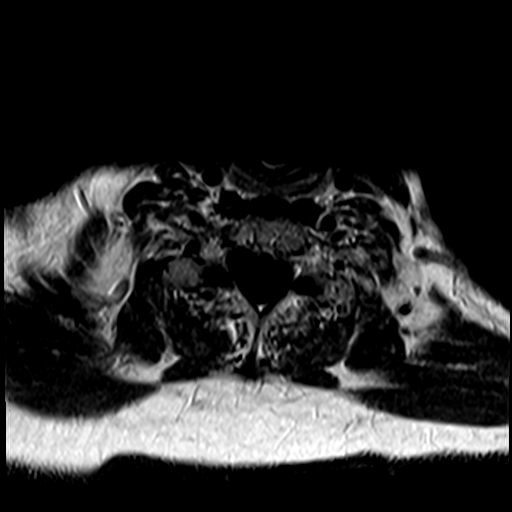
[im 13/29]
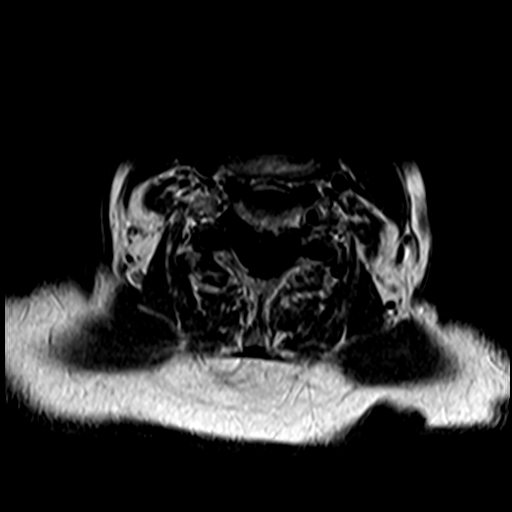

[28 of 48 positions shown; findings below may reference images not displayed]

FINDINGS: Alignment: Straightening of the normal cervical lordosis. Trace
anterolisthesis of C7 on T1.

Vertebrae: Vertebral body height maintained without acute or chronic
fracture. Bone marrow signal intensity within normal limits.
Probable atypical hemangioma noted within the clivus. No other
discrete or worrisome osseous lesions. No abnormal marrow edema or
enhancement.

Cord: Normal signal and morphology.  No abnormal enhancement.

Posterior Fossa, vertebral arteries, paraspinal tissues:
Craniocervical junction normal. Paraspinous soft tissues within
normal limits. Normal flow voids seen within the vertebral arteries
bilaterally.

Disc levels:

C2-C3: Mild disc bulge. Mild bilateral facet hypertrophy. No spinal
stenosis. Foramina remain patent.

C3-C4: Mild disc bulge with endplate and uncovertebral spurring.
Moderate left with mild right facet arthrosis. No spinal stenosis.
Moderate left with mild right C4 foraminal narrowing.

C4-C5: Disc bulge with endplate and uncovertebral spurring,
eccentric to the right. Flattening of the ventral thecal sac without
cord deformity. Bilateral facet hypertrophy with ligament flavum
thickening. No significant spinal stenosis. Mild to moderate right
with mild left C5 foraminal stenosis.

C5-C6: Mild intervertebral disc space narrowing with diffuse disc
bulge. Associated endplate and uncovertebral spurring. No spinal
stenosis. Mild to moderate right C6 foraminal narrowing. Left neural
foramina remains patent.

C6-C7: Left paracentral disc protrusion indents the left ventral
thecal sac (series 16, image 18). Secondary flattening of the left
hemi cord without cord signal changes. Thecal sac remains fairly
capacious without significant spinal stenosis. Foramina remain
patent.

C7-T1: Negative interspace. Mild facet hypertrophy. No significant
canal or foraminal stenosis.

Visualized upper thoracic spine demonstrates no significant finding.
IMPRESSION: 1. Normal MRI appearance of the cervical spinal cord. No abnormal
enhancement.
2. Degenerative spondylosis at C4-5 and C5-6 with resultant mild to
moderate right C5 and C6 foraminal stenosis.
3. Left paracentral disc protrusion at C6-7, flattening the left
hemi cord without cord signal changes.
4. Moderate left C4 foraminal narrowing related to uncovertebral and
facet disease.

## 2021-11-30 IMAGING — MR MR HEAD WO/W CM
14 series · 48 of 48 positions shown · IV contrast (gadavist)
Comparison: Prior MRI from [DATE].

CLINICAL DATA: Initial evaluation for acute TIA, neuro deficit,
encephalopathy. Right-sided weakness.

EXAM:
MRI HEAD WITHOUT AND WITH CONTRAST
TECHNIQUE: Multiplanar, multiecho pulse sequences of the brain and surrounding
structures were obtained without and with intravenous contrast.
CONTRAST:  6mL GADAVIST GADOBUTROL 1 MMOL/ML IV SOLN

[Series 5: ax dwi_tracew · axial · 3.0mm · 0.65mm/px · z∈[-78,+75]mm · 4 of 48 slices shown]
[im 1/48]
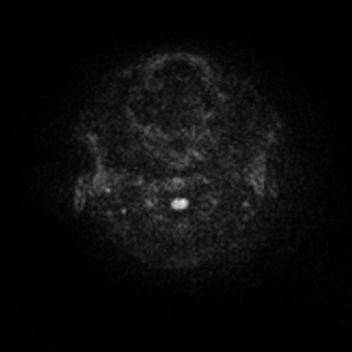
[im 16/48]
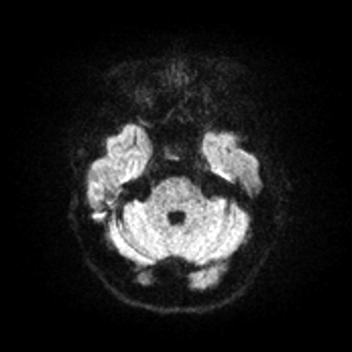
[im 32/48]
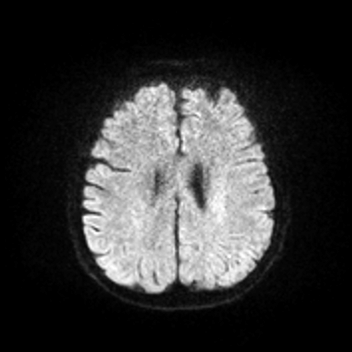
[im 48/48]
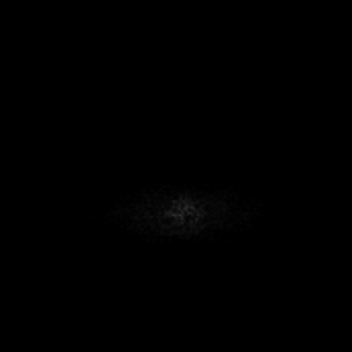

[Series 6: ax dwi_adc · axial · 3.0mm · 0.65mm/px · z∈[-78,+72]mm · 4 of 47 slices shown]
[im 1/47]
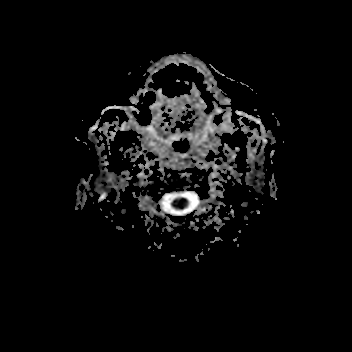
[im 16/47]
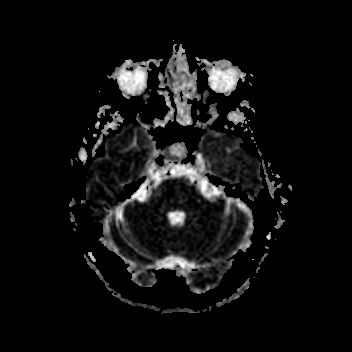
[im 31/47]
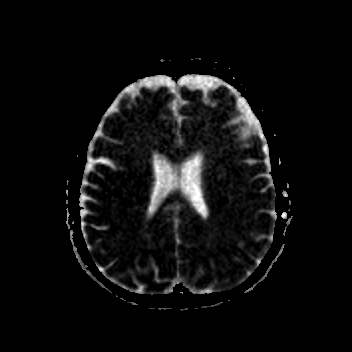
[im 47/47]
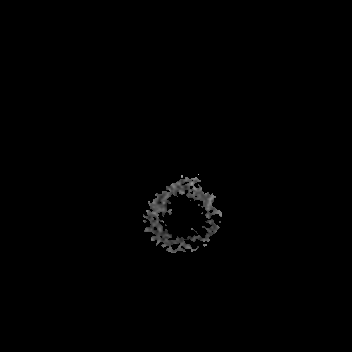

[Series 7: cor dwi_tracew · coronal · 5.0mm · 0.65mm/px · 2 of 40 slices shown]
[im 1/40]
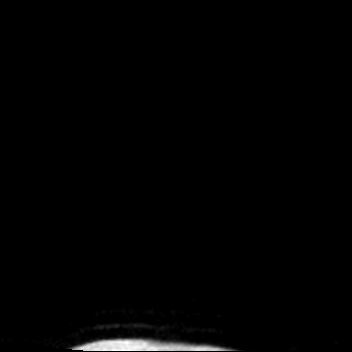
[im 40/40]
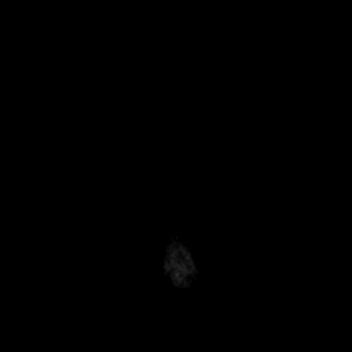

[Series 8: cor dwi_adc · coronal · 5.0mm · 0.65mm/px · 2 of 39 slices shown]
[im 1/39]
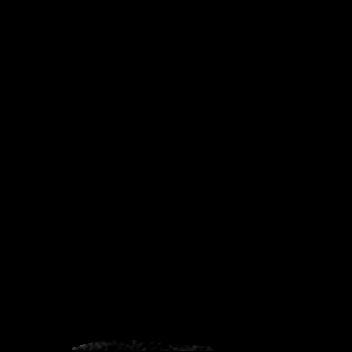
[im 39/39]
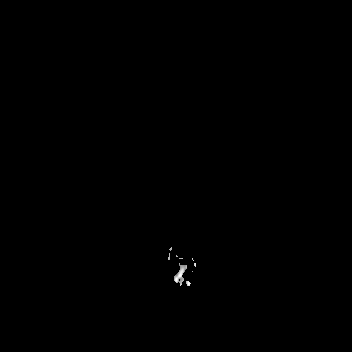

[Series 9: T1 · sagittal · 5.0mm · 0.62mm/px · 1 of 25 slices shown (1 of 2)]
[im 1/25]
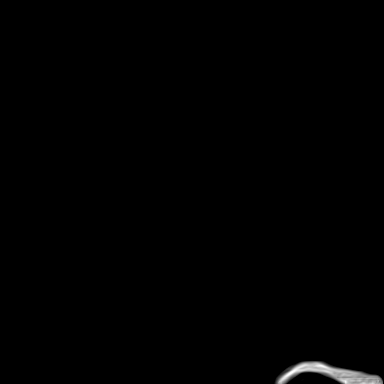

[Series 10: T2 · axial · 5.0mm · 0.53mm/px · 1 of 25 slices shown]
[im 1/25]
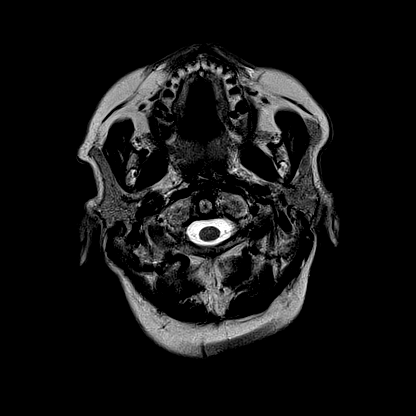

[Series 12: pha_images · axial · 3.0mm · 0.90mm/px · z∈[-88,+87]mm · 3 of 58 slices shown]
[im 1/58]
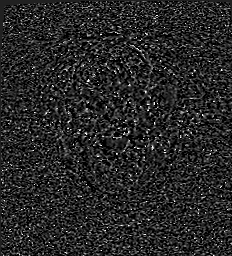
[im 29/58]
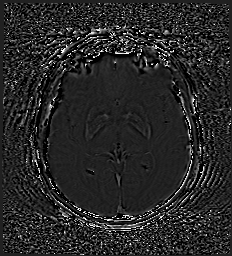
[im 58/58]
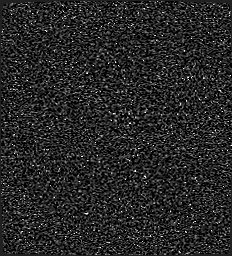

[Series 13: swi_images · axial · 3.0mm · 0.90mm/px · z∈[-88,+87]mm · 3 of 59 slices shown]
[im 1/59]
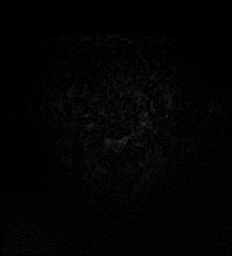
[im 30/59]
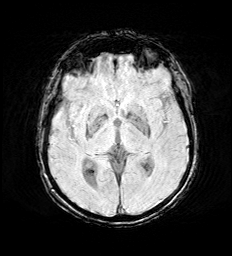
[im 59/59]
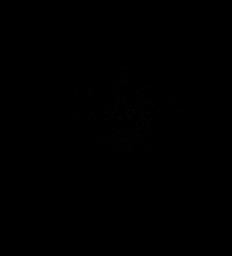

[Series 15: FLAIR · axial · 3.0mm · 0.53mm/px · z∈[-81,+79]mm · 3 of 55 slices shown]
[im 1/55]
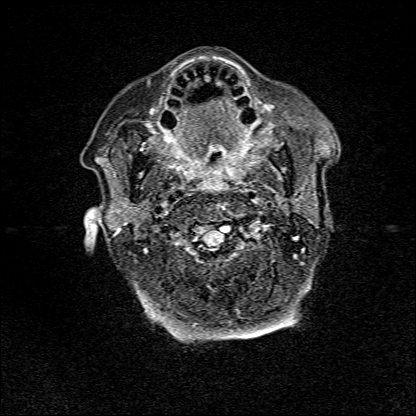
[im 28/55]
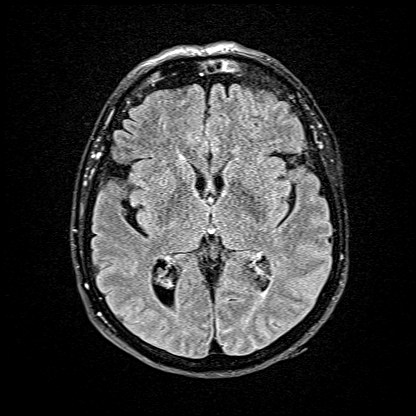
[im 55/55]
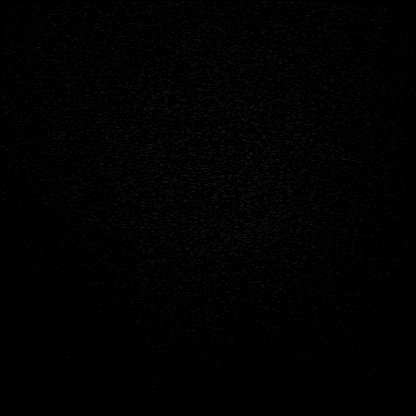

[Series 16: T1 · axial · 1.0mm · 0.98mm/px · z∈[-85,+88]mm · 10 of 176 slices shown (2 of 2)]
[im 1/176]
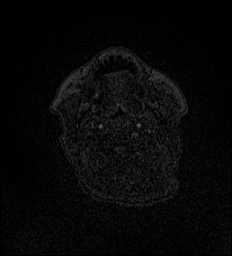
[im 20/176]
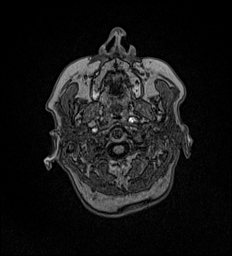
[im 39/176]
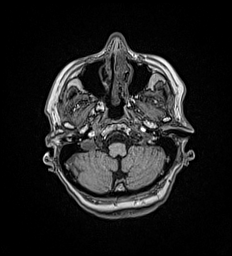
[im 59/176]
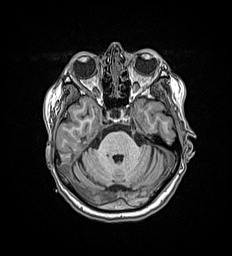
[im 78/176]
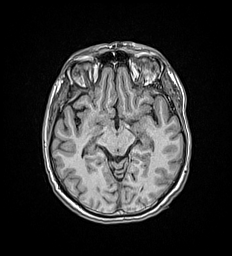
[im 98/176]
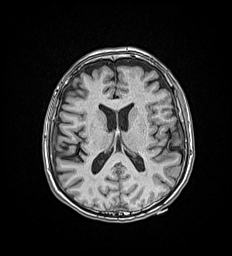
[im 117/176]
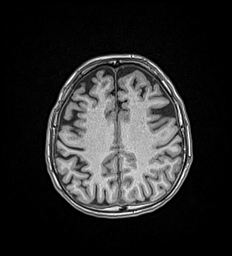
[im 137/176]
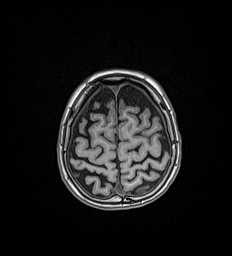
[im 156/176]
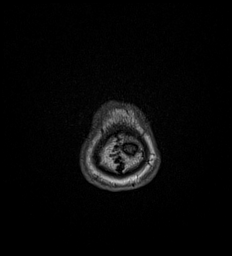
[im 176/176]
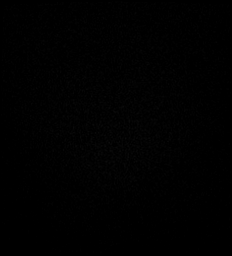

[Series 17: T2 post-contrast · coronal · 5.0mm · 0.57mm/px · 2 of 29 slices shown]
[im 1/29]
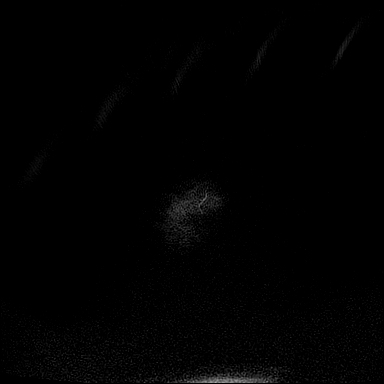
[im 29/29]
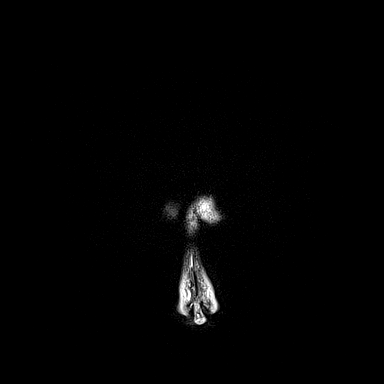

[Series 18: T1 post-contrast · axial · 1.0mm · 0.98mm/px · z∈[-85,+88]mm · 10 of 176 slices shown (1 of 3)]
[im 1/176]
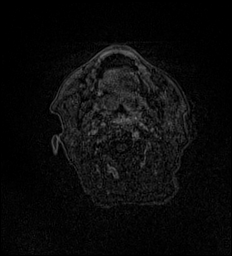
[im 20/176]
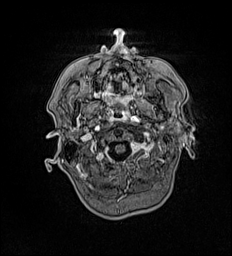
[im 39/176]
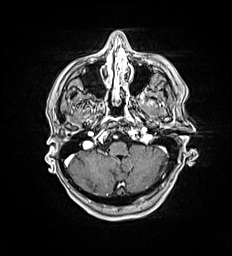
[im 59/176]
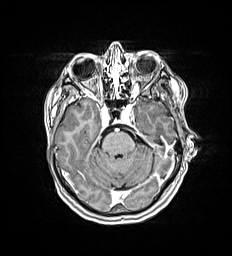
[im 78/176]
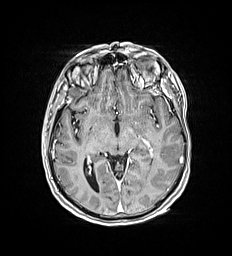
[im 98/176]
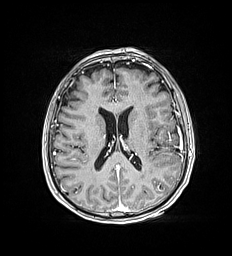
[im 117/176]
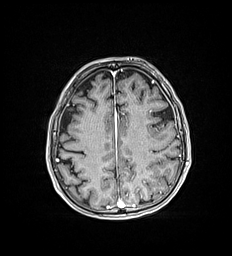
[im 137/176]
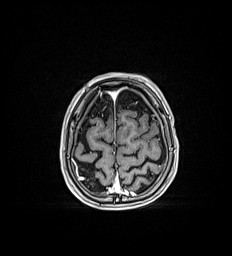
[im 156/176]
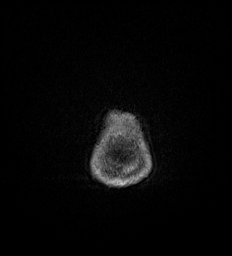
[im 176/176]
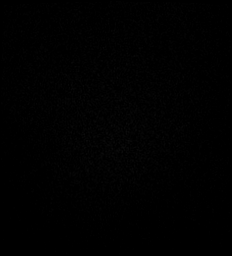

[Series 19: T1 post-contrast · coronal · 5.0mm · 0.57mm/px · 2 of 29 slices shown (2 of 3)]
[im 1/29]
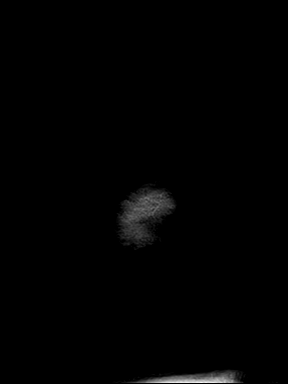
[im 29/29]
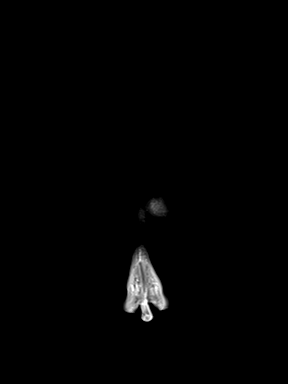

[Series 20: T1 post-contrast · sagittal · 5.0mm · 0.62mm/px · 1 of 25 slices shown (3 of 3)]
[im 1/25]
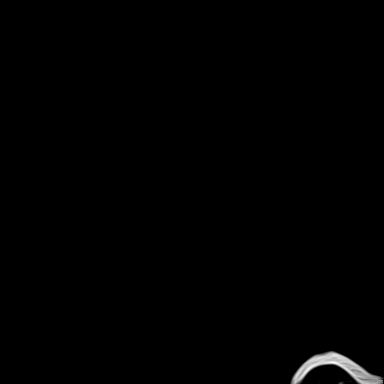

[48 of 48 positions shown; findings below may reference images not displayed]

FINDINGS: Brain: Cerebral volume within normal limits for age. Scattered
patchy T2/FLAIR hyperintensity involving the periventricular deep
white matter both cerebral hemispheres, most like related chronic
microvascular ischemic disease, mild to moderate in nature. Patchy
involvement of the pons noted.

No abnormal foci of restricted diffusion to suggest acute or
subacute ischemia or changes related to seizure. Gray-white matter
differentiation maintained. No encephalomalacia to suggest chronic
cortical infarction. No acute or chronic intracranial blood
products.

No mass lesion, midline shift or mass effect. No hydrocephalus or
extra-axial fluid collection. Partially empty sella noted.
Suprasellar region normal. Midline structures intact. No visible
intrinsic temporal lobe abnormality. No abnormal enhancement.

Vascular: Major intracranial vascular flow voids are maintained.

Skull and upper cervical spine: Craniocervical junction within
normal limits. Bone marrow signal intensity normal. No scalp soft
tissue abnormality.

Sinuses/Orbits: Globes and orbital soft tissues demonstrate no acute
finding. Mild scattered mucosal thickening noted within the
ethmoidal air cells. Paranasal sinuses are otherwise clear. No
mastoid effusion. Inner ear structures grossly normal.

Other: None.
IMPRESSION: 1. No acute intracranial abnormality.
2. Mild to moderate chronic microvascular ischemic disease.

## 2021-11-30 MED ORDER — TRAZODONE HCL 50 MG PO TABS
50.0000 mg | ORAL_TABLET | Freq: Every day | ORAL | Status: DC
  Administered 2021-12-01 – 2021-12-03 (×3): 50 mg via ORAL
  Filled 2021-11-30 (×3): qty 1

## 2021-11-30 MED ORDER — SODIUM CHLORIDE 0.9 % IV SOLN
2.0000 g | Freq: Four times a day (QID) | INTRAVENOUS | Status: DC
  Administered 2021-11-30 – 2021-12-02 (×6): 2 g via INTRAVENOUS
  Filled 2021-11-30: qty 2
  Filled 2021-11-30: qty 2000
  Filled 2021-11-30: qty 2
  Filled 2021-11-30 (×4): qty 2000
  Filled 2021-11-30: qty 2

## 2021-11-30 MED ORDER — ORAL CARE MOUTH RINSE
15.0000 mL | Freq: Two times a day (BID) | OROMUCOSAL | Status: DC
  Administered 2021-12-02 – 2021-12-07 (×11): 15 mL via OROMUCOSAL

## 2021-11-30 MED ORDER — CYANOCOBALAMIN 1000 MCG/ML IJ SOLN
1000.0000 ug | INTRAMUSCULAR | Status: DC
  Administered 2021-11-30 – 2021-12-07 (×4): 1000 ug via INTRAMUSCULAR
  Filled 2021-11-30 (×4): qty 1

## 2021-11-30 MED ORDER — SODIUM CHLORIDE 0.9 % IV SOLN
2.0000 g | INTRAVENOUS | Status: DC
  Filled 2021-11-30 (×2): qty 2000

## 2021-11-30 MED ORDER — VANCOMYCIN HCL 1500 MG/300ML IV SOLN
1500.0000 mg | Freq: Once | INTRAVENOUS | Status: AC
  Administered 2021-11-30: 1500 mg via INTRAVENOUS
  Filled 2021-11-30: qty 300

## 2021-11-30 MED ORDER — GADOBUTROL 1 MMOL/ML IV SOLN
6.0000 mL | Freq: Once | INTRAVENOUS | Status: AC | PRN
  Administered 2021-11-30: 6 mL via INTRAVENOUS

## 2021-11-30 MED ORDER — THIAMINE HCL 100 MG PO TABS
100.0000 mg | ORAL_TABLET | Freq: Every day | ORAL | Status: DC
  Administered 2021-11-30 – 2021-12-08 (×8): 100 mg via ORAL
  Filled 2021-11-30 (×9): qty 1

## 2021-11-30 MED ORDER — SODIUM CHLORIDE 0.9 % IV SOLN
2.0000 g | Freq: Two times a day (BID) | INTRAVENOUS | Status: DC
  Administered 2021-11-30 – 2021-12-02 (×4): 2 g via INTRAVENOUS
  Filled 2021-11-30 (×2): qty 2
  Filled 2021-11-30: qty 20
  Filled 2021-11-30: qty 2

## 2021-11-30 MED ORDER — DEXTROSE 5 % IV SOLN
10.0000 mg/kg | Freq: Two times a day (BID) | INTRAVENOUS | Status: DC
  Administered 2021-11-30 – 2021-12-04 (×6): 610 mg via INTRAVENOUS
  Filled 2021-11-30 (×8): qty 12.2

## 2021-11-30 MED ORDER — POTASSIUM CHLORIDE 10 MEQ/100ML IV SOLN
10.0000 meq | INTRAVENOUS | Status: AC
  Administered 2021-11-30 (×2): 10 meq via INTRAVENOUS
  Filled 2021-11-30: qty 100

## 2021-11-30 MED ORDER — SODIUM CHLORIDE 0.9 % IV SOLN
INTRAVENOUS | Status: DC
Start: 2021-11-30 — End: 2021-12-04

## 2021-11-30 NOTE — Progress Notes (Deleted)
NPO effective midnight for possible LP tomorrow ? ?Su Monks, MD ?[facility] ?[phone] ? ?If 7pm- 7am, please page neurology on call as listed in [place]. ? ?

## 2021-11-30 NOTE — Consult Note (Signed)
Pharmacy Antibiotic Note ? ?Teresa Sherman is a 76 y.o. female admitted on 11/29/2021 with meningitis. Pharmacy has been consulted for vancomycin and acyclovir dosing. ? ?Plan: ?Vancomycin 1500 mg IV loading dose, followed by 500 mg IV q12h  ?Goal trough 15-20 ? ?Acyclovir 10 mg/kg (610 mg) IV q12h  ? ?Pt also ordered ceftriaxone 2 g IV q12h and ampicillin 2g q4h  ? ?Monitor clinical picture, renal function, and vancomycin trough at steady state ?F/U C&S, abx deescalation / LOT ? ? ?Height: '4\' 11"'$  (149.9 cm) ?Weight: 61.2 kg (135 lb) ?IBW/kg (Calculated) : 43.2 ? ?Temp (24hrs), Avg:97.8 ?F (36.6 ?C), Min:97.8 ?F (36.6 ?C), Max:97.8 ?F (36.6 ?C) ? ?Recent Labs  ?Lab 11/29/21 ?1615 11/30/21 ?0906  ?WBC 9.0 16.0*  ?CREATININE 0.70 0.55  ?  ?Estimated Creatinine Clearance: 49.1 mL/min (by C-G formula based on SCr of 0.55 mg/dL).   ? ?Allergies  ?Allergen Reactions  ? Hydroxyzine   ?  Other reaction(s): Dizziness ?Patient reports severe dizziness and N/V after taking Hydroxyzine 25 mg. 24 hours later feels some better ?Patient reports severe dizziness and N/V after taking Hydroxyzine 25 mg. 24 hours later feels some better ?  ? Codeine Nausea And Vomiting  ?  migraine  ? Duloxetine Hcl Nausea And Vomiting  ? Monosodium Glutamate   ?  migraine  ? Nitrates, Organic   ?  migraine  ? Other   ?  *says can ONLY EAT ORGANIC THINGS ONLY. All other foods, non-organic products will make her sick  ? Phosphorus   ?  migraine  ? Sodium Benzoate [Nutritional Supplements]   ?  migraine  ? Sodium Phosphates   ?  migraine  ? Zithromax [Azithromycin]   ?  Migraine ?  ? ? ?Antimicrobials this admission: ?4/10 vancomycin >>  ?4/10 ceftriaxone >>  ?4/10 ampicillin >>  ?4/10 acyclovir >>  ? ?Dose adjustments this admission: ? ? ?Microbiology results: ?4/10 BCx: pending ?4/9 UCx: pending  ? ? ?Thank you for allowing pharmacy to be a part of this patient?s care. ? ?Darnelle Bos, PharmD  ?11/30/2021 4:26 PM ? ?

## 2021-11-30 NOTE — ED Notes (Signed)
RN to bedside to introduce self to to pt. Pt is more alert and more vocal than yesterday. She can say Hi and thank you.  ?
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
## 2021-11-30 NOTE — ED Notes (Signed)
New orders placed for blood cultures and urine cultures. These were both collected last night and sent to lab to hold.  ?
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
## 2021-11-30 NOTE — ED Notes (Signed)
RN to bedside to answer call bel. Pt was up out of bed with PT.  ?

## 2021-11-30 NOTE — Progress Notes (Signed)
OT Cancellation Note ? ?Patient Details ?Name: Teresa Sherman ?MRN: [id number] ?DOB: 13-Apr-1946 ? ? ?Cancelled Treatment:    Reason Eval/Treat Not Completed: Patient's level of consciousness. Pt largely non-responsive, non-verbal, and non-moving this morning, unable to participate in OT evaluation in any meaningful way. Will attempt at a later time/date, as pt becomes more alert. ? ?Josiah Lobo, PhD, MS, OTR/L ?11/30/21, 9:35 AM ?

## 2021-11-30 NOTE — Evaluation (Signed)
Physical Therapy Evaluation ?Patient Details ?Name: Teresa Sherman ?MRN: [id number] ?DOB: 11-28-1945 ?Today's Date: 11/30/2021 ? ?History of Present Illness ? [name] is a 76 year old female with history of stroke, hyperlipidemia, insomnia, who presents to the emergency department for chief concerns of altered mental status. ? ?  ?Clinical Impression ? Pt received in Semi-Fowler's position and agreeable to therapy.  Pt alert and oriented to self only upon arrival.  Pt with limited communication throughout session, but was able to respond to shortened commands when asked.  Pt able to transfer to seated EOB with modA from therapist using HHA.  Pt then performed MMT, with global weakness across the R side of the body, with increasing difficulty performing MMT due to potential communication deficits.  Pt did perform transfer to standing with minA from therapist for steadying the [place] and utilizing elevated ED bed.  Pt able to march in place with verbal and visual cuing.  Pt then ambulated in room and to the bathroom.  Pt voided and had bowel movement as well.  Pt given toilet paper and pt was able to wipe with non-dominant L UE, however therapist had to assist with cleaning pt completely.  Pt then transferred back to bed and experienced some R sided neglect when ambulating back to the bed.  Pt assisted by therapist to the EOB and sat with nursing in the room.  Pt then assisted to supine position awaiting transfer to room on the floor.  Current discharge plans to CIR are appropriate at this time, due to the nature of presentation, functional deficits, and potential with therapy.  Pt will continue to benefit from skilled therapy in order to address deficits listed below. ? ?   ? ?Recommendations for follow up therapy are one component of a multi-disciplinary discharge planning process, led by the attending physician.  Recommendations may be updated based on patient status, additional functional criteria and  insurance authorization. ? ?Follow Up Recommendations Acute inpatient rehab (3hours/day) ? ?  ?Assistance Recommended at Discharge Frequent or constant Supervision/Assistance  ?Patient can return home with the following ? A little help with walking and/or transfers;A little help with bathing/dressing/bathroom ? ?  ?Equipment Recommendations Other (comment) (TBD at next venue of care.)  ?Recommendations for Other Services ?    ?  ?Functional Status Assessment Patient has had a recent decline in their functional status and demonstrates the ability to make significant improvements in function in a reasonable and predictable amount of time.  ? ?  ?Precautions / Restrictions Precautions ?Precautions: Fall ?Restrictions ?Weight Bearing Restrictions: No  ? ?  ? ?Mobility ? Bed Mobility ?Overal bed mobility: Needs Assistance ?Bed Mobility: Supine to Sit ?  ?  ?Supine to sit: Mod assist ?  ?  ?General bed mobility comments: Pt requiring some HHA and significant verbal and tactile cuing in order to come upright into seated position. ?  ? ?Transfers ?Overall transfer level: Needs assistance ?Equipment used: Rolling walker (2 wheels) ?Transfers: Sit to/from Stand ?Sit to Stand: Min assist ?  ?  ?  ?  ?  ?General transfer comment: Pt with good technique coming upright into standing position with elevated ED bed. ?  ? ?Ambulation/Gait ?Ambulation/Gait assistance: Min assist ?Gait Distance (Feet): 50 Feet ?Assistive device: Rolling walker (2 wheels) ?Gait Pattern/deviations: Step-through pattern, Decreased stride length ?Gait velocity: decreased ?  ?  ?General Gait Details: Pt has difficulty with navigating the walker due to R UE deficit at this time. ? ?Stairs ?  ?  ?  ?  ?  ? ?  Wheelchair Mobility ?  ? ?Modified Rankin (Stroke Patients Only) ?  ? ?  ? ?Balance Overall balance assessment: Mild deficits observed, not formally tested ?  ?  ?  ?  ?  ?  ?  ?  ?  ?  ?  ?  ?  ?  ?  ?  ?  ?  ?   ? ? ? ?Pertinent Vitals/Pain Pain  Assessment ?Pain Assessment: No/denies pain  ? ? ?Home Living Family/patient expects to be discharged to:: Private residence ?Living Arrangements: Alone ?Available Help at Discharge: Family;Available PRN/intermittently ?Type of Home: House ?Home Access: Stairs to enter ?Entrance Stairs-Rails: Right ?Entrance Stairs-Number of Steps: 1 ?  ?Home Layout: One level ?Home Equipment: Shower seat;Grab bars - tub/shower;Cane - single point ?Additional Comments: daughter reports she was returning to baseline level following a stroke in October.  Pt was getting to the point where she was going to be tested to be able to drive.  ?  ?Prior Function Prior Level of Function : Independent/Modified Independent ?  ?  ?  ?  ?  ?  ?Mobility Comments: No AD utilized. ?  ?  ? ? ?Hand Dominance  ? Dominant Hand: Right ? ?  ?Extremity/Trunk Assessment  ? Upper Extremity Assessment ?Upper Extremity Assessment: Generalized weakness;RUE deficits/detail ?RUE Deficits / Details: Pt with difficulty utilizing the R UE, however is able to perform grip strength test, but has deficits when compared to the L UE. ?  ? ?Lower Extremity Assessment ?Lower Extremity Assessment: Generalized weakness;RLE deficits/detail ?RLE Deficits / Details: Pt with difficulty controlling the R LE when performing MMT and some cognitive deficit that prevents her from performing correctly. ?  ? ?   ?Communication  ? Communication: No difficulties  ?Cognition   ?  ?Overall Cognitive Status: Impaired/Different from baseline ?Area of Impairment: Orientation, Attention ?  ?  ?  ?  ?  ?  ?  ?  ?Orientation Level: Person ?  ?  ?  ?  ?  ?  ?General Comments: A&O x1 ?  ?  ? ?  ?General Comments   ? ?  ?Exercises    ? ?Assessment/Plan  ?  ?PT Assessment Patient needs continued PT services  ?PT Problem List Decreased strength;Decreased activity tolerance;Decreased balance;Decreased mobility;Decreased cognition;Decreased knowledge of use of DME;Decreased safety awareness ? ?   ?  ?PT  Treatment Interventions DME instruction;Gait training;Functional mobility training;Therapeutic activities;Therapeutic exercise;Balance training;Patient/family education   ? ?PT Goals (Current goals can be found in the Care Plan section)  ?Acute Rehab PT Goals ?PT Goal Formulation: Patient unable to participate in goal setting ?Time For Goal Achievement: 12/14/21 ? ?  ?Frequency 7X/week ?  ? ? ?Co-evaluation   ?  ?  ?  ?  ? ? ?  ?AM-PAC PT "6 Clicks" Mobility  ?Outcome Measure Help needed turning from your back to your side while in a flat bed without using bedrails?: A Lot ?Help needed moving from lying on your back to sitting on the side of a flat bed without using bedrails?: A Lot ?Help needed moving to and from a bed to a chair (including a wheelchair)?: A Lot ?Help needed standing up from a chair using your arms (e.g., wheelchair or bedside chair)?: A Lot ?Help needed to walk in hospital room?: A Lot ?Help needed climbing 3-5 steps with a railing? : A Lot ?6 Click Score: 12 ? ?  ?End of Session Equipment Utilized During Treatment: Gait belt ?Activity Tolerance: Patient tolerated  treatment well ?Patient left: in bed;with call bell/phone within reach;with nursing/sitter in room;with family/visitor present ?Nurse Communication: Mobility status ?PT Visit Diagnosis: Unsteadiness on feet (R26.81);Other abnormalities of gait and mobility (R26.89);Muscle weakness (generalized) (M62.81);Difficulty in walking, not elsewhere classified (R26.2);Hemiplegia and hemiparesis ?Hemiplegia - Right/Left: Right ?Hemiplegia - dominant/non-dominant: Dominant ?Hemiplegia - caused by: Unspecified ?  ? ?Time: 3335-4562 ?PT Time Calculation (min) (ACUTE ONLY): 57 min ? ? ?Charges:   PT Evaluation ?$PT Eval Moderate Complexity: 1 Mod ?PT Treatments ?$Gait Training: 8-22 mins ?$Therapeutic Activity: 23-37 mins ?  ?   ? ? ?Gwenlyn Saran, PT, DPT ?11/30/21, 2:51 PM ? ? ?Christie Nottingham ?11/30/2021, 2:42 PM ? ?

## 2021-11-30 NOTE — Progress Notes (Signed)
?PROGRESS NOTE ? ? ? ?Teresa Sherman  WUJ:[id number] DOB: 12/13/1945 DOA: 11/29/2021 ?PCP: System, Provider Not In  ? ?Assessment & Plan: ?  ?Principal Problem: ?  Altered mental status ?Active Problems: ?  Depression with anxiety ?  Disordered sleep ?  Hyperlipidemia ?  History of stroke ? ? ?Acute encephalopathy: w/ aphasia. Etiology unclear, possibly metabolic.  CT head & MRI brain shows no acute intracranial findings. Blood & urine cxs ordered. Ammonia, TSH are WNL. EEG ordered ?  ?Hx of CVA: continue on statin, aspirin  ?  ?HLD: continue on statin  ? ?Insomnia: continue on melatonin ? ?Leukocytosis: etiology unclear, likely reactive  ? ?Hypokalemia: KCl given  ? ? ? ?DVT prophylaxis: lovenox  ?Code Status: DNR ?Family Communication: discussed pt's care w/ pt's daughter, Teresa Sherman, and answered her questions ?Disposition Plan: depends on PT/OT recs  ? ?Level of care: Med-Surg ? ? ? ? ? ? ?Consultants:  ?Neuro  ? ?Procedures:  ? ?Antimicrobials: ? ?Subjective: ?Pt is only oriented to person.  ? ?Objective: ?Vitals:  ? 11/30/21 0700 11/30/21 0730 11/30/21 0800 11/30/21 1306  ?BP: 130/79 130/80 116/72 120/80  ?Pulse: 74 75 75 79  ?Resp: 19 (!) 21 (!) 22 16  ?Temp:    97.8 ?F (36.6 ?C)  ?TempSrc:    Oral  ?SpO2: 95% 94% 91% 99%  ?Weight:      ?Height:      ? ? ?Intake/Output Summary (Last 24 hours) at 11/30/2021 1511 ?Last data filed at 11/29/2021 1828 ?Gross per 24 hour  ?Intake --  ?Output 1000 ml  ?Net -1000 ml  ? ?Filed Weights  ? 11/29/21 1445  ?Weight: 61.2 kg  ? ? ?Examination: ? ?General exam: Appears calm and comfortable  ?Respiratory system: Clear to auscultation. Respiratory effort normal. ?Cardiovascular system: S1 & S2 +. No rubs, gallops or clicks.  ?Gastrointestinal system: Abdomen is nondistended, soft and nontender. Normal bowel sounds heard. ?Central nervous system: Alert and awake. Moves all extremities  ?Psychiatry: Judgement and insight appear poor. Flat mood and affect   ? ? ? ?Data Reviewed: I  have personally reviewed following labs and imaging studies ? ?CBC: ?Recent Labs  ?Lab 11/29/21 ?1615 11/30/21 ?0906  ?WBC 9.0 16.0*  ?NEUTROABS 5.7  --   ?HGB 14.3 13.8  ?HCT 42.4 40.4  ?MCV 87.8 87.6  ?PLT 215 201  ? ?Basic Metabolic Panel: ?Recent Labs  ?Lab 11/29/21 ?1615 11/30/21 ?0906  ?NA 132* 131*  ?K 4.1 3.4*  ?CL 98 101  ?CO2 24 20*  ?GLUCOSE 105* 86  ?BUN 20 18  ?CREATININE 0.70 0.55  ?CALCIUM 9.0 8.2*  ? ?GFR: ?Estimated Creatinine Clearance: 49.1 mL/min (by C-G formula based on SCr of 0.55 mg/dL). ?Liver Function Tests: ?Recent Labs  ?Lab 11/29/21 ?1615  ?AST 27  ?ALT 28  ?ALKPHOS 121  ?BILITOT 1.6*  ?PROT 7.8  ?ALBUMIN 3.8  ? ?No results for input(s): LIPASE, AMYLASE in the last 168 hours. ?Recent Labs  ?Lab 11/29/21 ?2039  ?AMMONIA 19  ? ?Coagulation Profile: ?Recent Labs  ?Lab 11/29/21 ?1444  ?INR 1.1  ? ?Cardiac Enzymes: ?No results for input(s): CKTOTAL, CKMB, CKMBINDEX, TROPONINI in the last 168 hours. ?BNP (last 3 results) ?No results for input(s): PROBNP in the last 8760 hours. ?HbA1C: ?Recent Labs  ?  11/30/21 ?0421  ?HGBA1C 5.4  ? ?CBG: ?No results for input(s): GLUCAP in the last 168 hours. ?Lipid Profile: ?Recent Labs  ?  11/30/21 ?0421  ?CHOL 120  ?HDL 52  ?[place]  59  ?TRIG 45  ?CHOLHDL 2.3  ? ?Thyroid Function Tests: ?Recent Labs  ?  11/29/21 ?1615  ?TSH 1.475  ? ?Anemia Panel: ?Recent Labs  ?  11/29/21 ?2039  ?VITAMINB12 274  ? ?Sepsis Labs: ?Recent Labs  ?Lab 11/29/21 ?1631  ?PROCALCITON <0.10  ? ? ?Recent Results (from the past 240 hour(s))  ?Resp Panel by RT-PCR (Flu A&B, Covid) Nasopharyngeal Swab     Status: None  ? Collection Time: 11/29/21  2:44 PM  ? Specimen: Nasopharyngeal Swab; Nasopharyngeal(NP) swabs in vial transport medium  ?Result Value Ref Range Status  ? SARS Coronavirus 2 by RT PCR NEGATIVE NEGATIVE Final  ?  Comment: (NOTE) ?SARS-CoV-2 target nucleic acids are NOT DETECTED. ? ?The SARS-CoV-2 RNA is generally detectable in upper respiratory ?specimens during the acute  phase of infection. The lowest ?concentration of SARS-CoV-2 viral copies this assay can detect is ?138 copies/mL. A negative result does not preclude SARS-Cov-2 ?infection and should not be used as the sole basis for treatment or ?other patient management decisions. A negative result may occur with  ?improper specimen collection/handling, submission of specimen other ?than nasopharyngeal swab, presence of viral mutation(s) within the ?areas targeted by this assay, and inadequate number of viral ?copies(<138 copies/mL). A negative result must be combined with ?clinical observations, patient history, and epidemiological ?information. The expected result is Negative. ? ?Fact Sheet for Patients:  ?EntrepreneurPulse.com.au ? ?Fact Sheet for Healthcare Providers:  ?IncredibleEmployment.be ? ?This test is no t yet approved or cleared by the [place] FDA and  ?has been authorized for detection and/or diagnosis of SARS-CoV-2 by ?FDA under an Emergency Use Authorization (EUA). This EUA will remain  ?in effect (meaning this test can be used) for the duration of the ?COVID-19 declaration under Section 564(b)(1) of the Act, 21 ?U.S.C.section 360bbb-3(b)(1), unless the authorization is terminated  ?or revoked sooner.  ? ? ?  ? Influenza A by PCR NEGATIVE NEGATIVE Final  ? Influenza B by PCR NEGATIVE NEGATIVE Final  ?  Comment: (NOTE) ?The Xpert Xpress SARS-CoV-2/FLU/RSV plus assay is intended as an aid ?in the diagnosis of influenza from Nasopharyngeal swab specimens and ?should not be used as a sole basis for treatment. Nasal washings and ?aspirates are unacceptable for Xpert Xpress SARS-CoV-2/FLU/RSV ?testing. ? ?Fact Sheet for Patients: ?EntrepreneurPulse.com.au ? ?Fact Sheet for Healthcare Providers: ?IncredibleEmployment.be ? ?This test is not yet approved or cleared by the [place] FDA and ?has been authorized for detection and/or diagnosis of  SARS-CoV-2 by ?FDA under an Emergency Use Authorization (EUA). This EUA will remain ?in effect (meaning this test can be used) for the duration of the ?COVID-19 declaration under Section 564(b)(1) of the Act, 21 U.S.C. ?section 360bbb-3(b)(1), unless the authorization is terminated or ?revoked. ? ?Performed at [facility], [place], ?[place] [address] ?  ?  ? ? ? ? ? ?Radiology Studies: ?CT ANGIO HEAD NECK W WO CM ? ?Result Date: 11/29/2021 ?CLINICAL DATA:  Neuro deficit, acute, stroke suspected. Abnormal behavior. EXAM: CT ANGIOGRAPHY HEAD AND NECK TECHNIQUE: Multidetector CT imaging of the head and neck was performed using the standard protocol during bolus administration of intravenous contrast. Multiplanar CT image reconstructions and MIPs were obtained to evaluate the vascular anatomy. Carotid stenosis measurements (when applicable) are obtained utilizing NASCET criteria, using the distal internal carotid diameter as the denominator. RADIATION DOSE REDUCTION: This exam was performed according to the departmental dose-optimization program which includes automated exposure control, adjustment of the mA and/or kV according  to patient size and/or use of iterative reconstruction technique. CONTRAST:  52m OMNIPAQUE IOHEXOL 350 MG/ML SOLN COMPARISON:  Head CT 10/22/2018 FINDINGS: CT HEAD FINDINGS Brain: Mild age related volume loss. No evidence of old or acute focal infarction, mass lesion, hemorrhage, hydrocephalus or extra-axial collection. Vascular: No abnormal vascular finding. Skull: Normal Sinuses: Clear Orbits: Normal Review of the MIP images confirms the above findings CTA NECK FINDINGS Aortic arch: Aortic arch is normal. Right carotid system: Common carotid artery is normal. Minimal calcified plaque at the bifurcation but no stenosis. Cervical ICA is normal. Left carotid system: Common carotid artery widely patent to the bifurcation. The bifurcation is normal. Cervical ICA is normal.  Vertebral arteries: Both vertebral arteries are patent at their origins and through the cervical region. The left vertebral artery is strongly dominant. Skeleton: Minimal spondylosis. Other neck: No m

## 2021-11-30 NOTE — Progress Notes (Signed)
Eeg done 

## 2021-11-30 NOTE — Progress Notes (Signed)
76 yo woman with mild MCI at baseline but high functioning presents with severe encephalopathy and R sided weakness after being found wandering around naked in her yard by a Customer service manager. MRI brain NAICP. Her R sided weakness has improved based on prior charted exams and now only involves her RUE, which remains profoundly weak. CBC 9 on admission yesterday now up to 16 w/ no source identified. She remains encephalopathic. Ddx for encephalopathy includes infection (CNS or bacteremia, other), seizure, other intracranial process not identified on noncontrast MRI.  ? ?Recommendations: ? ?# Encephalopathy ?- MRI brain wwo contrast ?- rEEG ?- Empiric coverage for CNS infection: vancomycin, ceftriaxone, ampicillin, acyclovir ?- NPO after MN for IR LP tomorrow if MRI/EEG do not identify etiology ?- F/u blood cultures ? ?# RUE weakness ?- MRI c spine wwo contrast ?- MRI brain wwo contrast ? ?Full consult note to follow. ? ?Su Monks, MD ?[facility] ?[phone] ? ?If 7pm- 7am, please page neurology on call as listed in [place]. ? ?

## 2021-12-01 ENCOUNTER — Inpatient Hospital Stay: Payer: Medicare HMO

## 2021-12-01 DIAGNOSIS — R519 Headache, unspecified: Secondary | ICD-10-CM

## 2021-12-01 DIAGNOSIS — E876 Hypokalemia: Secondary | ICD-10-CM | POA: Diagnosis not present

## 2021-12-01 DIAGNOSIS — G4709 Other insomnia: Secondary | ICD-10-CM

## 2021-12-01 DIAGNOSIS — R4182 Altered mental status, unspecified: Secondary | ICD-10-CM | POA: Diagnosis not present

## 2021-12-01 DIAGNOSIS — D72829 Elevated white blood cell count, unspecified: Secondary | ICD-10-CM

## 2021-12-01 DIAGNOSIS — R4701 Aphasia: Secondary | ICD-10-CM | POA: Diagnosis not present

## 2021-12-01 DIAGNOSIS — R29898 Other symptoms and signs involving the musculoskeletal system: Secondary | ICD-10-CM | POA: Diagnosis not present

## 2021-12-01 LAB — CSF CELL COUNT WITH DIFFERENTIAL
Eosinophils, CSF: 0 %
Lymphs, CSF: 89 %
Monocyte-Macrophage-Spinal Fluid: 11 %
RBC Count, CSF: 3 /mm3 (ref 0–3)
Segmented Neutrophils-CSF: 0 %
Tube #: 3
WBC, CSF: 3 /mm3 (ref 0–5)

## 2021-12-01 LAB — BASIC METABOLIC PANEL
Anion gap: 8 (ref 5–15)
BUN: 13 mg/dL (ref 8–23)
CO2: 23 mmol/L (ref 22–32)
Calcium: 7.9 mg/dL — ABNORMAL LOW (ref 8.9–10.3)
Chloride: 105 mmol/L (ref 98–111)
Creatinine, Ser: 0.49 mg/dL (ref 0.44–1.00)
GFR, Estimated: >60 mL/min (ref >60)
Glucose, Bld: 95 mg/dL (ref 70–99)
Potassium: 3.3 mmol/L — ABNORMAL LOW (ref 3.5–5.1)
Sodium: 136 mmol/L (ref 135–145)

## 2021-12-01 LAB — CBC
HCT: 37.4 % (ref 36.0–46.0)
Hemoglobin: 12.8 g/dL (ref 12.0–15.0)
MCH: 30.2 pg (ref 26.0–34.0)
MCHC: 34.2 g/dL (ref 30.0–36.0)
MCV: 88.2 fL (ref 80.0–100.0)
Platelets: 175 10*3/uL (ref 150–400)
RBC: 4.24 MIL/uL (ref 3.87–5.11)
RDW: 14.6 % (ref 11.5–15.5)
WBC: 7.7 10*3/uL (ref 4.0–10.5)
nRBC: 0 % (ref 0.0–0.2)

## 2021-12-01 LAB — GLUCOSE, CSF: Glucose, CSF: 67 mg/dL (ref 40–70)

## 2021-12-01 LAB — PROTEIN, CSF: Total  Protein, CSF: 43 mg/dL (ref 15–45)

## 2021-12-01 IMAGING — RF DG SPINAL PUNCT LUMBAR DIAG WITH FL CT GUIDANCE
2 series · 3 of 3 positions shown · non-contrast
Comparison: MR brain with and without contrast performed [DATE]
reviewed prior to the procedure, MR lumbar spine with and without
contrast performed [DATE] reviewed prior to the procedure.

CLINICAL DATA: Patient with encephalopathy, request received for
diagnostic lumbar puncture.

EXAM:
DIAGNOSTIC LUMBAR PUNCTURE UNDER FLUOROSCOPIC GUIDANCE

[Series 1: fluoro_iodine 2fps_bw · 0.20mm/px · 1 of 1 slices shown]
[im 1/1]
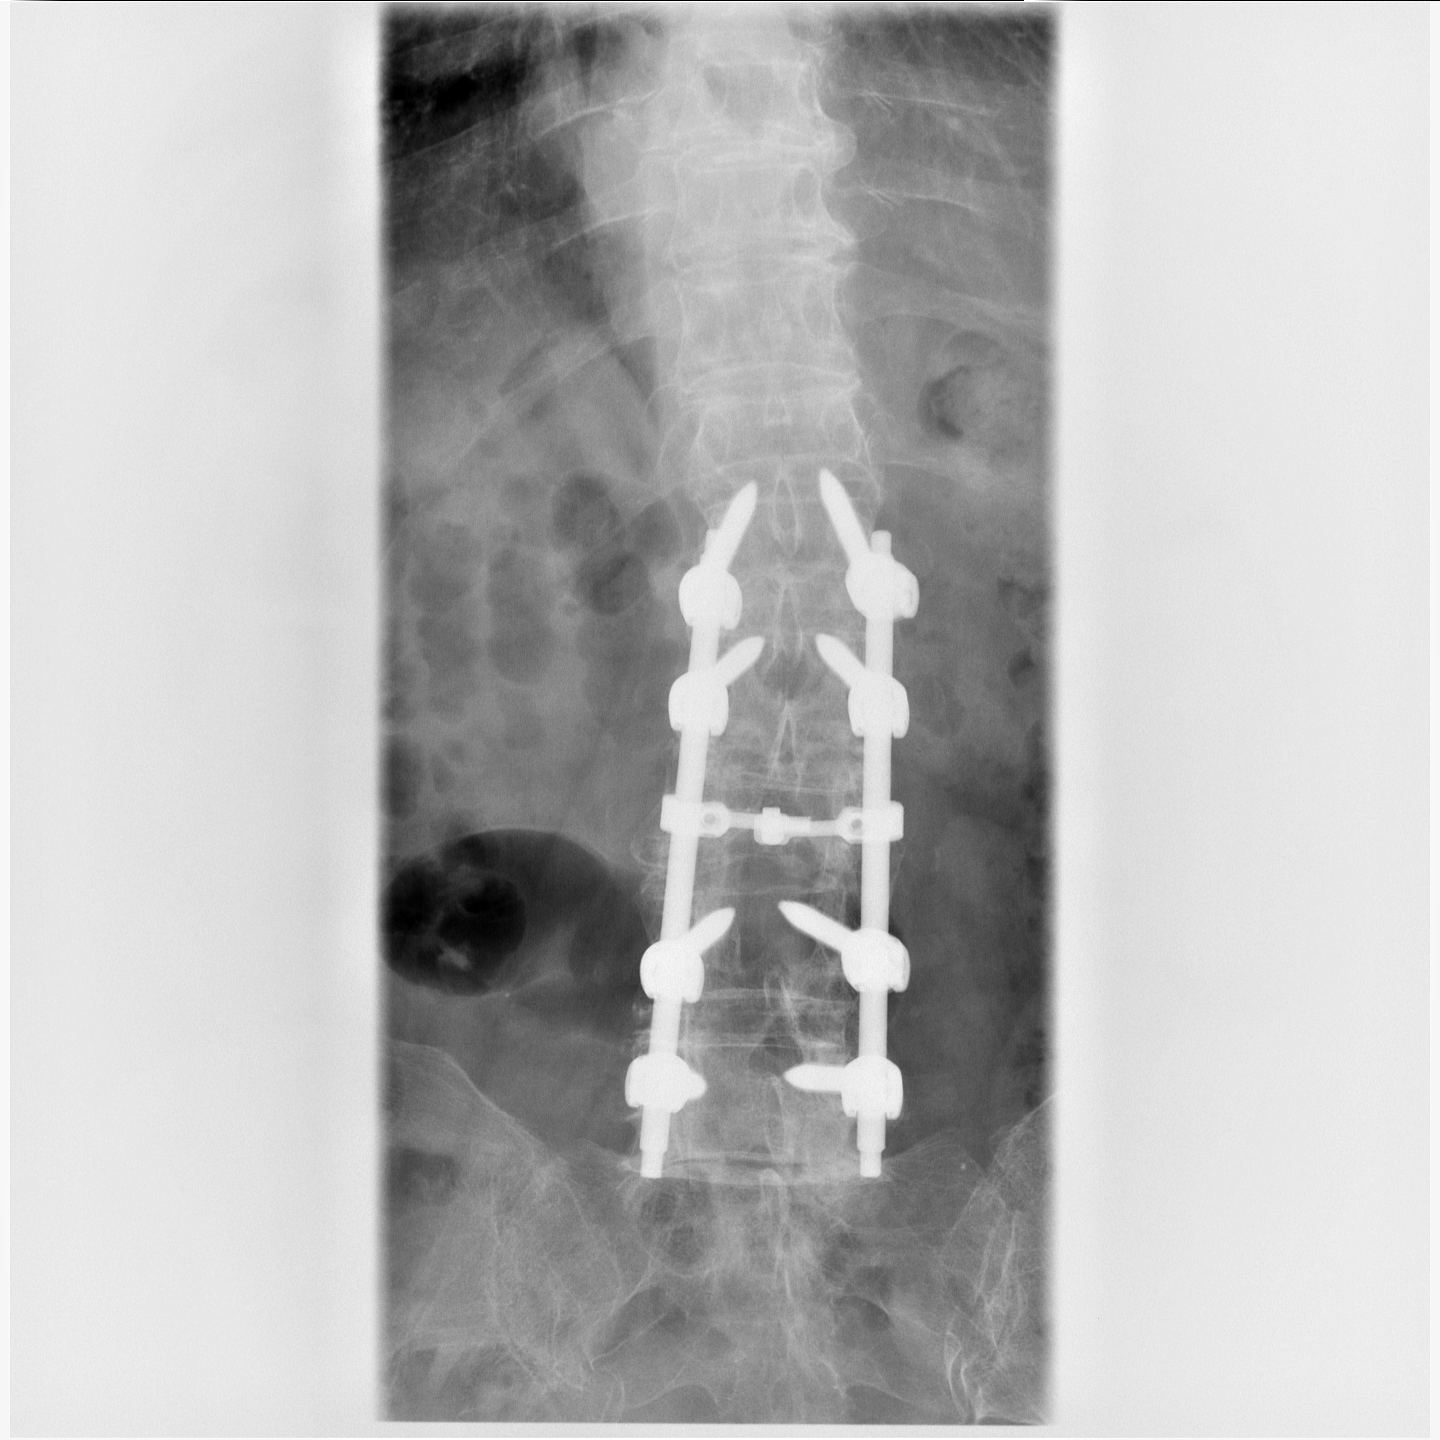

[Series 2: cp_standard · 0.19mm/px · 2 of 2 frames shown]
[frame 1/2]
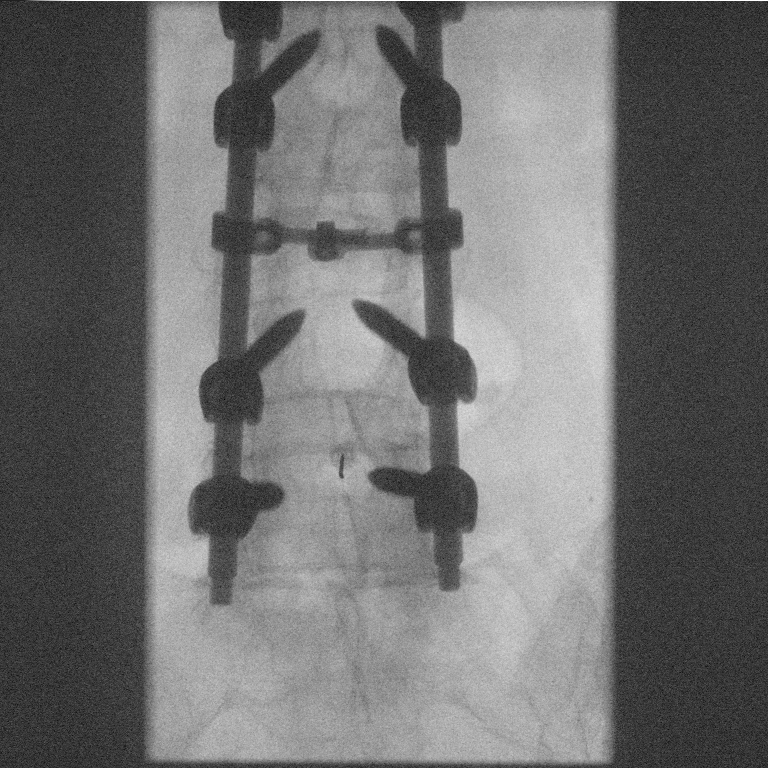
[frame 2/2]
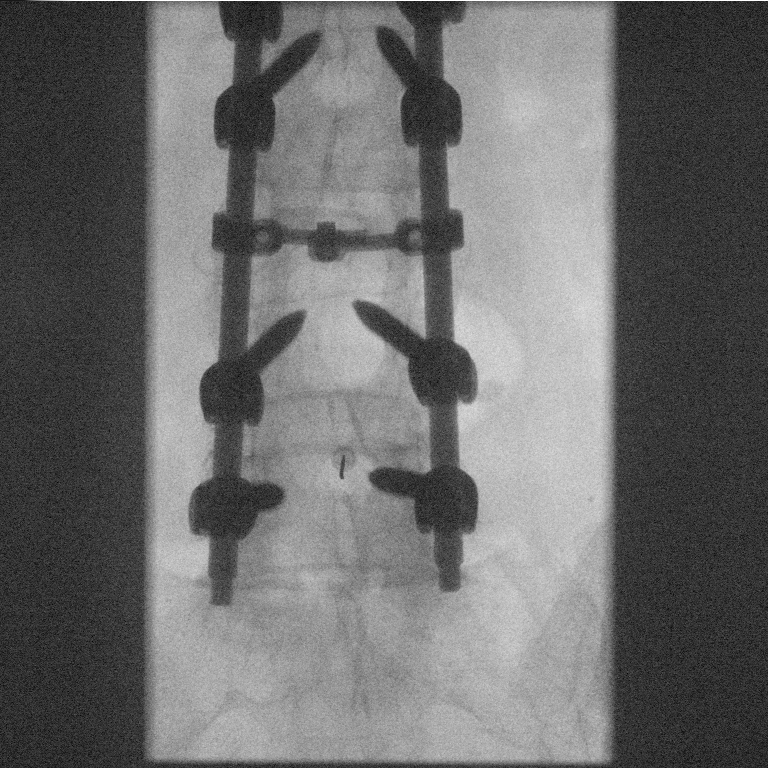

[3 of 3 positions shown; findings below may reference images not displayed]

FLUOROSCOPY TIME:  Radiation Exposure Index (if provided by the
fluoroscopic device): 2.20 mGy

PROCEDURE:
Informed consent was obtained from the patient's LIENAD who
is her daughter prior to the procedure, including potential
complications of bleeding, infection, paresthesias, nerve damage,
CSF leak requiring additional procedures, post procedure requirement
to lay flat for several hours after the procedure, headache,
allergy, and pain. With the patient prone, the lower back was
prepped with Betadine. 1% Lidocaine was used for local anesthesia.
Lumbar puncture was performed at the L4-L5 level using a 20 gauge
needle with return of colorless CSF with an opening pressure of 15
cm water. 13 ml of CSF were obtained for laboratory studies. The
inner stylet was placed back into the needle and the needle was
removed in its entirety. The patient tolerated the procedure well
and there were no apparent complications. A sterile bandage was
applied.
IMPRESSION: Technically successful fluoroscopic guided lumbar puncture.

This exam was performed by LIENAD, and was supervised
and interpreted by Dr. LIENAD.

## 2021-12-01 MED ORDER — POTASSIUM CHLORIDE 10 MEQ/100ML IV SOLN
10.0000 meq | INTRAVENOUS | Status: AC
  Administered 2021-12-01 (×2): 10 meq via INTRAVENOUS
  Filled 2021-12-01 (×2): qty 100

## 2021-12-01 MED ORDER — LIDOCAINE HCL 1 % IJ SOLN
5.0000 mL | Freq: Once | INTRAMUSCULAR | Status: AC
  Administered 2021-12-01: 5 mL
  Filled 2021-12-01: qty 5

## 2021-12-01 MED ORDER — VANCOMYCIN HCL 500 MG/100ML IV SOLN
500.0000 mg | Freq: Two times a day (BID) | INTRAVENOUS | Status: DC
  Administered 2021-12-01 – 2021-12-02 (×2): 500 mg via INTRAVENOUS
  Filled 2021-12-01 (×3): qty 100

## 2021-12-01 NOTE — Progress Notes (Signed)
Neurology progress note ? ?S: Patient started on empiric tx for meningitis yesterday with vanc/ceftriaxone/ampicillin + acyclovir. Today she is still altered but more engaged, and is oriented to hospital and daughter at bedside. Aphasia improved but still moderate. Able to follow simple commands today. No new neurologic complaints. RUE weakness has improved significantly and she is able to lift that hand almost above her head.  ? ?Data: ? ?MRI brain wwo: mild CSVID, otherwise normal for age ? ?MRI c spine wwo: ? ?1. Normal MRI appearance of the cervical spinal cord. No abnormal ?enhancement. ?2. Degenerative spondylosis at C4-5 and C5-6 with resultant mild to ?moderate right C5 and C6 foraminal stenosis. ?3. Left paracentral disc protrusion at C6-7, flattening the left ?hemi cord without cord signal changes. ?4. Moderate left C4 foraminal narrowing related to uncovertebral and ?facet disease. ? ?CNS imaging personally reviewed; I agree with above interpretations ? ?CTA H&N: WNL ? ?rEEG: abnormal due to intermittent left frontotemporal slowing, at times in the delta range, indicating focal dysfunction or potential underlying structural abnormality in this area ? ?O: ? ?Vitals:  ? 12/01/21 0519 12/01/21 0948  ?BP: 109/76 123/88  ?Pulse: 74 84  ?Resp: 19 16  ?Temp: 97.7 ?F (36.5 ?C) (!) 97.5 ?F (36.4 ?C)  ?SpO2: 96% 97%  ? ?Gen: awake, alert, NAD ?CV: RRR ?Resp: normal WOB ? ?Neurologic exam ?MS: oriented to self, hospital, and daughter at bedside, able to follow some simple commands today ?Speech: sparse, delayed, mild dysarthria, able to answer some questions appropriately with 1-2 word answers ?*CN: PERRL, blinks to threat bilat, optic discs unable to be visualized 2/2 pupillary constriction, EOMI w/o nystagmus, no ptosis, sensation intact, face symmetric at rest, hearing intact to voice ?*Motor: 4+/5 LUE, BLE with subtle drift in all 3 extremities. 3/5 RUE able to briefly lift hand almost above her head ?*Sensory:  SILT ?*Coordination:  UTA ?*Reflexes:  2+ and symmetric throughout without clonus; toes equiv bilat ?*Gait: deferred ? ?A/P: This is a 76 year old woman with past medical history significant for depression, anxiety, hyperlipidemia, previous tiny acute infarct in the right PCA distribution with no residual deficits who is admitted for encephalopathy. She has had one similar but much less severe episode in the past that eventually resolved without etiology identified. MRI brain wwo and MRI c spine wwo unrevealing as to etiologies for her encephalopathy and RUE weakness, respectively. Mental status, aphasia, and RUE weakness all somewhat improved. Leukocytosis resolved after starting empiric tx for possible CNS infection. It is interesting that the focal slowing on her EEG localizes to the left frontotemporal region which controls both speech and right motor function although there is no apparent structural abnormality in that region on MRI. Given her persistent encephalopathy, leukocytosis, and headache with unknown cause will proceed with LP this afternoon. ? ?- IR guided LP this afternoon (I ordered): cell count in 2 tubes, protein, glucose, culture, OCB, HSV PCR, autoimmune encephalopathy panel ENC2 send out to [facility]. If enough CSF leftover please send to [facility] for comprehensive meningitis/encephalitis PCR panel (orders are in for last 3 tests) ?- NPO until LP completed ?- Patient should lay flat for 1-2 hrs after LP completed ?- PT/OT/SLP ?- Will continue to follow. ? ?I had extensive conversation with patient and daughter at bedside, and son Peter Garter [phone] about the test results and plan for further workup including lumbar puncture. All are amenable. ? ?Su Monks, MD ?[facility] ?[phone] ? ?If 7pm- 7am, please page neurology on call as  listed in [place]. ? ? ?Will continue encephalopathy evaluation with MRI brain wwo contrast seizure protocol (first MRI was noncon only) and  add MRI c spine wwo since her weakness is really only relegated to her RUE. Given extreme altered mentation, headache, and leukocytosis, will start empiric tx for CNS infection w/ vanc, ceftriaxone, ampicillin, and acyclovir. Plan for LP tmrw if imaging and EEG tonight are unrevealing. ? ? ?

## 2021-12-01 NOTE — Care Management (Signed)
[facility] is assessing patient for eligibility for admission. ?

## 2021-12-01 NOTE — Progress Notes (Signed)
Inpatient Rehab Admissions Coordinator:  ? ?Per therapy recommendations,  patient was screened for CIR candidacy by Clemens Catholic, MS, CCC-SLP  At this time, workup is ongoing and it not clear if Pt. Has medical necessity for CIR. O  will not pursue a rehab consult for this Pt. at this time, but CIR admissions team will follow rescreen when workup is complete.  Please contact me with any questions.  ? ? ?Clemens Catholic, MS, CCC-SLP ?Rehab Admissions Coordinator  ?[phone] (celll) ?[phone] (office) ? ? ?

## 2021-12-01 NOTE — Progress Notes (Signed)
?PROGRESS NOTE ? ? ?HPI was taken from Dr. Tobie Poet: ?Teresa Sherman is a 76 year old female with history of stroke, hyperlipidemia, insomnia, who presents to the emergency department for chief concerns of altered mental status. ? ?Initial vitals in the emergency department showed temperature of 98, respiration rate of 25, blood pressure 168/118, SPO2 of 98% on room air. ? ?Serum sodium 132, potassium 4.1, chloride 98, bicarb 24, BUN of 20, serum creatinine of 0.70, GFR greater than 60, nonfasting blood glucose 105, WBC 9, hemoglobin 14.3, platelets of 215. ? ?COVID/influenza A/influenza B PCR were negative. ?  ?T. bili mildly elevated at 1.6. ? ?TSH was within normal limits. ?  ?CT of the head and neck with contrast in the ED was read as: Normal head CT for age.  Normal CT angiography of the head and neck. ?  ?Portable chest x-ray: Bibasilar platelike opacities, likely atelectasis.  Differential considerations include infection and aspiration.  Age-indeterminate compression fracture deformity of the mild thoracic vertebral body. ?  ?EDP treatment: Sodium chloride 1 L bolus. ?  ?At bedside she is aphasic. She opens her eyes and closes it spontaneously. She does smile when I asked her to open her mouth. She can weakly move her fingers and her legs in the left side. She was not able to move her right upper or lower extremities. ?  ?Stroke in October 2022 and got back to normal ? ? ?Teresa Sherman  OMV:[id number] DOB: 1946/02/10 DOA: 11/29/2021 ?PCP: System, Provider Not In  ? ?Assessment & Plan: ?  ?Principal Problem: ?  Altered mental status ?Active Problems: ?  Depression with anxiety ?  Disordered sleep ?  Hyperlipidemia ?  History of stroke ? ? ?Acute encephalopathy: w/ aphasia. Mental status slightly improved today. Etiology unclear, possible meningitis vs encephalitis vs other CNS infection. Repeat MRI brain shows no acute intracranial abnormalities. EEG shows intermittent left frontotemporal slowing, at  time in the delta range indicating focal dysfunction or potential underlying structural abnormality in the area as per neuro. Blood cxs NGTD. Urine cx is pending. NPO. LP this afternoon as per neuro. Neuro following and recs apprec  ?  ?Hx of CVA: continue on statin, aspirin  ?  ?HLD: continue on statin  ? ?Insomnia: continue on melatonin  ? ?Leukocytosis: resolved  ? ?Hypokalemia: potassium given. Will check Mg level tomorrow AM  ? ? ? ?DVT prophylaxis: lovenox  ?Code Status: DNR ?Family Communication: discussed pt's care w/ pt's daughter, Teresa Sherman, and answered her questions ?Disposition Plan: depends on PT/OT recs  ? ?Level of care: Med-Surg ? ?Status is: Inpatient ?Remains inpatient appropriate because: going for LP today ? ? ? ? ?Consultants:  ?Neuro  ? ?Procedures:  ? ?Antimicrobials: ? ?Subjective: ?Pt is still confused today but slightly improved from day prior  ? ?Objective: ?Vitals:  ? 11/30/21 1306 11/30/21 1944 12/01/21 0026 12/01/21 0519  ?BP: 120/80 106/68 110/66 109/76  ?Pulse: 79 85 78 74  ?Resp: '16 20 20 19  '$ ?Temp: 97.8 ?F (36.6 ?C) 98.2 ?F (36.8 ?C) 98.3 ?F (36.8 ?C) 97.7 ?F (36.5 ?C)  ?TempSrc: Oral Oral Oral Oral  ?SpO2: 99% 97%  96%  ?Weight:      ?Height:      ? ? ?Intake/Output Summary (Last 24 hours) at 12/01/2021 0642 ?Last data filed at 12/01/2021 0300 ?Gross per 24 hour  ?Intake 1545.07 ml  ?Output --  ?Net 1545.07 ml  ? ?Filed Weights  ? 11/29/21 1445  ?Weight: 61.2 kg  ? ? ?Examination: ? ?  General exam: Appears comfortable but still confused  ?Respiratory system: clear breath sounds b/l ?Cardiovascular system: S1/S2+. No gallops or rubs  ?Gastrointestinal system: Abd is soft, NT, ND & hypoactive bowel  ?Central nervous system: Alert and awake. Moves all extremities   ?Psychiatry: judgement and insight appear poor. Flat mood and affect  ? ? ? ?Data Reviewed: I have personally reviewed following labs and imaging studies ? ?CBC: ?Recent Labs  ?Lab 11/29/21 ?1615 11/30/21 ?0906 12/01/21 ?8657   ?WBC 9.0 16.0* 7.7  ?NEUTROABS 5.7  --   --   ?HGB 14.3 13.8 12.8  ?HCT 42.4 40.4 37.4  ?MCV 87.8 87.6 88.2  ?PLT 215 201 175  ? ?Basic Metabolic Panel: ?Recent Labs  ?Lab 11/29/21 ?1615 11/30/21 ?0906 12/01/21 ?8469  ?NA 132* 131* 136  ?K 4.1 3.4* 3.3*  ?CL 98 101 105  ?CO2 24 20* 23  ?GLUCOSE 105* 86 95  ?BUN '20 18 13  '$ ?CREATININE 0.70 0.55 0.49  ?CALCIUM 9.0 8.2* 7.9*  ? ?GFR: ?Estimated Creatinine Clearance: 49.1 mL/min (by C-G formula based on SCr of 0.49 mg/dL). ?Liver Function Tests: ?Recent Labs  ?Lab 11/29/21 ?1615  ?AST 27  ?ALT 28  ?ALKPHOS 121  ?BILITOT 1.6*  ?PROT 7.8  ?ALBUMIN 3.8  ? ?No results for input(s): LIPASE, AMYLASE in the last 168 hours. ?Recent Labs  ?Lab 11/29/21 ?2039  ?AMMONIA 19  ? ?Coagulation Profile: ?Recent Labs  ?Lab 11/29/21 ?1444  ?INR 1.1  ? ?Cardiac Enzymes: ?No results for input(s): CKTOTAL, CKMB, CKMBINDEX, TROPONINI in the last 168 hours. ?BNP (last 3 results) ?No results for input(s): PROBNP in the last 8760 hours. ?HbA1C: ?Recent Labs  ?  11/30/21 ?0421  ?HGBA1C 5.4  ? ?CBG: ?No results for input(s): GLUCAP in the last 168 hours. ?Lipid Profile: ?Recent Labs  ?  11/30/21 ?0421  ?CHOL 120  ?HDL 52  ?[place] 59  ?TRIG 45  ?CHOLHDL 2.3  ? ?Thyroid Function Tests: ?Recent Labs  ?  11/29/21 ?1615  ?TSH 1.475  ? ?Anemia Panel: ?Recent Labs  ?  11/29/21 ?2039  ?VITAMINB12 274  ? ?Sepsis Labs: ?Recent Labs  ?Lab 11/29/21 ?1631  ?PROCALCITON <0.10  ? ? ?Recent Results (from the past 240 hour(s))  ?Resp Panel by RT-PCR (Flu A&B, Covid) Nasopharyngeal Swab     Status: None  ? Collection Time: 11/29/21  2:44 PM  ? Specimen: Nasopharyngeal Swab; Nasopharyngeal(NP) swabs in vial transport medium  ?Result Value Ref Range Status  ? SARS Coronavirus 2 by RT PCR NEGATIVE NEGATIVE Final  ?  Comment: (NOTE) ?SARS-CoV-2 target nucleic acids are NOT DETECTED. ? ?The SARS-CoV-2 RNA is generally detectable in upper respiratory ?specimens during the acute phase of infection. The  lowest ?concentration of SARS-CoV-2 viral copies this assay can detect is ?138 copies/mL. A negative result does not preclude SARS-Cov-2 ?infection and should not be used as the sole basis for treatment or ?other patient management decisions. A negative result may occur with  ?improper specimen collection/handling, submission of specimen other ?than nasopharyngeal swab, presence of viral mutation(s) within the ?areas targeted by this assay, and inadequate number of viral ?copies(<138 copies/mL). A negative result must be combined with ?clinical observations, patient history, and epidemiological ?information. The expected result is Negative. ? ?Fact Sheet for Patients:  ?EntrepreneurPulse.com.au ? ?Fact Sheet for Healthcare Providers:  ?IncredibleEmployment.be ? ?This test is no t yet approved or cleared by the [place] FDA and  ?has been authorized for detection and/or diagnosis of SARS-CoV-2 by ?FDA under an  Emergency Use Authorization (EUA). This EUA will remain  ?in effect (meaning this test can be used) for the duration of the ?COVID-19 declaration under Section 564(b)(1) of the Act, 21 ?U.S.C.section 360bbb-3(b)(1), unless the authorization is terminated  ?or revoked sooner.  ? ? ?  ? Influenza A by PCR NEGATIVE NEGATIVE Final  ? Influenza B by PCR NEGATIVE NEGATIVE Final  ?  Comment: (NOTE) ?The Xpert Xpress SARS-CoV-2/FLU/RSV plus assay is intended as an aid ?in the diagnosis of influenza from Nasopharyngeal swab specimens and ?should not be used as a sole basis for treatment. Nasal washings and ?aspirates are unacceptable for Xpert Xpress SARS-CoV-2/FLU/RSV ?testing. ? ?Fact Sheet for Patients: ?EntrepreneurPulse.com.au ? ?Fact Sheet for Healthcare Providers: ?IncredibleEmployment.be ? ?This test is not yet approved or cleared by the [place] FDA and ?has been authorized for detection and/or diagnosis of SARS-CoV-2 by ?FDA under  an Emergency Use Authorization (EUA). This EUA will remain ?in effect (meaning this test can be used) for the duration of the ?COVID-19 declaration under Section 564(b)(1) of the Act, 21 U.S.C. ?section 360bbb-3(b)(1), un

## 2021-12-01 NOTE — Procedures (Signed)
PROCEDURE SUMMARY: ? ?Successful fluoroscopic guided lumbar puncture. ?Opening pressure 15 cm of H2O ?Yielded 13 mL of colorless CSF fluid.  ?No immediate complications.  ?Pt tolerated well.  ? ?Specimen was sent for labs. ? ?EBL < 70m ? ?MTsosie BillingD PA-C ?12/01/2021 ?3:14 PM ? ? ? ?

## 2021-12-01 NOTE — Evaluation (Signed)
Occupational Therapy Evaluation ?Patient Details ?Name: Teresa Sherman ?MRN: [id number] ?DOB: [birth year] ?Today's Date: 12/01/2021 ? ? ?History of Present Illness [name] is a 76 year old female with history of stroke, hyperlipidemia, insomnia, who presents to the emergency department for chief concerns of altered mental status.  ? ?Clinical Impression ?  ?Patient presenting with decreased Ind in self care, balance, functional mobility/transfers, endurance, and safety awareness. Patient's family present in room to confirm baseline. Pt living alone at baseline and independent without use of AD. Pt with recent CVA Oct 2022 but family reports she had returned to baseline and was getting ready to take driving test before this happened. Pt is emotionally labile during session and tearful but very motivated this session. She performs bed mobility with mod A to EOB and supervision for static sitting balance. She makes her needs known by reporting need for toileting. Mod stand pivot transfer to [facility] with assistance for clothing management and balance. Pt is able to do her own hygiene this session. She appears to be internally and externally distracted throughout requiring mod - max multimodal cuing to attend to tasks. R UE with decreased coordination, dexterity, strength, and speed. OT recommending acute inpatient rehab to address functional deficits and increase occupational performance before returning home. Patient will benefit from acute OT to increase overall independence in the areas of ADLs, functional mobility, and safety awareness in order to safely discharge to next venue of care. ?   ? ?Recommendations for follow up therapy are one component of a multi-disciplinary discharge planning process, led by the attending physician.  Recommendations may be updated based on patient status, additional functional criteria and insurance authorization.  ? ?Follow Up Recommendations ? Acute inpatient rehab  (3hours/day)  ?  ?Assistance Recommended at Discharge Frequent or constant Supervision/Assistance  ?Patient can return home with the following A lot of help with walking and/or transfers;A lot of help with bathing/dressing/bathroom;Assistance with feeding;Assistance with cooking/housework;Help with stairs or ramp for entrance;Assist for transportation;Direct supervision/assist for financial management;Direct supervision/assist for medications management ? ?  ?Functional Status Assessment ? Patient has had a recent decline in their functional status and demonstrates the ability to make significant improvements in function in a reasonable and predictable amount of time.  ?Equipment Recommendations ? Other (comment) (defer to next venue of care)  ?  ?   ?Precautions / Restrictions Precautions ?Precautions: Fall ?Restrictions ?Weight Bearing Restrictions: No  ? ?  ? ?Mobility Bed Mobility ?Overal bed mobility: Needs Assistance ?Bed Mobility: Supine to Sit, Sit to Supine ?  ?  ?Supine to sit: Min assist ?Sit to supine: Mod assist ?  ?  ?  ? ?Transfers ?Overall transfer level: Needs assistance ?Equipment used: 1 person hand held assist ?Transfers: Sit to/from Stand ?Sit to Stand: Mod assist ?  ?  ?  ?  ?  ?General transfer comment: mod lifting assistance to stand from bed <> BSC ?  ? ?  ?Balance Overall balance assessment: Needs assistance ?Sitting-balance support: Feet supported ?Sitting balance-Leahy Scale: Good ?  ?  ?Standing balance support: During functional activity ?Standing balance-Leahy Scale: Poor ?  ?  ?  ?  ?  ?  ?  ?  ?  ?  ?  ?  ?   ? ?ADL either performed or assessed with clinical judgement  ? ?ADL Overall ADL's : Needs assistance/impaired ?  ?  ?  ?  ?  ?  ?  ?  ?  ?  ?  ?  ?  Toilet Transfer: Moderate assistance;BSC/3in1;Squat-pivot ?  ?Toileting- Clothing Manipulation and Hygiene: Moderate assistance ?  ?  ?  ?  ?   ? ? ? ?Vision   ?Vision Assessment?: Vision impaired- to be further tested in functional  context  ?   ?   ?   ? ?Pertinent Vitals/Pain Pain Assessment ?Pain Assessment: No/denies pain  ? ? ? ?Hand Dominance Right ?  ?Extremity/Trunk Assessment Upper Extremity Assessment ?Upper Extremity Assessment: RUE deficits/detail ?RUE Deficits / Details: 3-/5 strength but decreased dexterity, coordination, and speed. ?  ?Lower Extremity Assessment ?Lower Extremity Assessment: Defer to PT evaluation ?  ?  ?  ?Communication Communication ?Communication: No difficulties ?  ?Cognition Arousal/Alertness: Awake/alert ?Behavior During Therapy:  (emotionally labile) ?Overall Cognitive Status: Impaired/Different from baseline ?Area of Impairment: Orientation, Attention, Following commands, Safety/judgement, Awareness, Problem solving ?  ?  ?  ?  ?  ?  ?  ?  ?Orientation Level: Person ?Current Attention Level: Focused ?  ?Following Commands: Follows one step commands with increased time ?Safety/Judgement: Decreased awareness of safety, Decreased awareness of deficits ?Awareness: Intellectual ?Problem Solving: Slow processing, Decreased initiation, Difficulty sequencing, Requires verbal cues, Requires tactile cues ?  ?  ?  ?   ?   ?   ? ? ?Home Living Family/patient expects to be discharged to:: Private residence ?Living Arrangements: Alone ?Available Help at Discharge: Family;Available PRN/intermittently ?Type of Home: House ?Home Access: Stairs to enter ?Entrance Stairs-Number of Steps: 1 ?Entrance Stairs-Rails: Right ?Home Layout: One level ?  ?  ?Bathroom Shower/Tub: Tub/shower unit ?  ?Bathroom Toilet: Standard ?  ?  ?Home Equipment: Shower seat;Grab bars - tub/shower;Cane - single point ?  ?Additional Comments: daughter reports she was returning to baseline level following a stroke in October.  Pt was getting to the point where she was going to be tested to be able to drive. ?  ? ?  ?Prior Functioning/Environment Prior Level of Function : Independent/Modified Independent ?  ?  ?  ?  ?  ?  ?  ?  ?  ? ?  ?  ?OT Problem  List: Decreased strength;Decreased activity tolerance;Impaired balance (sitting and/or standing);Decreased knowledge of use of DME or AE;Decreased safety awareness;Decreased cognition;Decreased coordination ?  ?   ?OT Treatment/Interventions: Self-care/ADL training;Therapeutic exercise;Therapeutic activities;Energy conservation;DME and/or AE instruction;Patient/family education;Manual therapy;Balance training  ?  ?OT Goals(Current goals can be found in the care plan section) Acute Rehab OT Goals ?Patient Stated Goal: to get stonger ?OT Goal Formulation: With patient ?Time For Goal Achievement: 12/15/21 ?Potential to Achieve Goals: Good ?ADL Goals ?Pt Will Perform Grooming: standing;with supervision ?Pt Will Perform Lower Body Dressing: with min guard assist;sit to/from stand ?Pt Will Transfer to Toilet: with min guard assist;ambulating ?Pt Will Perform Toileting - Clothing Manipulation and hygiene: with min guard assist;sit to/from stand  ?OT Frequency: Min 3X/week ?  ? ?   ?AM-PAC OT "6 Clicks" Daily Activity     ?Outcome Measure Help from another person eating meals?: A Little ?Help from another person taking care of personal grooming?: A Little ?Help from another person toileting, which includes using toliet, bedpan, or urinal?: A Lot ?Help from another person bathing (including washing, rinsing, drying)?: A Lot ?Help from another person to put on and taking off regular upper body clothing?: A Lot ?Help from another person to put on and taking off regular lower body clothing?: A Lot ?6 Click Score: 14 ?  ?End of Session Nurse Communication: Mobility status ? ?Activity Tolerance: Patient tolerated  treatment well ?Patient left: in bed;with call bell/phone within reach;with bed alarm set ? ?OT Visit Diagnosis: Unsteadiness on feet (R26.81);Repeated falls (R29.6);Muscle weakness (generalized) (M62.81)  ?              ?Time: 1610-9604 ?OT Time Calculation (min): 27 min ?Charges:  OT General Charges ?$OT Visit: 1  Visit ?OT Evaluation ?$OT Eval Moderate Complexity: 1 Mod ?OT Treatments ?$Self Care/Home Management : 8-22 mins ? ?Darleen Crocker, [place], OTR/L , CBIS ?ascom [phone]  ?12/01/21, 2:02 PM  ?

## 2021-12-01 NOTE — Procedures (Signed)
Routine EEG Report ? ?Teresa Sherman is a 76 y.o. female with a history of encephalopathy who is undergoing an EEG to evaluate for seizures. ? ?Report: This EEG was acquired with electrodes placed according to the International 10-20 electrode system (including Fp1, Fp2, F3, F4, C3, C4, P3, P4, O1, O2, T3, T4, T5, T6, A1, A2, Fz, Cz, Pz). The following electrodes were missing or displaced: none. ? ?The occipital dominant rhythm was 9 Hz. This activity is reactive to stimulation. Drowsiness was manifested by background fragmentation; deeper stages of sleep were identified by K complexes and sleep spindles. There was intermittent focal slowing over the left frontotemporal region, at times in the delta range. There were no interictal epileptiform discharges. There were no electrographic seizures identified. There was no abnormal response to photic stimulation. Hyperventilation was not performed.  ? ?Impression and clinical correlation: This EEG was obtained while awake and asleep and is abnormal due to intermittent left frontotemporal slowing, at times in the delta range, indicating focal dysfunction or potential underlying structural abnormality in this area. ? ?Su Monks, MD ?[facility] ?[phone] ? ?If 7pm- 7am, please page neurology on call as listed in [place].  ?

## 2021-12-01 NOTE — Progress Notes (Signed)
PT Cancellation Note ? ?Patient Details ?Name: Teresa Sherman ?MRN: [id number] ?DOB: 11-Oct-1945 ? ? ?Cancelled Treatment:    Reason Eval/Treat Not Completed: Patient at procedure or test/unavailable.  Pt chart reviewed and attempted to see pt.  Pt being transferred to X-Ray for Lumbar Puncture.  Spoke with daughter about therapy recommendations at length.  Will re-attempt to see pt at later date/time as medically appropriate. ? ? ?Gwenlyn Saran, PT, DPT ?12/01/21, 2:42 PM ? ?

## 2021-12-02 DIAGNOSIS — R4182 Altered mental status, unspecified: Secondary | ICD-10-CM | POA: Diagnosis not present

## 2021-12-02 LAB — BASIC METABOLIC PANEL
Anion gap: 6 (ref 5–15)
BUN: 9 mg/dL (ref 8–23)
CO2: 25 mmol/L (ref 22–32)
Calcium: 8.1 mg/dL — ABNORMAL LOW (ref 8.9–10.3)
Chloride: 108 mmol/L (ref 98–111)
Creatinine, Ser: 0.46 mg/dL (ref 0.44–1.00)
GFR, Estimated: >60 mL/min (ref >60)
Glucose, Bld: 83 mg/dL (ref 70–99)
Potassium: 3.1 mmol/L — ABNORMAL LOW (ref 3.5–5.1)
Sodium: 139 mmol/L (ref 135–145)

## 2021-12-02 LAB — URINE CULTURE: Culture: NO GROWTH

## 2021-12-02 LAB — CBC
HCT: 33.8 % — ABNORMAL LOW (ref 36.0–46.0)
Hemoglobin: 11.4 g/dL — ABNORMAL LOW (ref 12.0–15.0)
MCH: 29.8 pg (ref 26.0–34.0)
MCHC: 33.7 g/dL (ref 30.0–36.0)
MCV: 88.3 fL (ref 80.0–100.0)
Platelets: 187 10*3/uL (ref 150–400)
RBC: 3.83 MIL/uL — ABNORMAL LOW (ref 3.87–5.11)
RDW: 14.9 % (ref 11.5–15.5)
WBC: 6.8 10*3/uL (ref 4.0–10.5)
nRBC: 0 % (ref 0.0–0.2)

## 2021-12-02 LAB — MAGNESIUM: Magnesium: 1.9 mg/dL (ref 1.7–2.4)

## 2021-12-02 MED ORDER — ACETAMINOPHEN 325 MG PO TABS
650.0000 mg | ORAL_TABLET | Freq: Four times a day (QID) | ORAL | Status: DC | PRN
  Administered 2021-12-02 – 2021-12-03 (×2): 650 mg via ORAL
  Filled 2021-12-02 (×2): qty 2

## 2021-12-02 MED ORDER — POTASSIUM CHLORIDE CRYS ER 20 MEQ PO TBCR
40.0000 meq | EXTENDED_RELEASE_TABLET | ORAL | Status: AC
  Administered 2021-12-02 (×2): 40 meq via ORAL
  Filled 2021-12-02 (×2): qty 2

## 2021-12-02 MED ORDER — HYALURONIDASE HUMAN 150 UNIT/ML IJ SOLN
150.0000 [IU] | Freq: Once | INTRAMUSCULAR | Status: AC
  Administered 2021-12-02: 150 [IU] via SUBCUTANEOUS
  Filled 2021-12-02 (×2): qty 1

## 2021-12-02 NOTE — Progress Notes (Signed)
Physical Therapy Treatment ?Patient Details ?Name: Teresa Sherman ?MRN: [id number] ?DOB: 02-24-1946 ?Today's Date: 12/02/2021 ? ? ?History of Present Illness [name] is a 76 year old female with history of stroke, hyperlipidemia, insomnia, who presents to the emergency department for chief concerns of altered mental status. ? ?  ?PT Comments  ? ? Pt received in Semi-Fowler's position and agreeable to therapy.  Pt noting that she needed to use the restroom and therapist was able to assist with transfer to bathroom.  Pt performed transfers well, however has to be reminded of IV line and assistance with the navigation of it.  Pt much better with her mentation and is able to respond to one level commands and form more coherent sentences, however is still word searching throughout the session, at times responding with answers that do not correspond to the question ask.  Pt able to navigate hallway with use of HHA, then attempting to utilize IV pole for support.  Pt kicks the base of the IV pole on occasion and requires assistance navigating it.  Pt's peripheral vision assessed when returned to the room and has made much improvement on the R side at this time.  Pt still with significant deficits that will continue to be addressed during stay.  Pt left sitting at EOB with bed alarm set and family members in the room.  Nursing notified of placement of pt and mobility performance.  Current discharge plans to CIR remain appropriate at this time.  Pt will continue to benefit from skilled therapy in order to address deficits listed below. ? ? ?   ?Recommendations for follow up therapy are one component of a multi-disciplinary discharge planning process, led by the attending physician.  Recommendations may be updated based on patient status, additional functional criteria and insurance authorization. ? ?Follow Up Recommendations ? Acute inpatient rehab (3hours/day) ?  ?  ?Assistance Recommended at Discharge  Frequent or constant Supervision/Assistance  ?Patient can return home with the following A little help with walking and/or transfers;A little help with bathing/dressing/bathroom ?  ?Equipment Recommendations ? Other (comment) (TBD at next venue of care.)  ?  ?Recommendations for Other Services   ? ? ?  ?Precautions / Restrictions Precautions ?Precautions: Fall ?Restrictions ?Weight Bearing Restrictions: No  ?  ? ?Mobility ? Bed Mobility ?Overal bed mobility: Needs Assistance ?Bed Mobility: Supine to Sit, Sit to Supine ?  ?  ?Supine to sit: Min assist ?Sit to supine: Mod assist ?  ?General bed mobility comments: Pt able to stand with use of UE's on bed to come upright in standing position. ?  ? ?Transfers ?Overall transfer level: Needs assistance ?Equipment used: 1 person hand held assist ?Transfers: Sit to/from Stand ?Sit to Stand: Min guard ?  ?  ?  ?  ?  ?General transfer comment: CGA today and minimal assistance for steadying pt. ?  ? ?Ambulation/Gait ?Ambulation/Gait assistance: Min assist ?Gait Distance (Feet): 160 Feet ?Assistive device: 1 person hand held assist, IV Pole ?Gait Pattern/deviations: Step-through pattern, Decreased stride length ?Gait velocity: decreased ?  ?  ?General Gait Details: Pt with some difficulty navigating the IV pole at times.  CGA provided throughout for safety. ? ? ?Stairs ?  ?  ?  ?  ?  ? ? ?Wheelchair Mobility ?  ? ?Modified Rankin (Stroke Patients Only) ?  ? ? ?  ?Balance Overall balance assessment: Needs assistance ?Sitting-balance support: Feet supported ?Sitting balance-Leahy Scale: Good ?  ?  ?Standing balance support: No upper extremity  supported ?Standing balance-Leahy Scale: Fair ?  ?  ?  ?  ?  ?  ?  ?  ?  ?  ?  ?  ?  ? ?  ?Cognition Arousal/Alertness: Awake/alert ?  ?Overall Cognitive Status: Impaired/Different from baseline ?  ?  ?  ?  ?  ?  ?  ?  ?  ?Orientation Level: Person, Place ?Current Attention Level: Focused ?  ?Following Commands: Follows one step commands  inconsistently ?Safety/Judgement: Decreased awareness of safety ?  ?Problem Solving: Slow processing, Difficulty sequencing, Requires verbal cues, Requires tactile cues ?General Comments: AxO x2 today ?  ?  ? ?  ?Exercises   ? ?  ?General Comments   ?  ?  ? ?Pertinent Vitals/Pain Pain Assessment ?Pain Assessment: No/denies pain  ? ? ?Home Living   ?  ?Available Help at Discharge: Family;Available PRN/intermittently ?Type of Home: House ?  ?  ?  ?  ?  ?  ?   ?  ?Prior Function    ?  ?  ?   ? ?PT Goals (current goals can now be found in the care plan section) Acute Rehab PT Goals ?PT Goal Formulation: Patient unable to participate in goal setting ?Time For Goal Achievement: 12/14/21 ?Progress towards PT goals: Progressing toward goals ? ?  ?Frequency ? ? ? 7X/week ? ? ? ?  ?PT Plan Current plan remains appropriate  ? ? ?Co-evaluation   ?  ?  ?  ?  ? ?  ?AM-PAC PT "6 Clicks" Mobility   ?Outcome Measure ? Help needed turning from your back to your side while in a flat bed without using bedrails?: A Little ?Help needed moving from lying on your back to sitting on the side of a flat bed without using bedrails?: A Little ?Help needed moving to and from a bed to a chair (including a wheelchair)?: A Little ?Help needed standing up from a chair using your arms (e.g., wheelchair or bedside chair)?: A Little ?Help needed to walk in hospital room?: A Little ?Help needed climbing 3-5 steps with a railing? : A Lot ?6 Click Score: 17 ? ?  ?End of Session Equipment Utilized During Treatment: Gait belt ?Activity Tolerance: Patient tolerated treatment well ?Patient left: in bed;with call bell/phone within reach;with bed alarm set;with family/visitor present (seated EOB with alarm set.) ?Nurse Communication: Mobility status ?PT Visit Diagnosis: Unsteadiness on feet (R26.81);Other abnormalities of gait and mobility (R26.89);Muscle weakness (generalized) (M62.81);Difficulty in walking, not elsewhere classified (R26.2);Hemiplegia and  hemiparesis ?Hemiplegia - Right/Left: Right ?Hemiplegia - dominant/non-dominant: Dominant ?Hemiplegia - caused by: Unspecified ?  ? ? ?Time: 8828-0034 ?PT Time Calculation (min) (ACUTE ONLY): 31 min ? ?Charges:  $Gait Training: 23-37 mins          ?          ? ?Gwenlyn Saran, PT, DPT ?12/02/21, 1:11 PM ? ? ? ?Christie Nottingham ?12/02/2021, 1:05 PM ? ?

## 2021-12-02 NOTE — Progress Notes (Addendum)
Neurology progress note ? ?S: Aphasia significantly improved. Patient fully oriented. Mild diffuse weakness, but focal RUE weakness has nearly resolved. No new neurologic complaints today. CSF yesterday no pleiocytosis, abx discontinued. ? ?Data: ? ?MRI brain wwo: mild CSVID, otherwise normal for age ? ?MRI c spine wwo: ? ?1. Normal MRI appearance of the cervical spinal cord. No abnormal ?enhancement. ?2. Degenerative spondylosis at C4-5 and C5-6 with resultant mild to ?moderate right C5 and C6 foraminal stenosis. ?3. Left paracentral disc protrusion at C6-7, flattening the left ?hemi cord without cord signal changes. ?4. Moderate left C4 foraminal narrowing related to uncovertebral and ?facet disease. ? ?CNS imaging personally reviewed; I agree with above interpretations ? ?CTA H&N: WNL ? ?rEEG: abnormal due to intermittent left frontotemporal slowing, at times in the delta range, indicating focal dysfunction or potential underlying structural abnormality in this area ? ?O: ? ?Vitals:  ? 12/02/21 0754 12/02/21 1631  ?BP: 118/72 133/78  ?Pulse: 71 78  ?Resp: 16 18  ?Temp: 98.5 ?F (36.9 ?C) 98.1 ?F (36.7 ?C)  ?SpO2: 94% 98%  ? ?Gen: awake, alert, NAD ?CV: RRR ?Resp: normal WOB ? ?Neurologic exam ?MS: oriented to self, hospital, and daughter at bedside, able to follow some simple commands today ?Speech: sparse, delayed, mild dysarthria, able to answer some questions appropriately with 1-2 word answers ?*CN: PERRL, blinks to threat bilat, optic discs unable to be visualized 2/2 pupillary constriction, EOMI w/o nystagmus, no ptosis, sensation intact, face symmetric at rest, hearing intact to voice ?*Motor: 4+/5 LUE, BLE with subtle drift in all 3 extremities. 3/5 RUE able to briefly lift hand almost above her head ?*Sensory: SILT ?*Coordination:  UTA ?*Reflexes:  2+ and symmetric throughout without clonus; toes equiv bilat ?*Gait: deferred ? ?A/P: This is a 76 year old woman with past medical history significant for  depression, anxiety, hyperlipidemia, previous tiny acute infarct in the right PCA distribution with no residual deficits who is admitted for encephalopathy. She has had one similar but much less severe episode in the past that eventually resolved without etiology identified. MRI brain wwo and MRI c spine wwo unrevealing as to etiologies for her encephalopathy and RUE weakness, respectively. It is interesting that the focal slowing on her EEG localizes to the left frontotemporal region which controls both speech and right motor function although there is no apparent structural abnormality in that region on MRI. Given her persistent encephalopathy, leukocytosis, and headache with unknown cause LP was performed. Initial results not c/f bacterial meningitis therefore abx were stopped. Recommend continuation of acyclovir until HSV results negative. ? ?Very interestingly patient had similar presentation to [facility] in Oct that eventually resolved without etiology identified. She had comprehensive CSF evaluation there as well.  ? ?- No indication for abx for CNS coverage at this time ?- F/u outstanding CSF results ?- Continue acyclovir until HSV results negative. After HSV results patient OK for discharge from neuro standpoint ?- Autoimmune encephalopathy panels on serum (misc send out lab orders drawn 4/11 and 4/13) will take a few weeks to result and should be followed up by PCP  ?- PT/OT/SLP ?- Will continue to follow. ? ?son Peter Garter [phone]  ? ?Su Monks, MD ?[facility] ?[phone] ? ?If 7pm- 7am, please page neurology on call as listed in [place]. ?

## 2021-12-02 NOTE — Plan of Care (Signed)
?  Problem: Education: ?Goal: Knowledge of General Education information will improve ?Description: Including pain rating scale, medication(s)/side effects and non-pharmacologic comfort measures ?Outcome: Progressing ?  ?Problem: Health Behavior/Discharge Planning: ?Goal: Ability to manage health-related needs will improve ?Outcome: Progressing ?  ?Problem: Clinical Measurements: ?Goal: Ability to maintain clinical measurements within normal limits will improve ?Outcome: Progressing ?Goal: Will remain free from infection ?Outcome: Progressing ?Goal: Diagnostic test results will improve ?Outcome: Progressing ?Goal: Respiratory complications will improve ?Outcome: Progressing ?Goal: Cardiovascular complication will be avoided ?Outcome: Progressing ?  ?Problem: Activity: ?Goal: Risk for activity intolerance will decrease ?Outcome: Progressing ?  ?Problem: Nutrition: ?Goal: Adequate nutrition will be maintained ?Outcome: Progressing ?  ?Problem: Coping: ?Goal: Level of anxiety will decrease ?Outcome: Progressing ?  ?Problem: Elimination: ?Goal: Will not experience complications related to bowel motility ?Outcome: Progressing ?Goal: Will not experience complications related to urinary retention ?Outcome: Progressing ?  ?Problem: Pain Managment: ?Goal: General experience of comfort will improve ?Outcome: Progressing ?  ?Problem: Safety: ?Goal: Ability to remain free from injury will improve ?Outcome: Progressing ?  ?Problem: Skin Integrity: ?Goal: Risk for impaired skin integrity will decrease ?Outcome: Progressing ?  ?Problem: Education: ?Goal: Knowledge of disease or condition will improve ?Outcome: Progressing ?Goal: Knowledge of secondary prevention will improve (SELECT ALL) ?Outcome: Progressing ?Goal: Knowledge of patient specific risk factors will improve (INDIVIDUALIZE FOR PATIENT) ?Outcome: Progressing ?  ?Problem: Coping: ?Goal: Will verbalize positive feelings about self ?Outcome: Progressing ?  ?

## 2021-12-02 NOTE — Progress Notes (Signed)
This RN checked on patient this morning. Left forearm infiltrated. Redness, pain and blister noted. Pharmacy and charge RN notified. MD notified.  ?

## 2021-12-02 NOTE — Progress Notes (Signed)
?PROGRESS NOTE ? ?Teresa Sherman GXQ:[id number] DOB: 1946/01/23 DOA: 11/29/2021 ?PCP: System, Provider Not In ? ? LOS: 2 days  ? ?Brief Narrative / Interim history: ?[name] is a 76 year old female with history of stroke, hyperlipidemia, insomnia, who presents to the emergency department for chief concerns of altered mental status and aphasia.  She was initially weak on the right side.  COVID/influenza A/influenza B PCR were negative.  CT scan of the brain unremarkable.  Neurology consulted and she was admitted to the hospital.  She has a history of a stroke in October 2022 but after that she returned to baseline. ? ?Subjective / 24h Interval events: ?No significant complaints this morning, denies any chest pain, denies any shortness of breath.  Still aphasic, thinks the year is 1974 ? ?Assesement and Plan: ?Principal Problem: ?  Altered mental status ?Active Problems: ?  Depression with anxiety ?  Disordered sleep ?  Hyperlipidemia ?  History of stroke ? ?Assessment and Plan: ?Principal problem ?Altered mental status, aphasia, right upper extremity weakness -neurology consulted and following.  She underwent imaging of the brain which was fairly unremarkable.  She was placed on broad-spectrum antibiotics and underwent an LP on 4/11, findings not consistent with bacterial meningitis.  Discussed with neurology today, keep on antivirals until HSV PCR is back but discontinue antibacterials.  TSH was unremarkable.  Currently oligoclonal bands are pending, VDRL is pending, HSV 1/2 PCR pending, meningitis/encephalitis panel pending ? ?Active problems ?History of stroke -Resumed home aspirin 81 mg daily, atorvastatin 40 mg daily ? ?Hyperlipidemia -Atorvastatin 40 mg p.o. daily resumed ? ?Disordered sleep -Resumed melatonin 5 mg p.o. nightly ? ? ?Scheduled Meds: ?  stroke: early stages of recovery book   Does not apply Once  ? aspirin  81 mg Oral Daily  ? atorvastatin  40 mg Oral Daily  ? cyanocobalamin   1,000 mcg Intramuscular Once per day on Mon Wed Fri  ? enoxaparin (LOVENOX) injection  40 mg Subcutaneous QHS  ? mouth rinse  15 mL Mouth Rinse BID  ? melatonin  5 mg Oral QHS  ? potassium chloride  40 mEq Oral Q3H  ? thiamine  100 mg Oral Daily  ? traZODone  50 mg Oral QHS  ? ?Continuous Infusions: ? sodium chloride Stopped (12/02/21 0750)  ? acyclovir 610 mg (12/02/21 1448)  ? ?PRN Meds:.aspirin-acetaminophen-caffeine, hydrALAZINE, senna-docusate ? ?Diet Orders (From admission, onward)  ? ?  Start     Ordered  ? 12/01/21 1527  Diet regular Room service appropriate? Yes; Fluid consistency: Thin  Diet effective now       ?Question Answer Comment  ?Room service appropriate? Yes   ?Fluid consistency: Thin   ?  ? 12/01/21 1527  ? ?  ?  ? ?  ? ? ?DVT prophylaxis: enoxaparin (LOVENOX) injection 40 mg Start: 11/30/21 2200 ?SCD's Start: 11/29/21 1834 ? ? ?Lab Results  ?Component Value Date  ? PLT 187 12/02/2021  ? ? ?  Code Status: DNR ? ?Family Communication: Family at bedside ? ?Status is: Inpatient ? ?Remains inpatient appropriate because: Persistent symptoms ? ? ?Level of care: Med-Surg ? ?Consultants:  ?Neurology ? ?Procedures:  ?LP ? ?Objective: ?Vitals:  ? 12/01/21 1605 12/01/21 2013 12/02/21 0520 12/02/21 0754  ?BP: 130/74 122/79 113/73 118/72  ?Pulse: 80 82 69 71  ?Resp:  '16 20 16  '$ ?Temp: 99 ?F (37.2 ?C) 98.3 ?F (36.8 ?C) 98.4 ?F (36.9 ?C) 98.5 ?F (36.9 ?C)  ?TempSrc:  Oral    ?  SpO2: 100% 97% 97% 94%  ?Weight:      ?Height:      ? ? ?Intake/Output Summary (Last 24 hours) at 12/02/2021 1251 ?Last data filed at 12/01/2021 2311 ?Gross per 24 hour  ?Intake 1014.74 ml  ?Output --  ?Net 1014.74 ml  ? ?Wt Readings from Last 3 Encounters:  ?11/29/21 61.2 kg  ?02/15/19 56.7 kg  ?10/22/18 56.7 kg  ? ? ?Examination: ? ?Constitutional: NAD ?Eyes: no scleral icterus ?ENMT: Mucous membranes are moist.  ?Neck: normal, supple ?Respiratory: clear to auscultation bilaterally, no wheezing, no crackles. Normal respiratory effort. No  accessory muscle use.  ?Cardiovascular: Regular rate and rhythm, no murmurs / rubs / gallops.  ?Abdomen: non distended, no tenderness. Bowel sounds positive.  ?Musculoskeletal: no clubbing / cyanosis.  ?Skin: no rashes ?Neurologic: non focal  ? ?Data Reviewed: I have independently reviewed following labs and imaging studies  ? ?CBC ?Recent Labs  ?Lab 11/29/21 ?1615 11/30/21 ?0906 12/01/21 ?1517 12/02/21 ?0403  ?WBC 9.0 16.0* 7.7 6.8  ?HGB 14.3 13.8 12.8 11.4*  ?HCT 42.4 40.4 37.4 33.8*  ?PLT 215 201 175 187  ?MCV 87.8 87.6 88.2 88.3  ?MCH 29.6 29.9 30.2 29.8  ?MCHC 33.7 34.2 34.2 33.7  ?RDW 14.6 14.6 14.6 14.9  ?LYMPHSABS 2.4  --   --   --   ?MONOABS 0.9  --   --   --   ?EOSABS 0.0  --   --   --   ?BASOSABS 0.0  --   --   --   ? ? ?Recent Labs  ?Lab 11/29/21 ?1444 11/29/21 ?1615 11/29/21 ?1631 11/29/21 ?2039 11/30/21 ?0421 11/30/21 ?6160 12/01/21 ?7371 12/02/21 ?0403  ?NA  --  132*  --   --   --  131* 136 139  ?K  --  4.1  --   --   --  3.4* 3.3* 3.1*  ?CL  --  98  --   --   --  101 105 108  ?CO2  --  24  --   --   --  20* 23 25  ?GLUCOSE  --  105*  --   --   --  86 95 83  ?BUN  --  20  --   --   --  '18 13 9  '$ ?CREATININE  --  0.70  --   --   --  0.55 0.49 0.46  ?CALCIUM  --  9.0  --   --   --  8.2* 7.9* 8.1*  ?AST  --  27  --   --   --   --   --   --   ?ALT  --  28  --   --   --   --   --   --   ?ALKPHOS  --  121  --   --   --   --   --   --   ?BILITOT  --  1.6*  --   --   --   --   --   --   ?ALBUMIN  --  3.8  --   --   --   --   --   --   ?MG  --   --   --   --   --   --   --  1.9  ?PROCALCITON  --   --  <0.10  --   --   --   --   --   ?  INR 1.1  --   --   --   --   --   --   --   ?TSH  --  1.475  --   --   --   --   --   --   ?HGBA1C  --   --   --   --  5.4  --   --   --   ?AMMONIA  --   --   --  19  --   --   --   --   ? ? ?------------------------------------------------------------------------------------------------------------------ ?Recent Labs  ?  11/30/21 ?0421  ?CHOL 120  ?HDL 52  ?Newell 59  ?TRIG 45   ?CHOLHDL 2.3  ? ? ?Lab Results  ?Component Value Date  ? HGBA1C 5.4 11/30/2021  ? ?------------------------------------------------------------------------------------------------------------------ ?Recent Labs  ?  11/29/21 ?1615  ?TSH 1.475  ? ? ?Cardiac Enzymes ?No results for input(s): CKMB, TROPONINI, MYOGLOBIN in the last 168 hours. ? ?Invalid input(s): CK ?------------------------------------------------------------------------------------------------------------------ ?No results found for: BNP ? ?CBG: ?No results for input(s): GLUCAP in the last 168 hours. ? ?Recent Results (from the past 240 hour(s))  ?Resp Panel by RT-PCR (Flu A&B, Covid) Nasopharyngeal Swab     Status: None  ? Collection Time: 11/29/21  2:44 PM  ? Specimen: Nasopharyngeal Swab; Nasopharyngeal(NP) swabs in vial transport medium  ?Result Value Ref Range Status  ? SARS Coronavirus 2 by RT PCR NEGATIVE NEGATIVE Final  ?  Comment: (NOTE) ?SARS-CoV-2 target nucleic acids are NOT DETECTED. ? ?The SARS-CoV-2 RNA is generally detectable in upper respiratory ?specimens during the acute phase of infection. The lowest ?concentration of SARS-CoV-2 viral copies this assay can detect is ?138 copies/mL. A negative result does not preclude SARS-Cov-2 ?infection and should not be used as the sole basis for treatment or ?other patient management decisions. A negative result may occur with  ?improper specimen collection/handling, submission of specimen other ?than nasopharyngeal swab, presence of viral mutation(s) within the ?areas targeted by this assay, and inadequate number of viral ?copies(<138 copies/mL). A negative result must be combined with ?clinical observations, patient history, and epidemiological ?information. The expected result is Negative. ? ?Fact Sheet for Patients:  ?EntrepreneurPulse.com.au ? ?Fact Sheet for Healthcare Providers:  ?IncredibleEmployment.be ? ?This test is no t yet approved or cleared by  the [place] FDA and  ?has been authorized for detection and/or diagnosis of SARS-CoV-2 by ?FDA under an Emergency Use Authorization (EUA). This EUA will remain  ?in effect (meaning this test can be used)

## 2021-12-02 NOTE — Progress Notes (Signed)
CSF w/ normal cell count, not c/w bacterial meningitis, will d/c empiric abx coverage. Continue acyclovir until HSV PCR on CSF results negative. ? ?Su Monks, MD ?[facility] ?[phone] ? ?If 7pm- 7am, please page neurology on call as listed in [place]. ? ?
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
## 2021-12-02 NOTE — Progress Notes (Signed)
SLP Cancellation Note ? ?Patient Details ?Name: Teresa Sherman ?MRN: [id number] ?DOB: 1946/03/19 ? ? ?Cancelled treatment:       Reason Eval/Treat Not Completed: Other (comment) Pt demonstrated overtly intact swallow function for completion of final trials of breakfast and medication administration. Pt denied concern for PO intake recently. No further dysphagia services indicated. Pt with noted expressive language deficits, with neurology noting language deficits. Therefore, speech language evaluation completed this date.  ? ?[place] J Clapp, [place] CCC-SLP ? ? ?Martin[place] J Clapp ?12/02/2021, 10:16 AM ?

## 2021-12-03 DIAGNOSIS — R4182 Altered mental status, unspecified: Secondary | ICD-10-CM | POA: Diagnosis not present

## 2021-12-03 LAB — CBC
HCT: 35.5 % — ABNORMAL LOW (ref 36.0–46.0)
Hemoglobin: 11.9 g/dL — ABNORMAL LOW (ref 12.0–15.0)
MCH: 30.1 pg (ref 26.0–34.0)
MCHC: 33.5 g/dL (ref 30.0–36.0)
MCV: 89.6 fL (ref 80.0–100.0)
Platelets: 193 10*3/uL (ref 150–400)
RBC: 3.96 MIL/uL (ref 3.87–5.11)
RDW: 14.7 % (ref 11.5–15.5)
WBC: 7.6 10*3/uL (ref 4.0–10.5)
nRBC: 0 % (ref 0.0–0.2)

## 2021-12-03 LAB — URINALYSIS, ROUTINE W REFLEX MICROSCOPIC
Bacteria, UA: NONE SEEN
Bilirubin Urine: NEGATIVE
Glucose, UA: NEGATIVE mg/dL
Ketones, ur: NEGATIVE mg/dL
Leukocytes,Ua: NEGATIVE
Nitrite: NEGATIVE
Protein, ur: NEGATIVE mg/dL
Specific Gravity, Urine: 1.01 (ref 1.005–1.030)
Squamous Epithelial / HPF: NONE SEEN (ref 0–5)
pH: 8 (ref 5.0–8.0)

## 2021-12-03 LAB — VDRL, CSF: VDRL Quant, CSF: NONREACTIVE

## 2021-12-03 LAB — BASIC METABOLIC PANEL
Anion gap: 6 (ref 5–15)
BUN: 13 mg/dL (ref 8–23)
CO2: 24 mmol/L (ref 22–32)
Calcium: 8.8 mg/dL — ABNORMAL LOW (ref 8.9–10.3)
Chloride: 109 mmol/L (ref 98–111)
Creatinine, Ser: 0.61 mg/dL (ref 0.44–1.00)
GFR, Estimated: >60 mL/min (ref >60)
Glucose, Bld: 95 mg/dL (ref 70–99)
Potassium: 3.3 mmol/L — ABNORMAL LOW (ref 3.5–5.1)
Sodium: 139 mmol/L (ref 135–145)

## 2021-12-03 LAB — HSV 1/2 PCR, CSF
HSV-1 DNA: NEGATIVE
HSV-2 DNA: NEGATIVE

## 2021-12-03 MED ORDER — POTASSIUM CHLORIDE CRYS ER 20 MEQ PO TBCR
40.0000 meq | EXTENDED_RELEASE_TABLET | Freq: Once | ORAL | Status: AC
Start: 2021-12-03 — End: 2021-12-03
  Administered 2021-12-03: 40 meq via ORAL
  Filled 2021-12-03: qty 2

## 2021-12-03 NOTE — Progress Notes (Signed)
Patient has pulled out 2 IVs tonight. Pleasantly confused and forgetful. Attempted to restart x2 but could not obtain. IV team had to restart on day shift. Patient discharges on Friday but has acyclovir IV for unconfirmed viral meningitis (test pending). Other antibiotics were discontinued because bacterial meningitis was ruled out. Patient states that she does not want to be stuck again. Notified NP Randol Kern. ?

## 2021-12-03 NOTE — Progress Notes (Signed)
Speech Language Pathology Treatment:    ?Patient Details ?Name: Teresa Sherman ?MRN: [id number] ?DOB: 19-Feb-1946 ?Today's Date: 12/03/2021 ?Time: 1250-1315 ?SLP Time Calculation (min) (ACUTE ONLY): 25 min ? ?Assessment / Plan / Recommendation ?Clinical Impression ? Pt seen for speech/language/cognitive re-assessment. Pt alert and cooperative. Daughter and niece at bedside. ? ?Re-assessment completed via informal means. Based on today's assessment, pt presents with cognitive-linguistic deficits affecting attention, memory (immediate, short term; mild-moderate difficulty), problem solving (mild-moderate difficulty with verbal problem solving), abstract reasoning (significant difficulty with abstract categorizations), complex auditory comprehension (80% for complex yes/no; ~50% complex/2-step directive), and complex verbal expression. No s/sx dysarthria and x2 instances of anomia noted. Pt with emerging use of circumlocution strategy to repair communication breakdowns. Pt benefits from extra time and verbal/visual cueing to follow 2-step and complex commands. Pt expresses complex ideas with extra time. Improved verbal fluency compared to previous sessions. Pt and family note improvement in cognitive-lingusitic ability; however, deficits persist.  ? ?Based on today's tx, anticipate need for 24 hour supervision/assistance and post-acute SLP services for higher level cognitive-linguistic assessment/tx. SLP to f/u per POC for x1 additional session for diagnostic tx/re-evaluation. ? ?Pt, family,and RN made aware of results, recommendations, and SLP POC. Pt verbailzed understanding/agreement. ?   ?HPI HPI: This is a 76 year old woman with past medical history significant for depression, anxiety, hyperlipidemia, previous tiny acute infarct in the right PCA distribution with no residual deficits who is admitted for encephalopathy.  At baseline she is independent and lives alone.  She was previously diagnosed with mild  cognitive impairment but is able to speak in function with minimal issues.  She does occasionally display emotional lability and say inappropriate things at times however these have been ongoing issues.  Last known well was 2 to 3 days prior to presentation when family saw her last.  She was brought in by EMS after she was discovered by a [employer] cable guy he saw her through a window standing naked in the living room and acting abnormally.  In the ED she was essentially globally aphasic.  She also had notable weakness in the right upper and lower extremity. CT head and MRI brain wo contrast showed NAICP.  CNS imaging was personally reviewed.  UA not c/f infection. Blood cultures x2 pending. No lab disturbances to explain presentation UDS pan-negative. Ammonia, TSH WNL.  White blood cell count was 9 on admission and increased to 16 today without clear reason.  No source for infection has been identified. MRI 11/30/21 revealing "Normal MRI appearance of the cervical spinal cord. No abnormal  enhancement." EEG 12/01/21: "This EEG was obtained while awake and asleep and is abnormal due to intermittent left frontotemporal slowing, at times in the delta range, indicating focal dysfunction or potential underlying structural abnormality in this area." CSF w/ normal cell count, not c/w bacterial meningitis. ?  ?   ?SLP Plan ? Continue with current plan of care ? ?  ?  ?Recommendations for follow up therapy are one component of a multi-disciplinary discharge planning process, led by the attending physician.  Recommendations may be updated based on patient status, additional functional criteria and insurance authorization. ?  ? ?Recommendations  ?   ?   ?    ?   ? ? ? ? General recommendations: Rehab consult ?Follow Up Recommendations: Acute inpatient rehab (3hours/day) ?Assistance recommended at discharge: Frequent or constant Supervision/Assistance ?SLP Visit Diagnosis: Cognitive communication deficit (R41.841) ?Plan:  Continue with current plan of care ? ? ? ? ?  ?  ? ?  Cherrie Gauze, M.S., CCC-SLP ?Speech-Language Pathologist ?[facility] ?[phone] ([place])  ? ?Teresa Sherman ? ?12/03/2021, 1:50 PM ?

## 2021-12-03 NOTE — Progress Notes (Signed)
Patient pulled out 20G IV ?

## 2021-12-03 NOTE — Progress Notes (Signed)
Physical Therapy Treatment ?Patient Details ?Name: Teresa Sherman ?MRN: [id number] ?DOB: 12-Feb-1946 ?Today's Date: 12/03/2021 ? ? ?History of Present Illness Ms. Quiera Cutright is a 76 year old female with history of stroke, hyperlipidemia, insomnia, who presents to the emergency department for chief concerns of altered mental status. ? ?  ?PT Comments  ? ? Pt received in Semi-Fowler's position and agreeable to therapy.  Pt's daughter present throughout session as well.  Pt continues to make steady improvements functionally with mobility and strengthening, however is still having some cognitive deficits, noting hallucinations of seeing ghosts, bugs, and worms in room at times.  Pt also noted to see a worm in the floor when ambulating with author.  Pt jumped due to thought of it being a worm on the floor.  Pt is performing much better physically, which is why d/c recommendation to home with HHPT is her updated status, however pt would benefit from having 24/7 care at home due to cognitive deficits at this time.  Current discharge plans to HHPT are appropriate at this time.  Pt will continue to benefit from skilled therapy in order to address deficits listed below. ? ?  ?Recommendations for follow up therapy are one component of a multi-disciplinary discharge planning process, led by the attending physician.  Recommendations may be updated based on patient status, additional functional criteria and insurance authorization. ? ?Follow Up Recommendations ? Home health PT ?  ?  ?Assistance Recommended at Discharge Frequent or constant Supervision/Assistance  ?Patient can return home with the following A little help with walking and/or transfers;A little help with bathing/dressing/bathroom ?  ?Equipment Recommendations ? Other (comment) (TBD at next venue of care.)  ?  ?Recommendations for Other Services   ? ? ?  ?Precautions / Restrictions Precautions ?Precautions: Fall ?Restrictions ?Weight Bearing Restrictions: No   ?  ? ?Mobility ? Bed Mobility ?Overal bed mobility: Needs Assistance ?Bed Mobility: Supine to Sit, Sit to Supine ?  ?  ?Supine to sit: Min guard ?Sit to supine: Min guard ?  ?General bed mobility comments: Pt able to stand with use of UE's on bed to come upright in standing position. ?  ? ?Transfers ?Overall transfer level: Needs assistance ?Equipment used: 1 person hand held assist ?Transfers: Sit to/from Stand ?Sit to Stand: Min guard ?  ?  ?  ?  ?  ?General transfer comment: CGA today with no need for steadying of pt. ?  ? ?Ambulation/Gait ?Ambulation/Gait assistance: Min guard, Supervision ?Gait Distance (Feet): 160 Feet ?Assistive device: 1 person hand held assist, IV Pole ?Gait Pattern/deviations: Step-through pattern, Decreased stride length ?Gait velocity: decreased ?  ?  ?General Gait Details: Pt with some difficulty navigating the IV pole at times.  CGA provided throughout for safety. ? ? ?Stairs ?  ?  ?  ?  ?  ? ? ?Wheelchair Mobility ?  ? ?Modified Rankin (Stroke Patients Only) ?  ? ? ?  ?Balance Overall balance assessment: Needs assistance ?Sitting-balance support: Feet supported ?Sitting balance-Leahy Scale: Good ?  ?  ?Standing balance support: No upper extremity supported ?Standing balance-Leahy Scale: Fair ?  ?  ?  ?  ?  ?  ?  ?  ?  ?  ?  ?  ?  ? ?  ?Cognition Arousal/Alertness: Awake/alert ?  ?Overall Cognitive Status: Impaired/Different from baseline ?  ?  ?  ?  ?  ?  ?  ?  ?  ?Orientation Level: Person, Place ?Current Attention Level: Focused ?  ?Following  Commands: Follows one step commands inconsistently ?Safety/Judgement: Decreased awareness of safety ?  ?Problem Solving: Slow processing, Difficulty sequencing, Requires verbal cues, Requires tactile cues ?General Comments: AxO x2 today ?  ?  ? ?  ?Exercises   ? ?  ?General Comments   ?  ?  ? ?Pertinent Vitals/Pain Pain Assessment ?Pain Assessment: No/denies pain  ? ? ?Home Living   ?  ?  ?  ?  ?  ?  ?  ?  ?  ?   ?  ?Prior Function    ?  ?  ?    ? ?PT Goals (current goals can now be found in the care plan section) Acute Rehab PT Goals ?PT Goal Formulation: With patient ?Time For Goal Achievement: 12/14/21 ?Progress towards PT goals: Progressing toward goals (to go home.) ? ?  ?Frequency ? ? ? 7X/week ? ? ? ?  ?PT Plan Discharge plan needs to be updated  ? ? ?Co-evaluation   ?  ?  ?  ?  ? ?  ?AM-PAC PT "6 Clicks" Mobility   ?Outcome Measure ? Help needed turning from your back to your side while in a flat bed without using bedrails?: A Little ?Help needed moving from lying on your back to sitting on the side of a flat bed without using bedrails?: A Little ?Help needed moving to and from a bed to a chair (including a wheelchair)?: A Little ?Help needed standing up from a chair using your arms (e.g., wheelchair or bedside chair)?: A Little ?Help needed to walk in hospital room?: A Little ?Help needed climbing 3-5 steps with a railing? : A Lot ?6 Click Score: 17 ? ?  ?End of Session Equipment Utilized During Treatment: Gait belt ?Activity Tolerance: Patient tolerated treatment well ?Patient left: in bed;with call bell/phone within reach;with bed alarm set;with family/visitor present (seated EOB with alarm set.) ?Nurse Communication: Mobility status ?PT Visit Diagnosis: Unsteadiness on feet (R26.81);Other abnormalities of gait and mobility (R26.89);Muscle weakness (generalized) (M62.81);Difficulty in walking, not elsewhere classified (R26.2);Hemiplegia and hemiparesis ?Hemiplegia - Right/Left: Right ?Hemiplegia - dominant/non-dominant: Dominant ?Hemiplegia - caused by: Unspecified ?  ? ? ?Time: 1113-1140 ?PT Time Calculation (min) (ACUTE ONLY): 27 min ? ?Charges:  $Gait Training: 23-37 mins          ?          ? ?Gwenlyn Saran, PT, DPT ?12/03/21, 2:58 PM ? ? ? ?Christie Nottingham ?12/03/2021, 2:51 PM ? ?

## 2021-12-03 NOTE — Progress Notes (Signed)
Rounded on patient and she was getting up for bathroom so alarm went off. Patient does not follow directions to call staff when needing to use restroom. She than states that she is going home. Tells me that she sees ghosts in her room smiling at her but states that they are nice ghosts. Will report to day team.  ?

## 2021-12-03 NOTE — Progress Notes (Signed)
?PROGRESS NOTE ? ?Teresa Sherman BMW:[id number] DOB: 04-14-1946 DOA: 11/29/2021 ?PCP: System, Provider Not In ? ? LOS: 3 days  ? ?Brief Narrative / Interim history: ?[name] is a 76 year old female with history of stroke, hyperlipidemia, insomnia, who presents to the emergency department for chief concerns of altered mental status and aphasia.  She was initially weak on the right side.  COVID/influenza A/influenza B PCR were negative.  CT scan of the brain unremarkable.  Neurology consulted and she was admitted to the hospital.  She has a history of a stroke in October 2022 but after that she returned to baseline. ? ?Subjective / 24h Interval events: ?Some intermittent confusion overnight/hallucinations, she is relatively clear this morning.  She knows that the year is 2023 which is improved since yesterday ? ?Assesement and Plan: ?Principal Problem: ?  Altered mental status ?Active Problems: ?  Depression with anxiety ?  Disordered sleep ?  Hyperlipidemia ?  History of stroke ? ?Assessment and Plan: ?Principal problem ?Altered mental status, aphasia, right upper extremity weakness -neurology consulted and following.  Discussed with Dr. Quinn Axe.  She underwent imaging of the brain which was fairly unremarkable.  She was placed on broad-spectrum antibiotics and underwent an LP on 4/11, findings not consistent with bacterial meningitis and antibacterials were discontinued 4/12.  Continue to keep on antivirals until HSV PCR is back, hopefully today.  TSH was unremarkable.  Currently oligoclonal bands are pending, VDRL is pending, HSV 1/2 PCR pending, meningitis/encephalitis panel pending ? ?Active problems ?History of stroke -Resumed home aspirin 81 mg daily, atorvastatin 40 mg daily ? ?Hyperlipidemia -Atorvastatin 40 mg p.o. daily resumed ? ?Disordered sleep -Resumed melatonin 5 mg p.o. nightly ? ? ?Scheduled Meds: ?  stroke: early stages of recovery book   Does not apply Once  ? aspirin  81 mg Oral  Daily  ? atorvastatin  40 mg Oral Daily  ? cyanocobalamin  1,000 mcg Intramuscular Once per day on Mon Wed Fri  ? enoxaparin (LOVENOX) injection  40 mg Subcutaneous QHS  ? mouth rinse  15 mL Mouth Rinse BID  ? melatonin  5 mg Oral QHS  ? potassium chloride  40 mEq Oral Once  ? thiamine  100 mg Oral Daily  ? traZODone  50 mg Oral QHS  ? ?Continuous Infusions: ? sodium chloride Stopped (12/03/21 0138)  ? acyclovir Stopped (12/02/21 1807)  ? ?PRN Meds:.acetaminophen, aspirin-acetaminophen-caffeine, senna-docusate ? ?Diet Orders (From admission, onward)  ? ?  Start     Ordered  ? 12/01/21 1527  Diet regular Room service appropriate? Yes; Fluid consistency: Thin  Diet effective now       ?Question Answer Comment  ?Room service appropriate? Yes   ?Fluid consistency: Thin   ?  ? 12/01/21 1527  ? ?  ?  ? ?  ? ? ?DVT prophylaxis: enoxaparin (LOVENOX) injection 40 mg Start: 11/30/21 2200 ?SCD's Start: 11/29/21 1834 ? ? ?Lab Results  ?Component Value Date  ? PLT 193 12/03/2021  ? ? ?  Code Status: DNR ? ?Family Communication: We will discuss with daughter later ? ?Status is: Inpatient ? ?Remains inpatient appropriate because: Persistent symptoms.  If HSV PCR comes back negative and continues to improve anticipate potential discharge on Friday ? ?Level of care: Med-Surg ? ?Consultants:  ?Neurology ? ?Procedures:  ?LP ? ?Objective: ?Vitals:  ? 12/02/21 1631 12/02/21 1941 12/03/21 0411 12/03/21 0719  ?BP: 133/78 138/85 (!) 142/93 128/84  ?Pulse: 78 86 83 88  ?Resp: 18 18 18  18  ?Temp: 98.1 ?F (36.7 ?C) 97.7 ?F (36.5 ?C) 98.4 ?F (36.9 ?C) (!) 97.4 ?F (36.3 ?C)  ?TempSrc:      ?SpO2: 98% 98% 96% 95%  ?Weight:      ?Height:      ? ? ?Intake/Output Summary (Last 24 hours) at 12/03/2021 0803 ?Last data filed at 12/03/2021 1027 ?Gross per 24 hour  ?Intake 1931.77 ml  ?Output --  ?Net 1931.77 ml  ? ? ?Wt Readings from Last 3 Encounters:  ?11/29/21 61.2 kg  ?02/15/19 56.7 kg  ?10/22/18 56.7 kg  ? ? ?Examination: ? ?Constitutional:  NAD ?Eyes: lids and conjunctivae normal, no scleral icterus ?ENMT: mmm ?Neck: normal, supple ?Respiratory: clear to auscultation bilaterally, no wheezing, no crackles. Normal respiratory effort.  ?Cardiovascular: Regular rate and rhythm, no murmurs / rubs / gallops. No LE edema. ?Abdomen: soft, no distention, no tenderness. Bowel sounds positive.  ?Skin: no rashes ?Neurologic: no focal deficits, equal strength ? ?Data Reviewed: I have independently reviewed following labs and imaging studies  ? ?CBC ?Recent Labs  ?Lab 11/29/21 ?1615 11/30/21 ?0906 12/01/21 ?2536 12/02/21 ?0403 12/03/21 ?0356  ?WBC 9.0 16.0* 7.7 6.8 7.6  ?HGB 14.3 13.8 12.8 11.4* 11.9*  ?HCT 42.4 40.4 37.4 33.8* 35.5*  ?PLT 215 201 175 187 193  ?MCV 87.8 87.6 88.2 88.3 89.6  ?MCH 29.6 29.9 30.2 29.8 30.1  ?MCHC 33.7 34.2 34.2 33.7 33.5  ?RDW 14.6 14.6 14.6 14.9 14.7  ?LYMPHSABS 2.4  --   --   --   --   ?MONOABS 0.9  --   --   --   --   ?EOSABS 0.0  --   --   --   --   ?BASOSABS 0.0  --   --   --   --   ? ? ? ?Recent Labs  ?Lab 11/29/21 ?1444 11/29/21 ?1615 11/29/21 ?1631 11/29/21 ?2039 11/30/21 ?0421 11/30/21 ?6440 12/01/21 ?3474 12/02/21 ?0403 12/03/21 ?0356  ?NA  --  132*  --   --   --  131* 136 139 139  ?K  --  4.1  --   --   --  3.4* 3.3* 3.1* 3.3*  ?CL  --  98  --   --   --  101 105 108 109  ?CO2  --  24  --   --   --  20* '23 25 24  '$ ?GLUCOSE  --  105*  --   --   --  86 95 83 95  ?BUN  --  20  --   --   --  '18 13 9 13  '$ ?CREATININE  --  0.70  --   --   --  0.55 0.49 0.46 0.61  ?CALCIUM  --  9.0  --   --   --  8.2* 7.9* 8.1* 8.8*  ?AST  --  27  --   --   --   --   --   --   --   ?ALT  --  28  --   --   --   --   --   --   --   ?ALKPHOS  --  121  --   --   --   --   --   --   --   ?BILITOT  --  1.6*  --   --   --   --   --   --   --   ?ALBUMIN  --  3.8  --   --   --   --   --   --   --   ?MG  --   --   --   --   --   --   --  1.9  --   ?PROCALCITON  --   --  <0.10  --   --   --   --   --   --   ?INR 1.1  --   --   --   --   --   --   --   --   ?TSH   --  1.475  --   --   --   --   --   --   --   ?HGBA1C  --   --   --   --  5.4  --   --   --   --   ?AMMONIA  --   --   --  19  --   --   --   --   --   ? ? ? ?------------------------------------------------------------------------------------------------------------------ ?No results for input(s): CHOL, HDL, LDLCALC, TRIG, CHOLHDL, LDLDIRECT in the last 72 hours. ? ? ?Lab Results  ?Component Value Date  ? HGBA1C 5.4 11/30/2021  ? ?------------------------------------------------------------------------------------------------------------------ ?No results for input(s): TSH, T4TOTAL, T3FREE, THYROIDAB in the last 72 hours. ? ?Invalid input(s): FREET3 ? ? ?Cardiac Enzymes ?No results for input(s): CKMB, TROPONINI, MYOGLOBIN in the last 168 hours. ? ?Invalid input(s): CK ?------------------------------------------------------------------------------------------------------------------ ?No results found for: BNP ? ?CBG: ?No results for input(s): GLUCAP in the last 168 hours. ? ?Recent Results (from the past 240 hour(s))  ?Resp Panel by RT-PCR (Flu A&B, Covid) Nasopharyngeal Swab     Status: None  ? Collection Time: 11/29/21  2:44 PM  ? Specimen: Nasopharyngeal Swab; Nasopharyngeal(NP) swabs in vial transport medium  ?Result Value Ref Range Status  ? SARS Coronavirus 2 by RT PCR NEGATIVE NEGATIVE Final  ?  Comment: (NOTE) ?SARS-CoV-2 target nucleic acids are NOT DETECTED. ? ?The SARS-CoV-2 RNA is generally detectable in upper respiratory ?specimens during the acute phase of infection. The lowest ?concentration of SARS-CoV-2 viral copies this assay can detect is ?138 copies/mL. A negative result does not preclude SARS-Cov-2 ?infection and should not be used as the sole basis for treatment or ?other patient management decisions. A negative result may occur with  ?improper specimen collection/handling, submission of specimen other ?than nasopharyngeal swab, presence of viral mutation(s) within the ?areas targeted by this  assay, and inadequate number of viral ?copies(<138 copies/mL). A negative result must be combined with ?clinical observations, patient history, and epidemiological ?information. The expected result is Negative. ?

## 2021-12-03 NOTE — Progress Notes (Signed)
Occupational Therapy Treatment ?Patient Details ?Name: Teresa Sherman ?MRN: [id number] ?DOB: 1945-12-10 ?Today's Date: 12/03/2021 ? ? ?History of present illness [name] is a 76 year old female with history of stroke, hyperlipidemia, insomnia, who presents to the emergency department for chief concerns of altered mental status. ?  ?OT comments ? Upon entering the room, pt supine in bed with daughter present in the room. Pt is agreeable to OT intervention. Pt performing bed mobility without physical assistance to EOB. Pt is hallucinating and seeing bugs on the floor during session and reaches down several times while sitting and standing to swipe at them needing close supervision - min guard for safety. Pt demonstrates ability to perform figure four position to doff/son B socks without physical assistance. Pt ambulating to bathroom for toileting needs with increased time for sequencing this session. Pt ambulates 220' in hallway without use of AD and supervision. Pt does need min cuing for safety awareness throughout. OT discussed changed in recommendation with pt's daughter for home with [facility] and needing 24/7 supervision for safety at discharge.   ? ?Recommendations for follow up therapy are one component of a multi-disciplinary discharge planning process, led by the attending physician.  Recommendations may be updated based on patient status, additional functional criteria and insurance authorization. ?   ?Follow Up Recommendations ? Home health OT  ?  ?Assistance Recommended at Discharge Frequent or constant Supervision/Assistance  ?Patient can return home with the following ? A little help with walking and/or transfers;A little help with bathing/dressing/bathroom;Help with stairs or ramp for entrance;Assist for transportation;Assistance with cooking/housework;Direct supervision/assist for financial management;Direct supervision/assist for medications management ?  ?Equipment Recommendations ? None  recommended by OT  ?  ?   ?Precautions / Restrictions Precautions ?Precautions: Fall ?Restrictions ?Weight Bearing Restrictions: No  ? ? ?  ? ?Mobility Bed Mobility ?Overal bed mobility: Needs Assistance ?Bed Mobility: Supine to Sit, Sit to Supine ?  ?  ?Supine to sit: Supervision ?Sit to supine: Supervision ?  ?General bed mobility comments: no physical assistance needed but min cuing for technique ?  ? ?Transfers ?Overall transfer level: Needs assistance ?Equipment used: 1 person hand held assist ?Transfers: Sit to/from Stand ?Sit to Stand: Min guard, Supervision ?  ?  ?  ?  ?  ?General transfer comment: CGA progressing to supervision ?  ?  ?Balance Overall balance assessment: Needs assistance ?Sitting-balance support: Feet supported ?Sitting balance-Leahy Scale: Good ?  ?  ?Standing balance support: No upper extremity supported ?Standing balance-Leahy Scale: Fair ?  ?  ?  ?  ?  ?  ?  ?  ?  ?  ?  ?  ?   ? ?ADL either performed or assessed with clinical judgement  ? ?ADL Overall ADL's : Needs assistance/impaired ?  ?  ?Grooming: Wash/dry hands;Standing;Supervision/safety ?  ?  ?  ?  ?  ?  ?  ?  ?  ?Toilet Transfer: Supervision/safety;Ambulation ?  ?Toileting- Water quality scientist and Hygiene: Min guard;Sit to/from stand ?  ?  ?  ?Functional mobility during ADLs: Supervision/safety ?  ?  ? ? ? ?Cognition Arousal/Alertness: Awake/alert ?Behavior During Therapy: Impulsive ?Overall Cognitive Status: Impaired/Different from baseline ?Area of Impairment: Orientation, Attention, Following commands, Safety/judgement, Awareness, Problem solving ?  ?  ?  ?  ?  ?  ?  ?  ?Orientation Level: Person, Place ?Current Attention Level: Focused ?  ?Following Commands: Follows one step commands inconsistently ?Safety/Judgement: Decreased awareness of safety, Decreased awareness of deficits ?Awareness: Intellectual ?  Problem Solving: Slow processing, Difficulty sequencing, Requires verbal cues, Requires tactile cues ?General Comments:  AxO x2 today ?  ?  ?   ?   ?   ?   ? ? ?Pertinent Vitals/ Pain       Pain Assessment ?Pain Assessment: No/denies pain ? ?   ?   ? ?Frequency ? Min 3X/week  ? ? ? ? ?  ?Progress Toward Goals ? ?OT Goals(current goals can now be found in the care plan section) ? Progress towards OT goals: Progressing toward goals ? ?Acute Rehab OT Goals ?Patient Stated Goal: to get stonger ?OT Goal Formulation: With patient/family ?Time For Goal Achievement: 12/15/21 ?Potential to Achieve Goals: Good  ?Plan Frequency remains appropriate;Discharge plan needs to be updated   ? ?   ?AM-PAC OT "6 Clicks" Daily Activity     ?Outcome Measure ? ? Help from another person eating meals?: None ?Help from another person taking care of personal grooming?: None ?Help from another person toileting, which includes using toliet, bedpan, or urinal?: A Little ?Help from another person bathing (including washing, rinsing, drying)?: A Little ?Help from another person to put on and taking off regular upper body clothing?: None ?Help from another person to put on and taking off regular lower body clothing?: A Little ?6 Click Score: 21 ? ?  ?End of Session   ? ?OT Visit Diagnosis: Unsteadiness on feet (R26.81);Repeated falls (R29.6);Muscle weakness (generalized) (M62.81) ?  ?Activity Tolerance Patient tolerated treatment well ?  ?Patient Left in bed;with call bell/phone within reach;with bed alarm set ?  ?Nurse Communication Mobility status ?  ? ?   ? ?Time: 6440-3474 ?OT Time Calculation (min): 26 min ? ?Charges: OT General Charges ?$OT Visit: 1 Visit ?OT Treatments ?$Self Care/Home Management : 8-22 mins ?$Neuromuscular Re-education: 8-22 mins ? ?Darleen Crocker, MS, OTR/L , CBIS ?ascom [phone]  ?12/03/21, 3:17 PM  ?

## 2021-12-04 DIAGNOSIS — G4700 Insomnia, unspecified: Secondary | ICD-10-CM | POA: Diagnosis present

## 2021-12-04 DIAGNOSIS — R4182 Altered mental status, unspecified: Secondary | ICD-10-CM | POA: Diagnosis not present

## 2021-12-04 LAB — CSF CULTURE W GRAM STAIN
Culture: NO GROWTH
Gram Stain: NONE SEEN

## 2021-12-04 LAB — BASIC METABOLIC PANEL
Anion gap: 8 (ref 5–15)
BUN: 18 mg/dL (ref 8–23)
CO2: 25 mmol/L (ref 22–32)
Calcium: 9.3 mg/dL (ref 8.9–10.3)
Chloride: 106 mmol/L (ref 98–111)
Creatinine, Ser: 0.56 mg/dL (ref 0.44–1.00)
GFR, Estimated: >60 mL/min (ref >60)
Glucose, Bld: 101 mg/dL — ABNORMAL HIGH (ref 70–99)
Potassium: 3.7 mmol/L (ref 3.5–5.1)
Sodium: 139 mmol/L (ref 135–145)

## 2021-12-04 LAB — HEAVY METALS, BLOOD
Arsenic: 2 ug/L (ref 0–9)
Lead: 1.5 ug/dL (ref 0.0–3.4)
Mercury: 1.9 ug/L (ref 0.0–14.9)

## 2021-12-04 LAB — OLIGOCLONAL BANDS, CSF + SERM

## 2021-12-04 MED ORDER — ASENAPINE MALEATE 5 MG SL SUBL
5.0000 mg | SUBLINGUAL_TABLET | Freq: Once | SUBLINGUAL | Status: AC
Start: 2021-12-04 — End: 2021-12-04
  Administered 2021-12-04: 5 mg via SUBLINGUAL
  Filled 2021-12-04: qty 1

## 2021-12-04 NOTE — Plan of Care (Signed)
  Problem: Clinical Measurements: Goal: Respiratory complications will improve Outcome: Progressing Goal: Cardiovascular complication will be avoided Outcome: Progressing   Problem: Activity: Goal: Risk for activity intolerance will decrease Outcome: Progressing   Problem: Nutrition: Goal: Adequate nutrition will be maintained Outcome: Progressing   

## 2021-12-04 NOTE — Care Management Important Message (Signed)
Important Message ? ?Patient Details  ?Name: Teresa Sherman ?MRN: [id number] ?Date of Birth: 1946/03/09 ? ? ?Medicare Important Message Given:  Yes ? ? ? ? ?[name] ?12/04/2021, 12:32 PM ?

## 2021-12-04 NOTE — Progress Notes (Signed)
Occupational Therapy Treatment ?Patient Details ?Name: Teresa Sherman ?MRN: [id number] ?DOB: 31-Jan-1946 ?Today's Date: 12/04/2021 ? ? ?History of present illness Teresa Sherman is a 76 year old female with history of stroke, hyperlipidemia, insomnia, who presents to the emergency department for chief concerns of altered mental status. ?  ?OT comments ? Pt agreeable to brief OT tx. Pt endorsing fatigue but with encouragement completed bed mobility with CGA-MIN A to come to EOB. MIN A to support LLE while attempting to don her sock, requiring increased time/effort. Handheld assist to stand and ambulate to the sink for standing grooming tasks. Pt endorsing fatigue and requesting to return to bed. Pt progressing, limited some by fatigue this afternoon. Continue to recommend Many.   ? ?Recommendations for follow up therapy are one component of a multi-disciplinary discharge planning process, led by the attending physician.  Recommendations may be updated based on patient status, additional functional criteria and insurance authorization. ?   ?Follow Up Recommendations ? Home health OT  ?  ?Assistance Recommended at Discharge Frequent or constant Supervision/Assistance  ?Patient can return home with the following ? A little help with walking and/or transfers;A little help with bathing/dressing/bathroom;Help with stairs or ramp for entrance;Assist for transportation;Assistance with cooking/housework;Direct supervision/assist for financial management;Direct supervision/assist for medications management ?  ?Equipment Recommendations ? None recommended by OT  ?  ?Recommendations for Other Services   ? ?  ?Precautions / Restrictions Precautions ?Precautions: Fall ?Restrictions ?Weight Bearing Restrictions: No  ? ? ?  ? ?Mobility Bed Mobility ?Overal bed mobility: Needs Assistance ?Bed Mobility: Supine to Sit, Sit to Supine ?  ?  ?Supine to sit: Min assist ?Sit to supine: Supervision ?  ?  ?  ? ?Transfers ?Overall  transfer level: Needs assistance ?Equipment used: 1 person hand held assist ?Transfers: Sit to/from Stand ?Sit to Stand: Min guard ?  ?  ?  ?  ?  ?  ?  ?  ?Balance Overall balance assessment: Needs assistance ?Sitting-balance support: Feet supported ?Sitting balance-Leahy Scale: Fair ?  ?  ?Standing balance support: Single extremity supported, During functional activity ?Standing balance-Leahy Scale: Fair ?  ?  ?  ?  ?  ?  ?  ?  ?  ?  ?  ?  ?   ? ?ADL either performed or assessed with clinical judgement  ? ?ADL Overall ADL's : Needs assistance/impaired ?  ?  ?Grooming: Standing;Min guard;Set up;Supervision/safety ?Grooming Details (indicate cue type and reason): SBA-CGA For standing grooming tasks at the sink ?  ?  ?  ?  ?  ?  ?Lower Body Dressing: Sitting/lateral leans;Minimal assistance ?Lower Body Dressing Details (indicate cue type and reason): MIN A to support LLE while in sitting to don sock with noted difficulty and increased time/effort to complete ?  ?  ?  ?  ?  ?  ?Functional mobility during ADLs: Min guard ?  ?  ? ?Extremity/Trunk Assessment   ?  ?  ?  ?  ?  ? ?Vision   ?  ?  ?Perception   ?  ?Praxis   ?  ? ?Cognition Arousal/Alertness: Awake/alert ?Behavior During Therapy: Impulsive ?Overall Cognitive Status: Impaired/Different from baseline ?Area of Impairment: Orientation, Attention, Memory, Following commands, Safety/judgement, Awareness, Problem solving ?  ?  ?  ?  ?  ?  ?  ?  ?Orientation Level: Time, Situation, Place ?Current Attention Level: Focused ?Memory: Decreased recall of precautions, Decreased short-term memory ?Following Commands: Follows one step commands consistently, Follows multi-step commands  inconsistently, Follows one step commands with increased time ?Safety/Judgement: Decreased awareness of safety, Decreased awareness of deficits ?Awareness: Intellectual ?Problem Solving: Slow processing, Difficulty sequencing, Requires verbal cues, Requires tactile cues ?General Comments:  hallucinations but easily redirectable ?  ?  ?   ?Exercises   ? ?  ?Shoulder Instructions   ? ? ?  ?General Comments    ? ? ?Pertinent Vitals/ Pain       Pain Assessment ?Pain Assessment: No/denies pain ? ?Home Living   ?  ?  ?  ?  ?  ?  ?  ?  ?  ?  ?  ?  ?  ?  ?  ?  ?  ?  ? ?  ?Prior Functioning/Environment    ?  ?  ?  ?   ? ?Frequency ? Min 3X/week  ? ? ? ? ?  ?Progress Toward Goals ? ?OT Goals(current goals can now be found in the care plan section) ? Progress towards OT goals: Progressing toward goals ? ?Acute Rehab OT Goals ?Patient Stated Goal: to get stronger ?OT Goal Formulation: With patient/family ?Time For Goal Achievement: 12/15/21 ?Potential to Achieve Goals: Good  ?Plan Frequency remains appropriate;Discharge plan remains appropriate   ? ?Co-evaluation ? ? ?   ?  ?  ?  ?  ? ?  ?AM-PAC OT "6 Clicks" Daily Activity     ?Outcome Measure ? ? Help from another person eating meals?: None ?Help from another person taking care of personal grooming?: None ?Help from another person toileting, which includes using toliet, bedpan, or urinal?: A Little ?Help from another person bathing (including washing, rinsing, drying)?: A Little ?Help from another person to put on and taking off regular upper body clothing?: None ?Help from another person to put on and taking off regular lower body clothing?: A Little ?6 Click Score: 21 ? ?  ?End of Session   ? ?OT Visit Diagnosis: Unsteadiness on feet (R26.81);Repeated falls (R29.6);Muscle weakness (generalized) (M62.81) ?  ?Activity Tolerance Patient tolerated treatment well ?  ?Patient Left in bed;with call bell/phone within reach;with bed alarm set ?  ?Nurse Communication   ?  ? ?   ? ?Time: 7408-1448 ?OT Time Calculation (min): 9 min ? ?Charges: OT General Charges ?$OT Visit: 1 Visit ?OT Treatments ?$Self Care/Home Management : 8-22 mins ? ?Ardeth Perfect., MPH, MS, OTR/L ?ascom [phone] ?12/04/21, 4:22 PM ?

## 2021-12-04 NOTE — Progress Notes (Signed)
Mobility Specialist - Progress Note ? ? 12/04/21 1400  ?Mobility  ?Activity Ambulated with assistance in hallway;Stood at bedside;Dangled on edge of bed  ?Level of Assistance Contact guard assist, steadying assist  ?Assistive Device None  ?Activity Response Tolerated well  ?$Mobility charge 1 Mobility  ? ? ?Pt supine upon arrival using RA with family at bedside. Pt sits EOB with ModI and HHA for UE strength. Completes STS with MinA d/t instability --- 1 LOB noted upon standing. CGA to ambulate 2 laps around nursing station. Hallucinations throughout but easy to redirect. Pt returns to supine with needs in reach, alarm set and family at bedside. ? ?Teresa Sherman ?Mobility Specialist ?12/04/21, 2:19 PM ? ? ? ? ?

## 2021-12-04 NOTE — TOC Progression Note (Signed)
Transition of Care (TOC) - Progression Note  ? ? ?Patient Details  ?Name: Teresa Sherman ?MRN: [id number] ?Date of Birth: 06-08-1946 ? ?Transition of Care (TOC) CM/SW Contact  ?Pete Pelt, RN ?Phone Number: ?12/04/2021, 4:10 PM ? ?Clinical Narrative:   Correction:  Patient has [facility] home health.  They will service patient for home health needs. ? ? ? ?Expected Discharge Plan: [place] ?Barriers to Discharge: Continued Medical Work up ? ?Expected Discharge Plan and Services ?Expected Discharge Plan: [place] ?  ?  ?  ?Living arrangements for the past 2 months: [place] ?                ?  ?  ?  ?  ?  ?  ?[place] Agency: [facility] ?Date HH Agency Contacted: 12/04/21 ?Time HH Agency Contacted: 0900 ?Representative spoke with at [place]: Judson Roch ? ? ?Social Determinants of Health (SDOH) Interventions ?  ? ?Readmission Risk Interventions ?   ? View : No data to display.  ?  ?  ?  ? ? ?

## 2021-12-04 NOTE — Plan of Care (Signed)
?  Problem: Clinical Measurements: Goal: Will remain free from infection Outcome: Progressing   Problem: Clinical Measurements: Goal: Diagnostic test results will improve Outcome: Progressing   Problem: Nutrition: Goal: Adequate nutrition will be maintained Outcome: Progressing   Problem: Coping: Goal: Level of anxiety will decrease Outcome: Progressing   

## 2021-12-04 NOTE — Progress Notes (Signed)
?PROGRESS NOTE ? ?Teresa Sherman HBZ:[id number] DOB: 04/10/1946 DOA: 11/29/2021 ?PCP: System, Provider Not In ? ? LOS: 4 days  ? ?Brief Narrative / Interim history: ?Teresa Sherman is a 76 year old female with history of stroke, hyperlipidemia, insomnia, who presents to the emergency department for chief concerns of altered mental status and aphasia.  She was initially weak on the right side.  COVID/influenza A/influenza B PCR were negative.  CT scan of the brain unremarkable.  Neurology consulted and she was admitted to the hospital.  She has a history of a stroke in October 2022 but after that she returned to baseline. ? ?Subjective / 24h Interval events: ?Quite confused this morning.  Points to the sprinkler on the ceiling and tells me that its a bug flapping its wings.  No other complaints ? ?Assesement and Plan: ?Principal Problem: ?  Altered mental status ?Active Problems: ?  Depression with anxiety ?  Disordered sleep ?  Hyperlipidemia ?  History of stroke ? ?Assessment and Plan: ?Principal problem ?Altered mental status, aphasia, right upper extremity weakness -neurology consulted and following.  Discussed with Dr. Quinn Axe.  She underwent imaging of the brain which was fairly unremarkable.  She was placed on broad-spectrum antibiotics and underwent an LP on 4/11, findings not consistent with bacterial meningitis and antibacterials were discontinued 4/12.  HSV PCR was negative and antivirals were discontinued as well.  TSH was unremarkable.  VDRL negative.  Currently oligoclonal bands are pending, meningitis/encephalitis panel pending.  She looks like she is getting worse in terms of her confusion and hallucinations, more profound in the last 24 hours.  Sent a B1 level.   ?-Of note, patient's daughter tells me that the patient has a phobia of bugs and apparently she is using excessive amounts of bug sprays in the home raising concern about a toxidrome, but I would have expected her to improve while  here not get worse in the last couple of days ?-Consulted psychiatry as well to ensure she does not have any underlying psychiatric illnesses. ? ?Active problems ?History of stroke -continue aspirin, atorvastatin ? ?Hyperlipidemia -continue statin ? ?Disordered sleep -on trazodone ? ? ? ?Scheduled Meds: ?  stroke: early stages of recovery book   Does not apply Once  ? aspirin  81 mg Oral Daily  ? atorvastatin  40 mg Oral Daily  ? cyanocobalamin  1,000 mcg Intramuscular Once per day on Mon Wed Fri  ? enoxaparin (LOVENOX) injection  40 mg Subcutaneous QHS  ? mouth rinse  15 mL Mouth Rinse BID  ? melatonin  5 mg Oral QHS  ? thiamine  100 mg Oral Daily  ? traZODone  50 mg Oral QHS  ? ?Continuous Infusions: ? sodium chloride Stopped (12/04/21 1017)  ? ?PRN Meds:.acetaminophen, aspirin-acetaminophen-caffeine, senna-docusate ? ?Diet Orders (From admission, onward)  ? ?  Start     Ordered  ? 12/01/21 1527  Diet regular Room service appropriate? Yes; Fluid consistency: Thin  Diet effective now       ?Question Answer Comment  ?Room service appropriate? Yes   ?Fluid consistency: Thin   ?  ? 12/01/21 1527  ? ?  ?  ? ?  ? ? ?DVT prophylaxis: enoxaparin (LOVENOX) injection 40 mg Start: 11/30/21 2200 ?SCD's Start: 11/29/21 1834 ? ? ?Lab Results  ?Component Value Date  ? PLT 193 12/03/2021  ? ? ?  Code Status: DNR ? ?Family Communication: Discussed with daughter over the phone ? ?Status is: Inpatient ? ?Remains inpatient appropriate because:  Worsening hallucinations. ? ?Level of care: Med-Surg ? ?Consultants:  ?Neurology ? ?Procedures:  ?LP ? ?Objective: ?Vitals:  ? 12/03/21 1625 12/04/21 0459 12/04/21 0739 12/04/21 0755  ?BP: (!) 137/91 (!) 138/92 (!) 126/97   ?Pulse: 95 91 (!) 121 (!) 102  ?Resp: '19 18 19   '$ ?Temp: (!) 97.5 ?F (36.4 ?C) 97.8 ?F (36.6 ?C) 98 ?F (36.7 ?C)   ?TempSrc: Oral     ?SpO2: 95%  96%   ?Weight:      ?Height:      ? ? ?Intake/Output Summary (Last 24 hours) at 12/04/2021 1044 ?Last data filed at 12/04/2021  1015 ?Gross per 24 hour  ?Intake 316.34 ml  ?Output --  ?Net 316.34 ml  ? ? ?Wt Readings from Last 3 Encounters:  ?11/29/21 61.2 kg  ?02/15/19 56.7 kg  ?10/22/18 56.7 kg  ? ? ?Examination: ? ?Constitutional: Confused ?Eyes: lids and conjunctivae normal, no scleral icterus ?ENMT: mmm ?Neck: normal, supple ?Respiratory: clear to auscultation bilaterally, no wheezing, no crackles. Normal respiratory effort.  ?Cardiovascular: Regular rate and rhythm, no murmurs / rubs / gallops. No LE edema. ?Abdomen: soft, no distention, no tenderness. Bowel sounds positive.  ?Skin: no rashes ?Neurologic: No new deficits ? ?Data Reviewed: I have independently reviewed following labs and imaging studies  ? ?CBC ?Recent Labs  ?Lab 11/29/21 ?1615 11/30/21 ?0906 12/01/21 ?6301 12/02/21 ?0403 12/03/21 ?0356  ?WBC 9.0 16.0* 7.7 6.8 7.6  ?HGB 14.3 13.8 12.8 11.4* 11.9*  ?HCT 42.4 40.4 37.4 33.8* 35.5*  ?PLT 215 201 175 187 193  ?MCV 87.8 87.6 88.2 88.3 89.6  ?MCH 29.6 29.9 30.2 29.8 30.1  ?MCHC 33.7 34.2 34.2 33.7 33.5  ?RDW 14.6 14.6 14.6 14.9 14.7  ?LYMPHSABS 2.4  --   --   --   --   ?MONOABS 0.9  --   --   --   --   ?EOSABS 0.0  --   --   --   --   ?BASOSABS 0.0  --   --   --   --   ? ? ? ?Recent Labs  ?Lab 11/29/21 ?1444 11/29/21 ?1615 11/29/21 ?1615 11/29/21 ?1631 11/29/21 ?2039 11/30/21 ?0421 11/30/21 ?6010 12/01/21 ?9323 12/02/21 ?0403 12/03/21 ?5573 12/04/21 ?0402  ?NA  --  132*   < >  --   --   --  131* 136 139 139 139  ?K  --  4.1   < >  --   --   --  3.4* 3.3* 3.1* 3.3* 3.7  ?CL  --  98   < >  --   --   --  101 105 108 109 106  ?CO2  --  24   < >  --   --   --  20* '23 25 24 25  '$ ?GLUCOSE  --  105*   < >  --   --   --  86 95 83 95 101*  ?BUN  --  20   < >  --   --   --  '18 13 9 13 18  '$ ?CREATININE  --  0.70   < >  --   --   --  0.55 0.49 0.46 0.61 0.56  ?CALCIUM  --  9.0   < >  --   --   --  8.2* 7.9* 8.1* 8.8* 9.3  ?AST  --  27  --   --   --   --   --   --   --   --   --   ?  ALT  --  28  --   --   --   --   --   --   --   --   --    ?ALKPHOS  --  121  --   --   --   --   --   --   --   --   --   ?BILITOT  --  1.6*  --   --   --   --   --   --   --   --   --   ?ALBUMIN  --  3.8  --   --   --   --   --   --   --   --   --   ?MG  --   --   --   --   --   --   --   --  1.9  --   --   ?PROCALCITON  --   --   --  <0.10  --   --   --   --   --   --   --   ?INR 1.1  --   --   --   --   --   --   --   --   --   --   ?TSH  --  1.475  --   --   --   --   --   --   --   --   --   ?HGBA1C  --   --   --   --   --  5.4  --   --   --   --   --   ?AMMONIA  --   --   --   --  19  --   --   --   --   --   --   ? < > = values in this interval not displayed.  ? ? ? ?------------------------------------------------------------------------------------------------------------------ ?No results for input(s): CHOL, HDL, LDLCALC, TRIG, CHOLHDL, LDLDIRECT in the last 72 hours. ? ? ?Lab Results  ?Component Value Date  ? HGBA1C 5.4 11/30/2021  ? ?------------------------------------------------------------------------------------------------------------------ ?No results for input(s): TSH, T4TOTAL, T3FREE, THYROIDAB in the last 72 hours. ? ?Invalid input(s): FREET3 ? ? ?Cardiac Enzymes ?No results for input(s): CKMB, TROPONINI, MYOGLOBIN in the last 168 hours. ? ?Invalid input(s): CK ?------------------------------------------------------------------------------------------------------------------ ?No results found for: BNP ? ?CBG: ?No results for input(s): GLUCAP in the last 168 hours. ? ?Recent Results (from the past 240 hour(s))  ?Resp Panel by RT-PCR (Flu A&B, Covid) Nasopharyngeal Swab     Status: None  ? Collection Time: 11/29/21  2:44 PM  ? Specimen: Nasopharyngeal Swab; Nasopharyngeal(NP) swabs in vial transport medium  ?Result Value Ref Range Status  ? SARS Coronavirus 2 by RT PCR NEGATIVE NEGATIVE Final  ?  Comment: (NOTE) ?SARS-CoV-2 target nucleic acids are NOT DETECTED. ? ?The SARS-CoV-2 RNA is generally detectable in upper respiratory ?specimens during the  acute phase of infection. The lowest ?concentration of SARS-CoV-2 viral copies this assay can detect is ?138 copies/mL. A negative result does not preclude SARS-Cov-2 ?infection and should not be used as the sole

## 2021-12-04 NOTE — Progress Notes (Signed)
Physical Therapy Treatment ?Patient Details ?Name: Teresa Sherman ?MRN: [id number] ?DOB: June 06, 1946 ?Today's Date: 12/04/2021 ? ? ?History of Present Illness [name] is a 76 year old female with history of stroke, hyperlipidemia, insomnia, who presents to the emergency department for chief concerns of altered mental status. ? ?  ?PT Comments  ? ? Pt was long sitting in bed with supportive daughter at bedside. Pt is alert but only oriented to self. Was able to consistently follow one step commands but also continues to have hallucinations. She reports seeing things that are not present but is easily redirected. No agitation or severe anxiety throughout. She continues to move well from an acute PT standpoint. Recommend HHPT to address higher level balance deficits. ?    ?Recommendations for follow up therapy are one component of a multi-disciplinary discharge planning process, led by the attending physician.  Recommendations may be updated based on patient status, additional functional criteria and insurance authorization. ? ?Follow Up Recommendations ? Home health PT ?  ?  ?Assistance Recommended at Discharge Frequent or constant Supervision/Assistance  ?Patient can return home with the following A little help with walking and/or transfers;A little help with bathing/dressing/bathroom ?  ?Equipment Recommendations ? None recommended by PT  ?  ?   ?Precautions / Restrictions Precautions ?Precautions: Fall ?Restrictions ?Weight Bearing Restrictions: No  ?  ? ?Mobility ? Bed Mobility ?Overal bed mobility: Needs Assistance ?Bed Mobility: Supine to Sit, Sit to Supine ?  ?  ?Supine to sit: Supervision ?Sit to supine: Supervision ?  ?  ?  ? ?Transfers ?Overall transfer level: Needs assistance ?Equipment used: 1 person hand held assist, None ?Transfers: Sit to/from Stand ?Sit to Stand: Min guard, Supervision ?  ?  ?  ?  ?  ?General transfer comment: CGA progressing to supervision ?   ? ?Ambulation/Gait ?Ambulation/Gait assistance: Min guard, Supervision ?Gait Distance (Feet): 200 Feet ?Assistive device: 1 person hand held assist, None ?Gait Pattern/deviations: Step-through pattern, Decreased stride length ?Gait velocity: decreased ?  ?  ?General Gait Details: pt started to ambulate with +1 LUE HHA however rogressed quickly to no UE support. no LOB however pt is easily distracted in hallway and needs vcs for staying focused on desired task requested of her. ? ?  ?Balance Overall balance assessment: Needs assistance ?Sitting-balance support: Feet supported ?Sitting balance-Leahy Scale: Good ?  ?  ?Standing balance support: No upper extremity supported ?Standing balance-Leahy Scale: Fair ?  ?   ?Cognition Arousal/Alertness: Awake/alert ?Behavior During Therapy: Impulsive ?Overall Cognitive Status: Impaired/Different from baseline ?Area of Impairment: Orientation, Attention, Memory, Following commands, Safety/judgement, Awareness, Problem solving ?  ? Orientation Level: Time, Situation, Place ?Current Attention Level: Focused ?Memory: Decreased recall of precautions, Decreased short-term memory ?Following Commands: Follows one step commands consistently, Follows multi-step commands inconsistently, Follows one step commands with increased time ?Safety/Judgement: Decreased awareness of safety, Decreased awareness of deficits ?Awareness: Intellectual ?Problem Solving: Slow processing, Difficulty sequencing, Requires verbal cues, Requires tactile cues ?General Comments: pt is alert but disoriented. Continues to have hullucinations and is not at baseline cognition per supportive daughter. ?  ?  ? ?  ?   ?General Comments General comments (skin integrity, edema, etc.): discussed with daughter, MD, and opatient about POC and what is being done for cognition concerns. Chief Strategy Officer discussed with daughter that pt is still doing really well from a PT/mobility standpoint ?  ?  ? ?Pertinent Vitals/Pain Pain  Assessment ?Pain Assessment: No/denies pain  ? ? ? ?PT Goals (current goals can  now be found in the care plan section) Acute Rehab PT Goals ?Patient Stated Goal: none stated ?Progress towards PT goals: Progressing toward goals ? ?  ?Frequency ? ? ? 7X/week ? ? ? ?  ?PT Plan Current plan remains appropriate  ? ? ?   ?AM-PAC PT "6 Clicks" Mobility   ?Outcome Measure ? Help needed turning from your back to your side while in a flat bed without using bedrails?: A Little ?Help needed moving from lying on your back to sitting on the side of a flat bed without using bedrails?: A Little ?Help needed moving to and from a bed to a chair (including a wheelchair)?: A Little ?Help needed standing up from a chair using your arms (e.g., wheelchair or bedside chair)?: A Little ?Help needed to walk in hospital room?: A Little ?Help needed climbing 3-5 steps with a railing? : A Little ?6 Click Score: 18 ? ?  ?End of Session   ?Activity Tolerance: Patient tolerated treatment well ?Patient left: in bed;with call bell/phone within reach;with bed alarm set;with family/visitor present ?Nurse Communication: Mobility status ?PT Visit Diagnosis: Unsteadiness on feet (R26.81);Other abnormalities of gait and mobility (R26.89);Muscle weakness (generalized) (M62.81);Difficulty in walking, not elsewhere classified (R26.2);Hemiplegia and hemiparesis ?Hemiplegia - Right/Left: Right ?Hemiplegia - dominant/non-dominant: Dominant ?Hemiplegia - caused by: Unspecified ?  ? ? ?Time: 6811-5726 ?PT Time Calculation (min) (ACUTE ONLY): 10 min ? ?Charges:  $Gait Training: 8-22 mins          ?          ?Julaine Fusi PTA ?12/04/21, 10:39 AM  ? ?
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
## 2021-12-04 NOTE — TOC Initial Note (Signed)
Transition of Care (TOC) - Initial/Assessment Note  ? ? ?Patient Details  ?Name: Teresa Sherman ?MRN: [id number] ?Date of Birth: 1946/05/06 ? ?Transition of Care (TOC) CM/SW Contact:    ?Pete Pelt, RN ?Phone Number: ?12/04/2021, 12:58 PM ? ?Clinical Narrative:      Patient very pleasant but confused.  Family at bedside.  Initial recommendation was CIR, but patient was upgraded to home health.  [facility] to serve patient as per Judson Roch at agency.  No DME recommendation for patient.  TOC to follow if any additional needs.         ? ? ?Expected Discharge Plan: [place] ?Barriers to Discharge: Continued Medical Work up ? ? ?Patient Goals and CMS Choice ?  ?  ?Choice offered to / list presented to : NA ? ?Expected Discharge Plan and Services ?Expected Discharge Plan: [place] ?  ?  ?  ?Living arrangements for the past 2 months: [place] ?                ?  ?  ?  ?  ?  ?  ?[place] Agency: [facility] ?Date HH Agency Contacted: 12/04/21 ?Time HH Agency Contacted: 0900 ?Representative spoke with at [facility]: Judson Roch ? ?Prior Living Arrangements/Services ?Living arrangements for the past 2 months: [place] ?  ?Patient language and need for interpreter reviewed:: Yes ?Do you feel safe going back to the place where you live?: Yes      ?Need for Family Participation in Patient Care: Yes (Comment) ?Care giver support system in place?: Yes (comment) ?  ?Criminal Activity/Legal Involvement Pertinent to Current Situation/Hospitalization: No - Comment as needed ? ?Activities of Daily Living ?Home Assistive Devices/Equipment: None ?ADL Screening (condition at time of admission) ?Patient's cognitive ability adequate to safely complete daily activities?: No ?Is the patient deaf or have difficulty hearing?: No ?Does the patient have difficulty seeing, even when wearing glasses/contacts?: Yes ?Does the patient have difficulty concentrating, remembering, or  making decisions?: Yes ?Patient able to express need for assistance with ADLs?: Yes ?Does the patient have difficulty dressing or bathing?: No ?Independently performs ADLs?: Yes (appropriate for developmental age) ?Does the patient have difficulty walking or climbing stairs?: No ?Weakness of Legs: Right ?Weakness of Arms/Hands: Right ? ?Permission Sought/Granted ?Permission sought to share information with : Case Manager ?Permission granted to share information with : Yes, Verbal Permission Granted ?   ? Permission granted to share info w AGENCY: [place] ?   ?   ? ?Emotional Assessment ?Appearance:: Appears stated age ?Attitude/Demeanor/Rapport: Gracious (confused and hallucinating at times) ?Affect (typically observed): Pleasant ?Orientation: : Oriented to Self ?Alcohol / Substance Use: Not Applicable ?Psych Involvement: No (comment) ? ?Admission diagnosis:  Altered mental status [R41.82] ?Altered mental status, unspecified altered mental status type [R41.82] ?Patient Active Problem List  ? Diagnosis Date Noted  ? Altered mental status 11/29/2021  ? Hyperlipidemia 11/29/2021  ? History of stroke 11/29/2021  ? Fibrocystic breast 01/26/2015  ? History of colon polyps 01/26/2015  ? Migraine aura without headache 01/26/2015  ? Combined fat and carbohydrate induced hyperlipemia 01/26/2015  ? Encounter for aftercare for long-term (current) use of antibiotics 01/26/2015  ? Neoplasm of uncertain behavior of skin 01/26/2015  ? Colonic constipation 01/26/2015  ? Disordered sleep 03/06/2014  ? Spinal stenosis of lumbar region 07/05/2013  ? Anxiety state, unspecified 07/05/2013  ? Depression with anxiety 07/05/2013  ? Unspecified constipation 07/05/2013  ?  Gait instability 07/05/2013  ? Fusion of spine of lumbar region 07/05/2013  ? ?PCP:  System, Provider Not In ?Pharmacy:   ?[id number] [address] [place] [address]?Phone: [phone]:  [phone]? ? ? ? ?Social Determinants of Health (SDOH) Interventions ?  ? ?Readmission Risk Interventions ?   ? View : No data to display.  ?  ?  ?  ? ? ? ?

## 2021-12-04 NOTE — Progress Notes (Signed)
Inpatient Rehab Admissions Coordinator:  ? ?Late entry:  ?Note updated therapy recommendations to [facility]  Will sign off for CIR.  ? ?Shann Medal, PT, DPT ?Admissions Coordinator ?[phone] ?12/04/21  ?8:40 AM ? ?

## 2021-12-05 ENCOUNTER — Inpatient Hospital Stay: Payer: Medicare HMO

## 2021-12-05 DIAGNOSIS — R4182 Altered mental status, unspecified: Secondary | ICD-10-CM | POA: Diagnosis not present

## 2021-12-05 LAB — CBC
HCT: 40.6 % (ref 36.0–46.0)
Hemoglobin: 13.7 g/dL (ref 12.0–15.0)
MCH: 30.1 pg (ref 26.0–34.0)
MCHC: 33.7 g/dL (ref 30.0–36.0)
MCV: 89.2 fL (ref 80.0–100.0)
Platelets: 260 10*3/uL (ref 150–400)
RBC: 4.55 MIL/uL (ref 3.87–5.11)
RDW: 15.4 % (ref 11.5–15.5)
WBC: 17.2 10*3/uL — ABNORMAL HIGH (ref 4.0–10.5)
nRBC: 0 % (ref 0.0–0.2)

## 2021-12-05 LAB — URINALYSIS, ROUTINE W REFLEX MICROSCOPIC
Bacteria, UA: NONE SEEN
Bilirubin Urine: NEGATIVE
Glucose, UA: NEGATIVE mg/dL
Ketones, ur: NEGATIVE mg/dL
Leukocytes,Ua: NEGATIVE
Nitrite: NEGATIVE
Protein, ur: NEGATIVE mg/dL
Specific Gravity, Urine: 1.016 (ref 1.005–1.030)
pH: 6 (ref 5.0–8.0)

## 2021-12-05 LAB — COMPREHENSIVE METABOLIC PANEL
ALT: 31 U/L (ref 0–44)
AST: 27 U/L (ref 15–41)
Albumin: 3.6 g/dL (ref 3.5–5.0)
Alkaline Phosphatase: 89 U/L (ref 38–126)
Anion gap: 10 (ref 5–15)
BUN: 25 mg/dL — ABNORMAL HIGH (ref 8–23)
CO2: 25 mmol/L (ref 22–32)
Calcium: 9 mg/dL (ref 8.9–10.3)
Chloride: 102 mmol/L (ref 98–111)
Creatinine, Ser: 0.68 mg/dL (ref 0.44–1.00)
GFR, Estimated: >60 mL/min (ref >60)
Glucose, Bld: 116 mg/dL — ABNORMAL HIGH (ref 70–99)
Potassium: 3.8 mmol/L (ref 3.5–5.1)
Sodium: 137 mmol/L (ref 135–145)
Total Bilirubin: 0.8 mg/dL (ref 0.3–1.2)
Total Protein: 7.1 g/dL (ref 6.5–8.1)

## 2021-12-05 LAB — EXPECTORATED SPUTUM ASSESSMENT W GRAM STAIN, RFLX TO RESP C

## 2021-12-05 LAB — CULTURE, BLOOD (ROUTINE X 2)
Culture: NO GROWTH
Culture: NO GROWTH
Special Requests: ADEQUATE
Special Requests: ADEQUATE

## 2021-12-05 IMAGING — DX DG CHEST 1V PORT
1 series · 1 of 1 positions shown · non-contrast
Comparison: [DATE]

CLINICAL DATA: Leukocytosis.

EXAM:
PORTABLE CHEST 1 VIEW

[chest ap]
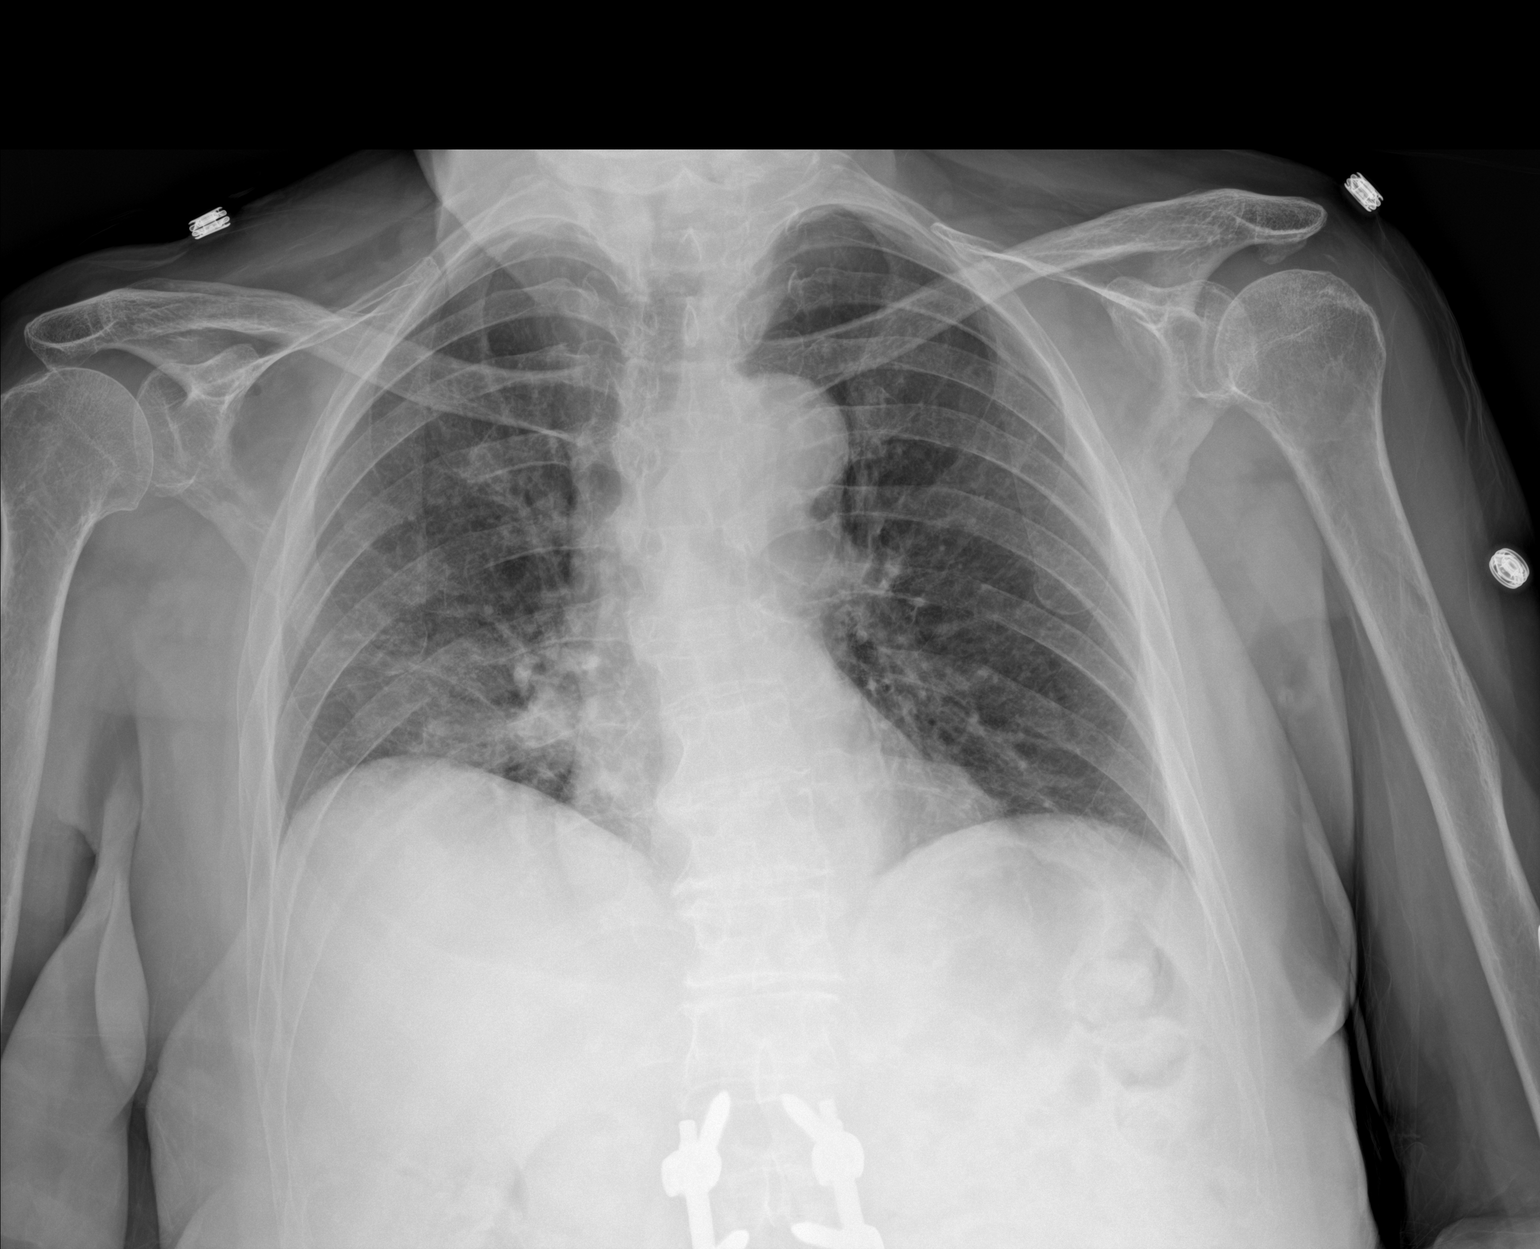

[1 of 1 positions shown; findings below may reference images not displayed]

FINDINGS: Heart size and mediastinal contours appear normal. New asymmetric
opacity within the right base is identified compatible with
atelectasis versus infiltrate. Left lung appears clear. No signs of
pleural effusion or edema.
IMPRESSION: Asymmetric opacity within the right base compatible with atelectasis
versus infiltrate.

## 2021-12-05 MED ORDER — TRAZODONE HCL 50 MG PO TABS
25.0000 mg | ORAL_TABLET | Freq: Every day | ORAL | Status: DC
  Administered 2021-12-05 – 2021-12-07 (×3): 25 mg via ORAL
  Filled 2021-12-05 (×3): qty 1

## 2021-12-05 MED ORDER — AMOXICILLIN-POT CLAVULANATE 875-125 MG PO TABS
1.0000 | ORAL_TABLET | Freq: Two times a day (BID) | ORAL | Status: DC
  Administered 2021-12-05 – 2021-12-08 (×7): 1 via ORAL
  Filled 2021-12-05 (×7): qty 1

## 2021-12-05 MED ORDER — GUAIFENESIN 100 MG/5ML PO LIQD
5.0000 mL | ORAL | Status: DC | PRN
  Administered 2021-12-05 – 2021-12-08 (×7): 5 mL via ORAL
  Filled 2021-12-05 (×8): qty 10

## 2021-12-05 NOTE — Progress Notes (Signed)
Physical Therapy Treatment ?Patient Details ?Name: Teresa Sherman ?MRN: [id number] ?DOB: 1946-01-23 ?Today's Date: 12/05/2021 ? ? ?History of Present Illness [name] is a 76 year old female with history of stroke, hyperlipidemia, insomnia, who presents to the emergency department for chief concerns of altered mental status. ? ?  ?PT Comments  ? ? PT attempted to see pt several times in the morning however pt too lethargic/sleeping to safely participate. Upon arriving this session, pt was sitting up in recliner with sitter and supportive daughter present. Pt was struggling to stay awake but was able to be alert enough to participate. Overall pt is much more lethargic throughout session with minimal conversation. She was talkative previous date's session however was also hallucinating at times. No hallucinations observed this date. Author questions if pt is experiencing lingering affects of sleep aide previous night. Mobility wise, pt continues to be able to stand and ambulate one lap around RN station. Ambulation started with +1 HHA that progressed to no AD.Author did notice slightly decreased LLE foot clearance during swing phase that will need to continued to be monitors. May be due to increased lethargy today. Pt has some unsteadiness however no therapist intervention required. Author does recommend pt use RW until she returns to PLOF. Pts daughter ask if pt would qualify for rehab. Author explained that pt is moving too well from a mobility standpoint and does not feel it would be very beneficial. She is looking into hired aide for home. Pt will however benefit from HHPT to improve balance, strength, and independence with all ADLs. Acute therapy will continue to follow and progress as able per current POC. ?  ?Recommendations for follow up therapy are one component of a multi-disciplinary discharge planning process, led by the attending physician.  Recommendations may be updated based on patient  status, additional functional criteria and insurance authorization. ? ?Follow Up Recommendations ? Home health PT ?  ?  ?Assistance Recommended at Discharge Frequent or constant Supervision/Assistance  ?Patient can return home with the following A little help with walking and/or transfers;A little help with bathing/dressing/bathroom ?  ?Equipment Recommendations ? None recommended by PT  ?  ?Recommendations for Other Services   ? ? ?  ?Precautions / Restrictions Precautions ?Precautions: Fall ?Restrictions ?Weight Bearing Restrictions: No  ?  ? ?Mobility ? Bed Mobility ?  ?  ?  ?  ?  ?  ?  ?General bed mobility comments: in recliner pre/post session. ?  ? ?Transfers ?Overall transfer level: Needs assistance ?Equipment used: 1 person hand held assist ?Transfers: Sit to/from Stand ?Sit to Stand: Min guard ?  ?  ?  ?  ?  ?General transfer comment: pt stood with RUE HHA. CGA for safety. author discussed recommendation for pt to use RW at home until all need acute symptoms are resolved ?  ? ?Ambulation/Gait ?Ambulation/Gait assistance: Min guard ?Gait Distance (Feet): 200 Feet ?Assistive device: 1 person hand held assist, None ?Gait Pattern/deviations: Step-through pattern, Decreased stride length ?Gait velocity: decreased ?  ?  ?General Gait Details: pt ambulated half a lap with +1 HHA and half a lap without UE support. she tends to have poor R foot clearance during swing phase however still no LOB or safety concerns during gait. lethargy most limiting pt today ? ? ?Stairs ?  ?  ?  ?  ?  ? ? ?Wheelchair Mobility ?  ? ?Modified Rankin (Stroke Patients Only) ?  ? ? ?  ?Balance Overall balance assessment: Needs assistance ?Sitting-balance  support: Feet supported ?Sitting balance-Leahy Scale: Good ?  ?  ?Standing balance support: Single extremity supported, During functional activity ?Standing balance-Leahy Scale: Fair ?  ?  ?  ?  ?  ?  ?  ?  ?  ?  ?  ?  ?  ? ?  ?Cognition Arousal/Alertness: Lethargic, Suspect due to  medications (pt was sound asleep up until ~ 11am. was given medicine to aide with sleep previous night  and author feels there are lingering affects.) ?Behavior During Therapy: Flat affect ?Overall Cognitive Status: Impaired/Different from baseline ?Area of Impairment: Following commands, Safety/judgement, Awareness, Problem solving, Memory, Attention, Orientation ?  ?  ?  ?  ?  ?  ?  ?  ?Orientation Level: Disoriented to, Situation, Time ?Current Attention Level: Focused ?Memory: Decreased recall of precautions, Decreased short-term memory ?Following Commands: Follows one step commands consistently, Follows multi-step commands inconsistently, Follows one step commands with increased time ?Safety/Judgement: Decreased awareness of safety, Decreased awareness of deficits ?Awareness: Intellectual ?Problem Solving: Slow processing, Difficulty sequencing, Requires verbal cues, Requires tactile cues ?General Comments: pt did not have any hullucinations this date but was much less verbal than previous date. she has been sleep alot today evrsus previous date and auithor feels the change in personality is due to medicines issued previous night. ?  ?  ? ?  ?Exercises   ? ?  ?General Comments   ?  ?  ? ?Pertinent Vitals/Pain Pain Assessment ?Pain Assessment: 0-10 ?Pain Score: 2  ?Pain Location: R knee during gait ?Pain Descriptors / Indicators: Aching ?Pain Intervention(s): Monitored during session, Repositioned  ? ? ?Home Living   ?  ?  ?  ?  ?  ?  ?  ?  ?  ?   ?  ?Prior Function    ?  ?  ?   ? ?PT Goals (current goals can now be found in the care plan section) Acute Rehab PT Goals ?Patient Stated Goal: none stated ? ?  ?Frequency ? ? ? 7X/week ? ? ? ?  ?PT Plan Current plan remains appropriate  ? ? ?Co-evaluation   ?  ?  ?  ?  ? ?  ?AM-PAC PT "6 Clicks" Mobility   ?Outcome Measure ? Help needed turning from your back to your side while in a flat bed without using bedrails?: A Little ?Help needed moving from lying on your  back to sitting on the side of a flat bed without using bedrails?: A Little ?Help needed moving to and from a bed to a chair (including a wheelchair)?: A Little ?Help needed standing up from a chair using your arms (e.g., wheelchair or bedside chair)?: A Little ?Help needed to walk in hospital room?: A Little ?Help needed climbing 3-5 steps with a railing? : A Little ?6 Click Score: 18 ? ?  ?End of Session Equipment Utilized During Treatment: Gait belt ?Activity Tolerance: Patient tolerated treatment well ?Patient left: in chair;with call bell/phone within reach;with chair alarm set;with family/visitor present;with nursing/sitter in room ?Nurse Communication: Mobility status ?PT Visit Diagnosis: Unsteadiness on feet (R26.81);Other abnormalities of gait and mobility (R26.89);Muscle weakness (generalized) (M62.81);Difficulty in walking, not elsewhere classified (R26.2);Hemiplegia and hemiparesis ?Hemiplegia - Right/Left: Right ?Hemiplegia - dominant/non-dominant: Dominant ?Hemiplegia - caused by: Unspecified ?  ? ? ?Time:  -  ?  ? ?Charges:             ?          ? ?Julaine Fusi PTA ?12/05/21, 6:40  PM  ? ?

## 2021-12-05 NOTE — Progress Notes (Signed)
Neurology progress note ? ?S: HSV resulted negative and acyclovir discontinued. Patient was planned to be discharged yesterday but developed confusion and hallucinations. No e/o UA on labs or other etiology to explain. B1 pending. She does not drink alcohol. ? ?Data: ? ?MRI brain wwo: mild CSVID, otherwise normal for age ? ?MRI c spine wwo: ? ?1. Normal MRI appearance of the cervical spinal cord. No abnormal ?enhancement. ?2. Degenerative spondylosis at C4-5 and C5-6 with resultant mild to ?moderate right C5 and C6 foraminal stenosis. ?3. Left paracentral disc protrusion at C6-7, flattening the left ?hemi cord without cord signal changes. ?4. Moderate left C4 foraminal narrowing related to uncovertebral and ?facet disease. ? ?CNS imaging personally reviewed; I agree with above interpretations ? ?CTA H&N: WNL ? ?rEEG: abnormal due to intermittent left frontotemporal slowing, at times in the delta range, indicating focal dysfunction or potential underlying structural abnormality in this area ? ?O: ? ?Vitals:  ? 12/04/21 2020 12/05/21 0456  ?BP: 105/72 (!) 147/101  ?Pulse: (!) 101 (!) 107  ?Resp: 18 18  ?Temp: 97.9 ?F (36.6 ?C) 98.1 ?F (36.7 ?C)  ?SpO2: 95% 96%  ? ?Gen: awake, alert, NAD ?CV: RRR ?Resp: normal WOB ? ?Neurologic exam ?MS: oriented to self and hospital. Able to follow some simple commands. States she sees bugs on the floor and water running down the wall.  ?Speech: fluent ?*CN: PERRL, blinks to threat bilat, optic discs unable to be visualized 2/2 pupillary constriction, EOMI w/o nystagmus, no ptosis, sensation intact, face symmetric at rest, hearing intact to voice ?*Motor: 5/5 strength diffusely, RUE weakness has resolved.  ?*Sensory: SILT ?*Coordination:  UTA ?*Reflexes:  2+ and symmetric throughout without clonus; toes equiv bilat ?*Gait: deferred ? ?A/P: This is a 76 year old woman with past medical history significant for depression, anxiety, hyperlipidemia, previous tiny acute infarct in the right  PCA distribution with no residual deficits who is admitted for encephalopathy. She has had one similar but much less severe episode in the past that eventually resolved without etiology identified. MRI brain wwo and MRI c spine wwo unrevealing as to etiologies for her encephalopathy and RUE weakness, respectively. It is interesting that the focal slowing on her EEG localizes to the left frontotemporal region which controls both speech and right motor function although there is no apparent structural abnormality in that region on MRI. Given her persistent encephalopathy, leukocytosis, and headache with unknown cause LP was performed. Initial results not c/f bacterial meningitis therefore abx were stopped. HSV negative and acyclovir discontinued. Patient was planned to be discharged yesterday but developed confusion and hallucinations. No e/o UA on labs or other etiology to explain. B1 pending. She does not drink alcohol. Suspect hospital delirium.  ? ?Very interestingly patient had similar presentation to [facility] in Oct that eventually resolved without etiology identified. She had comprehensive CSF evaluation there as well.  ? ?- Psych consult ?- Autoimmune encephalopathy panels on serum (misc send out lab orders drawn 4/11 and 4/13) will take a few weeks to result and should be followed up by PCP  ?- PT/OT/SLP ?- Will continue to follow. ? ?son Peter Garter [phone]  ? ?Su Monks, MD ?[facility] ?[phone] ? ?If 7pm- 7am, please page neurology on call as listed in [place]. ?

## 2021-12-05 NOTE — Progress Notes (Signed)
?PROGRESS NOTE ? ?Teresa Sherman TDV:[id number] DOB: November 05, 1945 DOA: 11/29/2021 ?PCP: System, Provider Not In ? ? LOS: 5 days  ? ?Brief Narrative / Interim history: ?[name] is a 76 year old female with history of stroke, hyperlipidemia, insomnia, who presents to the emergency department for chief concerns of altered mental status and aphasia.  She was initially weak on the right side.  COVID/influenza A/influenza B PCR were negative.  CT scan of the brain unremarkable.  Neurology consulted and she was admitted to the hospital.  She has a history of a stroke in October 2022 but after that she returned to baseline. ? ?Subjective / 24h Interval events: ?Slept all night throughout the night.  Sleepy this morning but wakes up easily.  No further hallucinations ? ?Assesement and Plan: ?Principal Problem: ?  Altered mental status ?Active Problems: ?  Depression with anxiety ?  Disordered sleep ?  Hyperlipidemia ?  History of stroke ?  Insomnia ? ?Assessment and Plan: ?Principal problem ?Altered mental status, aphasia, right upper extremity weakness -neurology consulted and following.  Discussed with Dr. Quinn Axe.  She underwent imaging of the brain which was fairly unremarkable.  She was placed on broad-spectrum antibiotics and underwent an LP on 4/11, findings not consistent with bacterial meningitis and antibacterials were discontinued 4/12.  HSV PCR was negative and antivirals were discontinued as well.  TSH was unremarkable.  VDRL negative.  Currently oligoclonal bands are pending, meningitis/encephalitis panel pending.  On 4/14 she was getting worse in terms of her confusion and hallucinations and psychiatry was consulted.  It was felt like she has not slept in several days which can contribute, and was placed on asenapine x1.  Slept throughout the night ?-B1 pending ?-patient has a phobia of bugs and apparently she is using excessive amounts of bug sprays in the home raising concern about a toxidrome,  but I would have expected her to improve while here not get worse in the last couple of days ?-Mental status overall improving now, she is now longer having hallucinations.  Anticipate home within couple of days if improvement continues ? ?Active problems ?History of stroke -continue aspirin, atorvastatin ? ?Leukocytosis possibly due to aspiration pneumonia-new this morning, her chest x-ray shows potential infiltrate, she is coughing, I wonder whether she had a mild aspiration event while being confused.  Start Augmentin for a few days ? ?Hyperlipidemia -continue statin ? ?Disordered sleep -on trazodone, received asenapine x1 ? ? ? ?Scheduled Meds: ?  stroke: early stages of recovery book   Does not apply Once  ? amoxicillin-clavulanate  1 tablet Oral Q12H  ? aspirin  81 mg Oral Daily  ? atorvastatin  40 mg Oral Daily  ? cyanocobalamin  1,000 mcg Intramuscular Once per day on Mon Wed Fri  ? enoxaparin (LOVENOX) injection  40 mg Subcutaneous QHS  ? mouth rinse  15 mL Mouth Rinse BID  ? melatonin  5 mg Oral QHS  ? thiamine  100 mg Oral Daily  ? ?Continuous Infusions: ? ? ?PRN Meds:.acetaminophen, aspirin-acetaminophen-caffeine, senna-docusate ? ?Diet Orders (From admission, onward)  ? ?  Start     Ordered  ? 12/01/21 1527  Diet regular Room service appropriate? Yes; Fluid consistency: Thin  Diet effective now       ?Question Answer Comment  ?Room service appropriate? Yes   ?Fluid consistency: Thin   ?  ? 12/01/21 1527  ? ?  ?  ? ?  ? ? ?DVT prophylaxis: enoxaparin (LOVENOX) injection 40 mg Start:  11/30/21 2200 ?SCD's Start: 11/29/21 1834 ? ? ?Lab Results  ?Component Value Date  ? PLT 260 12/05/2021  ? ? ?  Code Status: DNR ? ?Family Communication: Discussed with daughter over the phone ? ?Status is: Inpatient ? ?Remains inpatient appropriate because: New WBC ? ?Level of care: Med-Surg ? ?Consultants:  ?Neurology ? ?Procedures:  ?LP ? ?Objective: ?Vitals:  ? 12/04/21 2020 12/05/21 0456 12/05/21 0853 12/05/21 1100  ?BP:  105/72 (!) 147/101 122/82 126/79  ?Pulse: (!) 101 (!) 107 (!) 103 (!) 106  ?Resp: '18 18 18 17  '$ ?Temp: 97.9 ?F (36.6 ?C) 98.1 ?F (36.7 ?C) 97.9 ?F (36.6 ?C) 97.9 ?F (36.6 ?C)  ?TempSrc: Axillary Oral Axillary   ?SpO2: 95% 96% 93% 95%  ?Weight:      ?Height:      ? ? ?Intake/Output Summary (Last 24 hours) at 12/05/2021 1148 ?Last data filed at 12/05/2021 1010 ?Gross per 24 hour  ?Intake 245.04 ml  ?Output --  ?Net 245.04 ml  ? ? ?Wt Readings from Last 3 Encounters:  ?11/29/21 61.2 kg  ?02/15/19 56.7 kg  ?10/22/18 56.7 kg  ? ? ?Examination: ? ?Constitutional: NAD ?Eyes: lids and conjunctivae normal, no scleral icterus ?ENMT: mmm ?Neck: normal, supple ?Respiratory: Mild bibasilar rhonchi, no wheezing ?Cardiovascular: Regular rate and rhythm, no murmurs / rubs / gallops.  ?Abdomen: soft, no distention, no tenderness. Bowel sounds positive.  ?Skin: no rashes ?Neurologic: no focal deficits, equal strength ? ?Data Reviewed: I have independently reviewed following labs and imaging studies  ? ?CBC ?Recent Labs  ?Lab 11/29/21 ?1615 11/30/21 ?0906 12/01/21 ?1517 12/02/21 ?0403 12/03/21 ?6160 12/05/21 ?7371  ?WBC 9.0 16.0* 7.7 6.8 7.6 17.2*  ?HGB 14.3 13.8 12.8 11.4* 11.9* 13.7  ?HCT 42.4 40.4 37.4 33.8* 35.5* 40.6  ?PLT 215 201 175 187 193 260  ?MCV 87.8 87.6 88.2 88.3 89.6 89.2  ?MCH 29.6 29.9 30.2 29.8 30.1 30.1  ?MCHC 33.7 34.2 34.2 33.7 33.5 33.7  ?RDW 14.6 14.6 14.6 14.9 14.7 15.4  ?LYMPHSABS 2.4  --   --   --   --   --   ?MONOABS 0.9  --   --   --   --   --   ?EOSABS 0.0  --   --   --   --   --   ?BASOSABS 0.0  --   --   --   --   --   ? ? ? ?Recent Labs  ?Lab 11/29/21 ?1444 11/29/21 ?1615 11/29/21 ?1631 11/29/21 ?2039 11/30/21 ?0421 11/30/21 ?0626 12/01/21 ?9485 12/02/21 ?0403 12/03/21 ?4627 12/04/21 ?0350 12/05/21 ?0938  ?NA  --  132*  --   --   --    < > 136 139 139 139 137  ?K  --  4.1  --   --   --    < > 3.3* 3.1* 3.3* 3.7 3.8  ?CL  --  98  --   --   --    < > 105 108 109 106 102  ?CO2  --  24  --   --   --    < > '23  25 24 25 25  '$ ?GLUCOSE  --  105*  --   --   --    < > 95 83 95 101* 116*  ?BUN  --  20  --   --   --    < > '13 9 13 18 '$ 25*  ?CREATININE  --  0.70  --   --   --    < >  0.49 0.46 0.61 0.56 0.68  ?CALCIUM  --  9.0  --   --   --    < > 7.9* 8.1* 8.8* 9.3 9.0  ?AST  --  27  --   --   --   --   --   --   --   --  27  ?ALT  --  28  --   --   --   --   --   --   --   --  31  ?ALKPHOS  --  121  --   --   --   --   --   --   --   --  89  ?BILITOT  --  1.6*  --   --   --   --   --   --   --   --  0.8  ?ALBUMIN  --  3.8  --   --   --   --   --   --   --   --  3.6  ?MG  --   --   --   --   --   --   --  1.9  --   --   --   ?PROCALCITON  --   --  <0.10  --   --   --   --   --   --   --   --   ?INR 1.1  --   --   --   --   --   --   --   --   --   --   ?TSH  --  1.475  --   --   --   --   --   --   --   --   --   ?HGBA1C  --   --   --   --  5.4  --   --   --   --   --   --   ?AMMONIA  --   --   --  19  --   --   --   --   --   --   --   ? < > = values in this interval not displayed.  ? ? ? ?------------------------------------------------------------------------------------------------------------------ ?No results for input(s): CHOL, HDL, LDLCALC, TRIG, CHOLHDL, LDLDIRECT in the last 72 hours. ? ? ?Lab Results  ?Component Value Date  ? HGBA1C 5.4 11/30/2021  ? ?------------------------------------------------------------------------------------------------------------------ ?No results for input(s): TSH, T4TOTAL, T3FREE, THYROIDAB in the last 72 hours. ? ?Invalid input(s): FREET3 ? ? ?Cardiac Enzymes ?No results for input(s): CKMB, TROPONINI, MYOGLOBIN in the last 168 hours. ? ?Invalid input(s): CK ?------------------------------------------------------------------------------------------------------------------ ?No results found for: BNP ? ?CBG: ?No results for input(s): GLUCAP in the last 168 hours. ? ?Recent Results (from the past 240 hour(s))  ?Resp Panel by RT-PCR (Flu A&B, Covid) Nasopharyngeal Swab     Status: None  ?  Collection Time: 11/29/21  2:44 PM  ? Specimen: Nasopharyngeal Swab; Nasopharyngeal(NP) swabs in vial transport medium  ?Result Value Ref Range Status  ? SARS Coronavirus 2 by RT PCR NEGATIVE NEGATIVE [place]
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
## 2021-12-06 DIAGNOSIS — R4182 Altered mental status, unspecified: Secondary | ICD-10-CM | POA: Diagnosis not present

## 2021-12-06 LAB — CBC
HCT: 40.3 % (ref 36.0–46.0)
Hemoglobin: 13.6 g/dL (ref 12.0–15.0)
MCH: 30.3 pg (ref 26.0–34.0)
MCHC: 33.7 g/dL (ref 30.0–36.0)
MCV: 89.8 fL (ref 80.0–100.0)
Platelets: 265 10*3/uL (ref 150–400)
RBC: 4.49 MIL/uL (ref 3.87–5.11)
RDW: 15.6 % — ABNORMAL HIGH (ref 11.5–15.5)
WBC: 13.2 10*3/uL — ABNORMAL HIGH (ref 4.0–10.5)
nRBC: 0 % (ref 0.0–0.2)

## 2021-12-06 LAB — BASIC METABOLIC PANEL
Anion gap: 7 (ref 5–15)
BUN: 28 mg/dL — ABNORMAL HIGH (ref 8–23)
CO2: 25 mmol/L (ref 22–32)
Calcium: 8.6 mg/dL — ABNORMAL LOW (ref 8.9–10.3)
Chloride: 102 mmol/L (ref 98–111)
Creatinine, Ser: 0.51 mg/dL (ref 0.44–1.00)
GFR, Estimated: >60 mL/min (ref >60)
Glucose, Bld: 117 mg/dL — ABNORMAL HIGH (ref 70–99)
Potassium: 3.8 mmol/L (ref 3.5–5.1)
Sodium: 134 mmol/L — ABNORMAL LOW (ref 135–145)

## 2021-12-06 MED ORDER — ALUM & MAG HYDROXIDE-SIMETH 200-200-20 MG/5ML PO SUSP
30.0000 mL | ORAL | Status: DC | PRN
  Administered 2021-12-06 – 2021-12-08 (×3): 30 mL via ORAL
  Filled 2021-12-06 (×3): qty 30

## 2021-12-06 NOTE — Progress Notes (Signed)
Speech Language Pathology Treatment:    ?Patient Details ?Name: Teresa Sherman ?MRN: [id number] ?DOB: 1946-01-01 ?Today's Date: 12/06/2021 ?Time: 0920-0950 ?SLP Time Calculation (min) (ACUTE ONLY): 30 min ? ?Assessment / Plan / Recommendation ?Clinical Impression ? Pt seen for speech/language/cognitive re-assessment. Pt alert and cooperative. Cleared with student nurses.  ?  ?Re-assessment completed via informal means. Pt to continues to make improvements in cognitive-linguistic functioning. Pt demonstrated intact auditory comprehension for complex yes/no questions and 2-step commands; completing both with 100% accuracy with extra time. Pt with reduced executive functioning and verbal fluency during confrontation naming task (pt named 7 animals in 60s). Convergent naming was [facility]. Pt continues with reduced verbal problem solving including difficulty recall 911 and use of call bell. Mild confusion persists as pt asking "are we at [facility]?" Pt tearful and difficult to redirect at times this session which affected participation in skilled SLP session..  ?  ?Based on today's tx, anticipate need for 24 hour supervision/assistance and post-acute SLP services for higher level cognitive-linguistic assessment/tx. SLP to continue for f/u per POC for cognitive-linguistic tx. ?  ?Pt, RN made aware of results, recommendations, and SLP POC. Pt verbailzed understanding/agreement. ?  ?HPI HPI: This is a 76 year old woman with past medical history significant for depression, anxiety, hyperlipidemia, previous tiny acute infarct in the right PCA distribution with no residual deficits who is admitted for encephalopathy.  At baseline she is independent and lives alone.  She was previously diagnosed with mild cognitive impairment but is able to speak in function with minimal issues.  She does occasionally display emotional lability and say inappropriate things at times however these have been ongoing issues.  Last known well was 2  to 3 days prior to presentation when family saw her last.  She was brought in by EMS after she was discovered by a [employer] cable guy he saw her through a window standing naked in the living room and acting abnormally.  In the ED she was essentially globally aphasic.  She also had notable weakness in the right upper and lower extremity. CT head and MRI brain wo contrast showed NAICP.  CNS imaging was personally reviewed.  UA not c/f infection. Blood cultures x2 pending. No lab disturbances to explain presentation UDS pan-negative. Ammonia, TSH WNL.  White blood cell count was 9 on admission and increased to 16 today without clear reason.  No source for infection has been identified. MRI 11/30/21 revealing "Normal MRI appearance of the cervical spinal cord. No abnormal  enhancement." EEG 12/01/21: "This EEG was obtained while awake and asleep and is abnormal due to intermittent left frontotemporal slowing, at times in the delta range, indicating focal dysfunction or potential underlying structural abnormality in this area." CSF w/ normal cell count, not c/w bacterial meningitis. ?  ?   ?SLP Plan ? Continue with current plan of care ? ?  ?  ?Recommendations for follow up therapy are one component of a multi-disciplinary discharge planning process, led by the attending physician.  Recommendations may be updated based on patient status, additional functional criteria and insurance authorization. ?  ? ?Recommendations   ? ? ? ? Oral Care Recommendations: Oral care BID ?Follow Up Recommendations: Home health SLP ?Assistance recommended at discharge: Frequent or constant Supervision/Assistance ?SLP Visit Diagnosis: Cognitive communication deficit (R41.841) ?Plan: Continue with current plan of care ? ? ? ? ?  ?  ? ?Cherrie Gauze, M.S., CCC-SLP ?Speech-Language Pathologist ?[facility] ?[phone] ([place]) ? ?[name]  [place] ? ?12/06/2021, 12:53 PM ?
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
## 2021-12-06 NOTE — Progress Notes (Signed)
?PROGRESS NOTE ? ?Dodi Leu JIR:[id number] DOB: 08-19-1946 DOA: 11/29/2021 ?PCP: System, Provider Not In ? ? LOS: 6 days  ? ?Brief Narrative / Interim history: ?Ms. Guila Heringer is a 76 year old female with history of stroke, hyperlipidemia, insomnia, who presents to the emergency department for chief concerns of altered mental status and aphasia.  She was initially weak on the right side.  COVID/influenza A/influenza B PCR were negative.  CT scan of the brain unremarkable.  Neurology consulted and she was admitted to the hospital.  She has a history of a stroke in October 2022 but after that she returned to baseline. ? ?Subjective / 24h Interval events: ?Slept well overnight.  A little bit drowsy this morning but wakes up easily and can carry a conversation.  Alert and oriented x4.  No additional hallucinations ? ?Assesement and Plan: ?Principal Problem: ?  Altered mental status ?Active Problems: ?  Depression with anxiety ?  Disordered sleep ?  Hyperlipidemia ?  History of stroke ?  Insomnia ? ?Assessment and Plan: ?Principal problem ?Altered mental status, aphasia, right upper extremity weakness -neurology consulted and following.  Discussed with Dr. Quinn Axe.  She underwent imaging of the brain which was fairly unremarkable.  She was placed on broad-spectrum antibiotics and underwent an LP on 4/11, findings not consistent with bacterial meningitis and antibacterials were discontinued 4/12.  HSV PCR was negative and antivirals were discontinued as well.  TSH was unremarkable.  VDRL negative.  Currently oligoclonal bands are pending, meningitis/encephalitis panel pending.  On 4/14 she was getting worse in terms of her confusion and hallucinations and psychiatry was consulted.  It was felt like she has not slept in several days which can contribute, and was placed on asenapine x1.  Her altered mental status seems to be improving, hallucinations improving, appreciate psych consult ?-B1 pending ?-patient has  a phobia of bugs and apparently she is using excessive amounts of bug sprays in the home raising concern about a toxidrome, but I would have expected her to improve while here not get worse in the last couple of days ?-If continues to improve perhaps home in the next day or 2 ? ?Active problems ?History of stroke -continue aspirin, atorvastatin ? ?Leukocytosis possibly due to aspiration pneumonia-she had a new elevation in her white count 4/15, increased cough, her chest x-ray shows potential infiltrate I wonder whether she had a mild aspiration event while being confused.  Started on Augmentin/15, plan for 5-day course ? ?Hyperlipidemia -continue statin ? ?Disordered sleep -on trazodone, received asenapine x1 per psychiatry ? ? ? ?Scheduled Meds: ?  stroke: early stages of recovery book   Does not apply Once  ? amoxicillin-clavulanate  1 tablet Oral Q12H  ? aspirin  81 mg Oral Daily  ? atorvastatin  40 mg Oral Daily  ? cyanocobalamin  1,000 mcg Intramuscular Once per day on Mon Wed Fri  ? enoxaparin (LOVENOX) injection  40 mg Subcutaneous QHS  ? mouth rinse  15 mL Mouth Rinse BID  ? melatonin  5 mg Oral QHS  ? thiamine  100 mg Oral Daily  ? traZODone  25 mg Oral QHS  ? ?Continuous Infusions: ? ? ?PRN Meds:.acetaminophen, aspirin-acetaminophen-caffeine, guaiFENesin, senna-docusate ? ?Diet Orders (From admission, onward)  ? ?  Start     Ordered  ? 12/01/21 1527  Diet regular Room service appropriate? Yes; Fluid consistency: Thin  Diet effective now       ?Question Answer Comment  ?Room service appropriate? Yes   ?Fluid  consistency: Thin   ?  ? 12/01/21 1527  ? ?  ?  ? ?  ? ? ?DVT prophylaxis: enoxaparin (LOVENOX) injection 40 mg Start: 11/30/21 2200 ?SCD's Start: 11/29/21 1834 ? ? ?Lab Results  ?Component Value Date  ? PLT 265 12/06/2021  ? ? ?  Code Status: DNR ? ?Family Communication: Discussed with daughter over the phone ? ?Status is: Inpatient ? ?Remains inpatient appropriate because: New WBC ? ?Level of care:  Med-Surg ? ?Consultants:  ?Neurology ? ?Procedures:  ?LP ? ?Objective: ?Vitals:  ? 12/05/21 1100 12/05/21 1630 12/05/21 1956 12/06/21 0315  ?BP: 126/79 (!) 126/93 128/88 94/66  ?Pulse: (!) 106 (!) 109 (!) 110 99  ?Resp: '17 15 15 18  '$ ?Temp: 97.9 ?F (36.6 ?C) 98.6 ?F (37 ?C) 99 ?F (37.2 ?C) 97.8 ?F (36.6 ?C)  ?TempSrc:   Oral Axillary  ?SpO2: 95% 94% 91% (!) 89%  ?Weight:      ?Height:      ? ? ?Intake/Output Summary (Last 24 hours) at 12/06/2021 1010 ?Last data filed at 12/06/2021 0500 ?Gross per 24 hour  ?Intake 480 ml  ?Output --  ?Net 480 ml  ? ? ?Wt Readings from Last 3 Encounters:  ?11/29/21 61.2 kg  ?02/15/19 56.7 kg  ?10/22/18 56.7 kg  ? ? ?Examination: ? ?Constitutional: NAD ?Eyes: lids and conjunctivae normal, no scleral icterus ?ENMT: mmm ?Neck: normal, supple ?Respiratory: Faint bibasilar rhonchi, no wheezing ?Cardiovascular: Regular rate and rhythm, no murmurs / rubs / gallops.  ?Abdomen: soft, no distention, no tenderness. Bowel sounds positive.  ?Skin: no rashes ?Neurologic: no focal deficits, equal strength ? ?Data Reviewed: I have independently reviewed following labs and imaging studies  ? ?CBC ?Recent Labs  ?Lab 11/29/21 ?1615 11/30/21 ?0906 12/01/21 ?2878 12/02/21 ?0403 12/03/21 ?6767 12/05/21 ?2094 12/06/21 ?7096  ?WBC 9.0   < > 7.7 6.8 7.6 17.2* 13.2*  ?HGB 14.3   < > 12.8 11.4* 11.9* 13.7 13.6  ?HCT 42.4   < > 37.4 33.8* 35.5* 40.6 40.3  ?PLT 215   < > 175 187 193 260 265  ?MCV 87.8   < > 88.2 88.3 89.6 89.2 89.8  ?MCH 29.6   < > 30.2 29.8 30.1 30.1 30.3  ?MCHC 33.7   < > 34.2 33.7 33.5 33.7 33.7  ?RDW 14.6   < > 14.6 14.9 14.7 15.4 15.6*  ?LYMPHSABS 2.4  --   --   --   --   --   --   ?MONOABS 0.9  --   --   --   --   --   --   ?EOSABS 0.0  --   --   --   --   --   --   ?BASOSABS 0.0  --   --   --   --   --   --   ? < > = values in this interval not displayed.  ? ? ? ?Recent Labs  ?Lab 11/29/21 ?1444 11/29/21 ?1615 11/29/21 ?1631 11/29/21 ?2039 11/30/21 ?0421 11/30/21 ?2836 12/02/21 ?0403  12/03/21 ?6294 12/04/21 ?7654 12/05/21 ?6503 12/06/21 ?5465  ?NA  --  132*  --   --   --    < > 139 139 139 137 134*  ?K  --  4.1  --   --   --    < > 3.1* 3.3* 3.7 3.8 3.8  ?CL  --  98  --   --   --    < > 108  109 106 102 102  ?CO2  --  24  --   --   --    < > '25 24 25 25 25  '$ ?GLUCOSE  --  105*  --   --   --    < > 83 95 101* 116* 117*  ?BUN  --  20  --   --   --    < > '9 13 18 '$ 25* 28*  ?CREATININE  --  0.70  --   --   --    < > 0.46 0.61 0.56 0.68 0.51  ?CALCIUM  --  9.0  --   --   --    < > 8.1* 8.8* 9.3 9.0 8.6*  ?AST  --  27  --   --   --   --   --   --   --  27  --   ?ALT  --  28  --   --   --   --   --   --   --  31  --   ?ALKPHOS  --  121  --   --   --   --   --   --   --  89  --   ?BILITOT  --  1.6*  --   --   --   --   --   --   --  0.8  --   ?ALBUMIN  --  3.8  --   --   --   --   --   --   --  3.6  --   ?MG  --   --   --   --   --   --  1.9  --   --   --   --   ?PROCALCITON  --   --  <0.10  --   --   --   --   --   --   --   --   ?INR 1.1  --   --   --   --   --   --   --   --   --   --   ?TSH  --  1.475  --   --   --   --   --   --   --   --   --   ?HGBA1C  --   --   --   --  5.4  --   --   --   --   --   --   ?AMMONIA  --   --   --  19  --   --   --   --   --   --   --   ? < > = values in this interval not displayed.  ? ? ? ?------------------------------------------------------------------------------------------------------------------ ?No results for input(s): CHOL, HDL, LDLCALC, TRIG, CHOLHDL, LDLDIRECT in the last 72 hours. ? ? ?Lab Results  ?Component Value Date  ? HGBA1C 5.4 11/30/2021  ? ?------------------------------------------------------------------------------------------------------------------ ?No results for input(s): TSH, T4TOTAL, T3FREE, THYROIDAB in the last 72 hours. ? ?Invalid input(s): FREET3 ? ? ?Cardiac Enzymes ?No results for input(s): CKMB, TROPONINI, MYOGLOBIN in the last 168 hours. ? ?Invalid input(s):  CK ?------------------------------------------------------------------------------------------------------------------ ?No results found for: BNP ? ?CBG: ?No results for input(s): GLUCAP in the last 168 hours. ? ?Recent Results (from the past 240 hour(s))  ?Resp Panel by RT-PCR (F

## 2021-12-06 NOTE — Progress Notes (Signed)
PT Cancellation Note ? ?Patient Details ?Name: Teresa Sherman ?MRN: [id number] ?DOB: [birth year] ? ? ?Cancelled Treatment:    Reason Eval/Treat Not Completed: Fatigue/lethargy limiting ability to participate ? ?Pt with nursing this am in bathroom for bathing.  Returned x 2 later in AM and pt sound asleep snoring loudly each attempt.  Does not awaken to voice. ? ? ?Chesley Noon ?12/06/2021, 12:34 PM ?

## 2021-12-07 DIAGNOSIS — R4182 Altered mental status, unspecified: Secondary | ICD-10-CM | POA: Diagnosis not present

## 2021-12-07 LAB — CULTURE, RESPIRATORY W GRAM STAIN: Culture: NORMAL

## 2021-12-07 LAB — CBC
HCT: 38.4 % (ref 36.0–46.0)
Hemoglobin: 12.7 g/dL (ref 12.0–15.0)
MCH: 29.9 pg (ref 26.0–34.0)
MCHC: 33.1 g/dL (ref 30.0–36.0)
MCV: 90.4 fL (ref 80.0–100.0)
Platelets: 274 10*3/uL (ref 150–400)
RBC: 4.25 MIL/uL (ref 3.87–5.11)
RDW: 15.4 % (ref 11.5–15.5)
WBC: 9.6 10*3/uL (ref 4.0–10.5)
nRBC: 0 % (ref 0.0–0.2)

## 2021-12-07 LAB — VITAMIN B1: Vitamin B1 (Thiamine): 126.4 nmol/L (ref 66.5–200.0)

## 2021-12-07 NOTE — Progress Notes (Signed)
Occupational Therapy Treatment ?Patient Details ?Name: Teresa Sherman ?MRN: [id number] ?DOB: February 03, 1946 ?Today's Date: 12/07/2021 ? ? ?History of present illness [name] is a 76 year old female with history of stroke, hyperlipidemia, insomnia, who presents to the emergency department for chief concerns of altered mental status. ?  ?OT comments ? Pt received in hallway with daughter's assistance. Pt is pleasant and agreeable to OT intervention. Pt participating in visual scanning task in hallways. Pt ambulates with close supervision and min cuing for visual scanning to locate items on L <> R side of wall. Pt able to reach up with R UE and pull from wall. Several falling to floor and pt bending over to pick up with min guard for safety. Pt participated in navigation task to locate room with mod multimodal cuing to locate correct room number. She was able to correctly verbalized room number but unable to locate without assistance. She was also able to look at clock and tell correct time this session. Pt seated in recliner chair with chair alarm and daughter remaining within the room. All needs within reach.   ? ?Recommendations for follow up therapy are one component of a multi-disciplinary discharge planning process, led by the attending physician.  Recommendations may be updated based on patient status, additional functional criteria and insurance authorization. ?   ?Follow Up Recommendations ? Home health OT  ?  ?Assistance Recommended at Discharge Frequent or constant Supervision/Assistance  ?Patient can return home with the following ? A little help with walking and/or transfers;A little help with bathing/dressing/bathroom;Help with stairs or ramp for entrance;Assist for transportation;Assistance with cooking/housework;Direct supervision/assist for financial management;Direct supervision/assist for medications management ?  ?Equipment Recommendations ? None recommended by OT  ?  ?   ?Precautions /  Restrictions Precautions ?Precautions: Fall  ? ? ?  ? ?Mobility Bed Mobility ?  ?  ?  ?  ?  ?  ?  ?  ?  ? ?Transfers ?Overall transfer level: Needs assistance ?Equipment used: None ?Transfers: Sit to/from Stand ?Sit to Stand: Supervision ?  ?  ?  ?  ?  ?  ?  ?  ?Balance Overall balance assessment: Needs assistance ?Sitting-balance support: Feet supported ?Sitting balance-Leahy Scale: Good ?  ?  ?Standing balance support: During functional activity ?Standing balance-Leahy Scale: Fair ?  ?  ?  ?  ?  ?  ?  ?  ?  ?  ?  ?  ?   ? ?ADL either performed or assessed with clinical judgement  ? ?ADL   ?  ?  ?  ?  ?  ?  ?  ?  ?  ?  ?  ?  ?  ?  ?  ?  ?  ?  ?  ?  ?  ? ?Extremity/Trunk Assessment Upper Extremity Assessment ?Upper Extremity Assessment: Overall WFL for tasks assessed ?  ?  ?  ?  ?  ? ?Vision Patient Visual Report: No change from baseline ?  ?  ?   ?   ? ?Cognition Arousal/Alertness: Awake/alert ?Behavior During Therapy: [facility] for tasks assessed/performed ?Overall Cognitive Status: Within Functional Limits for tasks assessed ?Area of Impairment: Safety/judgement, Awareness, Attention, Orientation ?  ?  ?  ?  ?  ?  ?  ?  ?Orientation Level: Place, Person, Time ?Current Attention Level: Focused ?  ?Following Commands: Follows one step commands consistently, Follows one step commands with increased time ?Safety/Judgement: Decreased awareness of safety, Decreased awareness of deficits ?Awareness: Intellectual ?  Problem Solving: Slow processing, Difficulty sequencing, Requires verbal cues, Requires tactile cues ?  ?  ?  ?   ?   ?   ?   ? ? ?Pertinent Vitals/ Pain       Pain Assessment ?Pain Assessment: No/denies pain ? ?   ?   ? ?Frequency ? Min 3X/week  ? ? ? ? ?  ?Progress Toward Goals ? ?OT Goals(current goals can now be found in the care plan section) ? Progress towards OT goals: Progressing toward goals ? ?Acute Rehab OT Goals ?Patient Stated Goal: to get stronger and go home ?OT Goal Formulation: With  patient/family ?Time For Goal Achievement: 12/15/21 ?Potential to Achieve Goals: Good  ?Plan Frequency remains appropriate;Discharge plan remains appropriate   ? ?   ?AM-PAC OT "6 Clicks" Daily Activity     ?Outcome Measure ? ? Help from another person eating meals?: None ?Help from another person taking care of personal grooming?: None ?Help from another person toileting, which includes using toliet, bedpan, or urinal?: A Little ?Help from another person bathing (including washing, rinsing, drying)?: A Little ?Help from another person to put on and taking off regular upper body clothing?: None ?Help from another person to put on and taking off regular lower body clothing?: A Little ?6 Click Score: 21 ? ?  ?End of Session   ? ?OT Visit Diagnosis: Unsteadiness on feet (R26.81);Repeated falls (R29.6);Muscle weakness (generalized) (M62.81) ?  ?Activity Tolerance Patient tolerated treatment well ?  ?Patient Left in chair;with call bell/phone within reach;with chair alarm set;with family/visitor present ?  ?Nurse Communication Mobility status ?  ? ?   ? ?Time: 1517-6160 ?OT Time Calculation (min): 23 min ? ?Charges: OT General Charges ?$OT Visit: 1 Visit ?OT Treatments ?$Therapeutic Activity: 23-37 mins ? ?Darleen Crocker, MS, OTR/L , CBIS ?ascom [phone]  ?12/07/21, 3:11 PM  ?

## 2021-12-07 NOTE — Care Management Important Message (Signed)
Important Message ? ?Patient Details  ?Name: Teresa Sherman ?MRN: [id number] ?Date of Birth: 1946-04-24 ? ? ?Medicare Important Message Given:  Yes ? ? ? ? ?[name] ?12/07/2021, 1:13 PM ?

## 2021-12-07 NOTE — TOC Progression Note (Signed)
Transition of Care (TOC) - Progression Note  ? ? ?Patient Details  ?Name: Teresa Sherman ?MRN: [id number] ?Date of Birth: 06-22-1946 ? ?Transition of Care (TOC) CM/SW Contact  ?Pete Pelt, RN ?Phone Number: ?12/07/2021, 11:51 AM ? ?Clinical Narrative:   [facility] will accept patient as per [place]. ? ? ? ?Expected Discharge Plan: [place] ?Barriers to Discharge: Continued Medical Work up ? ?Expected Discharge Plan and Services ?Expected Discharge Plan: [place] ?  ?  ?  ?Living arrangements for the past 2 months: [place] ?                ?  ?  ?  ?  ?  ?  ?[place] Agency: [facility] ?Date HH Agency Contacted: 12/04/21 ?Time HH Agency Contacted: 0900 ?Representative spoke with at [place]: Judson Roch ? ? ?Social Determinants of Health (SDOH) Interventions ?  ? ?Readmission Risk Interventions ?   ? View : No data to display.  ?  ?  ?  ? ? ?

## 2021-12-07 NOTE — Progress Notes (Signed)
?PROGRESS NOTE ? ?Jamielynn Wigley UVO:[id number] DOB: [birth year] DOA: 11/29/2021 ?PCP: System, Provider Not In ? ? LOS: 7 days  ? ?Brief Narrative / Interim history: ?Ms. Kharis Dozier is a 76 year old female with history of stroke, hyperlipidemia, insomnia, who presents to the emergency department for chief concerns of altered mental status and aphasia.  She was initially weak on the right side.  COVID/influenza A/influenza B PCR were negative.  CT scan of the brain unremarkable.  Neurology consulted and she was admitted to the hospital.  She has a history of a stroke in October 2022 but after that she returned to baseline. ? ?Subjective / 24h Interval events: ?Slept well overnight. Less drowsy this morning. No chest pain, no nausea / vomiting.  ? ?Assesement and Plan: ?Principal Problem: ?  Altered mental status ?Active Problems: ?  Depression with anxiety ?  Disordered sleep ?  Hyperlipidemia ?  History of stroke ?  Insomnia ? ?Assessment and Plan: ?Principal problem ?Altered mental status, aphasia, right upper extremity weakness -neurology consulted and following.  She underwent extensive imaging of the brain which was fairly unremarkable.  Initially she was placed on broad-spectrum antibiotics, underwent an LP on 4/11.  LP did not show evidence of bacterial meningitis, HSV PCR was negative and all antibiotics and antivirals were discontinued.  TSH, VDRL, unremarkable.  Currently oligoclonal bands are pending, meningitis / encephalitis panel pending, and these need to be followed as an outpatient.  Hospital course complicated by worsening encephalopathy and hallucinations, for which psychiatry was consulted.  It was thought to be due to profound insomnia/lack of sleep, she was given asenapine x1, has had few good nights of sleep and currently improving.  Of note, patient has a phobia of bugs and apparently she is using excessive amounts of bug sprays in the home raising concern about a toxidrome.  Today she  looks a whole lot better than the past weekend, if this continues and she has no further hallucinations anticipate home discharge for tomorrow.  Home health ordered, she will need a home walker ? ?Active problems ?History of stroke -continue aspirin, atorvastatin ?Leukocytosis possibly due to aspiration pneumonia-she had a new elevation in her white count 4/15, increased cough, her chest x-ray shows potential infiltrate I wonder whether she had a mild aspiration event while being confused.  Continue Augmentin ?Hyperlipidemia -continue statin ?Disordered sleep -on trazodone, received asenapine x1 per psychiatry ? ? ? ?Scheduled Meds: ?  stroke: early stages of recovery book   Does not apply Once  ? amoxicillin-clavulanate  1 tablet Oral Q12H  ? aspirin  81 mg Oral Daily  ? atorvastatin  40 mg Oral Daily  ? cyanocobalamin  1,000 mcg Intramuscular Once per day on Mon Wed Fri  ? enoxaparin (LOVENOX) injection  40 mg Subcutaneous QHS  ? mouth rinse  15 mL Mouth Rinse BID  ? melatonin  5 mg Oral QHS  ? thiamine  100 mg Oral Daily  ? traZODone  25 mg Oral QHS  ? ?Continuous Infusions: ? ? ?PRN Meds:.acetaminophen, alum & mag hydroxide-simeth, aspirin-acetaminophen-caffeine, guaiFENesin, senna-docusate ? ?Diet Orders (From admission, onward)  ? ?  Start     Ordered  ? 12/01/21 1527  Diet regular Room service appropriate? Yes; Fluid consistency: Thin  Diet effective now       ?Question Answer Comment  ?Room service appropriate? Yes   ?Fluid consistency: Thin   ?  ? 12/01/21 1527  ? ?  ?  ? ?  ? ? ?DVT  prophylaxis: enoxaparin (LOVENOX) injection 40 mg Start: 11/30/21 2200 ?SCD's Start: 11/29/21 1834 ? ? ?Lab Results  ?Component Value Date  ? PLT 274 12/07/2021  ? ? ?  Code Status: DNR ? ?Family Communication: Discussed with daughter over the phone ? ?Status is: Inpatient ? ?Remains inpatient appropriate because: New WBC ? ?Level of care: Med-Surg ? ?Consultants:  ?Neurology ? ?Procedures:  ?LP ? ?Objective: ?Vitals:  ?  12/06/21 1638 12/06/21 2100 12/07/21 0514 12/07/21 1018  ?BP: 122/81 113/82 125/86 91/75  ?Pulse: (!) 110 (!) 110 97 99  ?Resp: '20 16 16   '$ ?Temp: 98.1 ?F (36.7 ?C) 98.3 ?F (36.8 ?C) 98.5 ?F (36.9 ?C)   ?TempSrc:  Oral Oral   ?SpO2: 93% 93% 97% 98%  ?Weight:      ?Height:      ? ? ?Intake/Output Summary (Last 24 hours) at 12/07/2021 1210 ?Last data filed at 12/07/2021 1025 ?Gross per 24 hour  ?Intake 360 ml  ?Output --  ?Net 360 ml  ? ? ?Wt Readings from Last 3 Encounters:  ?11/29/21 61.2 kg  ?02/15/19 56.7 kg  ?10/22/18 56.7 kg  ? ? ?Examination: ? ?Constitutional: NAD ?Eyes: lids and conjunctivae normal, no scleral icterus ?ENMT: mmm ?Neck: normal, supple ?Respiratory: clear to auscultation bilaterally, no wheezing, no crackles.  ?Cardiovascular: Regular rate and rhythm, no murmurs / rubs / gallops. No LE edema. ?Abdomen: soft, no distention, no tenderness. Bowel sounds positive.  ?Skin: no rashes ?Neurologic: no focal deficits, equal strength ? ?Data Reviewed: I have independently reviewed following labs and imaging studies  ? ?CBC ?Recent Labs  ?Lab 12/02/21 ?0403 12/03/21 ?0356 12/05/21 ?5277 12/06/21 ?8242 12/07/21 ?0512  ?WBC 6.8 7.6 17.2* 13.2* 9.6  ?HGB 11.4* 11.9* 13.7 13.6 12.7  ?HCT 33.8* 35.5* 40.6 40.3 38.4  ?PLT 187 193 260 265 274  ?MCV 88.3 89.6 89.2 89.8 90.4  ?MCH 29.8 30.1 30.1 30.3 29.9  ?MCHC 33.7 33.5 33.7 33.7 33.1  ?RDW 14.9 14.7 15.4 15.6* 15.4  ? ? ? ?Recent Labs  ?Lab 12/02/21 ?0403 12/03/21 ?0356 12/04/21 ?0402 12/05/21 ?3536 12/06/21 ?1443  ?NA 139 139 139 137 134*  ?K 3.1* 3.3* 3.7 3.8 3.8  ?CL 108 109 106 102 102  ?CO2 '25 24 25 25 25  '$ ?GLUCOSE 83 95 101* 116* 117*  ?BUN '9 13 18 '$ 25* 28*  ?CREATININE 0.46 0.61 0.56 0.68 0.51  ?CALCIUM 8.1* 8.8* 9.3 9.0 8.6*  ?AST  --   --   --  27  --   ?ALT  --   --   --  31  --   ?ALKPHOS  --   --   --  47  --   ?BILITOT  --   --   --  0.8  --   ?ALBUMIN  --   --   --  3.6  --   ?MG 1.9  --   --   --   --    ? ? ? ?------------------------------------------------------------------------------------------------------------------ ?No results for input(s): CHOL, HDL, LDLCALC, TRIG, CHOLHDL, LDLDIRECT in the last 72 hours. ? ? ?Lab Results  ?Component Value Date  ? HGBA1C 5.4 11/30/2021  ? ?------------------------------------------------------------------------------------------------------------------ ?No results for input(s): TSH, T4TOTAL, T3FREE, THYROIDAB in the last 72 hours. ? ?Invalid input(s): FREET3 ? ? ?Cardiac Enzymes ?No results for input(s): CKMB, TROPONINI, MYOGLOBIN in the last 168 hours. ? ?Invalid input(s): CK ?------------------------------------------------------------------------------------------------------------------ ?No results found for: BNP ? ?CBG: ?No results for input(s): GLUCAP in the last 168 hours. ? ?Recent Results (  from the past 240 hour(s))  ?Resp Panel by RT-PCR (Flu A&B, Covid) Nasopharyngeal Swab     Status: None  ? Collection Time: 11/29/21  2:44 PM  ? Specimen: Nasopharyngeal Swab; Nasopharyngeal(NP) swabs in vial transport medium  ?Result Value Ref Range Status  ? SARS Coronavirus 2 by RT PCR NEGATIVE NEGATIVE Final  ?  Comment: (NOTE) ?SARS-CoV-2 target nucleic acids are NOT DETECTED. ? ?The SARS-CoV-2 RNA is generally detectable in upper respiratory ?specimens during the acute phase of infection. The lowest ?concentration of SARS-CoV-2 viral copies this assay can detect is ?138 copies/mL. A negative result does not preclude SARS-Cov-2 ?infection and should not be used as the sole basis for treatment or ?other patient management decisions. A negative result may occur with  ?improper specimen collection/handling, submission of specimen other ?than nasopharyngeal swab, presence of viral mutation(s) within the ?areas targeted by this assay, and inadequate number of viral ?copies(<138 copies/mL). A negative result must be combined with ?clinical observations, patient history, and  epidemiological ?information. The expected result is Negative. ? ?Fact Sheet for Patients:  ?EntrepreneurPulse.com.au ? ?Fact Sheet for Healthcare Providers:  ?IncredibleEmployment.be ? ?This test is no t yet approved or c

## 2021-12-07 NOTE — Progress Notes (Signed)
Speech Language Pathology Treatment:    ?Patient Details ?Name: Teresa Sherman ?MRN: [id number] ?DOB: September 26, 1945 ?Today's Date: 12/07/2021 ?Time: 1320-1350 ?SLP Time Calculation (min) (ACUTE ONLY): 30 min ? ?Assessment / Plan / Recommendation ?Clinical Impression ? Pt seen for cognitive-linguistic tx. Pt alert, pleasant, and cooperative. Tearful at times. Daughter present.  ?  ?Pt to continues to make improvements in cognitive-linguistic functioning; however, reduced short term memory, problem solving/executive functioning, and abstract reasoning persist. During verbal problem solving tasks, pt able to generate logical verbal solutions to routine problems 60% of the time without cues; 100% of the time with verbal cues; pt with increased difficulty generating alternate/multiple solutions to problems and able to do so in 1/3 trials with cueing. Pt continues with inconsistent recall of daily events and events leading up to hospitalization. Daughter agrees pt nearing cognitive-linguistic baseline; however, deficits persist. ?  ?Based on today's tx, anticipate need for 24 hour supervision/assistance and post-acute SLP services for higher level cognitive-linguistic assessment/tx. SLP to continue for f/u per POC for cognitive-linguistic tx. ?  ?Pt and daughter aware of results, recommendations, and SLP POC. Both verbailzed understanding/agreement. ? ?Of note, pt's daughter with questions for [facility] regarding community resources for d/c. TOC contacted via [facility]. ?   ?HPI HPI: This is a 76 year old woman with past medical history significant for depression, anxiety, hyperlipidemia, previous tiny acute infarct in the right PCA distribution with no residual deficits who is admitted for encephalopathy.  At baseline she is independent and lives alone.  She was previously diagnosed with mild cognitive impairment but is able to speak in function with minimal issues.  She does occasionally display emotional lability and say  inappropriate things at times however these have been ongoing issues.  Last known well was 2 to 3 days prior to presentation when family saw her last.  She was brought in by EMS after she was discovered by a [employer] cable guy he saw her through a window standing naked in the living room and acting abnormally.  In the ED she was essentially globally aphasic.  She also had notable weakness in the right upper and lower extremity. CT head and MRI brain wo contrast showed NAICP.  CNS imaging was personally reviewed.  UA not c/f infection. Blood cultures x2 pending. No lab disturbances to explain presentation UDS pan-negative. Ammonia, TSH WNL.  White blood cell count was 9 on admission and increased to 16 today without clear reason.  No source for infection has been identified. MRI 11/30/21 revealing "Normal MRI appearance of the cervical spinal cord. No abnormal  enhancement." EEG 12/01/21: "This EEG was obtained while awake and asleep and is abnormal due to intermittent left frontotemporal slowing, at times in the delta range, indicating focal dysfunction or potential underlying structural abnormality in this area." CSF w/ normal cell count, not c/w bacterial meningitis. ?  ?   ?SLP Plan ? Continue with current plan of care ? ?  ?  ?Recommendations for follow up therapy are one component of a multi-disciplinary discharge planning process, led by the attending physician.  Recommendations may be updated based on patient status, additional functional criteria and insurance authorization. ?  ? ?Recommendations  ?   ? ? ? ? Follow Up Recommendations: Home health SLP ?Assistance recommended at discharge: Frequent or constant Supervision/Assistance ?SLP Visit Diagnosis: Cognitive communication deficit (R41.841) ?Plan: Continue with current plan of care ? ? ? ? ?  ?  ? ?Cherrie Gauze, M.S., CCC-SLP ?Speech-Language Pathologist ?[place]  [facility] ?[phone])  ? ?Quintella Baton ? ?12/07/2021, 2:09 PM ?

## 2021-12-07 NOTE — Progress Notes (Signed)
Physical Therapy Treatment ?Patient Details ?Name: Teresa Sherman ?MRN: [id number] ?DOB: 06-Apr-1946 ?Today's Date: 12/07/2021 ? ? ?History of Present Illness [name] is a 76 year old female with history of stroke, hyperlipidemia, insomnia, who presents to the emergency department for chief concerns of altered mental status. ? ?  ?PT Comments  ? ? Pt up in bathroom upon arrival.  RN in room. Some unsteadiness in bathroom but able to self correct.  She walks 100' in hallway with no AD and generally cautious gait.  She returns to room and is encouraged to try RW.  She walks x 1 lap, to room to brush teeth then continues another lap on unit.  Gait much improved with RW and education given on it's use.  She did report feeling better with it and is encouraged to use it initially upon returning home.  Voices understanding. ? ?Pt did state her daughter did not want her to return home.  She is moving well with +1 supervision and at this time is appropriate for home with HHPT and increased supervision in the home.  If family remains concerned, ALF may be a good transition. ?  ?Recommendations for follow up therapy are one component of a multi-disciplinary discharge planning process, led by the attending physician.  Recommendations may be updated based on patient status, additional functional criteria and insurance authorization. ? ?Follow Up Recommendations ? Home health PT ?  ?  ?Assistance Recommended at Discharge Frequent or constant Supervision/Assistance  ?Patient can return home with the following A little help with walking and/or transfers;A little help with bathing/dressing/bathroom;Help with stairs or ramp for entrance;Assist for transportation ?  ?Equipment Recommendations ? Rolling walker (2 wheels)  ?  ?Recommendations for Other Services   ? ? ?  ?Precautions / Restrictions Precautions ?Precautions: Fall ?Restrictions ?Weight Bearing Restrictions: No  ?  ? ?Mobility ? Bed Mobility ?  ?  ?  ?  ?  ?   ?  ?General bed mobility comments: up with nurse in bathroom and remains in chair after session.  anticipate no difficulty based on mobility ?  ? ?Transfers ?Overall transfer level: Needs assistance ?Equipment used: 1 person hand held assist ?Transfers: Sit to/from Stand ?Sit to Stand: Min guard ?  ?  ?  ?  ?  ?  ?  ? ?Ambulation/Gait ?Ambulation/Gait assistance: Min guard ?Gait Distance (Feet): 200 Feet ?Assistive device: Rolling walker (2 wheels), None ?Gait Pattern/deviations: Step-through pattern, Decreased stride length ?Gait velocity: decreased ?  ?  ?General Gait Details: 100' no AD,  200' x 2 with RW with improved gait and confidence ? ? ?Stairs ?  ?  ?  ?  ?  ? ? ?Wheelchair Mobility ?  ? ?Modified Rankin (Stroke Patients Only) ?  ? ? ?  ?Balance Overall balance assessment: Needs assistance ?Sitting-balance support: Feet supported ?Sitting balance-Leahy Scale: Good ?  ?  ?Standing balance support: During functional activity, Bilateral upper extremity supported ?Standing balance-Leahy Scale: Fair ?  ?  ?  ?  ?  ?  ?  ?  ?  ?  ?  ?  ?  ? ?  ?Cognition Arousal/Alertness: Awake/alert ?Behavior During Therapy: [facility] for tasks assessed/performed ?Overall Cognitive Status: Within Functional Limits for tasks assessed ?  ?  ?  ?  ?  ?  ?  ?  ?  ?  ?  ?  ?  ?  ?  ?  ?  ?  ?  ? ?  ?Exercises Other  Exercises ?Other Exercises: bathroom to void then sink to brush teeth ? ?  ?General Comments   ?  ?  ? ?Pertinent Vitals/Pain Pain Assessment ?Pain Assessment: No/denies pain  ? ? ?Home Living   ?  ?  ?  ?  ?  ?  ?  ?  ?  ?   ?  ?Prior Function    ?  ?  ?   ? ?PT Goals (current goals can now be found in the care plan section) Progress towards PT goals: Progressing toward goals ? ?  ?Frequency ? ? ? 7X/week ? ? ? ?  ?PT Plan Current plan remains appropriate  ? ? ?Co-evaluation   ?  ?  ?  ?  ? ?  ?AM-PAC PT "6 Clicks" Mobility   ?Outcome Measure ? Help needed turning from your back to your side while in a flat bed without using  bedrails?: None ?Help needed moving from lying on your back to sitting on the side of a flat bed without using bedrails?: None ?Help needed moving to and from a bed to a chair (including a wheelchair)?: None ?Help needed standing up from a chair using your arms (e.g., wheelchair or bedside chair)?: None ?Help needed to walk in hospital room?: A Little ?Help needed climbing 3-5 steps with a railing? : A Little ?6 Click Score: 22 ? ?  ?End of Session Equipment Utilized During Treatment: Gait belt ?Activity Tolerance: Patient tolerated treatment well ?Patient left: in chair;with call bell/phone within reach;with chair alarm set ?Nurse Communication: Mobility status ?PT Visit Diagnosis: Unsteadiness on feet (R26.81);Other abnormalities of gait and mobility (R26.89);Muscle weakness (generalized) (M62.81);Difficulty in walking, not elsewhere classified (R26.2);Hemiplegia and hemiparesis ?Hemiplegia - Right/Left: Right ?Hemiplegia - dominant/non-dominant: Dominant ?Hemiplegia - caused by: Unspecified ?  ? ? ?Time: 0459-9774 ?PT Time Calculation (min) (ACUTE ONLY): 18 min ? ?Charges:  $Gait Training: 8-22 mins          ?         Chesley Noon, PTA ?12/07/21, 9:06 AM ? ?

## 2021-12-08 DIAGNOSIS — F418 Other specified anxiety disorders: Secondary | ICD-10-CM | POA: Diagnosis not present

## 2021-12-08 DIAGNOSIS — R4182 Altered mental status, unspecified: Secondary | ICD-10-CM | POA: Diagnosis not present

## 2021-12-08 MED ORDER — GUAIFENESIN 100 MG/5ML PO LIQD: 5.0000 mL | ORAL | 0 refills | Status: AC | PRN

## 2021-12-08 MED ORDER — ONDANSETRON 4 MG PO TBDP
4.0000 mg | ORAL_TABLET | Freq: Once | ORAL | Status: AC
  Administered 2021-12-08: 4 mg via ORAL
  Filled 2021-12-08: qty 1

## 2021-12-08 MED ORDER — AMOXICILLIN-POT CLAVULANATE 875-125 MG PO TABS: 1.0000 | ORAL_TABLET | Freq: Two times a day (BID) | ORAL | 0 refills | Status: AC

## 2021-12-08 NOTE — Progress Notes (Signed)
Physical Therapy Treatment ?Patient Details ?Name: Teresa Sherman ?MRN: [id number] ?DOB: [birth year] ?Today's Date: 12/08/2021 ? ? ?History of Present Illness Teresa Sherman is a 76 year old female with history of stroke, hyperlipidemia, insomnia, who presents to the emergency department for chief concerns of altered mental status. ? ?  ?PT Comments  ? ? Pt received in Semi-Fowler's position and agreeable to therapy.  Pt able to perform mobility with good technique.  Pt challenged with head turns and nods throughout session.  Pt also given cognitive tasks of naming animals and counting during ambulation.  Pt does still have memory difficulties as noted in other sessions.  Current discharge plans to home with HHPT and increased supervision remain appropriate at this time.  Pt will continue to benefit from skilled therapy in order to address deficits listed below. ? ?  ?Recommendations for follow up therapy are one component of a multi-disciplinary discharge planning process, led by the attending physician.  Recommendations may be updated based on patient status, additional functional criteria and insurance authorization. ? ?Follow Up Recommendations ? Home health PT ?  ?  ?Assistance Recommended at Discharge Frequent or constant Supervision/Assistance  ?Patient can return home with the following A little help with walking and/or transfers;A little help with bathing/dressing/bathroom;Help with stairs or ramp for entrance;Assist for transportation ?  ?Equipment Recommendations ? Rolling walker (2 wheels)  ?  ?Recommendations for Other Services   ? ? ?  ?Precautions / Restrictions Precautions ?Precautions: Fall ?Restrictions ?Weight Bearing Restrictions: No  ?  ? ?Mobility ? Bed Mobility ?Overal bed mobility: Modified Independent ?  ?  ?  ?  ?  ?  ?  ?  ? ?Transfers ?Overall transfer level: Modified independent ?  ?  ?  ?  ?  ?  ?  ?  ?  ?  ? ?Ambulation/Gait ?Ambulation/Gait assistance: Min guard ?Gait Distance  (Feet): 420 Feet ?Assistive device: None ?Gait Pattern/deviations: Step-through pattern, Decreased stride length ?Gait velocity: decreased ?  ?  ?General Gait Details: 280 with cognitive tasks, naming animals, head turns/nods, etc. ? ? ?Stairs ?  ?  ?  ?  ?  ? ? ?Wheelchair Mobility ?  ? ?Modified Rankin (Stroke Patients Only) ?  ? ? ?  ?Balance Overall balance assessment: Needs assistance ?Sitting-balance support: Feet supported ?Sitting balance-Leahy Scale: Good ?  ?  ?Standing balance support: During functional activity, Bilateral upper extremity supported ?Standing balance-Leahy Scale: Fair ?  ?  ?  ?  ?  ?  ?  ?  ?  ?  ?  ?  ?  ? ?  ?Cognition Arousal/Alertness: Awake/alert ?Behavior During Therapy: [facility] for tasks assessed/performed ?Overall Cognitive Status: Within Functional Limits for tasks assessed ?  ?  ?  ?  ?  ?  ?  ?  ?  ?  ?  ?  ?  ?  ?  ?  ?  ?  ?  ? ?  ?Exercises   ? ?  ?General Comments   ?  ?  ? ?Pertinent Vitals/Pain Pain Assessment ?Pain Assessment: No/denies pain  ? ? ?Home Living   ?  ?  ?  ?  ?  ?  ?  ?  ?  ?   ?  ?Prior Function    ?  ?  ?   ? ?PT Goals (current goals can now be found in the care plan section) Progress towards PT goals: Progressing toward goals ? ?  ?Frequency ? ? ?  7X/week ? ? ? ?  ?PT Plan Current plan remains appropriate  ? ? ?Co-evaluation   ?  ?  ?  ?  ? ?  ?AM-PAC PT "6 Clicks" Mobility   ?Outcome Measure ? Help needed turning from your back to your side while in a flat bed without using bedrails?: None ?Help needed moving from lying on your back to sitting on the side of a flat bed without using bedrails?: None ?Help needed moving to and from a bed to a chair (including a wheelchair)?: None ?Help needed standing up from a chair using your arms (e.g., wheelchair or bedside chair)?: None ?Help needed to walk in hospital room?: A Little ?Help needed climbing 3-5 steps with a railing? : A Little ?6 Click Score: 22 ? ?  ?End of Session Equipment Utilized During Treatment:  Gait belt ?Activity Tolerance: Patient tolerated treatment well ?Patient left: with call bell/phone within reach;in bed;with bed alarm set ?Nurse Communication: Mobility status ?PT Visit Diagnosis: Unsteadiness on feet (R26.81);Other abnormalities of gait and mobility (R26.89);Muscle weakness (generalized) (M62.81);Difficulty in walking, not elsewhere classified (R26.2);Hemiplegia and hemiparesis ?Hemiplegia - Right/Left: Right ?Hemiplegia - dominant/non-dominant: Dominant ?Hemiplegia - caused by: Unspecified ?  ? ? ?Time: 2878-6767 ?PT Time Calculation (min) (ACUTE ONLY): 23 min ? ?Charges:  $Neuromuscular Re-education: 8-22 mins          ?          ? ?Gwenlyn Saran, PT, DPT ?12/08/21, 1:52 PM ? ? ? ?Teresa Sherman ?12/08/2021, 1:49 PM ? ?

## 2021-12-08 NOTE — TOC Progression Note (Signed)
Transition of Care (TOC) - Progression Note  ? ? ?Patient Details  ?Name: Teresa Sherman ?MRN: [id number] ?Date of Birth: [birth year] ? ?Transition of Care (TOC) CM/SW Contact  ?Pete Pelt, RN ?Phone Number: ?12/08/2021, 12:17 PM ? ?Clinical Narrative:   Patient set up with [facility], rolling walker will be delivered to room as per adapt. ? ? ? ?Expected Discharge Plan: [place] ?Barriers to Discharge: Continued Medical Work up ? ?Expected Discharge Plan and Services ?Expected Discharge Plan: [place] ?  ?  ?  ?Living arrangements for the past 2 months: [place] ?Expected Discharge Date: 12/08/21               ?  ?  ?  ?  ?  ?  ?[place] Agency: [facility] ?Date HH Agency Contacted: 12/04/21 ?Time HH Agency Contacted: 0900 ?Representative spoke with at [place]: Judson Roch ? ? ?Social Determinants of Health (SDOH) Interventions ?  ? ?Readmission Risk Interventions ?   ? View : No data to display.  ?  ?  ?  ? ? ?

## 2021-12-08 NOTE — Discharge Summary (Signed)
? ?Physician Discharge Summary  ?Teresa Sherman GQQ:[id number] DOB: June 01, 1946 DOA: 11/29/2021 ? ?PCP: System, Provider Not In ? ?Admit date: 11/29/2021 ?Discharge date: 12/08/2021 ? ?Admitted From: home ?Disposition:  home ? ?Recommendations for Outpatient Follow-up:  ?Follow up with PCP in 1-2 weeks ?Please obtain BMP/CBC in one week ? ?Home Health: PT ?Equipment/Devices: none ? ?Discharge Condition: stable ?CODE STATUS: DNR ?Diet recommendation: regular ? ?HPI: Per admitting MD, ?[name] is a 76 year old female with history of stroke, hyperlipidemia, insomnia, who presents to the emergency department for chief concerns of altered mental status. Initial vitals in the emergency department showed temperature of 98, respiration rate of 25, blood pressure 168/118, SPO2 of 98% on room air. Serum sodium 132, potassium 4.1, chloride 98, bicarb 24, BUN of 20, serum creatinine of 0.70, GFR greater than 60, nonfasting blood glucose 105, WBC 9, hemoglobin 14.3, platelets of 215. COVID/influenza A/influenza B PCR were negative. T. bili mildly elevated at 1.6. TSH was within normal limits. CT of the head and neck with contrast in the ED was read as: Normal head CT for age.  Normal CT angiography of the head and neck. Portable chest x-ray: Bibasilar platelike opacities, likely atelectasis.  Differential considerations include infection and aspiration.  Age-indeterminate compression fracture deformity of the mild thoracic vertebral body. ? ?Hospital Course / Discharge diagnoses: ?Principal Problem: ?  Altered mental status ?Active Problems: ?  Depression with anxiety ?  Disordered sleep ?  Hyperlipidemia ?  History of stroke ?  Insomnia ? ? ?Assessment and Plan: ?Principal problem ?Altered mental status, aphasia, right upper extremity weakness -neurology consulted and following.  She underwent extensive imaging of the brain which was fairly unremarkable.  Initially she was placed on broad-spectrum antibiotics,  underwent an LP on 4/11.  LP did not show evidence of bacterial meningitis, HSV PCR was negative and all antibiotics and antivirals were discontinued.  TSH, VDRL, unremarkable.  Currently oligoclonal bands are pending, meningitis / encephalitis panel pending, and these need to be followed as an outpatient.  Hospital course complicated by worsening encephalopathy and hallucinations, for which psychiatry was consulted.  It was thought to be due to profound insomnia/lack of sleep, she was given asenapine x1, has had few good nights of sleep and currently improving and has returned to baseline.  Of note, patient has a phobia of bugs and apparently she is using excessive amounts of bug sprays in the home raising concern about a toxidrome.  With improvement and mental status returning to baseline will be discharged home in stable condition.  On the day of discharge she is ambulating in the hallway with PT and without assistive devices ? ?Active problems ?History of stroke -continue aspirin, atorvastatin ?Leukocytosis possibly due to aspiration pneumonia-she had a new elevation in her white count 4/15, increased cough, her chest x-ray shows potential infiltrate I wonder whether she had a mild aspiration event while being confused.  She was placed on Augmentin, continue for 3 additional days following discharge ?Hyperlipidemia -continue statin ?Disordered sleep -on trazodone ?ESBL E. coli in the urine cultures-these were sent when she had white count elevation.  No infectious type symptoms, likely colonizer.  WBC improved with Augmentin for aspiration pneumonia as above ? ?Sepsis ruled out ? ? ?Discharge Instructions ? ? ?Allergies as of 12/08/2021   ? ?   Reactions  ? Hydroxyzine   ? Other reaction(s): Dizziness ?Patient reports severe dizziness and N/V after taking Hydroxyzine 25 mg. 24 hours later feels some better ?Patient reports  severe dizziness and N/V after taking Hydroxyzine 25 mg. 24 hours later feels some better   ? Codeine Nausea And Vomiting  ? migraine  ? Duloxetine Hcl Nausea And Vomiting  ? Monosodium Glutamate   ? migraine  ? Nitrates, Organic   ? migraine  ? Other   ? *says can ONLY EAT ORGANIC THINGS ONLY. All other foods, non-organic products will make her sick  ? Phosphorus   ? migraine  ? Sodium Benzoate [nutritional Supplements]   ? migraine  ? Sodium Phosphates   ? migraine  ? Zithromax [azithromycin]   ? Migraine  ? ?  ? ?  ?Medication List  ?  ? ?TAKE these medications   ? ?acetaminophen 500 MG tablet ?Commonly known as: TYLENOL ?Take 1,000 mg by mouth every 6 (six) hours as needed for fever or pain. ?  ?Aimovig 140 MG/ML Soaj ?Generic drug: Erenumab-aooe ?Inject 140 mg into the skin every 28 (twenty-eight) days. ?  ?amoxicillin-clavulanate 875-125 MG tablet ?Commonly known as: AUGMENTIN ?Take 1 tablet by mouth every 12 (twelve) hours for 3 days. ?  ?aspirin 81 MG chewable tablet ?Chew 81 mg by mouth daily. ?  ?aspirin-acetaminophen-caffeine 250-250-65 MG tablet ?Commonly known as: Freder[place]on ?Take by mouth every 6 (six) hours as needed for headache. ?  ?atorvastatin 40 MG tablet ?Commonly known as: LIPITOR ?Take 40 mg by mouth daily. ?  ?Azelastine HCl 137 MCG/SPRAY Soln ?Place 1 spray into both nostrils at bedtime. ?  ?Biotin 10000 MCG Tbdp ?Take 1 tablet by mouth every morning. ?  ?guaiFENesin 100 MG/5ML liquid ?Commonly known as: ROBITUSSIN ?Take 5 mLs by mouth every 4 (four) hours as needed for cough or to loosen phlegm. ?  ?melatonin 1 MG Tabs tablet ?Take 0.5 mg by mouth at bedtime. ?  ?minoxidil 2.5 MG tablet ?Commonly known as: LONITEN ?Take 1/2 pill once daily. ?  ?traZODone 50 MG tablet ?Commonly known as: DESYREL ?Take 25 mg by mouth at bedtime as needed for sleep. ?  ? ?  ? ?  ?  ? ? ?  ?Durable Medical Equipment  ?(From admission, onward)  ?  ? ? ?  ? ?  Start     Ordered  ? 12/07/21 1210  For home use only DME Walker rolling  Once       ?Question Answer Comment  ?Walker: With 5 Inch  Wheels   ?Patient needs a walker to treat with the following condition Weakness   ?  ? 12/07/21 1209  ? ?  ?  ? ?  ? ? ? ?Consultations: ?Neurology ? ?Procedures/Studies: ? ?CT ANGIO HEAD NECK W WO CM ? ?Result Date: 11/29/2021 ?CLINICAL DATA:  Neuro deficit, acute, stroke suspected. Abnormal behavior. EXAM: CT ANGIOGRAPHY HEAD AND NECK TECHNIQUE: Multidetector CT imaging of the head and neck was performed using the standard protocol during bolus administration of intravenous contrast. Multiplanar CT image reconstructions and MIPs were obtained to evaluate the vascular anatomy. Carotid stenosis measurements (when applicable) are obtained utilizing NASCET criteria, using the distal internal carotid diameter as the denominator. RADIATION DOSE REDUCTION: This exam was performed according to the departmental dose-optimization program which includes automated exposure control, adjustment of the mA and/or kV according to patient size and/or use of iterative reconstruction technique. CONTRAST:  37m OMNIPAQUE IOHEXOL 350 MG/ML SOLN COMPARISON:  Head CT 10/22/2018 FINDINGS: CT HEAD FINDINGS Brain: Mild age related volume loss. No evidence of old or acute focal infarction, mass lesion, hemorrhage, hydrocephalus or extra-axial collection. Vascular: No  abnormal vascular finding. Skull: Normal Sinuses: Clear Orbits: Normal Review of the MIP images confirms the above findings CTA NECK FINDINGS Aortic arch: Aortic arch is normal. Right carotid system: Common carotid artery is normal. Minimal calcified plaque at the bifurcation but no stenosis. Cervical ICA is normal. Left carotid system: Common carotid artery widely patent to the bifurcation. The bifurcation is normal. Cervical ICA is normal. Vertebral arteries: Both vertebral arteries are patent at their origins and through the cervical region. The left vertebral artery is strongly dominant. Skeleton: Minimal spondylosis. Other neck: No mass or lymphadenopathy. Upper chest:  Negative Review of the MIP images confirms the above findings CTA HEAD FINDINGS Anterior circulation: Both internal carotid arteries are patent through the skull base and siphon regions. No siphon stenosis. The anteri

## 2021-12-09 LAB — URINE CULTURE: Culture: 10000 — AB

## 2021-12-11 LAB — MISC LABCORP TEST (SEND OUT)
LabCorp test name: 2013305
Labcorp test code: 9985
Labcorp test code: 9985

## 2021-12-30 LAB — MISC LABCORP TEST (SEND OUT): Labcorp test code: 9985

## 2022-03-07 ENCOUNTER — Ambulatory Visit
Admission: EM | Admit: 2022-03-07 | Discharge: 2022-03-07 | Disposition: A | Discharge status: Home / Self Care | Payer: Medicare HMO

## 2022-03-07 DIAGNOSIS — S8011XA Contusion of right lower leg, initial encounter: Secondary | ICD-10-CM

## 2022-03-07 DIAGNOSIS — M7989 Other specified soft tissue disorders: Secondary | ICD-10-CM

## 2022-03-07 NOTE — ED Triage Notes (Signed)
Pt states that she has some right leg bruising and pain. X2 days  Pt states that she doesn't remember injuring it.

## 2022-03-07 NOTE — ED Provider Notes (Signed)
[facility]    CSN: [id number] Arrival date & time: 03/07/22  1002      History   Chief Complaint Chief Complaint  Patient presents with   leg bruising    Right leg bruising and pain. X2 days    HPI Kele Ortencia Askari is a 76 y.o. female.   Patient presents with concerns regarding a bruise to the right lower extremity noticed 2 days ago while washing.  Endorses site is tender and that she is able to notate and not.  Initially 2 kn were present but 1 has resolved .  Denies swelling or warmth to the skin .  Attempted to ice which has been minimally helpful.  Takes daily baby aspirin, denies use of blood thinners.  Patient concerned with bilateral feet swelling for greater than 2 weeks.  Unsure if related to salt intake as she endorses that she eats pretzels daily.  Range of motion of the feet is intact.  Denies numbness or tingling.  Has attempted use of compression wraps which has been somewhat helpful.  Denies renal or cardiac history.  Past Medical History:  Diagnosis Date   Allergy    Anxiety    Cataract    Combined fat and carbohydrate induced hyperlipemia 01/26/2015   Depression    GERD (gastroesophageal reflux disease)     Patient Active Problem List   Diagnosis Date Noted   Insomnia 12/04/2021   Altered mental status 11/29/2021   Hyperlipidemia 11/29/2021   History of stroke 11/29/2021   Fibrocystic breast 01/26/2015   History of colon polyps 01/26/2015   Migraine aura without headache 01/26/2015   Combined fat and carbohydrate induced hyperlipemia 01/26/2015   Encounter for aftercare for long-term (current) use of antibiotics 01/26/2015   Neoplasm of uncertain behavior of skin 01/26/2015   Colonic constipation 01/26/2015   Disordered sleep 03/06/2014   Spinal stenosis of lumbar region 07/05/2013   Anxiety state, unspecified 07/05/2013   Depression with anxiety 07/05/2013   Unspecified constipation 07/05/2013   Gait instability 07/05/2013    Fusion of spine of lumbar region 07/05/2013    Past Surgical History:  Procedure Laterality Date   LUMBAR FUSION  05/2013   L1-L5 burst fx from [place] Left    TUBAL LIGATION      OB History   No obstetric history on file.      Home Medications    Prior to Admission medications   Medication Sig Start Date End Date Taking? Authorizing Provider  acetaminophen (TYLENOL) 500 MG tablet Take 1,000 mg by mouth every 6 (six) hours as needed for fever or pain. 10/31/21  Yes [provider]  aspirin 81 MG chewable tablet Chew 81 mg by mouth daily. 10/31/21  Yes [provider]  aspirin-acetaminophen-caffeine (EXCEDRIN MIGRAINE) [phone] MG tablet Take by mouth every 6 (six) hours as needed for headache.   Yes [provider]  atorvastatin (LIPITOR) 40 MG tablet Take 40 mg by mouth daily. 11/26/21  Yes [provider]  Azelastine HCl 137 MCG/SPRAY SOLN Place 1 spray into both nostrils at bedtime. 07/10/21  Yes [provider]  Biotin 10000 MCG TBDP Take 1 tablet by mouth every morning. 10/31/21  Yes [provider]  Erenumab-aooe (AIMOVIG) 140 MG/ML SOAJ Inject 140 mg into the skin every 28 (twenty-eight) days. 09/04/21 09/04/22 Yes [provider]  guaiFENesin (ROBITUSSIN) 100 MG/5ML liquid Take 5 mLs by mouth every 4 (four) hours as needed for cough or to  loosen phlegm. 12/08/21  Yes Gherghe, Vella Redhead, MD  melatonin 1 MG TABS tablet Take 0.5 mg by mouth at bedtime.   Yes [provider]  minoxidil (LONITEN) 2.5 MG tablet Take 1/2 pill once daily. 06/08/21  Yes Ralene Bathe, MD  traZODone (DESYREL) 50 MG tablet Take 25 mg by mouth at bedtime as needed for sleep. 07/24/21  Yes [provider]  SUMAtriptan (IMITREX) 20 MG/ACT nasal spray Place 1 spray into the nose every 2 (two) hours as needed for migraine.   02/15/19  [provider]    Family History Family History  Problem Relation  Age of Onset   COPD Mother    Hypertension Mother    Osteoarthritis Mother    CAD Father    Cancer Brother     Social History Social History   Tobacco Use   Smoking status: Never   Smokeless tobacco: Never  Vaping Use   Vaping Use: Never used  Substance Use Topics   Alcohol use: No    Alcohol/week: 0.0 standard drinks of alcohol   Drug use: No     Allergies   Hydroxyzine; Codeine; Duloxetine hcl; Monosodium glutamate; Nitrates, organic; Other; Phosphorus; Sodium benzoate [nutritional supplements]; Sodium phosphates; and Zithromax [azithromycin]   Review of Systems Review of Systems Defer to HPI    Physical Exam Triage Vital Signs ED Triage Vitals  Enc Vitals Group     BP 03/07/22 1021 137/81     Pulse Rate 03/07/22 1021 92     Resp 03/07/22 1021 18     Temp 03/07/22 1021 97.8 F (36.6 C)     Temp Source 03/07/22 1021 Oral     SpO2 03/07/22 1021 95 %     Weight 03/07/22 1017 120 lb (54.4 kg)     Height 03/07/22 1017 5' (1.524 m)     Head Circumference --      Peak Flow --      Pain Score 03/07/22 1016 0     Pain Loc --      Pain Edu? --      Excl. in [place]? --    No data found.  Updated Vital Signs BP 137/81 (BP Location: Right Arm)   Pulse 92   Temp 97.8 F (36.6 C) (Oral)   Resp 18   Ht 5' (1.524 m)   Wt 120 lb (54.4 kg)   SpO2 95%   BMI 23.44 kg/m   Visual Acuity Right Eye Distance:   Left Eye Distance:   Bilateral Distance:    Right Eye Near:   Left Eye Near:    Bilateral Near:     Physical Exam Constitutional:      Appearance: Normal appearance.  HENT:     Head: Normocephalic.  Eyes:     Extraocular Movements: Extraocular movements intact.  Pulmonary:     Effort: Pulmonary effort is normal.  Skin:    Comments: 4 x 5 contusion present to the posterior aspect of the right lower extremity, yellow to purple color, tender to palpation, superficial hematoma noted, 2+ popliteal pulse, range of motion intact, able to bear weight  2+  bilateral edema noted to the feet, sensation is intact, 2+ dorsalis pedis and pedal pulses, able to bear weight, range of motion of the bilateral ankles intact  Neurological:     Mental Status: She is alert and oriented to person, place, and time. Mental status is at baseline.  Psychiatric:        Mood  and Affect: Mood normal.        Behavior: Behavior normal.      UC Treatments / Results  Labs (all labs ordered are listed, but only abnormal results are displayed) Labs Reviewed - No data to display  EKG   Radiology No results found.  Procedures Procedures (including critical care time)  Medications Ordered in UC Medications - No data to display  Initial Impression / Assessment and Plan / UC Course  I have reviewed the triage vital signs and the nursing notes.  Pertinent labs & imaging results that were available during my care of the patient were reviewed by me and considered in my medical decision making (see chart for details).  Contusion of the right lower extremity, initial encounter Bilateral swelling of feet  Contusion is most likely due to an injury even though patient endorses that she does not recall having one, low suspicion of abnormality to the blood, discussed with patient, hematoma is superficial most likely will not cause complication, recommended continued use of ice over the affected area as well as compression and a watchful wait, may follow-up for reevaluation as needed  2+ edema is noted to the bilateral feet, no further abnormality, low suspicion of cardiac or renal involvement due to lack of history and accompanying symptoms, discussed with patient, recommended follow-up with primary doctor, patient is currently transitioning needing PCPs as hers has retired from [employer], PCP referral placed in system, recommended supportive care for management with follow-up as needed Final Clinical Impressions(s) / UC Diagnoses   Final diagnoses:  None   Discharge  Instructions   None    ED Prescriptions   None    PDMP not reviewed this encounter.   Hans Eden, NP 03/07/22 1158

## 2022-03-07 NOTE — Discharge Instructions (Signed)
For your bruise -Your bruise should improve with time, the knot that you feel is a superficial hematoma which generally does not cause any complications and will resolve in time - You may use ice or heat over the affected area 10 to 15-minute intervals, whichever provides you the most comfort - You may continue to use compression socks if you find this comforting, may also wrap area with an Ace wrap -May follow-up for any further concerns  For your feet - I have low suspicion that this is related to your heart or kidneys due to your lack of  medical history and no accompanying symptoms -You may continue use of compression stockings which will help with blood flow and help to reduce swelling -You may elevate when sitting and lying which will help to reduce the swelling -Decrease your salt intake as this may cause your symptoms to worsen -May follow-up as needed

## 2022-03-24 ENCOUNTER — Ambulatory Visit: Payer: Medicare HMO | Admitting: Dermatology

## 2022-11-14 ENCOUNTER — Encounter: Payer: Self-pay | Admitting: Emergency Medicine

## 2022-11-14 ENCOUNTER — Ambulatory Visit
Admission: EM | Admit: 2022-11-14 | Discharge: 2022-11-14 | Disposition: A | Discharge status: Home / Self Care | Payer: Medicare HMO

## 2022-11-14 DIAGNOSIS — T887XXA Unspecified adverse effect of drug or medicament, initial encounter: Secondary | ICD-10-CM

## 2022-11-14 NOTE — ED Triage Notes (Signed)
Patient states that she started a new medicine on 11/10/22 called Donepezil .  Patient states that she read on the label that she could not stop the Donepezil.  Patient states that since she started the medicine she has been having nightmares and waking up screaming.  Patient is only taking a half tablet.  Patient states that every night her nightmares have gotten worse.

## 2022-11-14 NOTE — Discharge Instructions (Addendum)
Stop the Aricept and follow-up with your provider who prescribes this to discuss different treatment options

## 2022-11-14 NOTE — ED Provider Notes (Signed)
[facility]    CSN: [id number] Arrival date & time: 11/14/22  1103      History   Chief Complaint Chief Complaint  Patient presents with   Medication Reaction    HPI Teresa Sherman is a 77 y.o. female presents for evaluation of a side effect of her Aricept.  Patient was started on Aricept for memory changes on 3/19.  She was started on 5 mg.  She took the dose on the 19th missed the 20th and has since taken it for the past 4 nights.  She reports she is having horrible nightmares waking up crying and shaking.  Denies any history of nightmares prior to this.  No chest pain, shortness of breath, lip/tongue/throat swelling.  She called the triage nurse line from her doctor's office and was told to come to urgent care.  Last dose of medication was last night.  She states she has an appointment tomorrow with her provider.  No other concerns at this time.  HPI  Past Medical History:  Diagnosis Date   Allergy    Anxiety    Cataract    Combined fat and carbohydrate induced hyperlipemia 01/26/2015   Depression    GERD (gastroesophageal reflux disease)     Patient Active Problem List   Diagnosis Date Noted   Insomnia 12/04/2021   Altered mental status 11/29/2021   Hyperlipidemia 11/29/2021   History of stroke 11/29/2021   Fibrocystic breast 01/26/2015   History of colon polyps 01/26/2015   Migraine aura without headache 01/26/2015   Combined fat and carbohydrate induced hyperlipemia 01/26/2015   Encounter for aftercare for long-term (current) use of antibiotics 01/26/2015   Neoplasm of uncertain behavior of skin 01/26/2015   Colonic constipation 01/26/2015   Disordered sleep 03/06/2014   Spinal stenosis of lumbar region 07/05/2013   Anxiety state, unspecified 07/05/2013   Depression with anxiety 07/05/2013   Unspecified constipation 07/05/2013   Gait instability 07/05/2013   Fusion of spine of lumbar region 07/05/2013    Past Surgical History:  Procedure  Laterality Date   LUMBAR FUSION  05/2013   L1-L5 burst fx from [place] Left    TUBAL LIGATION      OB History   No obstetric history on file.      Home Medications    Prior to Admission medications   Medication Sig Start Date End Date Taking? Authorizing Provider  acetaminophen (TYLENOL) 500 MG tablet Take 1,000 mg by mouth every 6 (six) hours as needed for fever or pain. 10/31/21  Yes [provider]  aspirin-acetaminophen-caffeine (EXCEDRIN MIGRAINE) [phone] MG tablet Take by mouth every 6 (six) hours as needed for headache.   Yes [provider]  atorvastatin (LIPITOR) 40 MG tablet Take 40 mg by mouth daily. 11/26/21  Yes [provider]  Biotin 10000 MCG TBDP Take 1 tablet by mouth every morning. 10/31/21  Yes [provider]  donepezil (ARICEPT) 10 MG tablet Take 5 MG HS for 4 weeks, then 10 MG HS PO. 11/09/22 11/09/23 Yes [provider]  aspirin 81 MG chewable tablet Chew 81 mg by mouth daily. 10/31/21   [provider]  Azelastine HCl 137 MCG/SPRAY SOLN Place 1 spray into both nostrils at bedtime. 07/10/21   [provider]  guaiFENesin (ROBITUSSIN) 100 MG/5ML liquid Take 5 mLs by mouth every 4 (four) hours as needed for cough or to loosen phlegm. 12/08/21   Caren Griffins, MD  melatonin 1 MG  TABS tablet Take 0.5 mg by mouth at bedtime.    [provider]  minoxidil (LONITEN) 2.5 MG tablet Take 1/2 pill once daily. 06/08/21   Ralene Bathe, MD  traZODone (DESYREL) 50 MG tablet Take 25 mg by mouth at bedtime as needed for sleep. 07/24/21   [provider]  SUMAtriptan (IMITREX) 20 MG/ACT nasal spray Place 1 spray into the nose every 2 (two) hours as needed for migraine.   02/15/19  [provider]    Family History Family History  Problem Relation Age of Onset   COPD Mother    Hypertension Mother    Osteoarthritis Mother    CAD Father    Cancer Brother     Social  History Social History   Tobacco Use   Smoking status: Never   Smokeless tobacco: Never  Vaping Use   Vaping Use: Never used  Substance Use Topics   Alcohol use: No    Alcohol/week: 0.0 standard drinks of alcohol   Drug use: No     Allergies   Hydroxyzine; Codeine; Duloxetine hcl; Monosodium glutamate; Nitrates, organic; Other; Phosphorus; Sodium benzoate [nutritional supplements]; Sodium phosphates; and Zithromax [azithromycin]   Review of Systems Review of Systems  Psychiatric/Behavioral:         Nightmares     Physical Exam Triage Vital Signs ED Triage Vitals  Enc Vitals Group     BP 11/14/22 1120 131/87     Pulse Rate 11/14/22 1120 79     Resp 11/14/22 1120 14     Temp 11/14/22 1120 98.1 F (36.7 C)     Temp Source 11/14/22 1120 Oral     SpO2 11/14/22 1120 99 %     Weight 11/14/22 1118 119 lb 14.9 oz (54.4 kg)     Height 11/14/22 1118 5' (1.524 m)     Head Circumference --      Peak Flow --      Pain Score 11/14/22 1118 0     Pain Loc --      Pain Edu? --      Excl. in [place]? --    No data found.  Updated Vital Signs BP 131/87 (BP Location: Right Arm)   Pulse 79   Temp 98.1 F (36.7 C) (Oral)   Resp 14   Ht 5' (1.524 m)   Wt 119 lb 14.9 oz (54.4 kg)   SpO2 99%   BMI 23.42 kg/m   Visual Acuity Right Eye Distance:   Left Eye Distance:   Bilateral Distance:    Right Eye Near:   Left Eye Near:    Bilateral Near:     Physical Exam Vitals and nursing note reviewed.  Constitutional:      Appearance: Normal appearance.  HENT:     Head: Normocephalic and atraumatic.  Eyes:     Pupils: Pupils are equal, round, and reactive to light.  Cardiovascular:     Rate and Rhythm: Normal rate.  Pulmonary:     Effort: Pulmonary effort is normal.  Skin:    General: Skin is warm and dry.  Neurological:     General: No focal deficit present.     Mental Status: She is alert and oriented to person, place, and time.  Psychiatric:        Mood and Affect:  Mood normal.        Behavior: Behavior normal.      UC Treatments / Results  Labs (all labs ordered are listed, but only  abnormal results are displayed) Labs Reviewed - No data to display  EKG   Radiology No results found.  Procedures Procedures (including critical care time)  Medications Ordered in UC Medications - No data to display  Initial Impression / Assessment and Plan / UC Course  I have reviewed the triage vital signs and the nursing notes.  Pertinent labs & imaging results that were available during my care of the patient were reviewed by me and considered in my medical decision making (see chart for details).     Discussed with patient that nightmares as a side effect of the medication.  As she is on a low-dose and has only been on it for 5 days she can stopped the medication and she will follow-up tomorrow with her provider for further alternative treatment options.  Discussed with Dr. Windy Carina. Patient verbalized understanding this and is in agreement with plan. Final Clinical Impressions(s) / UC Diagnoses   Final diagnoses:  Side effect of medication     Discharge Instructions      Stop the Aricept and follow-up with your provider who prescribes this to discuss different treatment options   ED Prescriptions   None    PDMP not reviewed this encounter.   Melynda Ripple, NP 11/14/22 [phone]

## 2023-04-11 ENCOUNTER — Emergency Department: Payer: Medicare HMO

## 2023-04-11 ENCOUNTER — Other Ambulatory Visit: Payer: Self-pay

## 2023-04-11 ENCOUNTER — Inpatient Hospital Stay: Payer: Medicare HMO

## 2023-04-11 ENCOUNTER — Inpatient Hospital Stay
Admission: EM | Admit: 2023-04-11 | Discharge: 2023-05-06 | DRG: 871 | Disposition: A | Discharge status: Skilled Nursing Facility | Payer: Medicare HMO | Attending: Internal Medicine | Admitting: Internal Medicine

## 2023-04-11 DIAGNOSIS — J9621 Acute and chronic respiratory failure with hypoxia: Secondary | ICD-10-CM | POA: Diagnosis present

## 2023-04-11 DIAGNOSIS — J159 Unspecified bacterial pneumonia: Secondary | ICD-10-CM | POA: Diagnosis present

## 2023-04-11 DIAGNOSIS — A4189 Other specified sepsis: Secondary | ICD-10-CM | POA: Diagnosis present

## 2023-04-11 DIAGNOSIS — R7989 Other specified abnormal findings of blood chemistry: Secondary | ICD-10-CM | POA: Diagnosis present

## 2023-04-11 DIAGNOSIS — R4182 Altered mental status, unspecified: Secondary | ICD-10-CM | POA: Diagnosis present

## 2023-04-11 DIAGNOSIS — I214 Non-ST elevation (NSTEMI) myocardial infarction: Secondary | ICD-10-CM | POA: Diagnosis not present

## 2023-04-11 DIAGNOSIS — Z8616 Personal history of COVID-19: Secondary | ICD-10-CM | POA: Diagnosis not present

## 2023-04-11 DIAGNOSIS — A419 Sepsis, unspecified organism: Principal | ICD-10-CM | POA: Diagnosis present

## 2023-04-11 DIAGNOSIS — U071 COVID-19: Secondary | ICD-10-CM | POA: Diagnosis present

## 2023-04-11 DIAGNOSIS — Z881 Allergy status to other antibiotic agents status: Secondary | ICD-10-CM

## 2023-04-11 DIAGNOSIS — F0393 Unspecified dementia, unspecified severity, with mood disturbance: Secondary | ICD-10-CM | POA: Diagnosis present

## 2023-04-11 DIAGNOSIS — R4189 Other symptoms and signs involving cognitive functions and awareness: Secondary | ICD-10-CM | POA: Diagnosis present

## 2023-04-11 DIAGNOSIS — F0392 Unspecified dementia, unspecified severity, with psychotic disturbance: Secondary | ICD-10-CM | POA: Diagnosis not present

## 2023-04-11 DIAGNOSIS — I5181 Takotsubo syndrome: Secondary | ICD-10-CM | POA: Diagnosis not present

## 2023-04-11 DIAGNOSIS — Z66 Do not resuscitate: Secondary | ICD-10-CM | POA: Diagnosis not present

## 2023-04-11 DIAGNOSIS — Z87440 Personal history of urinary (tract) infections: Secondary | ICD-10-CM

## 2023-04-11 DIAGNOSIS — I509 Heart failure, unspecified: Secondary | ICD-10-CM | POA: Diagnosis not present

## 2023-04-11 DIAGNOSIS — J9601 Acute respiratory failure with hypoxia: Secondary | ICD-10-CM | POA: Diagnosis not present

## 2023-04-11 DIAGNOSIS — R338 Other retention of urine: Secondary | ICD-10-CM | POA: Diagnosis present

## 2023-04-11 DIAGNOSIS — Z7982 Long term (current) use of aspirin: Secondary | ICD-10-CM

## 2023-04-11 DIAGNOSIS — K219 Gastro-esophageal reflux disease without esophagitis: Secondary | ICD-10-CM | POA: Diagnosis present

## 2023-04-11 DIAGNOSIS — G9341 Metabolic encephalopathy: Secondary | ICD-10-CM | POA: Diagnosis present

## 2023-04-11 DIAGNOSIS — Z888 Allergy status to other drugs, medicaments and biological substances status: Secondary | ICD-10-CM

## 2023-04-11 DIAGNOSIS — J80 Acute respiratory distress syndrome: Secondary | ICD-10-CM | POA: Diagnosis present

## 2023-04-11 DIAGNOSIS — R079 Chest pain, unspecified: Secondary | ICD-10-CM | POA: Diagnosis not present

## 2023-04-11 DIAGNOSIS — Z8249 Family history of ischemic heart disease and other diseases of the circulatory system: Secondary | ICD-10-CM

## 2023-04-11 DIAGNOSIS — F03918 Unspecified dementia, unspecified severity, with other behavioral disturbance: Secondary | ICD-10-CM | POA: Diagnosis present

## 2023-04-11 DIAGNOSIS — I351 Nonrheumatic aortic (valve) insufficiency: Secondary | ICD-10-CM | POA: Diagnosis present

## 2023-04-11 DIAGNOSIS — L89156 Pressure-induced deep tissue damage of sacral region: Secondary | ICD-10-CM | POA: Diagnosis present

## 2023-04-11 DIAGNOSIS — Z79899 Other long term (current) drug therapy: Secondary | ICD-10-CM

## 2023-04-11 DIAGNOSIS — G4700 Insomnia, unspecified: Secondary | ICD-10-CM | POA: Diagnosis present

## 2023-04-11 DIAGNOSIS — Z8673 Personal history of transient ischemic attack (TIA), and cerebral infarction without residual deficits: Secondary | ICD-10-CM

## 2023-04-11 DIAGNOSIS — I400 Infective myocarditis: Secondary | ICD-10-CM | POA: Diagnosis not present

## 2023-04-11 DIAGNOSIS — R195 Other fecal abnormalities: Secondary | ICD-10-CM | POA: Diagnosis not present

## 2023-04-11 DIAGNOSIS — J690 Pneumonitis due to inhalation of food and vomit: Secondary | ICD-10-CM | POA: Diagnosis not present

## 2023-04-11 DIAGNOSIS — R441 Visual hallucinations: Secondary | ICD-10-CM | POA: Diagnosis not present

## 2023-04-11 DIAGNOSIS — I69398 Other sequelae of cerebral infarction: Secondary | ICD-10-CM

## 2023-04-11 DIAGNOSIS — R1314 Dysphagia, pharyngoesophageal phase: Secondary | ICD-10-CM | POA: Diagnosis present

## 2023-04-11 DIAGNOSIS — Z7901 Long term (current) use of anticoagulants: Secondary | ICD-10-CM

## 2023-04-11 DIAGNOSIS — I5030 Unspecified diastolic (congestive) heart failure: Secondary | ICD-10-CM | POA: Diagnosis not present

## 2023-04-11 DIAGNOSIS — Z638 Other specified problems related to primary support group: Secondary | ICD-10-CM

## 2023-04-11 DIAGNOSIS — J189 Pneumonia, unspecified organism: Secondary | ICD-10-CM | POA: Diagnosis present

## 2023-04-11 DIAGNOSIS — I2489 Other forms of acute ischemic heart disease: Secondary | ICD-10-CM | POA: Diagnosis not present

## 2023-04-11 DIAGNOSIS — E876 Hypokalemia: Secondary | ICD-10-CM | POA: Diagnosis present

## 2023-04-11 DIAGNOSIS — F0394 Unspecified dementia, unspecified severity, with anxiety: Secondary | ICD-10-CM | POA: Diagnosis present

## 2023-04-11 DIAGNOSIS — M6282 Rhabdomyolysis: Secondary | ICD-10-CM | POA: Diagnosis present

## 2023-04-11 DIAGNOSIS — R1310 Dysphagia, unspecified: Secondary | ICD-10-CM

## 2023-04-11 DIAGNOSIS — T45515A Adverse effect of anticoagulants, initial encounter: Secondary | ICD-10-CM | POA: Diagnosis not present

## 2023-04-11 DIAGNOSIS — R570 Cardiogenic shock: Secondary | ICD-10-CM | POA: Diagnosis not present

## 2023-04-11 DIAGNOSIS — R531 Weakness: Secondary | ICD-10-CM

## 2023-04-11 DIAGNOSIS — I82452 Acute embolism and thrombosis of left peroneal vein: Secondary | ICD-10-CM | POA: Diagnosis not present

## 2023-04-11 DIAGNOSIS — K5900 Constipation, unspecified: Secondary | ICD-10-CM | POA: Diagnosis not present

## 2023-04-11 DIAGNOSIS — F5105 Insomnia due to other mental disorder: Secondary | ICD-10-CM | POA: Diagnosis present

## 2023-04-11 DIAGNOSIS — N179 Acute kidney failure, unspecified: Secondary | ICD-10-CM | POA: Diagnosis present

## 2023-04-11 DIAGNOSIS — I429 Cardiomyopathy, unspecified: Secondary | ICD-10-CM | POA: Diagnosis not present

## 2023-04-11 DIAGNOSIS — J1282 Pneumonia due to coronavirus disease 2019: Secondary | ICD-10-CM | POA: Diagnosis present

## 2023-04-11 DIAGNOSIS — R6520 Severe sepsis without septic shock: Secondary | ICD-10-CM | POA: Diagnosis not present

## 2023-04-11 DIAGNOSIS — I5021 Acute systolic (congestive) heart failure: Secondary | ICD-10-CM | POA: Diagnosis present

## 2023-04-11 DIAGNOSIS — Z8619 Personal history of other infectious and parasitic diseases: Secondary | ICD-10-CM

## 2023-04-11 DIAGNOSIS — D649 Anemia, unspecified: Secondary | ICD-10-CM | POA: Diagnosis present

## 2023-04-11 DIAGNOSIS — Z8701 Personal history of pneumonia (recurrent): Secondary | ICD-10-CM

## 2023-04-11 DIAGNOSIS — N390 Urinary tract infection, site not specified: Secondary | ICD-10-CM | POA: Diagnosis present

## 2023-04-11 DIAGNOSIS — Z981 Arthrodesis status: Secondary | ICD-10-CM

## 2023-04-11 DIAGNOSIS — E785 Hyperlipidemia, unspecified: Secondary | ICD-10-CM | POA: Diagnosis present

## 2023-04-11 DIAGNOSIS — Z825 Family history of asthma and other chronic lower respiratory diseases: Secondary | ICD-10-CM

## 2023-04-11 DIAGNOSIS — I5023 Acute on chronic systolic (congestive) heart failure: Secondary | ICD-10-CM | POA: Diagnosis not present

## 2023-04-11 DIAGNOSIS — F05 Delirium due to known physiological condition: Secondary | ICD-10-CM | POA: Diagnosis not present

## 2023-04-11 DIAGNOSIS — E872 Acidosis, unspecified: Secondary | ICD-10-CM | POA: Diagnosis present

## 2023-04-11 DIAGNOSIS — Z885 Allergy status to narcotic agent status: Secondary | ICD-10-CM

## 2023-04-11 DIAGNOSIS — F418 Other specified anxiety disorders: Secondary | ICD-10-CM | POA: Diagnosis present

## 2023-04-11 DIAGNOSIS — R6521 Severe sepsis with septic shock: Secondary | ICD-10-CM | POA: Diagnosis not present

## 2023-04-11 DIAGNOSIS — F0390 Unspecified dementia without behavioral disturbance: Secondary | ICD-10-CM | POA: Diagnosis present

## 2023-04-11 DIAGNOSIS — I080 Rheumatic disorders of both mitral and aortic valves: Secondary | ICD-10-CM | POA: Diagnosis not present

## 2023-04-11 DIAGNOSIS — D6832 Hemorrhagic disorder due to extrinsic circulating anticoagulants: Secondary | ICD-10-CM | POA: Diagnosis not present

## 2023-04-11 DIAGNOSIS — R32 Unspecified urinary incontinence: Secondary | ICD-10-CM | POA: Diagnosis present

## 2023-04-11 HISTORY — DX: Anemia, unspecified: D64.9

## 2023-04-11 LAB — CBC WITH DIFFERENTIAL/PLATELET
Abs Immature Granulocytes: 0.04 10*3/uL (ref 0.00–0.07)
Basophils Absolute: 0 10*3/uL (ref 0.0–0.1)
Basophils Relative: 0 %
Eosinophils Absolute: 0 10*3/uL (ref 0.0–0.5)
Eosinophils Relative: 0 %
HCT: 23.6 % — ABNORMAL LOW (ref 36.0–46.0)
Hemoglobin: 7.8 g/dL — ABNORMAL LOW (ref 12.0–15.0)
Immature Granulocytes: 1 %
Lymphocytes Relative: 8 %
Lymphs Abs: 0.6 10*3/uL — ABNORMAL LOW (ref 0.7–4.0)
MCH: 31.5 pg (ref 26.0–34.0)
MCHC: 33.1 g/dL (ref 30.0–36.0)
MCV: 95.2 fL (ref 80.0–100.0)
Monocytes Absolute: 0.4 10*3/uL (ref 0.1–1.0)
Monocytes Relative: 5 %
Neutro Abs: 7 10*3/uL (ref 1.7–7.7)
Neutrophils Relative %: 86 %
Platelets: 101 10*3/uL — ABNORMAL LOW (ref 150–400)
RBC: 2.48 MIL/uL — ABNORMAL LOW (ref 3.87–5.11)
RDW: 13.9 % (ref 11.5–15.5)
Smear Review: NORMAL
WBC: 8.1 10*3/uL (ref 4.0–10.5)
nRBC: 0 % (ref 0.0–0.2)

## 2023-04-11 LAB — COMPREHENSIVE METABOLIC PANEL
ALT: 23 U/L (ref 0–44)
AST: 45 U/L — ABNORMAL HIGH (ref 15–41)
Albumin: 3.4 g/dL — ABNORMAL LOW (ref 3.5–5.0)
Alkaline Phosphatase: 63 U/L (ref 38–126)
Anion gap: 12 (ref 5–15)
BUN: 63 mg/dL — ABNORMAL HIGH (ref 8–23)
CO2: 21 mmol/L — ABNORMAL LOW (ref 22–32)
Calcium: 8.8 mg/dL — ABNORMAL LOW (ref 8.9–10.3)
Chloride: 105 mmol/L (ref 98–111)
Creatinine, Ser: 1.78 mg/dL — ABNORMAL HIGH (ref 0.44–1.00)
GFR, Estimated: 29 mL/min — ABNORMAL LOW (ref >60)
Glucose, Bld: 137 mg/dL — ABNORMAL HIGH (ref 70–99)
Potassium: 4.2 mmol/L (ref 3.5–5.1)
Sodium: 138 mmol/L (ref 135–145)
Total Bilirubin: 1.2 mg/dL (ref 0.3–1.2)
Total Protein: 7.6 g/dL (ref 6.5–8.1)

## 2023-04-11 LAB — PROCALCITONIN: Procalcitonin: 7.73 ng/mL

## 2023-04-11 LAB — LACTIC ACID, PLASMA
Lactic Acid, Venous: 2.4 mmol/L (ref 0.5–1.9)
Lactic Acid, Venous: 2 mmol/L (ref 0.5–1.9)

## 2023-04-11 LAB — CK: Total CK: 1438 U/L — ABNORMAL HIGH (ref 38–234)

## 2023-04-11 LAB — PROTIME-INR
INR: 1.2 (ref 0.8–1.2)
Prothrombin Time: 15.3 seconds — ABNORMAL HIGH (ref 11.4–15.2)

## 2023-04-11 LAB — SARS CORONAVIRUS 2 BY RT PCR: SARS Coronavirus 2 by RT PCR: POSITIVE — AB

## 2023-04-11 MED ORDER — SODIUM CHLORIDE 0.9 % IV SOLN
2.0000 g | INTRAVENOUS | Status: DC
  Administered 2023-04-11: 2 g via INTRAVENOUS
  Filled 2023-04-11: qty 20

## 2023-04-11 MED ORDER — LACTATED RINGERS IV SOLN
150.0000 mL/h | INTRAVENOUS | Status: AC
  Administered 2023-04-11 – 2023-04-12 (×2): 150 mL/h via INTRAVENOUS

## 2023-04-11 MED ORDER — HYDROCODONE-ACETAMINOPHEN 5-325 MG PO TABS
1.0000 | ORAL_TABLET | ORAL | Status: DC | PRN
  Administered 2023-04-17: 2 via ORAL
  Administered 2023-04-19 – 2023-04-21 (×2): 1 via ORAL
  Administered 2023-04-22: 2 via ORAL
  Filled 2023-04-11: qty 1
  Filled 2023-04-11 (×3): qty 2
  Filled 2023-04-11 (×2): qty 1
  Filled 2023-04-11: qty 2

## 2023-04-11 MED ORDER — DONEPEZIL HCL 5 MG PO TABS
10.0000 mg | ORAL_TABLET | Freq: Every day | ORAL | Status: DC
  Administered 2023-04-13 – 2023-04-20 (×6): 10 mg via ORAL
  Filled 2023-04-11 (×9): qty 2

## 2023-04-11 MED ORDER — SODIUM CHLORIDE 0.9 % IV SOLN
2.0000 g | INTRAVENOUS | Status: AC
  Administered 2023-04-12 – 2023-04-15 (×4): 2 g via INTRAVENOUS
  Filled 2023-04-11 (×4): qty 20

## 2023-04-11 MED ORDER — SODIUM CHLORIDE 0.9 % IV BOLUS (SEPSIS)
1000.0000 mL | Freq: Once | INTRAVENOUS | Status: AC
  Administered 2023-04-11: 1000 mL via INTRAVENOUS

## 2023-04-11 MED ORDER — SODIUM CHLORIDE 0.9 % IV SOLN
500.0000 mg | INTRAVENOUS | Status: AC
  Administered 2023-04-11 – 2023-04-15 (×5): 500 mg via INTRAVENOUS
  Filled 2023-04-11 (×5): qty 5

## 2023-04-11 MED ORDER — LACTATED RINGERS IV BOLUS
1000.0000 mL | Freq: Once | INTRAVENOUS | Status: AC
  Administered 2023-04-11: 1000 mL via INTRAVENOUS

## 2023-04-11 MED ORDER — ALBUTEROL SULFATE (2.5 MG/3ML) 0.083% IN NEBU: 2.5000 mg | INHALATION_SOLUTION | RESPIRATORY_TRACT | Status: DC | PRN

## 2023-04-11 MED ORDER — ACETAMINOPHEN 650 MG RE SUPP
650.0000 mg | Freq: Four times a day (QID) | RECTAL | Status: DC | PRN
  Administered 2023-04-15: 650 mg via RECTAL
  Filled 2023-04-11: qty 1

## 2023-04-11 MED ORDER — ATORVASTATIN CALCIUM 20 MG PO TABS
40.0000 mg | ORAL_TABLET | Freq: Every day | ORAL | Status: DC
  Administered 2023-04-11: 40 mg via ORAL
  Filled 2023-04-11: qty 2

## 2023-04-11 MED ORDER — ASPIRIN 81 MG PO CHEW
81.0000 mg | CHEWABLE_TABLET | Freq: Every day | ORAL | Status: DC
  Administered 2023-04-12 – 2023-04-27 (×12): 81 mg via ORAL
  Filled 2023-04-11 (×15): qty 1

## 2023-04-11 MED ORDER — ACETAMINOPHEN 325 MG PO TABS
650.0000 mg | ORAL_TABLET | Freq: Four times a day (QID) | ORAL | Status: DC | PRN
  Administered 2023-04-16 – 2023-04-18 (×2): 650 mg via ORAL
  Filled 2023-04-11 (×4): qty 2

## 2023-04-11 MED ORDER — ONDANSETRON HCL 4 MG/2ML IJ SOLN: 4.0000 mg | Freq: Four times a day (QID) | INTRAMUSCULAR | Status: DC | PRN

## 2023-04-11 MED ORDER — TRAZODONE HCL 50 MG PO TABS
25.0000 mg | ORAL_TABLET | Freq: Every evening | ORAL | Status: DC | PRN
  Administered 2023-04-16 – 2023-04-23 (×7): 25 mg via ORAL
  Filled 2023-04-11 (×7): qty 1

## 2023-04-11 MED ORDER — GUAIFENESIN 100 MG/5ML PO LIQD: 5.0000 mL | ORAL | Status: DC | PRN

## 2023-04-11 MED ORDER — ONDANSETRON HCL 4 MG PO TABS
4.0000 mg | ORAL_TABLET | Freq: Four times a day (QID) | ORAL | Status: DC | PRN
  Administered 2023-04-18: 4 mg via ORAL
  Filled 2023-04-11: qty 1

## 2023-04-11 NOTE — Assessment & Plan Note (Addendum)
Underlying Dementia vs [facility] Delirium Initially related to sepsis which is now resolved.  Ongoing confusion, hallucinations consistent with delirium, not unusual given prolonged admission complicated by ICU/ intubation. At baseline pt is oriented x 3 per family with only mild cognitive impairment.  CT head negative. --Mgmt of underlying issues as outlined --Supportive care --Delirium precautions --Resume trazodone at bedtime PRN --AVOID BENZO's due to paradoxical worsened agitation when given earlier in course of admission --Off Precedex
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
## 2023-04-11 NOTE — ED Notes (Signed)
Patient transported to CT 

## 2023-04-11 NOTE — ED Provider Notes (Signed)
[facility] Provider Note    Event Date/Time   First MD Initiated Contact with Patient 04/11/23 1646     (approximate)   History   Chief Complaint: Cough and Altered Mental Status   HPI  Teresa Sherman is a 77 y.o. female with a history of anxiety depression GERD and hyperlipidemia and stroke who is brought to the ED due to altered mental status.  Patient is normally alert and oriented and self-sufficient.  Family had not heard from the patient in 2 days so they called EMS for a wellness check and on their arrival, EMS noted the patient was confused and somnolent in her bed.  Incontinent of urine.  Patient is mumbling incoherently and unable to provide any meaningful history.     Physical Exam   Triage Vital Signs: ED Triage Vitals  Encounter Vitals Group     BP 04/11/23 1701 101/83     Systolic BP Percentile --      Diastolic BP Percentile --      Pulse Rate 04/11/23 1701 (!) 115     Resp 04/11/23 1701 (!) 23     Temp 04/11/23 1701 98.8 F (37.1 C)     Temp Source 04/11/23 1701 Oral     SpO2 04/11/23 1701 90 %     Weight 04/11/23 1702 136 lb 14.4 oz (62.1 kg)     Height 04/11/23 1702 5' (1.524 m)     Head Circumference --      Peak Flow --      Pain Score 04/11/23 1701 0     Pain Loc --      Pain Education --      Exclude from Growth Chart --     Most recent vital signs: Vitals:   04/11/23 1900 04/11/23 1915  BP: 126/87   Pulse: (!) 103 (!) 102  Resp: (!) 25 (!) 24  Temp:    SpO2: 93% 92%    General: Awake, ill-appearing CV:  Good peripheral perfusion.  Tachycardia, heart rate 115 Resp:  Tachypnea.  No accessory muscle use.  Diminished aeration in the right lower lung, otherwise normal breath sounds. Abd:  No distention.  Soft with suprapubic tenderness Other:  Dry oral mucosa.  Awake, not alert, oriented to self.   ED Results / Procedures / Treatments   Labs (all labs ordered are listed, but only abnormal results are  displayed) Labs Reviewed  SARS CORONAVIRUS 2 BY RT PCR - Abnormal; Notable for the following components:      Result Value   SARS Coronavirus 2 by RT PCR POSITIVE (*)    All other components within normal limits  COMPREHENSIVE METABOLIC PANEL - Abnormal; Notable for the following components:   CO2 21 (*)    Glucose, Bld 137 (*)    BUN 63 (*)    Creatinine, Ser 1.78 (*)    Calcium 8.8 (*)    Albumin 3.4 (*)    AST 45 (*)    GFR, Estimated 29 (*)    All other components within normal limits  LACTIC ACID, PLASMA - Abnormal; Notable for the following components:   Lactic Acid, Venous 2.4 (*)    All other components within normal limits  CBC WITH DIFFERENTIAL/PLATELET - Abnormal; Notable for the following components:   RBC 2.48 (*)    Hemoglobin 7.8 (*)    HCT 23.6 (*)    Platelets 101 (*)    Lymphs Abs 0.6 (*)    All  other components within normal limits  PROTIME-INR - Abnormal; Notable for the following components:   Prothrombin Time 15.3 (*)    All other components within normal limits  CK - Abnormal; Notable for the following components:   Total CK 1,438 (*)    All other components within normal limits  CULTURE, BLOOD (ROUTINE X 2)  CULTURE, BLOOD (ROUTINE X 2)  PROCALCITONIN  LACTIC ACID, PLASMA  URINALYSIS, W/ REFLEX TO CULTURE (INFECTION SUSPECTED)     EKG Interpreted by me Sinus tachycardia rate 111.  Normal axis, normal intervals.  Normal QRS ST segments and T waves   RADIOLOGY Chest x-ray interpreted by me, appears normal.  Radiology report reviewed   PROCEDURES:  .Critical Care  Performed by: Sharman Cheek, MD Authorized by: Sharman Cheek, MD   Critical care provider statement:    Critical care time (minutes):  35   Critical care time was exclusive of:  Separately billable procedures and treating other patients   Critical care was necessary to treat or prevent imminent or life-threatening deterioration of the following conditions:  Sepsis,  respiratory failure and CNS failure or compromise   Critical care was time spent personally by me on the following activities:  Development of treatment plan with patient or surrogate, discussions with consultants, evaluation of patient's response to treatment, examination of patient, obtaining history from patient or surrogate, ordering and performing treatments and interventions, ordering and review of laboratory studies, ordering and review of radiographic studies, pulse oximetry, re-evaluation of patient's condition and review of old charts   Care discussed with: admitting provider      MEDICATIONS ORDERED IN ED: Medications  cefTRIAXone (ROCEPHIN) 2 g in sodium chloride 0.9 % 100 mL IVPB (0 g Intravenous Stopped 04/11/23 1853)  sodium chloride 0.9 % bolus 1,000 mL (0 mLs Intravenous Stopped 04/11/23 1853)  lactated ringers bolus 1,000 mL (1,000 mLs Intravenous New Bag/Given 04/11/23 1901)     IMPRESSION / MDM / ASSESSMENT AND PLAN / ED COURSE  I reviewed the triage vital signs and the nursing notes.  DDx: COVID, pneumonia, UTI, diverticulitis, sepsis, AKI, electrolyte abnormality, rhabdomyolysis, dehydration  Patient's presentation is most consistent with acute presentation with potential threat to life or bodily function.  Patient presents with altered mental status, tachycardia, tachypnea, appears septic.  Will obtain labs, chest x-ray, give IV fluids and ceftriaxone.   ----------------------------------------- 6:37 PM on 04/11/2023 ----------------------------------------- COVID-positive.  Also has elevated CK and AKI consistent with rhabdomyolysis.  Pro-Cal is significantly elevated, will follow-up UA, continue IV fluids.  ----------------------------------------- 8:08 PM on 04/11/2023 ----------------------------------------- Case discussed with hospitalist      FINAL CLINICAL IMPRESSION(S) / ED DIAGNOSES   Final diagnoses:  Sepsis with acute hypoxic respiratory failure  without septic shock, due to unspecified organism (HCC)  COVID-19 virus infection  Non-traumatic rhabdomyolysis     Rx / DC Orders   ED Discharge Orders     None        Note:  This document was prepared using Dragon voice recognition software and may include unintentional dictation errors.   Sharman Cheek, MD 04/11/23 2008

## 2023-04-11 NOTE — Assessment & Plan Note (Addendum)
Resolved.  Creatinine 1.78 on admission.  up from baseline of 0.51, likely prerenal and related to sepsis. Renal function normalized. --Monitor BMP's

## 2023-04-11 NOTE — Assessment & Plan Note (Addendum)
CK near 1500 on admission, unknown whether traumatic or not.  Patient was found on the bed. Resolved with IV hydration.

## 2023-04-11 NOTE — Assessment & Plan Note (Addendum)
Holding ASA and statin due to anemia requiring transfusion, resume when appropriate.

## 2023-04-11 NOTE — Assessment & Plan Note (Addendum)
Uncertain acuity.  Hemoglobin 7.8 on admission, down from 13 a year ago. 9/7 - ICU RN reports pt has been having dark stools.  Anticoagulation was held and she was given 2 units pRBC's on 9/4 and 9/5.   --Monitor for bleeding signs --Daily CBC's, more frequent H&H as needed. Hbg stable at 8.4 >> 8.9 --Resume anticoagulation if further improvement and no bleeding

## 2023-04-11 NOTE — Assessment & Plan Note (Addendum)
Insomnia Resume home trazodone at bedtime PRN

## 2023-04-11 NOTE — ED Notes (Signed)
Requested nurse handoff completion at this time for ongoing nurse

## 2023-04-11 NOTE — Assessment & Plan Note (Addendum)
POA, resolved. Severe sepsis criteria include tachycardia and tachypnea with soft blood pressure, AKI, AMS, lactic acidosis with elevated procalcitonin Due to COVID pneumonia with secondary bacterial infection, UTI --Completed antibiotics

## 2023-04-11 NOTE — Assessment & Plan Note (Addendum)
Right lower lobe pneumonia Completed Rocephin and azithromycin --Off COVID precautions --Pulmonary hygiene --O2 per protocol --Symptomatic care as needed --Remains on IV steroids 10 mg through 9/11

## 2023-04-11 NOTE — Assessment & Plan Note (Addendum)
History of Pseudomonas and ESBL E. coli UTI 11/2021 Urine reportedly foul-smelling with EMS.  Urine culture insignificant growth. Completed antibiotics for pneumonia as outlined.

## 2023-04-11 NOTE — ED Triage Notes (Signed)
Pt arrives from home via [facility], family originally called for wellness check because they hadnt heard from her in 2 days. On EMS arrival pt was "unresponsive". Pt normally oriented x4 per EMS, oriented to self and time today. EMS reports possible UTI. Pt does have congested cough, urine is foul smelling. Thick white film noted to inside of mouth.   Given 500LR 100 ST 30 RR 130/80

## 2023-04-12 DIAGNOSIS — A419 Sepsis, unspecified organism: Secondary | ICD-10-CM | POA: Diagnosis not present

## 2023-04-12 DIAGNOSIS — R6520 Severe sepsis without septic shock: Secondary | ICD-10-CM | POA: Diagnosis not present

## 2023-04-12 LAB — COMPREHENSIVE METABOLIC PANEL
ALT: 21 U/L (ref 0–44)
AST: 50 U/L — ABNORMAL HIGH (ref 15–41)
Albumin: 2.9 g/dL — ABNORMAL LOW (ref 3.5–5.0)
Alkaline Phosphatase: 52 U/L (ref 38–126)
Anion gap: 10 (ref 5–15)
BUN: 48 mg/dL — ABNORMAL HIGH (ref 8–23)
CO2: 24 mmol/L (ref 22–32)
Calcium: 8.3 mg/dL — ABNORMAL LOW (ref 8.9–10.3)
Chloride: 107 mmol/L (ref 98–111)
Creatinine, Ser: 1.15 mg/dL — ABNORMAL HIGH (ref 0.44–1.00)
GFR, Estimated: 50 mL/min — ABNORMAL LOW (ref >60)
Glucose, Bld: 122 mg/dL — ABNORMAL HIGH (ref 70–99)
Potassium: 3.7 mmol/L (ref 3.5–5.1)
Sodium: 141 mmol/L (ref 135–145)
Total Bilirubin: 0.9 mg/dL (ref 0.3–1.2)
Total Protein: 6.6 g/dL (ref 6.5–8.1)

## 2023-04-12 LAB — URINALYSIS, W/ REFLEX TO CULTURE (INFECTION SUSPECTED)
Bilirubin Urine: NEGATIVE
Glucose, UA: NEGATIVE mg/dL
Ketones, ur: 5 mg/dL — AB
Leukocytes,Ua: NEGATIVE
Nitrite: NEGATIVE
Protein, ur: 100 mg/dL — AB
Specific Gravity, Urine: 1.024 (ref 1.005–1.030)
pH: 5 (ref 5.0–8.0)

## 2023-04-12 LAB — BLOOD CULTURE ID PANEL (REFLEXED) - BCID2

## 2023-04-12 LAB — CORTISOL-AM, BLOOD: Cortisol - AM: 31.4 ug/dL — ABNORMAL HIGH (ref 6.7–22.6)

## 2023-04-12 LAB — HEMOGLOBIN: Hemoglobin: 13.6 g/dL (ref 12.0–15.0)

## 2023-04-12 LAB — PROCALCITONIN: Procalcitonin: 4.12 ng/mL

## 2023-04-12 MED ORDER — SACCHAROMYCES BOULARDII 250 MG PO CAPS
250.0000 mg | ORAL_CAPSULE | Freq: Two times a day (BID) | ORAL | Status: AC
  Administered 2023-04-12 – 2023-04-16 (×5): 250 mg via ORAL
  Filled 2023-04-12 (×10): qty 1

## 2023-04-12 MED ORDER — SACCHAROMYCES BOULARDII 250 MG PO CAPS: 250.0000 mg | ORAL_CAPSULE | Freq: Two times a day (BID) | ORAL | Status: DC

## 2023-04-12 MED ORDER — ENOXAPARIN SODIUM 40 MG/0.4ML IJ SOSY
40.0000 mg | PREFILLED_SYRINGE | Freq: Every evening | INTRAMUSCULAR | Status: DC
  Administered 2023-04-12 – 2023-04-15 (×4): 40 mg via SUBCUTANEOUS
  Filled 2023-04-12 (×4): qty 0.4

## 2023-04-12 MED ORDER — SODIUM CHLORIDE 0.9 % IV SOLN: INTRAVENOUS | Status: DC

## 2023-04-12 MED ORDER — GUAIFENESIN 100 MG/5ML PO LIQD
15.0000 mL | Freq: Three times a day (TID) | ORAL | Status: DC
  Administered 2023-04-13 – 2023-04-28 (×32): 15 mL via ORAL
  Filled 2023-04-12 (×39): qty 20
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
## 2023-04-12 NOTE — Progress Notes (Addendum)
[facility] Progress Note  Patient: Teresa Sherman    CWC:[id number]  DOA: 04/11/2023     Date of Service: the patient was seen and examined on 04/12/2023  Chief Complaint  Patient presents with   Cough   Altered Mental Status   Brief hospital course: Teresa Sherman is a 77 y.o. female with medical history significant for Prior stroke, depression with anxiety, prior ESBL E. coli, who presents to the emergency room by EMS with altered mental status.  She was found after wellness check was requested after she had not been seen in 2 days.  She was found somnolent and confused in her bed and had unintelligible mumbling but without focal neurologic deficits.  She is reportedly oriented x 3 at baseline.  She was also noted by EMS to have foul-smelling urine ED course and data review: Normotensive tachycardic to 115, tachypneic to 23 with O2 sat 90 to 95% on room air, BP 101/83 improving to 126/87 with IV fluids Labs with normal WBC of 8.1 but lactic acid 2.4 and procalcitonin 7.73.  Hemoglobin noted to be 7.8 with baseline around 13 a year ago.  CMP notable for creatinine of 1.78, up from 0.51 a year ago, AST/ALT 45/23.  COVID-positive.  CK14 38.G, EKG personally viewed and interpreted showing sinus tachycardia at 111 with no acute ST-T wave changes. Chest x-ray with possible right lower lobe airspace opacity as further detailed below: IMPRESSION: Right lower lung zone airspace opacity. Followup PA and lateral chest X-ray is recommended in 3-4 weeks following trial of antibiotic therapy to ensure resolution and exclude underlying malignancy.   Patient started on sepsis fluids and given Rocephin for suspected UTI Hospitalist consulted for admission.    Assessment and Plan:  Severe sepsis  Severe sepsis criteria include tachycardia and tachypnea with soft blood pressure, AKI, AMS, lactic acidosis with elevated procalcitonin Etiology either COVID pneumonia with secondary  bacterial infection, UTI Continue sepsis fluids and sepsis antibiotics  Blood culture 1/4 growing staph species, follow complete report and repeat blood cultures if needed.  Pneumonia due to COVID-19 virus Right lower lobe pneumonia Suspect secondarily infected COVID pneumonia.  Patient is not hypoxic Chest x-ray showing right lower lobe airspace disease recommending follow-up imaging to rule out underlying process Continue Rocephin and azithromycin Started Robitussin 15 mL p.o. 3 times daily scheduled Albuterol as needed, DuoNebs, incentive spirometer, antitussives COVID precautions      Urinary tract infection History of Pseudomonas and ESBL E. coli UTI 11/2021 Urine reportedly foul-smelling with EMS.   UA not very impressive, WBC 10-20, bacteria rare.  LE and nitrite negative Follow-up urine culture Continue Rocephin for now Urinary retention, bladder scan showed 800 mL, Foley catheter inserted on 8/20  AKI (acute kidney injury) (HCC) Creatinine 1.78 up from baseline of 0.51, likely prerenal and related to sepsis avoid nephrotoxins Cr 1.15, gradually improving after IV fluid Continue to monitor urine output and renal functions daily    Rhabdomyolysis CK near 1500, unknown whether traumatic or not.  Patient was found on the bed IV hydration Continue IV fluid and trend CK level   Acute metabolic encephalopathy Patient with oriented x 3 at baseline found to be confused, with last known well 2 days prior Likely related to sepsis CT head negative for any acute findings Continue neurologic checks Continue fall and aspiration precautions   Anemia Anemia of uncertain acuity.  Hemoglobin 7.8, down from 13 a year ago Serial H&H and transfuse if necessary Can consider GI consult  inpatient versus outpatient to evaluate for endoscopy.  Last colonoscopy 2016 8/20 repeat Hb 13.6, could be lab error.  Recheck CBC tomorrow a.m.   History of stroke Continue antiplatelets and statin     Depression with anxiety On trazodone nightly.  Will hold given altered mental status  Body mass index is 26.74 kg/m.  Interventions:  Pressure Injury 04/12/23 Coccyx Right;Medial Deep Tissue Pressure Injury - Purple or maroon localized area of discolored intact skin or blood-filled blister due to damage of underlying soft tissue from pressure and/or shear. (Active)  04/12/23 0130  Location: Coccyx  Location Orientation: Right;Medial  Staging: Deep Tissue Pressure Injury - Purple or maroon localized area of discolored intact skin or blood-filled blister due to damage of underlying soft tissue from pressure and/or shear.  Wound Description (Comments):   Present on Admission: Yes  Dressing Type Foam - Lift dressing to assess site every shift 04/12/23 0032     Diet: NPO due to AMS DVT Prophylaxis: Subcutaneous Lovenox   Advance goals of care discussion: Full code  Family Communication: family was not present at bedside, at the time of interview.  The pt could not provide permission to discuss medical plan with the family due to AMS.   Disposition:  Pt is from Home, admitted with sepsis, covid and UTI, still has AMS, which precludes a safe discharge. Discharge to Home, when when stable. May need few days to improve.  Subjective: No significant events overnight, patient remained obtunded, AO x 1, unable to offer any complaints.  Completely disoriented and sleepy.  Patient was dozing off during my interview, denied any specific complaints.  Physical Exam: General: NAD, lying comfortably Appear in no distress, affect appropriate Eyes: PERRLA ENT: Oral Mucosa Clear, moist  Neck: no JVD,  Cardiovascular: S1 and S2 Present, no Murmur,  Respiratory: Equal air entry bilaterally, bilateral crackles, no wheezing appreciated Abdomen: Bowel Sound present, Soft and no tenderness,  Skin: no rashes Extremities: no Pedal edema, no calf tenderness Neurologic: without any new focal  findings Gait not checked due to patient safety concerns  Vitals:   04/11/23 2230 04/11/23 2245 04/12/23 0032 04/12/23 0739  BP: (!) 135/93  130/81 (!) 151/95  Pulse: (!) 105  95 94  Resp: (!) 23 17 20 17   Temp:   (!) 97.5 F (36.4 C) 98.3 F (36.8 C)  TempSrc:   Axillary   SpO2: 97%  95% 98%  Weight:      Height:        Intake/Output Summary (Last 24 hours) at 04/12/2023 1419 Last data filed at 04/12/2023 0700 Gross per 24 hour  Intake 1601.37 ml  Output --  Net 1601.37 ml   Filed Weights   04/11/23 1702  Weight: 62.1 kg    Data Reviewed: I have personally reviewed and interpreted daily labs, tele strips, imagings as discussed above. I reviewed all nursing notes, pharmacy notes, vitals, pertinent old records I have discussed plan of care as described above with RN and patient/family.  CBC: Recent Labs  Lab 04/11/23 1704 04/12/23 0104  WBC 8.1  --   NEUTROABS 7.0  --   HGB 7.8* 13.6  HCT 23.6*  --   MCV 95.2  --   PLT 101*  --    Basic Metabolic Panel: Recent Labs  Lab 04/11/23 1704 04/12/23 0104  NA 138 141  K 4.2 3.7  CL 105 107  CO2 21* 24  GLUCOSE 137* 122*  BUN 63* 48*  CREATININE 1.78* 1.15*  CALCIUM 8.8* 8.3*    Studies: CT HEAD WO CONTRAST ( )  Result Date: 04/11/2023 CLINICAL DATA:  Mental status change, unknown cause EXAM: CT HEAD WITHOUT CONTRAST TECHNIQUE: Contiguous axial images were obtained from the base of the skull through the vertex without intravenous contrast. RADIATION DOSE REDUCTION: This exam was performed according to the departmental dose-optimization program which includes automated exposure control, adjustment of the mA and/or kV according to patient size and/or use of iterative reconstruction technique. COMPARISON:  MRI head 11/30/2021 FINDINGS: Brain: No evidence of large-territorial acute infarction. No parenchymal hemorrhage. No mass lesion. No extra-axial collection. No mass effect or midline shift. No hydrocephalus.  Basilar cisterns are patent. Vascular: No hyperdense vessel. Skull: No acute fracture or focal lesion. Sinuses/Orbits: Paranasal sinuses and mastoid air cells are clear. The orbits are unremarkable. Other: None. IMPRESSION: No acute intracranial abnormality. Electronically Signed   By: Tish Frederickson M.D.   On: 04/11/2023 21:31   DG Chest 1 View  Result Date: 04/11/2023 CLINICAL DATA:  [id number] Sepsis (HCC) [id number] l. Pt normally oriented x4 per EMS, oriented to self and time today. EMS reports possible UTI. Pt does have congested cough, urine is foul smelling. EXAM: CHEST  1 VIEW COMPARISON:  Chest x-ray 12/05/2021 FINDINGS: The heart and mediastinal contours are within normal limits. Right lower lung zone airspace opacity. No pulmonary edema. No pleural effusion. No pneumothorax. No acute osseous abnormality. IMPRESSION: Right lower lung zone airspace opacity. Followup PA and lateral chest X-ray is recommended in 3-4 weeks following trial of antibiotic therapy to ensure resolution and exclude underlying malignancy. Electronically Signed   By: Tish Frederickson M.D.   On: 04/11/2023 18:39    Scheduled Meds:  aspirin  81 mg Oral Daily   donepezil  10 mg Oral QHS   guaiFENesin  15 mL Oral TID   saccharomyces boulardii  250 mg Oral BID   Continuous Infusions:  sodium chloride     azithromycin Stopped (04/11/23 2207)   cefTRIAXone (ROCEPHIN)  IV     lactated ringers 150 mL/hr (04/12/23 1131)   PRN Meds: acetaminophen **OR** acetaminophen, albuterol, HYDROcodone-acetaminophen, ondansetron **OR** ondansetron (ZOFRAN) IV, traZODone  Time spent: 35 minutes  Author: Gillis Santa. MD [facility] 04/12/2023 2:19 PM  To reach On-call, see care teams to locate the attending and reach out to them via [email or web address]. If 7PM-7AM, please contact night-coverage If you still have difficulty reaching the attending provider, please page the [facility] (Director on Call) for [facility] on amion for  assistance.

## 2023-04-12 NOTE — Evaluation (Signed)
Physical Therapy Evaluation Patient Details Name: Teresa Sherman MRN: [id number] DOB: 22-Aug-1946 Today's Date: 04/12/2023  History of Present Illness  Teresa Sherman is a 77 y.o. female with medical history significant for Prior stroke, depression with anxiety, prior ESBL E. coli, who presents to the emergency room by EMS with altered mental status.  She was found after wellness check was requested after she had not been seen in 2 days. Admitted to hospital wiith sepsis and COVID + with concerns for UTI.  Clinical Impression  Patient admitted with the above. PTA, patient lives alone, however patient is poor historian so unable to obtain full home setup and functional level. O2 off on arrival with spO2 95% on RA. Patient presents with weakness, impaired balance, decreased activity tolerance, and impaired cognition. Patient required min-modA for bed mobility and minA+2 for sit to stand and stand pivot transfer. Incontinent of bowel and bladder in standing requiring transfer to [facility]. Returned to bed at end of session with RN present. Patient will benefit from skilled PT services during acute stay to address listed deficits. Patient will benefit from ongoing therapy at discharge to maximize functional independence and safety.         If plan is discharge home, recommend the following: A lot of help with walking and/or transfers;A lot of help with bathing/dressing/bathroom;Assistance with cooking/housework;Direct supervision/assist for medications management;Direct supervision/assist for financial management;Assist for transportation;Help with stairs or ramp for entrance;Supervision due to cognitive status   Can travel by private vehicle   No    Equipment Recommendations Other (comment) (TBD at next venue of care)  Recommendations for Other Services       Functional Status Assessment Patient has had a recent decline in their functional status and demonstrates the ability to make  significant improvements in function in a reasonable and predictable amount of time.     Precautions / Restrictions Precautions Precautions: Fall Restrictions Weight Bearing Restrictions: No      Mobility  Bed Mobility Overal bed mobility: Needs Assistance Bed Mobility: Rolling, Supine to Sit, Sit to Supine Rolling: Min assist   Supine to sit: Mod assist Sit to supine: Mod assist        Transfers Overall transfer level: Needs assistance Equipment used: 2 person hand held assist Transfers: Sit to/from Stand, Bed to chair/wheelchair/BSC Sit to Stand: Min assist, +2 physical assistance, +2 safety/equipment   Step pivot transfers: Min assist, +2 physical assistance, +2 safety/equipment       General transfer comment: incontinent in standing    Ambulation/Gait                  Stairs            Wheelchair Mobility     Tilt Bed    Modified Rankin (Stroke Patients Only)       Balance Overall balance assessment: Needs assistance Sitting-balance support: No upper extremity supported, Feet supported Sitting balance-Leahy Scale: Fair     Standing balance support: Bilateral upper extremity supported Standing balance-Leahy Scale: Poor                               Pertinent Vitals/Pain Pain Assessment Pain Assessment: Faces Faces Pain Scale: Hurts a little bit Pain Location: L hip Pain Descriptors / Indicators: Discomfort    Home Living Family/patient expects to be discharged to:: Private residence Living Arrangements: Alone   Type of Home: House Home Access: Stairs to enter  Entrance Stairs-Number of Steps: 1       Additional Comments: pt unable to asnwer, home setup per chart    Prior Function Prior Level of Function : Patient poor historian/Family not available                     Extremity/Trunk Assessment   Upper Extremity Assessment Upper Extremity Assessment: Generalized weakness    Lower Extremity  Assessment Lower Extremity Assessment: Generalized weakness       Communication   Communication Communication: Difficulty following commands/understanding  Cognition Arousal: Alert Behavior During Therapy: Impulsive Overall Cognitive Status: Impaired/Different from baseline Area of Impairment: Orientation, Following commands, Safety/judgement, Awareness                 Orientation Level: Disoriented to, Place, Time, Situation     Following Commands: Follows one step commands with increased time Safety/Judgement: Decreased awareness of safety, Decreased awareness of deficits Awareness: Emergent   General Comments: Alternately states she is at home then has been at the hospital for 1 weeks. Aware of her incontinence episodes as they were happening.        General Comments General comments (skin integrity, edema, etc.): O2 removed upon arrival, SpO2 95% on RA    Exercises     Assessment/Plan    PT Assessment Patient needs continued PT services  PT Problem List Decreased strength;Decreased activity tolerance;Decreased balance;Decreased mobility;Decreased cognition;Decreased knowledge of use of DME;Decreased safety awareness;Decreased knowledge of precautions;Cardiopulmonary status limiting activity       PT Treatment Interventions DME instruction;Gait training;Functional mobility training;Therapeutic exercise;Therapeutic activities;Balance training;Patient/family education    PT Goals (Current goals can be found in the Care Plan section)  Acute Rehab PT Goals Patient Stated Goal: did not state PT Goal Formulation: Patient unable to participate in goal setting Time For Goal Achievement: 04/26/23 Potential to Achieve Goals: Fair    Frequency Min 1X/week     Co-evaluation PT/OT/SLP Co-Evaluation/Treatment: Yes Reason for Co-Treatment: Necessary to address cognition/behavior during functional activity PT goals addressed during session: Mobility/safety with  mobility OT goals addressed during session: ADL's and self-care       AM-PAC PT "6 Clicks" Mobility  Outcome Measure Help needed turning from your back to your side while in a flat bed without using bedrails?: A Lot Help needed moving from lying on your back to sitting on the side of a flat bed without using bedrails?: A Lot Help needed moving to and from a bed to a chair (including a wheelchair)?: Total Help needed standing up from a chair using your arms (e.g., wheelchair or bedside chair)?: Total Help needed to walk in hospital room?: Total Help needed climbing 3-5 steps with a railing? : Total 6 Click Score: 8    End of Session   Activity Tolerance: Patient tolerated treatment well Patient left: in bed;with call bell/phone within reach;with bed alarm set Nurse Communication: Mobility status PT Visit Diagnosis: Unsteadiness on feet (R26.81);Muscle weakness (generalized) (M62.81);Other abnormalities of gait and mobility (R26.89);Difficulty in walking, not elsewhere classified (R26.2)    Time: 0865-7846 PT Time Calculation (min) (ACUTE ONLY): 23 min   Charges:   PT Evaluation $PT Eval Moderate Complexity: 1 Mod   PT General Charges $$ ACUTE PT VISIT: 1 Visit         Maylon Peppers, PT, DPT Physical Therapist - [place]  [facility]   Roston Grunewald A Micca Matura 04/12/2023, 10:20 AM

## 2023-04-12 NOTE — Evaluation (Signed)
Occupational Therapy Evaluation Patient Details Name: Teresa Sherman MRN: [id number] DOB: [birth year] Today's Date: 04/12/2023   History of Present Illness Teresa Sherman is a 77 y.o. female with medical history significant for Prior stroke, depression with anxiety, prior ESBL E. coli, who presents to the emergency room by EMS with altered mental status.  She was found after wellness check was requested after she had not been seen in 2 days. Admitted to hospital wiith sepsis and COVID + with concerns for UTI.   Clinical Impression   Ms Cannistraci was seen for OT/PT evaluation this date. Prior to hospital admission, pt was at home alone, pt poor historian and unable to provide full setup. O2 removed upon arrival, SpO2 95% on RA. Pt alternately states she is at home then has been at the hospital for 2 weeks. Multiple incontinence episodes t/o session, pt aware as they were happening. Pt currently requires MIN A x2 + HHA for BSC t/f. MAX A pericare at bed level and standing at [facility]. Left with RN in room. Pt would benefit from skilled OT to address noted impairments and functional limitations (see below for any additional details). Upon hospital discharge, recommend OT follow up.    If plan is discharge home, recommend the following: A lot of help with walking and/or transfers;A lot of help with bathing/dressing/bathroom;Supervision due to cognitive status;Help with stairs or ramp for entrance    Functional Status Assessment  Patient has had a recent decline in their functional status and demonstrates the ability to make significant improvements in function in a reasonable and predictable amount of time.  Equipment Recommendations  BSC/3in1    Recommendations for Other Services       Precautions / Restrictions Precautions Precautions: Fall Restrictions Weight Bearing Restrictions: No      Mobility Bed Mobility Overal bed mobility: Needs Assistance Bed Mobility: Rolling, Supine to  Sit, Sit to Supine Rolling: Min assist   Supine to sit: Mod assist Sit to supine: Mod assist        Transfers Overall transfer level: Needs assistance Equipment used: 2 person hand held assist Transfers: Sit to/from Stand, Bed to chair/wheelchair/BSC Sit to Stand: Min assist, +2 physical assistance, +2 safety/equipment     Step pivot transfers: Min assist, +2 physical assistance, +2 safety/equipment     General transfer comment: incontinent in standing      Balance Overall balance assessment: Needs assistance Sitting-balance support: No upper extremity supported, Feet supported Sitting balance-Leahy Scale: Fair     Standing balance support: Bilateral upper extremity supported Standing balance-Leahy Scale: Poor                             ADL either performed or assessed with clinical judgement   ADL Overall ADL's : Needs assistance/impaired                                       General ADL Comments: MIN A x2 + HHA for BSC t/f. MAX A pericare at bed level and standing at [facility]. MAX A don B socks seated EOB.       Pertinent Vitals/Pain Pain Assessment Pain Assessment: Faces Faces Pain Scale: Hurts a little bit Pain Location: L hip Pain Descriptors / Indicators: Discomfort Pain Intervention(s): Limited activity within patient's tolerance, Repositioned     Extremity/Trunk Assessment Upper Extremity Assessment Upper Extremity Assessment:  Generalized weakness   Lower Extremity Assessment Lower Extremity Assessment: Generalized weakness       Communication Communication Communication: Difficulty following commands/understanding   Cognition Arousal: Alert Behavior During Therapy: Impulsive Overall Cognitive Status: Impaired/Different from baseline Area of Impairment: Orientation, Following commands, Safety/judgement, Awareness                 Orientation Level: Disoriented to, Place, Time, Situation     Following  Commands: Follows one step commands with increased time Safety/Judgement: Decreased awareness of safety, Decreased awareness of deficits Awareness: Emergent   General Comments: Alternately states she is at home then has been at the hospital for 2 weeks. Aware of her incontinence episodes as they were happening.     General Comments  O2 removed upon arrival, SpO2 95% on RA            Home Living Family/patient expects to be discharged to:: Private residence Living Arrangements: Alone   Type of Home: House Home Access: Stairs to enter [employer] of Steps: 1                       Additional Comments: pt unable to asnwer, home setup per chart      Prior Functioning/Environment Prior Level of Function : Patient poor historian/Family not available                        OT Problem List: Decreased strength;Decreased range of motion;Decreased activity tolerance;Impaired balance (sitting and/or standing);Decreased safety awareness      OT Treatment/Interventions: Self-care/ADL training;Therapeutic exercise;Energy conservation;DME and/or AE instruction;Therapeutic activities;Balance training;Patient/family education    OT Goals(Current goals can be found in the care plan section) Acute Rehab OT Goals Patient Stated Goal: to get warm OT Goal Formulation: With patient Time For Goal Achievement: 04/26/23 Potential to Achieve Goals: Fair ADL Goals Pt Will Perform Grooming: with modified independence;standing Pt Will Perform Lower Body Dressing: with modified independence;sit to/from stand Pt Will Transfer to Toilet: with modified independence;ambulating;regular height toilet  OT Frequency: Min 1X/week    Co-evaluation PT/OT/SLP Co-Evaluation/Treatment: Yes Reason for Co-Treatment: Necessary to address cognition/behavior during functional activity PT goals addressed during session: Mobility/safety with mobility OT goals addressed during session: ADL's  and self-care      AM-PAC OT "6 Clicks" Daily Activity     Outcome Measure Help from another person eating meals?: A Little Help from another person taking care of personal grooming?: A Little Help from another person toileting, which includes using toliet, bedpan, or urinal?: A Lot Help from another person bathing (including washing, rinsing, drying)?: A Lot Help from another person to put on and taking off regular upper body clothing?: A Little Help from another person to put on and taking off regular lower body clothing?: A Lot 6 Click Score: 15   End of Session Nurse Communication: Mobility status  Activity Tolerance: Patient tolerated treatment well Patient left: in bed;with bed alarm set;with call bell/phone within reach;with nursing/sitter in room  OT Visit Diagnosis: Unsteadiness on feet (R26.81);Muscle weakness (generalized) (M62.81)                Time: 5284-1324 OT Time Calculation (min): 22 min Charges:  OT General Charges $OT Visit: 1 Visit OT Evaluation $OT Eval Moderate Complexity: 1 Mod OT Treatments $Self Care/Home Management : 8-22 mins  Kathie Dike, M.S. OTR/L  04/12/23, 9:41 AM  ascom [phone]

## 2023-04-12 NOTE — TOC Progression Note (Signed)
Transition of Care [facility]) - Progression Note    Patient Details  Name: Teresa Sherman MRN: [id number] Date of Birth: [birth year]  Transition of Care [facility]) CM/SW Contact  Marlowe Sax, RN Phone Number: 04/12/2023, 3:43 PM  Clinical Narrative:     Sherron Monday with Victorino Dike and she asked me to call Dorene Sorrow the patient's HCPOA I called and spoke to Dorene Sorrow, the patient is confused at baseline, he approved a bedsearch, I explained that she will still need to have Ins approval after she has a bed offer and they make a choice       Expected Discharge Plan and Services                                               Social Determinants of Health (SDOH) Interventions SDOH Screenings   Food Insecurity: No Food Insecurity (06/14/2021)   Received from [facility]  Transportation Needs: No Transportation Needs (11/09/2022)   Received from [facility]  Financial Resource Strain: Low Risk  (06/14/2021)   Received from [facility]  Physical Activity: Sufficiently Active (10/30/2021)   Received from [facility]  Social Connections: Socially Isolated (10/30/2021)   Received from [facility]  Stress: No Stress Concern Present (10/30/2021)   Received from [facility]  Tobacco Use: Low Risk  (04/11/2023)  Health Literacy: Medium Risk (10/30/2021)   Received from [facility]    Readmission Risk Interventions     No data to display

## 2023-04-12 NOTE — Progress Notes (Signed)
PHARMACY - PHYSICIAN COMMUNICATION CRITICAL VALUE ALERT - BLOOD CULTURE IDENTIFICATION (BCID)  Teresa Sherman is an 77 y.o. female who presented to [facility] on 04/11/2023 with a chief complaint of confusion and COVID positive.    Assessment:  blood cultures from 8/19 with [facility] in 1 of 4 bottles currently,  BCID detects staphylococcus species (NOT S aureus nor S. Epidermidis).    Name of physician (or Provider) Contacted: Dr Dannial Monarch  Current antibiotics: Ceftriaxone/azithromycin   Changes to prescribed antibiotics recommended:  Recommendations accepted by provider - monitor on current therapy as blood culture likely contaminant.    Results for orders placed or performed during the hospital encounter of 04/11/23  Blood Culture ID Panel (Reflexed) (Collected: 04/11/2023  5:04 PM)  Result Value Ref Range   Enterococcus faecalis NOT DETECTED NOT DETECTED   Enterococcus Faecium NOT DETECTED NOT DETECTED   Listeria monocytogenes NOT DETECTED NOT DETECTED   Staphylococcus species DETECTED (A) NOT DETECTED   Staphylococcus aureus (BCID) NOT DETECTED NOT DETECTED   Staphylococcus epidermidis NOT DETECTED NOT DETECTED   Staphylococcus lugdunensis NOT DETECTED NOT DETECTED   Streptococcus species NOT DETECTED NOT DETECTED   Streptococcus agalactiae NOT DETECTED NOT DETECTED   Streptococcus pneumoniae NOT DETECTED NOT DETECTED   Streptococcus pyogenes NOT DETECTED NOT DETECTED   A.calcoaceticus-baumannii NOT DETECTED NOT DETECTED   Bacteroides fragilis NOT DETECTED NOT DETECTED   Enterobacterales NOT DETECTED NOT DETECTED   Enterobacter cloacae complex NOT DETECTED NOT DETECTED   Escherichia coli NOT DETECTED NOT DETECTED   Klebsiella aerogenes NOT DETECTED NOT DETECTED   Klebsiella oxytoca NOT DETECTED NOT DETECTED   Klebsiella pneumoniae NOT DETECTED NOT DETECTED   Proteus species NOT DETECTED NOT DETECTED   Salmonella species NOT DETECTED NOT DETECTED   Serratia marcescens NOT  DETECTED NOT DETECTED   Haemophilus influenzae NOT DETECTED NOT DETECTED   Neisseria meningitidis NOT DETECTED NOT DETECTED   Pseudomonas aeruginosa NOT DETECTED NOT DETECTED   Stenotrophomonas maltophilia NOT DETECTED NOT DETECTED   Candida albicans NOT DETECTED NOT DETECTED   Candida auris NOT DETECTED NOT DETECTED   Candida glabrata NOT DETECTED NOT DETECTED   Candida krusei NOT DETECTED NOT DETECTED   Candida parapsilosis NOT DETECTED NOT DETECTED   Candida tropicalis NOT DETECTED NOT DETECTED   Cryptococcus neoformans/gattii NOT DETECTED NOT DETECTED   Juliette Alcide, PharmD, BCPS, BCIDP Work Cell: [phone] 04/12/2023 12:24 PM

## 2023-04-12 NOTE — Progress Notes (Signed)
SLP Cancellation Note  Patient Details Name: Cameo Levings MRN: [id number] DOB: 14-Nov-1945   Cancelled treatment:       Reason Eval/Treat Not Completed: Other (comment);Fatigue/lethargy limiting ability to participate  Pt not able to maintain alertness with this Clinical research associate thru multiple attempts. Possible fatigue after OT/PT evaluations. In secure chat with pt's MD and nurse, recommend nursing administer swallow screen and reconsult ST if there are any additional needs. All were in agreement with plan.   Yanisa Goodgame 04/12/2023, 2:06 PM

## 2023-04-12 NOTE — NC FL2 (Signed)
MEDICAID FL2 LEVEL OF CARE FORM     IDENTIFICATION  Patient Name: Teresa Sherman Birthdate: 06-09-1946 Sex: female Admission Date (Current Location): 04/11/2023  [facility] and [place] Number:  Chiropodist and Address:  [facility], [address], [place], [place] [address]      Provider Number: [id number]  Attending Physician Name and Address:  Gillis Santa, MD  Relative Name and Phone Number:  Romeo Rabon [phone]    Current Level of Care: Hospital Recommended Level of Care: Skilled Nursing Facility Prior Approval Number:    Date Approved/Denied:   PASRR Number: [id number]  Discharge Plan: SNF    Current Diagnoses: Patient Active Problem List   Diagnosis Date Noted   Severe sepsis (HCC) 04/11/2023   CAP (community acquired pneumonia) 04/11/2023   Pneumonia due to COVID-19 virus 04/11/2023   Rhabdomyolysis 04/11/2023   AKI (acute kidney injury) (HCC) 04/11/2023   Acute metabolic encephalopathy 04/11/2023   Urinary tract infection 04/11/2023   Anemia 04/11/2023   Insomnia 12/04/2021   Altered mental status 11/29/2021   Hyperlipidemia 11/29/2021   History of stroke 11/29/2021   Fibrocystic breast 01/26/2015   History of colon polyps 01/26/2015   Migraine aura without headache 01/26/2015   Combined fat and carbohydrate induced hyperlipemia 01/26/2015   Encounter for aftercare for long-term (current) use of antibiotics 01/26/2015   Neoplasm of uncertain behavior of skin 01/26/2015   Colonic constipation 01/26/2015   Disordered sleep 03/06/2014   Spinal stenosis of lumbar region 07/05/2013   Anxiety state, unspecified 07/05/2013   Depression with anxiety 07/05/2013   Unspecified constipation 07/05/2013   Gait instability 07/05/2013   Fusion of spine of lumbar region 07/05/2013    Orientation RESPIRATION BLADDER Height & Weight     Self, Place  Normal, O2 (4 liters) Incontinent, External catheter Weight:  62.1 kg Height:  5' (152.4 cm)  BEHAVIORAL SYMPTOMS/MOOD NEUROLOGICAL BOWEL NUTRITION STATUS      Incontinent Diet (See DC summary)  AMBULATORY STATUS COMMUNICATION OF NEEDS Skin   Extensive Assist Verbally Normal                       Personal Care Assistance Level of Assistance  Bathing, Feeding, Dressing Bathing Assistance: Limited assistance Feeding assistance: Limited assistance Dressing Assistance: Maximum assistance     Functional Limitations Info  Sight, Hearing, Speech Sight Info: Adequate Hearing Info: Adequate Speech Info: Adequate    SPECIAL CARE FACTORS FREQUENCY  PT (By licensed PT), OT (By licensed OT)     PT Frequency: 5 times per week OT Frequency: 5 times per week            Contractures Contractures Info: Not present    Additional Factors Info  Code Status, Allergies Code Status Info: Full Code Allergies Info: Codeine, Duloxetine Hcl, Hydroxyzine, Monosodium Glutamate, Nitrates, Organic, Phosphorus, Sodium Benzoate (Nutritional Supplements), Sodium Phosphates, Zithromax (Azithromycin)           Current Medications (04/12/2023):  This is the current hospital active medication list Current Facility-Administered Medications  Medication Dose Route Frequency Provider Last Rate Last Admin   0.9 %  sodium chloride infusion   Intravenous Continuous Gillis Santa, MD       acetaminophen (TYLENOL) tablet 650 mg  650 mg Oral Q6H PRN Andris Baumann, MD       Or   acetaminophen (TYLENOL) suppository 650 mg  650 mg Rectal Q6H PRN Andris Baumann, MD  albuterol (PROVENTIL) (2.5 MG/3ML) 0.083% nebulizer solution 2.5 mg  2.5 mg Nebulization Q2H PRN Andris Baumann, MD       aspirin chewable tablet 81 mg  81 mg Oral Daily Lindajo Royal V, MD   81 mg at 04/12/23 5409   azithromycin (ZITHROMAX) 500 mg in sodium chloride 0.9 % 250 mL IVPB  500 mg Intravenous Q24H Andris Baumann, MD   Stopped at 04/11/23 2207   cefTRIAXone (ROCEPHIN) 2 g in sodium chloride  0.9 % 100 mL IVPB  2 g Intravenous Q24H Andris Baumann, MD       donepezil (ARICEPT) tablet 10 mg  10 mg Oral QHS Andris Baumann, MD       enoxaparin (LOVENOX) injection 40 mg  40 mg Subcutaneous QPM Gillis Santa, MD       guaiFENesin (ROBITUSSIN) 100 MG/5ML liquid 15 mL  15 mL Oral TID Gillis Santa, MD       HYDROcodone-acetaminophen (NORCO/VICODIN) 5-325 MG per tablet 1-2 tablet  1-2 tablet Oral Q4H PRN Andris Baumann, MD       lactated ringers infusion  150 mL/hr Intravenous Continuous Lindajo Royal V, MD 150 mL/hr at 04/12/23 1131 150 mL/hr at 04/12/23 1131   ondansetron (ZOFRAN) tablet 4 mg  4 mg Oral Q6H PRN Andris Baumann, MD       Or   ondansetron [facility]) injection 4 mg  4 mg Intravenous Q6H PRN Andris Baumann, MD       saccharomyces boulardii (FLORASTOR) capsule 250 mg  250 mg Oral BID Gillis Santa, MD   250 mg at 04/12/23 1129   traZODone (DESYREL) tablet 25 mg  25 mg Oral QHS PRN Andris Baumann, MD         Discharge Medications: Please see discharge summary for a list of discharge medications.  Relevant Imaging Results:  Relevant Lab Results:   Additional Information SS# [id number]  Marlowe Sax, RN

## 2023-04-13 ENCOUNTER — Other Ambulatory Visit: Payer: Self-pay

## 2023-04-13 DIAGNOSIS — R6520 Severe sepsis without septic shock: Secondary | ICD-10-CM | POA: Diagnosis not present

## 2023-04-13 DIAGNOSIS — U071 COVID-19: Secondary | ICD-10-CM | POA: Diagnosis not present

## 2023-04-13 DIAGNOSIS — A419 Sepsis, unspecified organism: Secondary | ICD-10-CM | POA: Diagnosis not present

## 2023-04-13 LAB — GASTROINTESTINAL PANEL BY PCR, STOOL (REPLACES STOOL CULTURE)

## 2023-04-13 LAB — BASIC METABOLIC PANEL
Anion gap: 9 (ref 5–15)
BUN: 23 mg/dL (ref 8–23)
CO2: 23 mmol/L (ref 22–32)
Calcium: 7.5 mg/dL — ABNORMAL LOW (ref 8.9–10.3)
Chloride: 108 mmol/L (ref 98–111)
Creatinine, Ser: 0.64 mg/dL (ref 0.44–1.00)
GFR, Estimated: >60 mL/min (ref >60)
Glucose, Bld: 91 mg/dL (ref 70–99)
Potassium: 3 mmol/L — ABNORMAL LOW (ref 3.5–5.1)
Sodium: 140 mmol/L (ref 135–145)

## 2023-04-13 LAB — CULTURE, BLOOD (ROUTINE X 2): Special Requests: ADEQUATE

## 2023-04-13 LAB — PHOSPHORUS: Phosphorus: 2.6 mg/dL (ref 2.5–4.6)

## 2023-04-13 LAB — URINE CULTURE: Culture: NO GROWTH

## 2023-04-13 LAB — MAGNESIUM: Magnesium: 2.2 mg/dL (ref 1.7–2.4)

## 2023-04-13 LAB — CBC
HCT: 36.9 % (ref 36.0–46.0)
Hemoglobin: 12.5 g/dL (ref 12.0–15.0)
MCH: 31.1 pg (ref 26.0–34.0)
MCHC: 33.9 g/dL (ref 30.0–36.0)
MCV: 91.8 fL (ref 80.0–100.0)
Platelets: 120 10*3/uL — ABNORMAL LOW (ref 150–400)
RBC: 4.02 MIL/uL (ref 3.87–5.11)
RDW: 14 % (ref 11.5–15.5)
WBC: 6.8 10*3/uL (ref 4.0–10.5)
nRBC: 0 % (ref 0.0–0.2)

## 2023-04-13 LAB — C DIFFICILE QUICK SCREEN W PCR REFLEX
C Diff antigen: NEGATIVE
C Diff interpretation: NOT DETECTED
C Diff toxin: NEGATIVE

## 2023-04-13 LAB — CK: Total CK: 1439 U/L — ABNORMAL HIGH (ref 38–234)

## 2023-04-13 MED ORDER — CHLORHEXIDINE GLUCONATE CLOTH 2 % EX PADS
6.0000 | MEDICATED_PAD | Freq: Every day | CUTANEOUS | Status: DC
  Administered 2023-04-13 – 2023-05-03 (×22): 6 via TOPICAL

## 2023-04-13 MED ORDER — POTASSIUM CHLORIDE 20 MEQ PO PACK
40.0000 meq | PACK | Freq: Once | ORAL | Status: AC
  Administered 2023-04-13: 40 meq via ORAL
  Filled 2023-04-13: qty 2

## 2023-04-13 NOTE — Plan of Care (Signed)
  Problem: Pain Managment: Goal: General experience of comfort will improve Outcome: Progressing   Problem: Safety: Goal: Ability to remain free from injury will improve Outcome: Progressing   Problem: Skin Integrity: Goal: Risk for impaired skin integrity will decrease Outcome: Progressing   

## 2023-04-13 NOTE — Progress Notes (Signed)
PROGRESS NOTE    Teresa Sherman  JXB:[id number] DOB: 01-Dec-1945 DOA: 04/11/2023 [facility]    Assessment & Plan:   Principal Problem:   Severe sepsis (HCC) Active Problems:   CAP (community acquired pneumonia)   Pneumonia due to COVID-19 virus   Urinary tract infection   AKI (acute kidney injury) (HCC)   Rhabdomyolysis   Acute metabolic encephalopathy   Anemia   Depression with anxiety   History of stroke  Assessment and Plan: Severe sepsis: met criteria w/ tachycardia and tachypnea with soft blood pressure, AKI, AMS, lactic acidosis with elevated procalcitonin as per Dr. Lucianne Muss. Possibly secondary to COVID19 pneumonia vs bacterial co-infection vs UTI.   Unlikely bacteremia: 1/4 growing staph, likely containment. Will repeat blood cxs.   COVID19 pneumonia: w/ vs. possible co-bacterial infection. Continue on IV rocephin, azithromycin, bronchodilators & encourage incentive spirometry. Continue on airborne & contact precautions     UTI: hx pseudomonas and ESBL E. coli UTI 11/2021. Urine cx is pending. Continue on IV rocephin   Urinary retention: bladder scan showed 800 mL, Foley catheter inserted on 8/20.    AKI : resolved   Rhabdomyolysis: unknown whether traumatic or not. CK is still elevated.    Acute metabolic encephalopathy: mental status improved today. Likely secondary to above infections. CT head shows no acute intracranial findings    Anemia: resolved. H&H are WNL. Possibly dilutional vs lab error.   Hx of CVA: continue on statin, aspirin    Depression: severity unknonw. Continue on home dose of trazodone         DVT prophylaxis: lovenox  Code Status: full  Family Communication: Disposition Plan: likely d/c to SNF   Level of care: Telemetry Surgical Consultants:    Procedures:   Antimicrobials: rocephin, azithromycin   Subjective: Pt c/o being thirsty   Objective: Vitals:   04/12/23 0739 04/12/23 1550 04/12/23 1930 04/12/23 2213   BP: (!) 151/95 132/74  130/81  Pulse: 94 95  90  Resp: 17 18  20   Temp: 98.3 F (36.8 C) 98 F (36.7 C) 99 F (37.2 C) 98.1 F (36.7 C)  TempSrc:    Oral  SpO2: 98% 93% 96% 97%  Weight:      Height:        Intake/Output Summary (Last 24 hours) at 04/13/2023 0741 Last data filed at 04/12/2023 1900 Gross per 24 hour  Intake 1612.32 ml  Output 975 ml  Net 637.32 ml   Filed Weights   04/11/23 1702  Weight: 62.1 kg    Examination:  General exam: Appears frustrated Respiratory system: course breath sounds b/l  Cardiovascular system: S1 & S2 +. No  rubs, gallops or clicks Gastrointestinal system: Abdomen is nondistended, soft and nontender. Normal bowel sounds heard. Central nervous system: Alert and oriented x 3. Moves all extremities Psychiatry: Judgement and insight appears improved. Frustrated mood and affect    Data Reviewed: I have personally reviewed following labs and imaging studies  CBC: Recent Labs  Lab 04/11/23 1704 04/12/23 0104 04/13/23 0418  WBC 8.1  --  6.8  NEUTROABS 7.0  --   --   HGB 7.8* 13.6 12.5  HCT 23.6*  --  36.9  MCV 95.2  --  91.8  PLT 101*  --  120*   Basic Metabolic Panel: Recent Labs  Lab 04/11/23 1704 04/12/23 0104 04/13/23 0418  NA 138 141 140  K 4.2 3.7 3.0*  CL 105 107 108  CO2 21* 24 23  GLUCOSE 137*  122* 91  BUN 63* 48* 23  CREATININE 1.78* 1.15* 0.64  CALCIUM 8.8* 8.3* 7.5*  MG  --   --  2.2  PHOS  --   --  2.6   GFR: Estimated Creatinine Clearance: 50 mL/min (by C-G formula based on SCr of 0.64 mg/dL). Liver Function Tests: Recent Labs  Lab 04/11/23 1704 04/12/23 0104  AST 45* 50*  ALT 23 21  ALKPHOS 63 52  BILITOT 1.2 0.9  PROT 7.6 6.6  ALBUMIN 3.4* 2.9*   No results for input(s): "LIPASE", "AMYLASE" in the last 168 hours. No results for input(s): "AMMONIA" in the last 168 hours. Coagulation Profile: Recent Labs  Lab 04/11/23 1704  INR 1.2   Cardiac Enzymes: Recent Labs  Lab 04/11/23 1717  04/13/23 0418  CKTOTAL 1,438* 1,439*   BNP (last 3 results) No results for input(s): "PROBNP" in the last 8760 hours. HbA1C: No results for input(s): "HGBA1C" in the last 72 hours. CBG: No results for input(s): "GLUCAP" in the last 168 hours. Lipid Profile: No results for input(s): "CHOL", "HDL", "LDLCALC", "TRIG", "CHOLHDL", "LDLDIRECT" in the last 72 hours. Thyroid Function Tests: No results for input(s): "TSH", "T4TOTAL", "FREET4", "T3FREE", "THYROIDAB" in the last 72 hours. Anemia Panel: No results for input(s): "VITAMINB12", "FOLATE", "FERRITIN", "TIBC", "IRON", "RETICCTPCT" in the last 72 hours. Sepsis Labs: Recent Labs  Lab 04/11/23 1700 04/11/23 1717 04/11/23 1918 04/12/23 0104  PROCALCITON  --  7.73  --  4.12  LATICACIDVEN 2.4*  --  2.0*  --     Recent Results (from the past 240 hour(s))  Culture, blood (Routine x 2)     Status: None (Preliminary result)   Collection Time: 04/11/23  5:04 PM   Specimen: BLOOD  Result Value Ref Range Status   Specimen Description   Final    BLOOD RIGHT ANTECUBITAL Performed at [facility], [address] [place], [place] [address]    Special Requests   Final    BOTTLES DRAWN AEROBIC AND ANAEROBIC Blood Culture adequate volume Performed at [facility], [address], [place], [place] [address]    Culture  Setup Time   Final    AEROBIC BOTTLE ONLY GRAM POSITIVE COCCI CRITICAL RESULT CALLED TO, READ BACK BY AND VERIFIED WITH: Gloriann Loan 04/12/23 1151 MW Performed at [facility], [address] [place], [place] [address]    Culture GRAM POSITIVE COCCI  Final   Report Status PENDING  Incomplete  Blood Culture ID Panel (Reflexed)     Status: Abnormal   Collection Time: 04/11/23  5:04 PM  Result Value Ref Range Status   Enterococcus faecalis NOT DETECTED NOT DETECTED Final   Enterococcus Faecium NOT DETECTED NOT DETECTED Final   Listeria monocytogenes NOT DETECTED NOT DETECTED Final    Staphylococcus species DETECTED (A) NOT DETECTED Final    Comment: CRITICAL RESULT CALLED TO, READ BACK BY AND VERIFIED WITH: TREY GREENWOOD 04/12/23 1151 MW    Staphylococcus aureus (BCID) NOT DETECTED NOT DETECTED Final   Staphylococcus epidermidis NOT DETECTED NOT DETECTED Final   Staphylococcus lugdunensis NOT DETECTED NOT DETECTED Final   Streptococcus species NOT DETECTED NOT DETECTED Final   Streptococcus agalactiae NOT DETECTED NOT DETECTED Final   Streptococcus pneumoniae NOT DETECTED NOT DETECTED Final   Streptococcus pyogenes NOT DETECTED NOT DETECTED Final   A.calcoaceticus-baumannii NOT DETECTED NOT DETECTED Final   Bacteroides fragilis NOT DETECTED NOT DETECTED Final   Enterobacterales NOT DETECTED NOT DETECTED Final   Enterobacter cloacae complex  NOT DETECTED NOT DETECTED Final   Escherichia coli NOT DETECTED NOT DETECTED Final   Klebsiella aerogenes NOT DETECTED NOT DETECTED Final   Klebsiella oxytoca NOT DETECTED NOT DETECTED Final   Klebsiella pneumoniae NOT DETECTED NOT DETECTED Final   Proteus species NOT DETECTED NOT DETECTED Final   Salmonella species NOT DETECTED NOT DETECTED Final   Serratia marcescens NOT DETECTED NOT DETECTED Final   Haemophilus influenzae NOT DETECTED NOT DETECTED Final   Neisseria meningitidis NOT DETECTED NOT DETECTED Final   Pseudomonas aeruginosa NOT DETECTED NOT DETECTED Final   Stenotrophomonas maltophilia NOT DETECTED NOT DETECTED Final   Candida albicans NOT DETECTED NOT DETECTED Final   Candida auris NOT DETECTED NOT DETECTED Final   Candida glabrata NOT DETECTED NOT DETECTED Final   Candida krusei NOT DETECTED NOT DETECTED Final   Candida parapsilosis NOT DETECTED NOT DETECTED Final   Candida tropicalis NOT DETECTED NOT DETECTED Final   Cryptococcus neoformans/gattii NOT DETECTED NOT DETECTED Final    Comment: Performed at [facility], [address] [place], [place] [address]  SARS Coronavirus 2 by RT PCR (hospital  order, performed in [facility] hospital lab) *cepheid single result test* Anterior Nasal Swab     Status: Abnormal   Collection Time: 04/11/23  5:16 PM   Specimen: Anterior Nasal Swab  Result Value Ref Range Status   SARS Coronavirus 2 by RT PCR POSITIVE (A) NEGATIVE Final    Comment: (NOTE) SARS-CoV-2 target nucleic acids are DETECTED  SARS-CoV-2 RNA is generally detectable in upper respiratory specimens  during the acute phase of infection.  Positive results are indicative  of the presence of the identified virus, but do not rule out bacterial infection or co-infection with other pathogens not detected by the test.  Clinical correlation with patient history and  other diagnostic information is necessary to determine patient infection status.  The expected result is negative.  Fact Sheet for Patients:   RoadLapTop.co.za   Fact Sheet for Healthcare Providers:   http://kim-miller.com/    This test is not yet approved or cleared by the [place] FDA and  has been authorized for detection and/or diagnosis of SARS-CoV-2 by FDA under an Emergency Use Authorization (EUA).  This EUA will remain in effect (meaning this test can be used) for the duration of  the COVID-19 declaration under Section 564(b)(1)  of the Act, 21 U.S.C. section 360-bbb-3(b)(1), unless the authorization is terminated or revoked sooner.   Performed at [facility], [address] [place], [place] [address]   Culture, blood (Routine x 2)     Status: None (Preliminary result)   Collection Time: 04/11/23  7:18 PM   Specimen: BLOOD  Result Value Ref Range Status   Specimen Description BLOOD BLOOD RIGHT ARM  Final   Special Requests   Final    BOTTLES DRAWN AEROBIC AND ANAEROBIC Blood Culture adequate volume   Culture   Final    NO GROWTH < 12 HOURS Performed at [facility], [address] [place], [place] [address]    Report Status PENDING   Incomplete         Radiology Studies: CT HEAD WO CONTRAST ( )  Result Date: 04/11/2023 CLINICAL DATA:  Mental status change, unknown cause EXAM: CT HEAD WITHOUT CONTRAST TECHNIQUE: Contiguous axial images were obtained from the base of the skull through the vertex without intravenous contrast. RADIATION DOSE REDUCTION: This exam was performed according to the departmental dose-optimization program which includes automated exposure control, adjustment of  the mA and/or kV according to patient size and/or use of iterative reconstruction technique. COMPARISON:  MRI head 11/30/2021 FINDINGS: Brain: No evidence of large-territorial acute infarction. No parenchymal hemorrhage. No mass lesion. No extra-axial collection. No mass effect or midline shift. No hydrocephalus. Basilar cisterns are patent. Vascular: No hyperdense vessel. Skull: No acute fracture or focal lesion. Sinuses/Orbits: Paranasal sinuses and mastoid air cells are clear. The orbits are unremarkable. Other: None. IMPRESSION: No acute intracranial abnormality. Electronically Signed   By: Tish Frederickson M.D.   On: 04/11/2023 21:31   DG Chest 1 View  Result Date: 04/11/2023 CLINICAL DATA:  [id number] Sepsis (HCC) [id number] l. Pt normally oriented x4 per EMS, oriented to self and time today. EMS reports possible UTI. Pt does have congested cough, urine is foul smelling. EXAM: CHEST  1 VIEW COMPARISON:  Chest x-ray 12/05/2021 FINDINGS: The heart and mediastinal contours are within normal limits. Right lower lung zone airspace opacity. No pulmonary edema. No pleural effusion. No pneumothorax. No acute osseous abnormality. IMPRESSION: Right lower lung zone airspace opacity. Followup PA and lateral chest X-ray is recommended in 3-4 weeks following trial of antibiotic therapy to ensure resolution and exclude underlying malignancy. Electronically Signed   By: Tish Frederickson M.D.   On: 04/11/2023 18:39        Scheduled Meds:  aspirin  81 mg Oral  Daily   donepezil  10 mg Oral QHS   enoxaparin (LOVENOX) injection  40 mg Subcutaneous QPM   guaiFENesin  15 mL Oral TID   potassium chloride  40 mEq Oral Once   saccharomyces boulardii  250 mg Oral BID   Continuous Infusions:  sodium chloride 100 mL/hr at 04/12/23 1900   azithromycin 500 mg (04/12/23 2151)   cefTRIAXone (ROCEPHIN)  IV Stopped (04/12/23 1636)     LOS: 2 days    Time spent: 35 mins     Charise Killian, MD [facility] Pager [phone]  If 7PM-7AM, please contact night-coverage [email or web address] 04/13/2023, 7:41 AM

## 2023-04-13 NOTE — TOC Progression Note (Signed)
Transition of Care [facility]) - Progression Note    Patient Details  Name: Teresa Sherman MRN: [id number] Date of Birth: Nov 08, 1945  [facility]) CM/SW Contact  Marlowe Sax, RN Phone Number: 04/13/2023, 2:14 PM  Clinical Narrative:     Called to let the patients POA and Brother Dorene Sorrow know that I have reached out to the facilities and requested them to review for a bed offer no offers at this time, expanded the bed search      Expected Discharge Plan and Services                                               Social Determinants of Health (SDOH) Interventions SDOH Screenings   Food Insecurity: No Food Insecurity (04/13/2023)  Housing: Low Risk  (04/13/2023)  Transportation Needs: No Transportation Needs (04/13/2023)  Utilities: Not At Risk (04/13/2023)  Financial Resource Strain: Low Risk  (06/14/2021)   [facility]  Physical Activity: Sufficiently Active (10/30/2021)   Received from [facility]  Social Connections: Socially Isolated (10/30/2021)   Received from [facility]  Stress: No Stress Concern Present (10/30/2021)   Received from [facility]  Tobacco Use: Low Risk  (04/11/2023)  Health Literacy: Medium Risk (10/30/2021)   Received from [facility]    Readmission Risk Interventions     No data to display

## 2023-04-14 ENCOUNTER — Inpatient Hospital Stay: Payer: Medicare HMO

## 2023-04-14 DIAGNOSIS — J1282 Pneumonia due to coronavirus disease 2019: Secondary | ICD-10-CM | POA: Diagnosis not present

## 2023-04-14 DIAGNOSIS — R6520 Severe sepsis without septic shock: Secondary | ICD-10-CM | POA: Diagnosis not present

## 2023-04-14 DIAGNOSIS — A419 Sepsis, unspecified organism: Secondary | ICD-10-CM | POA: Diagnosis not present

## 2023-04-14 DIAGNOSIS — U071 COVID-19: Secondary | ICD-10-CM | POA: Diagnosis not present

## 2023-04-14 LAB — BASIC METABOLIC PANEL
Anion gap: 7 (ref 5–15)
BUN: 16 mg/dL (ref 8–23)
CO2: 24 mmol/L (ref 22–32)
Calcium: 7.6 mg/dL — ABNORMAL LOW (ref 8.9–10.3)
Chloride: 106 mmol/L (ref 98–111)
Creatinine, Ser: 0.51 mg/dL (ref 0.44–1.00)
GFR, Estimated: >60 mL/min (ref >60)
Glucose, Bld: 92 mg/dL (ref 70–99)
Potassium: 2.7 mmol/L — CL (ref 3.5–5.1)
Sodium: 137 mmol/L (ref 135–145)

## 2023-04-14 LAB — BLOOD GAS, ARTERIAL
Acid-Base Excess: 2.9 mmol/L — ABNORMAL HIGH (ref 0.0–2.0)
Bicarbonate: 25.9 mmol/L (ref 20.0–28.0)
O2 Content: 5 L/min
O2 Saturation: 95.6 %
Patient temperature: 37
pCO2 arterial: 34 mmHg (ref 32–48)
pH, Arterial: 7.49 — ABNORMAL HIGH (ref 7.35–7.45)
pO2, Arterial: 64 mmHg — ABNORMAL LOW (ref 83–108)

## 2023-04-14 LAB — CBC
HCT: 36.1 % (ref 36.0–46.0)
Hemoglobin: 12.3 g/dL (ref 12.0–15.0)
MCH: 30.8 pg (ref 26.0–34.0)
MCHC: 34.1 g/dL (ref 30.0–36.0)
MCV: 90.3 fL (ref 80.0–100.0)
Platelets: 136 10*3/uL — ABNORMAL LOW (ref 150–400)
RBC: 4 MIL/uL (ref 3.87–5.11)
RDW: 13.9 % (ref 11.5–15.5)
WBC: 6 10*3/uL (ref 4.0–10.5)
nRBC: 0 % (ref 0.0–0.2)

## 2023-04-14 LAB — MAGNESIUM: Magnesium: 2.1 mg/dL (ref 1.7–2.4)

## 2023-04-14 LAB — PHOSPHORUS: Phosphorus: 2 mg/dL — ABNORMAL LOW (ref 2.5–4.6)

## 2023-04-14 LAB — POTASSIUM: Potassium: 3 mmol/L — ABNORMAL LOW (ref 3.5–5.1)

## 2023-04-14 LAB — CK: Total CK: 937 U/L — ABNORMAL HIGH (ref 38–234)

## 2023-04-14 MED ORDER — POTASSIUM CHLORIDE CRYS ER 20 MEQ PO TBCR: 40.0000 meq | EXTENDED_RELEASE_TABLET | Freq: Once | ORAL | Status: DC

## 2023-04-14 MED ORDER — FUROSEMIDE 10 MG/ML IJ SOLN
40.0000 mg | Freq: Once | INTRAMUSCULAR | Status: AC
  Administered 2023-04-14: 40 mg via INTRAVENOUS
  Filled 2023-04-14: qty 4

## 2023-04-14 MED ORDER — ACETAMINOPHEN 10 MG/ML IV SOLN
1000.0000 mg | Freq: Four times a day (QID) | INTRAVENOUS | Status: AC
  Administered 2023-04-14 – 2023-04-15 (×4): 1000 mg via INTRAVENOUS
  Filled 2023-04-14 (×4): qty 100

## 2023-04-14 MED ORDER — POTASSIUM PHOSPHATES 15 MMOLE/5ML IV SOLN
15.0000 mmol | Freq: Once | INTRAVENOUS | Status: AC
  Administered 2023-04-14: 15 mmol via INTRAVENOUS
  Filled 2023-04-14: qty 5

## 2023-04-14 MED ORDER — POTASSIUM CHLORIDE 10 MEQ/100ML IV SOLN
10.0000 meq | INTRAVENOUS | Status: AC
  Administered 2023-04-14 (×4): 10 meq via INTRAVENOUS
  Filled 2023-04-14 (×4): qty 100

## 2023-04-14 NOTE — Progress Notes (Signed)
Dr. Para March updated about critical lab result of potassium 2.7.  At 0513, followed up with Dr. Para March regarding potassium of 2.7 and updated about result of xray and ABG.

## 2023-04-14 NOTE — Progress Notes (Signed)
PHARMACY CONSULT NOTE - ELECTROLYTES  Pharmacy Consult for Electrolyte Monitoring and Replacement   Recent Labs: Height: 5' (152.4 cm) Weight: 62.1 kg (136 lb 14.4 oz) IBW/kg (Calculated) : 45.5 Estimated Creatinine Clearance: 50 mL/min (by C-G formula based on SCr of 0.51 mg/dL). Potassium (mmol/L)  Date Value  04/14/2023 3.0 (L)  11/26/2013 3.8   Magnesium (mg/dL)  Date Value  62/13/0865 2.1   Calcium (mg/dL)  Date Value  78/46/9629 7.6 (L)   Calcium, Total (mg/dL)  Date Value  52/84/1324 9.5   Albumin (g/dL)  Date Value  40/05/2724 2.9 (L)  04/30/2015 4.3  11/26/2013 3.9   Phosphorus (mg/dL)  Date Value  36/64/4034 2.0 (L)   Sodium (mmol/L)  Date Value  04/14/2023 137  04/30/2015 140  11/26/2013 142   Corrected Ca: 8.5 mg/dL  Assessment  Teresa Sherman is a 77 y.o. female presenting with Severe sepsis secondary to pneumonia . PMH significant for CVA, Anemia, and depression. Pharmacy has been consulted to monitor and replace electrolytes.  Diet: NPO  Goal of Therapy: Electrolytes WNL  Plan:  Will give KCL 40 mEq PO x1 Check BMP and Phos with AM labs  Thank you for involving pharmacy in this patient's care.   Rockwell Alexandria, PharmD Clinical Pharmacist 04/14/2023 9:54 PM

## 2023-04-14 NOTE — Progress Notes (Signed)
Yellow MEWS activated, patient now requiring more oxygen at 5 lpm with sats at 90, RR at 36 bpm full minute while rested, noted active abdominal breathing. Dr. Para March came at bedside after notification.  04/14/23 0230  Assess: MEWS Score  Temp 99.1 F (37.3 C)  BP (!) 140/81  MAP (mmHg) 99  Pulse Rate 77  Resp (!) 36  SpO2 90 %  O2 Device Nasal Cannula  Assess: MEWS Score  MEWS Temp 0  MEWS Systolic 0  MEWS Pulse 0  MEWS RR 3  MEWS LOC 0  MEWS Score 3  MEWS Score Color Yellow  Assess: if the MEWS score is Yellow or Red  Were vital signs accurate and taken at a resting state? Yes  Does the patient meet 2 or more of the SIRS criteria? Yes  Does the patient have a confirmed or suspected source of infection? Yes  MEWS guidelines implemented  Yes, yellow  Treat  MEWS Interventions Considered administering scheduled or prn medications/treatments as ordered  Take Vital Signs  Increase Vital Sign Frequency  Yellow: Q2hr x1, continue Q4hrs until patient remains green for 12hrs  Escalate  MEWS: Escalate Yellow: Discuss with charge nurse and consider notifying provider and/or RRT  Notify: Charge Nurse/RN  Name of Charge Nurse/RN Notified [email or web address] RN  Provider Notification  Provider Name/Title Dr. Doylene Canard  Time Provider Notified [phone]  Method of Notification Page  Notification Reason  (yellow mE@WS )  Provider response At bedside  Date of Provider Response 04/14/23  Time of Provider Response [id number]  Assess: SIRS CRITERIA  SIRS Temperature  0  SIRS Pulse 0  SIRS Respirations  1  SIRS WBC 0  SIRS Score Sum  1

## 2023-04-14 NOTE — Progress Notes (Signed)
Occupational Therapy Treatment Patient Details Name: Teresa Sherman MRN: [id number] DOB: September 17, 1945 Today's Date: 04/14/2023   History of present illness Teresa Sherman is a 77 y.o. female with medical history significant for Prior stroke, depression with anxiety, prior ESBL E. coli, who presents to the emergency room by EMS with altered mental status.  She was found after wellness check was requested after she had not been seen in 2 days. Admitted to hospital wiith sepsis and COVID + with concerns for UTI.   OT comments  Ms Chatfield was seen for OT/PT co-treatment on this date. Upon arrival to room pt reclined in bed, agreeable to tx. Pt requires MIN A x2 + HHA sit<>stand x2 and steps along bed. MIN A hand washing standing at sink, step by step cues to sequence. Pt tearful throughout session, requires redirection to task. Pt making progress toward goals, will continue to follow POC. Discharge recommendation remains appropriate.        If plan is discharge home, recommend the following:  A lot of help with walking and/or transfers;A lot of help with bathing/dressing/bathroom;Supervision due to cognitive status;Help with stairs or ramp for entrance   Equipment Recommendations  BSC/3in1    Recommendations for Other Services      Precautions / Restrictions Precautions Precautions: Fall Restrictions Weight Bearing Restrictions: No       Mobility Bed Mobility Overal bed mobility: Needs Assistance Bed Mobility: Supine to Sit, Sit to Supine     Supine to sit: Mod assist Sit to supine: Mod assist, +2 for safety/equipment        Transfers Overall transfer level: Needs assistance Equipment used: 2 person hand held assist Transfers: Sit to/from Stand Sit to Stand: Min assist, +2 physical assistance, +2 safety/equipment                 Balance Overall balance assessment: Needs assistance Sitting-balance support: No upper extremity supported, Feet  supported Sitting balance-Leahy Scale: Fair     Standing balance support: Bilateral upper extremity supported Standing balance-Leahy Scale: Poor                             ADL either performed or assessed with clinical judgement   ADL Overall ADL's : Needs assistance/impaired                                       General ADL Comments: MIN A x2 + HHA for simulated BSC t/f. MIN A hand washing standing at sink      Cognition Arousal: Alert Behavior During Therapy: Lability Overall Cognitive Status: Impaired/Different from baseline Area of Impairment: Orientation, Following commands, Safety/judgement                 Orientation Level: Disoriented to, Time     Following Commands: Follows one step commands inconsistently, Follows one step commands with increased time Safety/Judgement: Decreased awareness of safety, Decreased awareness of deficits     General Comments: Tearful t/o session stating that her daughter is dead, intermittently redirectable                   Pertinent Vitals/ Pain       Pain Assessment Pain Assessment: No/denies pain   Frequency  Min 1X/week        Progress Toward Goals  OT Goals(current goals can now be found in  the care plan section)  Progress towards OT goals: Progressing toward goals  Acute Rehab OT Goals Patient Stated Goal: to go home OT Goal Formulation: With patient Time For Goal Achievement: 04/26/23 Potential to Achieve Goals: Fair ADL Goals Pt Will Perform Grooming: with modified independence;standing Pt Will Perform Lower Body Dressing: with modified independence;sit to/from stand Pt Will Transfer to Toilet: with modified independence;ambulating;regular height toilet  Plan      Co-evaluation    PT/OT/SLP Co-Evaluation/Treatment: Yes Reason for Co-Treatment: Necessary to address cognition/behavior during functional activity;To address functional/ADL transfers PT goals addressed  during session: Mobility/safety with mobility        AM-PAC OT "6 Clicks" Daily Activity     Outcome Measure   Help from another person eating meals?: A Little Help from another person taking care of personal grooming?: A Little Help from another person toileting, which includes using toliet, bedpan, or urinal?: A Lot Help from another person bathing (including washing, rinsing, drying)?: A Lot Help from another person to put on and taking off regular upper body clothing?: A Little Help from another person to put on and taking off regular lower body clothing?: A Lot 6 Click Score: 15    End of Session Equipment Utilized During Treatment: Oxygen  OT Visit Diagnosis: Unsteadiness on feet (R26.81);Muscle weakness (generalized) (M62.81)   Activity Tolerance Patient tolerated treatment well   Patient Left in bed;with call bell/phone within reach;with bed alarm set   Nurse Communication Mobility status        Time: 2694-8546 OT Time Calculation (min): 28 min  Charges: OT General Charges $OT Visit: 1 Visit OT Treatments $Self Care/Home Management : 8-22 mins  Kathie Dike, M.S. OTR/L  04/14/23, 3:50 PM  ascom [phone]

## 2023-04-14 NOTE — TOC Progression Note (Signed)
Transition of Care [facility]) - Progression Note    Patient Details  Name: Teresa Sherman MRN: [id number] Date of Birth: 04-Nov-1945  Transition of Care [facility]) CM/SW Contact  Marlowe Sax, RN Phone Number: 04/14/2023, 12:02 PM  Clinical Narrative:     Sherron Monday with Dorene Sorrow her brother and POA I explained that she is not medically ready yet, I do have bed offers however would need to reach out to the facilities once medically ready to see if they still have a bed, he is agreeable       Expected Discharge Plan and Services                                               Social Determinants of Health (SDOH) Interventions SDOH Screenings   Food Insecurity: No Food Insecurity (04/13/2023)  Housing: Low Risk  (04/13/2023)  Transportation Needs: No Transportation Needs (04/13/2023)  Utilities: Not At Risk (04/13/2023)  Financial Resource Strain: Low Risk  (06/14/2021)   Received from [facility], [facility]  Physical Activity: Sufficiently Active (10/30/2021)   Received from [facility]  Social Connections: Socially Isolated (10/30/2021)   Received from [facility]  Stress: No Stress Concern Present (10/30/2021)   Received from [facility]  Tobacco Use: Low Risk  (04/11/2023)  Health Literacy: Medium Risk (10/30/2021)   Received from [facility]    Readmission Risk Interventions     No data to display

## 2023-04-14 NOTE — Care Management Important Message (Signed)
Important Message  Patient Details  Name: Teresa Sherman MRN: [id number] Date of Birth: [birth year]   Medicare Important Message Given:  N/A - LOS <3 / Initial given by admissions     Olegario Messier A Shaniyah Wix 04/14/2023, 9:49 AM

## 2023-04-14 NOTE — Plan of Care (Signed)
  Problem: Respiratory: Goal: Ability to maintain adequate ventilation will improve Outcome: Not Progressing   Problem: Nutrition: Goal: Adequate nutrition will be maintained Outcome: Not Progressing   Problem: Safety: Goal: Ability to remain free from injury will improve Outcome: Not Progressing   Problem: Skin Integrity: Goal: Risk for impaired skin integrity will decrease Outcome: Not Progressing

## 2023-04-14 NOTE — Progress Notes (Signed)
PHARMACY CONSULT NOTE - ELECTROLYTES  Pharmacy Consult for Electrolyte Monitoring and Replacement   Recent Labs: Height: 5' (152.4 cm) Weight: 62.1 kg (136 lb 14.4 oz) IBW/kg (Calculated) : 45.5 Estimated Creatinine Clearance: 50 mL/min (by C-G formula based on SCr of 0.51 mg/dL). Potassium (mmol/L)  Date Value  04/14/2023 2.7 (LL)  11/26/2013 3.8   Magnesium (mg/dL)  Date Value  36/64/4034 2.1   Calcium (mg/dL)  Date Value  74/25/9563 7.6 (L)   Calcium, Total (mg/dL)  Date Value  87/56/4332 9.5   Albumin (g/dL)  Date Value  95/18/8416 2.9 (L)  04/30/2015 4.3  11/26/2013 3.9   Phosphorus (mg/dL)  Date Value  60/63/0160 2.0 (L)   Sodium (mmol/L)  Date Value  04/14/2023 137  04/30/2015 140  11/26/2013 142   Corrected Ca: 8.5 mg/dL  Assessment  Teresa Sherman is a 77 y.o. female presenting with Severe sepsis secondary to pneumonia . PMH significant for CVA, Anemia, and depression. Pharmacy has been consulted to monitor and replace electrolytes.  Diet: NPO  Goal of Therapy: Electrolytes WNL  Plan:  Patient already ordered KCL IV x 4. Will order KPhos IV x 1 dose Will order f/u K+ level at 2000 this evening, following IV Kphos infusion Check BMP and Phos with AM labs  Thank you for allowing pharmacy to be a part of this patient's care.  Gardner Candle, PharmD, BCPS Clinical Pharmacist 04/14/2023 8:03 AM

## 2023-04-14 NOTE — Progress Notes (Signed)
PROGRESS NOTE    Teresa Sherman  NUU:[id number] DOB: 1946/05/27 DOA: 04/11/2023 [facility]    Assessment & Plan:   Principal Problem:   Severe sepsis (HCC) Active Problems:   CAP (community acquired pneumonia)   Pneumonia due to COVID-19 virus   Urinary tract infection   AKI (acute kidney injury) (HCC)   Rhabdomyolysis   Acute metabolic encephalopathy   Anemia   Depression with anxiety   History of stroke  Assessment and Plan: Severe sepsis: met criteria w/ tachycardia and tachypnea with soft blood pressure, AKI, AMS, lactic acidosis with elevated procalcitonin as per Dr. Lucianne Muss. Possibly secondary to COVID19 pneumonia vs bacterial co-infection vs UTI. Severe resolved.   Unlikely bacteremia: 1/4 growing staph, likely containment. Repeat blood cxs NGTD   COVID19 pneumonia: w/ vs. possible co-bacterial infection. Pro-cal is elevated 4.12. Continue on IV azithromycin, rocephin, bronchodilators & encourage incentive spirometry. Repeat CXR shows progressive, extensive right sided infiltrate. Increased oxygen demand overnight. Will expand abx coverage if pt continues require more oxygen or spikes a fever.  Continue on airborne & contact precautions.   Respiratory distress: no documentation of SaO2 below 89%. Continue on supplemental oxygen and wean as tolerated. Likely secondary to pneumonia.    Dysphagia: NPO as per speech.     UTI: hx pseudomonas and ESBL E. coli UTI 11/2021. Urine cx shows no growth. Continue on IV rocephin x 5 days. Abxs likely given before cx were taken   Urinary retention: bladder scan showed 800 mL, Foley catheter inserted on 8/20.    AKI : resolved   Rhabdomyolysis: unknown whether traumatic or not. CK is trending down daily    Acute metabolic encephalopathy: AA&Ox4, not confused this morning. Likely secondary to above infections. CT head shows no acute intracranial findings    Anemia: resolved. H&H are WNL. Possibly dilutional vs lab  error.   Hx of CVA: continue on aspirin, statin    Depression: severity unknown. Continue on home dose of trazodone          DVT prophylaxis: lovenox  Code Status: full  Family Communication: Disposition Plan: likely d/c to SNF   Level of care: Telemetry [facility]:    Procedures:   Antimicrobials: rocephin, azithromycin   Subjective: Pt c/o malaise and wants to drink water  Objective: Vitals:   04/13/23 2200 04/14/23 0230 04/14/23 0340 04/14/23 0817  BP:  (!) 140/81 136/82 120/75  Pulse:  77 75 79  Resp: (!) 25 (!) 36 (!) 30 (!) 35  Temp:  99.1 F (37.3 C) 98.2 F (36.8 C) 99.7 F (37.6 C)  TempSrc:      SpO2: 95% 90% 94% 94%  Weight:      Height:        Intake/Output Summary (Last 24 hours) at 04/14/2023 0825 Last data filed at 04/14/2023 3474 Gross per 24 hour  Intake 250.07 ml  Output 1150 ml  Net -899.93 ml   Filed Weights   04/11/23 1702  Weight: 62.1 kg    Examination:  General exam: Appears frustrated  Respiratory system: course breath sounds b/l   Cardiovascular system: S1/S2+. No rubs or clicks  Gastrointestinal system: Abd is soft, NT, ND & hypoactive bowel sounds  Central nervous system: Alert & oriented x 4. Moves all extremities  Psychiatry: Judgement and insight appears improved. Frustrated mood and affect   Data Reviewed: I have personally reviewed following labs and imaging studies  CBC: Recent Labs  Lab 04/11/23 1704 04/12/23 0104 04/13/23 0418 04/14/23  0215  WBC 8.1  --  6.8 6.0  NEUTROABS 7.0  --   --   --   HGB 7.8* 13.6 12.5 12.3  HCT 23.6*  --  36.9 36.1  MCV 95.2  --  91.8 90.3  PLT 101*  --  120* 136*   Basic Metabolic Panel: Recent Labs  Lab 04/11/23 1704 04/12/23 0104 04/13/23 0418 04/14/23 0215  NA 138 141 140 137  K 4.2 3.7 3.0* 2.7*  CL 105 107 108 106  CO2 21* 24 23 24   GLUCOSE 137* 122* 91 92  BUN 63* 48* 23 16  CREATININE 1.78* 1.15* 0.64 0.51  CALCIUM 8.8* 8.3* 7.5* 7.6*  MG  --    --  2.2 2.1  PHOS  --   --  2.6 2.0*   GFR: Estimated Creatinine Clearance: 50 mL/min (by C-G formula based on SCr of 0.51 mg/dL). Liver Function Tests: Recent Labs  Lab 04/11/23 1704 04/12/23 0104  AST 45* 50*  ALT 23 21  ALKPHOS 63 52  BILITOT 1.2 0.9  PROT 7.6 6.6  ALBUMIN 3.4* 2.9*   No results for input(s): "LIPASE", "AMYLASE" in the last 168 hours. No results for input(s): "AMMONIA" in the last 168 hours. Coagulation Profile: Recent Labs  Lab 04/11/23 1704  INR 1.2   Cardiac Enzymes: Recent Labs  Lab 04/11/23 1717 04/13/23 0418 04/14/23 0215  CKTOTAL 1,438* 1,439* 937*   BNP (last 3 results) No results for input(s): "PROBNP" in the last 8760 hours. HbA1C: No results for input(s): "HGBA1C" in the last 72 hours. CBG: No results for input(s): "GLUCAP" in the last 168 hours. Lipid Profile: No results for input(s): "CHOL", "HDL", "LDLCALC", "TRIG", "CHOLHDL", "LDLDIRECT" in the last 72 hours. Thyroid Function Tests: No results for input(s): "TSH", "T4TOTAL", "FREET4", "T3FREE", "THYROIDAB" in the last 72 hours. Anemia Panel: No results for input(s): "VITAMINB12", "FOLATE", "FERRITIN", "TIBC", "IRON", "RETICCTPCT" in the last 72 hours. Sepsis Labs: Recent Labs  Lab 04/11/23 1700 04/11/23 1717 04/11/23 1918 04/12/23 0104  PROCALCITON  --  7.73  --  4.12  LATICACIDVEN 2.4*  --  2.0*  --     Recent Results (from the past 240 hour(s))  Culture, blood (Routine x 2)     Status: Abnormal   Collection Time: 04/11/23  5:04 PM   Specimen: BLOOD  Result Value Ref Range Status   Specimen Description   Final    BLOOD RIGHT ANTECUBITAL Performed at [facility], [address] [place], [place] [address]    Special Requests   Final    BOTTLES DRAWN AEROBIC AND ANAEROBIC Blood Culture adequate volume Performed at [facility], [address] [place], [place] [address]    Culture  Setup Time   Final    AEROBIC BOTTLE ONLY GRAM POSITIVE  [facility] CRITICAL RESULT CALLED TO, READ BACK BY AND VERIFIED WITH: TREY GREENWOOD 04/12/23 1151 MW    Culture (A)  Final    STAPHYLOCOCCUS HOMINIS THE SIGNIFICANCE OF ISOLATING THIS ORGANISM FROM A SINGLE SET OF BLOOD CULTURES WHEN MULTIPLE SETS ARE DRAWN IS UNCERTAIN. PLEASE NOTIFY THE MICROBIOLOGY DEPARTMENT WITHIN ONE WEEK IF SPECIATION AND SENSITIVITIES ARE REQUIRED. Performed at [facility], [address] [place], [place] [address]    Report Status 04/13/2023 FINAL  Final  Blood Culture ID Panel (Reflexed)     Status: Abnormal   Collection Time: 04/11/23  5:04 PM  Result Value Ref Range Status   Enterococcus faecalis NOT DETECTED NOT DETECTED Final  Enterococcus Faecium NOT DETECTED NOT DETECTED Final   Listeria monocytogenes NOT DETECTED NOT DETECTED Final   Staphylococcus species DETECTED (A) NOT DETECTED Final    Comment: CRITICAL RESULT CALLED TO, READ BACK BY AND VERIFIED WITH: TREY GREENWOOD 04/12/23 1151 MW    Staphylococcus aureus (BCID) NOT DETECTED NOT DETECTED Final   Staphylococcus epidermidis NOT DETECTED NOT DETECTED Final   Staphylococcus lugdunensis NOT DETECTED NOT DETECTED Final   Streptococcus species NOT DETECTED NOT DETECTED Final   Streptococcus agalactiae NOT DETECTED NOT DETECTED Final   Streptococcus pneumoniae NOT DETECTED NOT DETECTED Final   Streptococcus pyogenes NOT DETECTED NOT DETECTED Final   A.calcoaceticus-baumannii NOT DETECTED NOT DETECTED Final   Bacteroides fragilis NOT DETECTED NOT DETECTED Final   Enterobacterales NOT DETECTED NOT DETECTED Final   Enterobacter cloacae complex NOT DETECTED NOT DETECTED Final   Escherichia coli NOT DETECTED NOT DETECTED Final   Klebsiella aerogenes NOT DETECTED NOT DETECTED Final   Klebsiella oxytoca NOT DETECTED NOT DETECTED Final   Klebsiella pneumoniae NOT DETECTED NOT DETECTED Final   Proteus species NOT DETECTED NOT DETECTED Final   Salmonella species NOT DETECTED NOT DETECTED Final   Serratia  marcescens NOT DETECTED NOT DETECTED Final   Haemophilus influenzae NOT DETECTED NOT DETECTED Final   Neisseria meningitidis NOT DETECTED NOT DETECTED Final   Pseudomonas aeruginosa NOT DETECTED NOT DETECTED Final   Stenotrophomonas maltophilia NOT DETECTED NOT DETECTED Final   Candida albicans NOT DETECTED NOT DETECTED Final   Candida auris NOT DETECTED NOT DETECTED Final   Candida glabrata NOT DETECTED NOT DETECTED Final   Candida krusei NOT DETECTED NOT DETECTED Final   Candida parapsilosis NOT DETECTED NOT DETECTED Final   Candida tropicalis NOT DETECTED NOT DETECTED Final   Cryptococcus neoformans/gattii NOT DETECTED NOT DETECTED Final    Comment: Performed at [facility], [address] [place], [place] [address]  SARS Coronavirus 2 by RT PCR (hospital order, performed in [facility] hospital lab) *cepheid single result test* Anterior Nasal Swab     Status: Abnormal   Collection Time: 04/11/23  5:16 PM   Specimen: Anterior Nasal Swab  Result Value Ref Range Status   SARS Coronavirus 2 by RT PCR POSITIVE (A) NEGATIVE Final    Comment: (NOTE) SARS-CoV-2 target nucleic acids are DETECTED  SARS-CoV-2 RNA is generally detectable in upper respiratory specimens  during the acute phase of infection.  Positive results are indicative  of the presence of the identified virus, but do not rule out bacterial infection or co-infection with other pathogens not detected by the test.  Clinical correlation with patient history and  other diagnostic information is necessary to determine patient infection status.  The expected result is negative.  Fact Sheet for Patients:   RoadLapTop.co.za   Fact Sheet for Healthcare Providers:   http://kim-miller.com/    This test is not yet approved or cleared by the [place] FDA and  has been authorized for detection and/or diagnosis of SARS-CoV-2 by FDA under an Emergency Use Authorization (EUA).   This EUA will remain in effect (meaning this test can be used) for the duration of  the COVID-19 declaration under Section 564(b)(1)  of the Act, 21 U.S.C. section 360-bbb-3(b)(1), unless the authorization is terminated or revoked sooner.   Performed at [facility], [address] [place], [place] [address]   Culture, blood (Routine x 2)     Status: None (Preliminary result)   Collection Time: 04/11/23  7:18 PM   Specimen: BLOOD  Result Value Ref Range Status   Specimen Description BLOOD BLOOD RIGHT ARM  Final   Special Requests   Final    BOTTLES DRAWN AEROBIC AND ANAEROBIC Blood Culture adequate volume   Culture   Final    NO GROWTH 3 DAYS Performed at [facility], [address], [place], [place] [address]    Report Status PENDING  Incomplete  Urine Culture (for pregnant, neutropenic or urologic patients or patients with an indwelling urinary catheter)     Status: None   Collection Time: 04/12/23  7:30 PM   Specimen: Urine, Clean Catch  Result Value Ref Range Status   Specimen Description   Final    URINE, CLEAN CATCH Performed at [facility], [address], [place], [place] [address]    Special Requests   Final    NONE Performed at [facility], [address] [place], [place] [address]    Culture   Final    NO GROWTH Performed at [facility], [address], [place], [place] [address]    Report Status 04/13/2023 FINAL  Final  Gastrointestinal Panel by PCR , Stool     Status: None   Collection Time: 04/13/23  8:05 AM   Specimen: Stool  Result Value Ref Range Status   Campylobacter species NOT DETECTED NOT DETECTED Final   Plesimonas shigelloides NOT DETECTED NOT DETECTED Final   Salmonella species NOT DETECTED NOT DETECTED Final   Yersinia enterocolitica NOT DETECTED NOT DETECTED Final   Vibrio species NOT DETECTED NOT DETECTED Final   Vibrio cholerae NOT DETECTED NOT DETECTED Final   Enteroaggregative E coli  (EAEC) NOT DETECTED NOT DETECTED Final   Enteropathogenic E coli (EPEC) NOT DETECTED NOT DETECTED Final   Enterotoxigenic E coli (ETEC) NOT DETECTED NOT DETECTED Final   Shiga like toxin producing E coli (STEC) NOT DETECTED NOT DETECTED Final   Shigella/Enteroinvasive E coli (EIEC) NOT DETECTED NOT DETECTED Final   Cryptosporidium NOT DETECTED NOT DETECTED Final   Cyclospora cayetanensis NOT DETECTED NOT DETECTED Final   Entamoeba histolytica NOT DETECTED NOT DETECTED Final   Giardia lamblia NOT DETECTED NOT DETECTED Final   Adenovirus F40/41 NOT DETECTED NOT DETECTED Final   Astrovirus NOT DETECTED NOT DETECTED Final   Norovirus GI/GII NOT DETECTED NOT DETECTED Final   Rotavirus A NOT DETECTED NOT DETECTED Final   Sapovirus (I, II, IV, and V) NOT DETECTED NOT DETECTED Final    Comment: Performed at [facility], [address] [place], [place] [address]  C Difficile Quick Screen w PCR reflex     Status: None   Collection Time: 04/13/23  8:05 AM   Specimen: STOOL  Result Value Ref Range Status   C Diff antigen NEGATIVE NEGATIVE Final   C Diff toxin NEGATIVE NEGATIVE Final   C Diff interpretation No C. difficile detected.  Final    Comment: Performed at [facility], [address] [place], [place] [address]  Culture, blood (Routine X 2) w Reflex to ID Panel     Status: None (Preliminary result)   Collection Time: 04/13/23  5:11 PM   Specimen: BLOOD  Result Value Ref Range Status   Specimen Description BLOOD BLOOD RIGHT ARM  Final   Special Requests   Final    BOTTLES DRAWN AEROBIC AND ANAEROBIC Blood Culture adequate volume   Culture   Final    NO GROWTH < 24 HOURS Performed at [facility], [address] [place],  [place] [address]    Report Status PENDING  Incomplete  Culture, blood (Routine X 2) w Reflex to ID Panel     Status: None (Preliminary result)   Collection Time: 04/13/23  5:18 PM   Specimen: BLOOD  Result Value Ref Range Status    Specimen Description BLOOD BLOOD LEFT ARM  Final   Special Requests   Final    BOTTLES DRAWN AEROBIC AND ANAEROBIC Blood Culture results may not be optimal due to an inadequate volume of blood received in culture bottles   Culture   Final    NO GROWTH < 24 HOURS Performed at [facility], [address] [place], [place] [address]    Report Status PENDING  Incomplete         Radiology Studies: DG Chest Port 1 View  Result Date: 04/14/2023 CLINICAL DATA:  Dyspnea EXAM: PORTABLE CHEST 1 VIEW COMPARISON:  04/11/2023 FINDINGS: There has developed extensive airspace infiltrate throughout the right lung in keeping with progressive pneumonic infiltrate. Left lung is clear. No pneumothorax or pleural effusion. Cardiac size within normal limits. Pulmonary vascularity is normal. No acute bone abnormality. IMPRESSION: 1. Progressive, extensive right-sided pneumonic infiltrate. Follow-up chest radiograph is recommended in 3-4 weeks to document complete resolution. Electronically Signed   By: Helyn Numbers M.D.   On: 04/14/2023 03:23        Scheduled Meds:  aspirin  81 mg Oral Daily   Chlorhexidine Gluconate Cloth  6 each Topical Daily   donepezil  10 mg Oral QHS   enoxaparin (LOVENOX) injection  40 mg Subcutaneous QPM   guaiFENesin  15 mL Oral TID   saccharomyces boulardii  250 mg Oral BID   Continuous Infusions:  azithromycin Stopped (04/13/23 2128)   cefTRIAXone (ROCEPHIN)  IV 2 g (04/13/23 1536)   potassium chloride 10 mEq (04/14/23 0643)   potassium PHOSPHATE IVPB (in mmol)       LOS: 3 days    Time spent: 35 mins     Charise Killian, MD [facility] Pager [phone]  If 7PM-7AM, please contact night-coverage [email or web address] 04/14/2023, 8:25 AM
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
## 2023-04-14 NOTE — Progress Notes (Signed)
       CROSS COVER NOTE  NAME: Teresa Sherman MRN: [id number] DOB : 1946-01-08    Concern as stated by nurse / staff   patient respiration is back to 36, full minute, also requiring more oxygen now, at 5 lpm satting at 90, she is a yellow mews 3 at the moment, she is alert but disorinted, lungs are diminished.      Pertinent findings on chart review: Last progress note reviewed: Patient here with severe sepsis secondary to COVID-19 pneumonia with possible bacterial coinfection     04/14/2023    2:30 AM 04/13/2023    9:43 PM 04/13/2023    8:33 PM  Vitals with BMI  Systolic 140 137 409  Diastolic 81 85 78  Pulse 77 84 83   .  Patient assessment Physical Exam Vitals and nursing note reviewed.  Constitutional:      General: She is not in acute distress.    Comments: Anxious appearing and tachypneic  HENT:     Head: Normocephalic and atraumatic.  Cardiovascular:     Rate and Rhythm: Normal rate and regular rhythm.     Heart sounds: Normal heart sounds.  Pulmonary:     Effort: Tachypnea present.     Comments: Coarse breath sounds bilaterally Abdominal:     Palpations: Abdomen is soft.     Tenderness: There is no abdominal tenderness.  Neurological:     Mental Status: Mental status is at baseline.      Assessment and  Interventions   Assessment:  Acute respiratory failure secondary to COVID-19 pneumonia with bacterial infection  Plan: ABG and chest x-ray ordered Continue supplemental oxygen to keep sats over 94 X   ABG    Component Value Date/Time   PHART 7.49 (H) 04/14/2023 0400   PCO2ART 34 04/14/2023 0400   PO2ART 64 (L) 04/14/2023 0400   HCO3 25.9 04/14/2023 0400   O2SAT 95.6 04/14/2023 0400   CXR IMPRESSION: 1. Progressive, extensive right-sided pneumonic infiltrate. Follow-up chest radiograph is recommended in 3-4 weeks to document complete resolution.  -Worsening pneumonic infiltrate - Continue current management - Supplemental oxygen to  keep sats over 94%    CRITICAL CARE Performed by: Andris Baumann   Total critical care time: 60 minutes  Critical care time was exclusive of separately billable procedures and treating other patients.  Critical care was necessary to treat or prevent imminent or life-threatening deterioration.  Critical care was time spent personally by me on the following activities: development of treatment plan with patient and/or surrogate as well as nursing, discussions with consultants, evaluation of patient's response to treatment, examination of patient, obtaining history from patient or surrogate, ordering and performing treatments and interventions, ordering and review of laboratory studies, ordering and review of radiographic studies, pulse oximetry and re-evaluation of patient's condition.

## 2023-04-14 NOTE — Progress Notes (Signed)
Patient is tachypneic with respiration up to 36 bpm per NT, this nurse came to evaluate the patient and did a full minute respiration and noted 25 bpm, no wheezing noted but has congested cough. Informed Dr. Doylene Canard about yellow Mews that has since turned green, no new order made and will continue current treatment plan. Will implement aspiration precaution as patient coughs whenever she drinks something, Dr. Para March made aware.    04/13/23 2143 04/13/23 2200  Assess: MEWS Score  Temp 97.6 F (36.4 C)  --   BP 137/85  --   MAP (mmHg) 102  --   Pulse Rate 84  --   Resp (!) 34 (Notified Aaralyn Kil, RN) (!) 25  SpO2 95 % 95 %  O2 Device Nasal Cannula Nasal Cannula  O2 Flow Rate (L/min)  --  3 L/min  Assess: MEWS Score  MEWS Temp 0 0  MEWS Systolic 0 0  MEWS Pulse 0 0  MEWS RR 2 1  MEWS LOC 0 0  MEWS Score 2 1  MEWS Score Color Yellow Green  Assess: if the MEWS score is Yellow or Red  Were vital signs accurate and taken at a resting state? No, vital signs rechecked  --   Does the patient meet 2 or more of the SIRS criteria? No  --   Notify: Charge Nurse/RN  Name of Charge Nurse/RN Occupational psychologist RN  --   Provider Notification  Provider Name/Title Dr Para March  --   Date Provider Notified 04/13/23  --   Time Provider Notified 2214  --   Method of Notification Page  --   Notification Reason  (tachypnea)  --   Provider response No new orders  --   Date of Provider Response 04/13/23  --   Time of Provider Response 2221  --   Assess: SIRS CRITERIA  SIRS Temperature  0 0  SIRS Pulse 0 0  SIRS Respirations  1 1  SIRS WBC 0 0  SIRS Score Sum  1 1

## 2023-04-14 NOTE — Progress Notes (Signed)
Physical Therapy Treatment Patient Details Name: Teresa Sherman MRN: [id number] DOB: 1945-10-18 Today's Date: 04/14/2023   History of Present Illness Teresa Sherman is a 77 y.o. female with medical history significant for Prior stroke, depression with anxiety, prior ESBL E. coli, who presents to the emergency room by EMS with altered mental status.  She was found after wellness check was requested after she had not been seen in 2 days. Admitted to hospital wiith sepsis and COVID + with concerns for UTI.    PT Comments  Patient continues to be limited by confusion and fatigue. Seen in conjunction with OT to maximize patient tolerance and attempt progression, however unable to progress due to limited O2 line and IV line threaded through bed rail. Required modA for bed mobility and minA+2 sit to stand with HHAx2. Able to ambulate fwd/bwd to sink with min-modA+2 and HHAx2. Tolerated standing at sink for ~10 minutes. On 6L O2 throughout with spO2 >90%. Discharge plan remains appropriate.    If plan is discharge home, recommend the following: A lot of help with walking and/or transfers;A lot of help with bathing/dressing/bathroom;Assistance with cooking/housework;Direct supervision/assist for medications management;Direct supervision/assist for financial management;Assist for transportation;Help with stairs or ramp for entrance;Supervision due to cognitive status   Can travel by private vehicle     No  Equipment Recommendations  Other (comment) (TBD at next venue of care)    Recommendations for Other Services       Precautions / Restrictions Precautions Precautions: Fall Restrictions Weight Bearing Restrictions: No     Mobility  Bed Mobility Overal bed mobility: Needs Assistance Bed Mobility: Supine to Sit, Sit to Supine     Supine to sit: Mod assist Sit to supine: Mod assist, +2 for safety/equipment        Transfers Overall transfer level: Needs assistance Equipment  used: 2 person hand held assist Transfers: Sit to/from Stand Sit to Stand: Min assist, +2 physical assistance, +2 safety/equipment           General transfer comment: use of momentum to come into standing.Encouragement provided to complete. Assist required for steadying    Ambulation/Gait Ambulation/Gait assistance: Min assist, Mod assist, +2 safety/equipment Gait Distance (Feet): 3 Feet (+3) Assistive device: 2 person hand held assist Gait Pattern/deviations: Step-to pattern Gait velocity: decreased     General Gait Details: 3' fwd/3' bwd to sink. Limited by IV line threaded through bed rail and O2 hooked to wall, notified RN. Required assist for balance and cueing   Stairs             Wheelchair Mobility     Tilt Bed    Modified Rankin (Stroke Patients Only)       Balance Overall balance assessment: Needs assistance Sitting-balance support: No upper extremity supported, Feet supported Sitting balance-Leahy Scale: Fair     Standing balance support: Bilateral upper extremity supported Standing balance-Leahy Scale: Poor                              Cognition Arousal: Alert Behavior During Therapy: Lability Overall Cognitive Status: Impaired/Different from baseline Area of Impairment: Orientation, Following commands, Safety/judgement, Awareness                 Orientation Level: Disoriented to, Time     Following Commands: Follows one step commands inconsistently, Follows one step commands with increased time Safety/Judgement: Decreased awareness of safety, Decreased awareness of deficits Awareness: Emergent  General Comments: Perseverative of "my daughter died" throughout session. Able to be redirected at times.        Exercises      General Comments        Pertinent Vitals/Pain Pain Assessment Pain Assessment: No/denies pain    Home Living                          Prior Function            PT Goals  (current goals can now be found in the care plan section) Acute Rehab PT Goals Patient Stated Goal: did not state PT Goal Formulation: Patient unable to participate in goal setting Time For Goal Achievement: 04/26/23 Potential to Achieve Goals: Fair Progress towards PT goals: Progressing toward goals    Frequency    Min 1X/week      PT Plan      Co-evaluation PT/OT/SLP Co-Evaluation/Treatment: Yes Reason for Co-Treatment: Necessary to address cognition/behavior during functional activity;To address functional/ADL transfers PT goals addressed during session: Mobility/safety with mobility        AM-PAC PT "6 Clicks" Mobility   Outcome Measure  Help needed turning from your back to your side while in a flat bed without using bedrails?: A Lot Help needed moving from lying on your back to sitting on the side of a flat bed without using bedrails?: A Lot Help needed moving to and from a bed to a chair (including a wheelchair)?: Total Help needed standing up from a chair using your arms (e.g., wheelchair or bedside chair)?: Total Help needed to walk in hospital room?: Total Help needed climbing 3-5 steps with a railing? : Total 6 Click Score: 8    End of Session Equipment Utilized During Treatment: Oxygen Activity Tolerance: Patient tolerated treatment well Patient left: in bed;with call bell/phone within reach;with bed alarm set Nurse Communication: Mobility status PT Visit Diagnosis: Unsteadiness on feet (R26.81);Muscle weakness (generalized) (M62.81);Other abnormalities of gait and mobility (R26.89);Difficulty in walking, not elsewhere classified (R26.2)     Time: 0272-5366 PT Time Calculation (min) (ACUTE ONLY): 27 min  Charges:    $Therapeutic Activity: 8-22 mins PT General Charges $$ ACUTE PT VISIT: 1 Visit                     Maylon Peppers, PT, DPT Physical Therapist - [facility]  [facility]    Charelle Petrakis A Bernadett Milian 04/14/2023, 3:41  PM

## 2023-04-15 ENCOUNTER — Inpatient Hospital Stay: Payer: Medicare HMO

## 2023-04-15 DIAGNOSIS — U071 COVID-19: Secondary | ICD-10-CM | POA: Diagnosis not present

## 2023-04-15 DIAGNOSIS — J1282 Pneumonia due to coronavirus disease 2019: Secondary | ICD-10-CM | POA: Diagnosis not present

## 2023-04-15 LAB — BASIC METABOLIC PANEL
Anion gap: 14 (ref 5–15)
Anion gap: 10 (ref 5–15)
BUN: 13 mg/dL (ref 8–23)
BUN: 15 mg/dL (ref 8–23)
CO2: 24 mmol/L (ref 22–32)
CO2: 24 mmol/L (ref 22–32)
Calcium: 7.7 mg/dL — ABNORMAL LOW (ref 8.9–10.3)
Calcium: 7.2 mg/dL — ABNORMAL LOW (ref 8.9–10.3)
Chloride: 101 mmol/L (ref 98–111)
Chloride: 99 mmol/L (ref 98–111)
Creatinine, Ser: 0.52 mg/dL (ref 0.44–1.00)
Creatinine, Ser: 0.61 mg/dL (ref 0.44–1.00)
GFR, Estimated: >60 mL/min (ref >60)
GFR, Estimated: >60 mL/min (ref >60)
Glucose, Bld: 83 mg/dL (ref 70–99)
Glucose, Bld: 143 mg/dL — ABNORMAL HIGH (ref 70–99)
Potassium: 3.6 mmol/L (ref 3.5–5.1)
Potassium: 3.4 mmol/L — ABNORMAL LOW (ref 3.5–5.1)
Sodium: 139 mmol/L (ref 135–145)
Sodium: 133 mmol/L — ABNORMAL LOW (ref 135–145)

## 2023-04-15 LAB — CBC WITH DIFFERENTIAL/PLATELET
Abs Immature Granulocytes: 0.08 10*3/uL — ABNORMAL HIGH (ref 0.00–0.07)
Basophils Absolute: 0 10*3/uL (ref 0.0–0.1)
Basophils Relative: 0 %
Eosinophils Absolute: 0 10*3/uL (ref 0.0–0.5)
Eosinophils Relative: 0 %
HCT: 32.5 % — ABNORMAL LOW (ref 36.0–46.0)
Hemoglobin: 11.4 g/dL — ABNORMAL LOW (ref 12.0–15.0)
Immature Granulocytes: 1 %
Lymphocytes Relative: 9 %
Lymphs Abs: 0.6 10*3/uL — ABNORMAL LOW (ref 0.7–4.0)
MCH: 31.1 pg (ref 26.0–34.0)
MCHC: 35.1 g/dL (ref 30.0–36.0)
MCV: 88.8 fL (ref 80.0–100.0)
Monocytes Absolute: 0.5 10*3/uL (ref 0.1–1.0)
Monocytes Relative: 6 %
Neutro Abs: 6 10*3/uL (ref 1.7–7.7)
Neutrophils Relative %: 84 %
Platelets: 177 10*3/uL (ref 150–400)
RBC: 3.66 MIL/uL — ABNORMAL LOW (ref 3.87–5.11)
RDW: 13.8 % (ref 11.5–15.5)
WBC: 7.2 10*3/uL (ref 4.0–10.5)
nRBC: 0 % (ref 0.0–0.2)

## 2023-04-15 LAB — CBC
HCT: 33.6 % — ABNORMAL LOW (ref 36.0–46.0)
Hemoglobin: 11.8 g/dL — ABNORMAL LOW (ref 12.0–15.0)
MCH: 31 pg (ref 26.0–34.0)
MCHC: 35.1 g/dL (ref 30.0–36.0)
MCV: 88.2 fL (ref 80.0–100.0)
Platelets: 154 10*3/uL (ref 150–400)
RBC: 3.81 MIL/uL — ABNORMAL LOW (ref 3.87–5.11)
RDW: 13.8 % (ref 11.5–15.5)
WBC: 5.9 10*3/uL (ref 4.0–10.5)
nRBC: 0 % (ref 0.0–0.2)

## 2023-04-15 LAB — TROPONIN I (HIGH SENSITIVITY): Troponin I (High Sensitivity): 3734 ng/L (ref <18)

## 2023-04-15 LAB — D-DIMER, QUANTITATIVE: D-Dimer, Quant: 3.42 ug{FEU}/mL — ABNORMAL HIGH (ref 0.00–0.50)

## 2023-04-15 LAB — PHOSPHORUS: Phosphorus: 2.5 mg/dL (ref 2.5–4.6)

## 2023-04-15 LAB — CK: Total CK: 416 U/L — ABNORMAL HIGH (ref 38–234)

## 2023-04-15 LAB — PROCALCITONIN: Procalcitonin: 2.25 ng/mL

## 2023-04-15 LAB — MAGNESIUM: Magnesium: 1.8 mg/dL (ref 1.7–2.4)

## 2023-04-15 MED ORDER — MAGNESIUM SULFATE 2 GM/50ML IV SOLN
2.0000 g | Freq: Once | INTRAVENOUS | Status: AC
  Administered 2023-04-15: 2 g via INTRAVENOUS
  Filled 2023-04-15: qty 50

## 2023-04-15 MED ORDER — POTASSIUM CHLORIDE 10 MEQ/100ML IV SOLN
10.0000 meq | INTRAVENOUS | Status: AC
  Administered 2023-04-15 (×4): 10 meq via INTRAVENOUS
  Filled 2023-04-15: qty 100

## 2023-04-15 MED ORDER — ADULT MULTIVITAMIN W/MINERALS CH
1.0000 | ORAL_TABLET | Freq: Every day | ORAL | Status: DC
  Administered 2023-04-16 – 2023-04-26 (×11): 1 via ORAL
  Filled 2023-04-15 (×13): qty 1

## 2023-04-15 MED ORDER — LACTATED RINGERS IV BOLUS
250.0000 mL | Freq: Once | INTRAVENOUS | Status: AC
  Administered 2023-04-15: 250 mL via INTRAVENOUS

## 2023-04-15 MED ORDER — METHYLPREDNISOLONE SODIUM SUCC 125 MG IJ SOLR
125.0000 mg | INTRAMUSCULAR | Status: AC
  Administered 2023-04-15: 125 mg via INTRAVENOUS
  Filled 2023-04-15: qty 2

## 2023-04-15 MED ORDER — KATE FARMS STANDARD 1.4 PO LIQD
325.0000 mL | Freq: Three times a day (TID) | ORAL | Status: DC
  Administered 2023-04-17 – 2023-04-20 (×5): 325 mL via ORAL
  Filled 2023-04-15: qty 325

## 2023-04-15 MED ORDER — CALCIUM GLUCONATE-NACL 1-0.675 GM/50ML-% IV SOLN
1.0000 g | Freq: Once | INTRAVENOUS | Status: AC
  Administered 2023-04-16: 1000 mg via INTRAVENOUS
  Filled 2023-04-15: qty 50

## 2023-04-15 MED ORDER — MIDODRINE HCL 5 MG PO TABS
10.0000 mg | ORAL_TABLET | Freq: Once | ORAL | Status: AC
  Administered 2023-04-15: 10 mg via ORAL
  Filled 2023-04-15: qty 2

## 2023-04-15 MED ORDER — ALBUMIN HUMAN 25 % IV SOLN
25.0000 g | Freq: Once | INTRAVENOUS | Status: AC
  Administered 2023-04-16: 25 g via INTRAVENOUS
  Filled 2023-04-15: qty 100

## 2023-04-15 MED ORDER — METHYLPREDNISOLONE SODIUM SUCC 40 MG IJ SOLR
40.0000 mg | Freq: Two times a day (BID) | INTRAMUSCULAR | Status: DC
  Administered 2023-04-16 – 2023-04-22 (×13): 40 mg via INTRAVENOUS
  Filled 2023-04-15 (×13): qty 1

## 2023-04-15 MED ORDER — HEPARIN (PORCINE) 25000 UT/250ML-% IV SOLN
750.0000 [IU]/h | INTRAVENOUS | Status: AC
  Administered 2023-04-16 – 2023-04-17 (×2): 750 [IU]/h via INTRAVENOUS
  Filled 2023-04-15 (×2): qty 250

## 2023-04-15 MED ORDER — SODIUM CHLORIDE 0.9 % IV BOLUS
500.0000 mL | Freq: Once | INTRAVENOUS | Status: AC
  Administered 2023-04-15: 500 mL via INTRAVENOUS

## 2023-04-15 MED ORDER — POTASSIUM CHLORIDE CRYS ER 20 MEQ PO TBCR
40.0000 meq | EXTENDED_RELEASE_TABLET | Freq: Once | ORAL | Status: AC
  Administered 2023-04-16: 40 meq via ORAL
  Filled 2023-04-15: qty 2

## 2023-04-15 MED ORDER — METOPROLOL TARTRATE 5 MG/5ML IV SOLN
5.0000 mg | INTRAVENOUS | Status: AC
  Administered 2023-04-15: 5 mg via INTRAVENOUS
  Filled 2023-04-15: qty 5

## 2023-04-15 NOTE — Plan of Care (Signed)
  Problem: Fluid Volume: Goal: Hemodynamic stability will improve Outcome: Progressing   Problem: Clinical Measurements: Goal: Diagnostic test results will improve Outcome: Progressing Goal: Signs and symptoms of infection will decrease Outcome: Progressing   Problem: Respiratory: Goal: Ability to maintain adequate ventilation will improve Outcome: Progressing   Problem: Education: Goal: Knowledge of risk factors and measures for prevention of condition will improve Outcome: Progressing   Problem: Coping: Goal: Psychosocial and spiritual needs will be supported Outcome: Progressing   Problem: Respiratory: Goal: Will maintain a patent airway Outcome: Progressing Goal: Complications related to the disease process, condition or treatment will be avoided or minimized Outcome: Progressing   Problem: Education: Goal: Knowledge of General Education information will improve Description: Including pain rating scale, medication(s)/side effects and non-pharmacologic comfort measures Outcome: Progressing   Problem: Health Behavior/Discharge Planning: Goal: Ability to manage health-related needs will improve Outcome: Progressing   Problem: Clinical Measurements: Goal: Ability to maintain clinical measurements within normal limits will improve Outcome: Progressing Goal: Will remain free from infection Outcome: Progressing Goal: Diagnostic test results will improve Outcome: Progressing Goal: Respiratory complications will improve Outcome: Progressing Goal: Cardiovascular complication will be avoided Outcome: Progressing   Problem: Activity: Goal: Risk for activity intolerance will decrease Outcome: Progressing   Problem: Nutrition: Goal: Adequate nutrition will be maintained Outcome: Progressing   Problem: Coping: Goal: Level of anxiety will decrease Outcome: Progressing   Problem: Elimination: Goal: Will not experience complications related to bowel motility Outcome:  Progressing Goal: Will not experience complications related to urinary retention Outcome: Progressing   Problem: Pain Managment: Goal: General experience of comfort will improve Outcome: Progressing   Problem: Safety: Goal: Ability to remain free from injury will improve Outcome: Progressing   Problem: Skin Integrity: Goal: Risk for impaired skin integrity will decrease Outcome: Progressing

## 2023-04-15 NOTE — Plan of Care (Signed)
  Problem: Fluid Volume: Goal: Hemodynamic stability will improve Outcome: Progressing   Problem: Clinical Measurements: Goal: Signs and symptoms of infection will decrease Outcome: Progressing   Problem: Clinical Measurements: Goal: Will remain free from infection Outcome: Progressing Goal: Diagnostic test results will improve Outcome: Progressing Goal: Respiratory complications will improve Outcome: Progressing   Problem: Coping: Goal: Level of anxiety will decrease Outcome: Progressing   Problem: Pain Managment: Goal: General experience of comfort will improve Outcome: Progressing   Problem: Safety: Goal: Ability to remain free from injury will improve Outcome: Progressing

## 2023-04-15 NOTE — Progress Notes (Signed)
PROGRESS NOTE    Teresa Sherman  ZOX:[id number] DOB: 06-17-1946 DOA: 04/11/2023 [facility]    Assessment & Plan:   Principal Problem:   Severe sepsis (HCC) Active Problems:   CAP (community acquired pneumonia)   Pneumonia due to COVID-19 virus   Urinary tract infection   AKI (acute kidney injury) (HCC)   Rhabdomyolysis   Acute metabolic encephalopathy   Anemia   Depression with anxiety   History of stroke  Assessment and Plan: Severe sepsis: met criteria w/ tachycardia and tachypnea with soft blood pressure, AKI, AMS, lactic acidosis with elevated procalcitonin as per Dr. Lucianne Muss. Possibly secondary to COVID19 pneumonia vs bacterial co-infection vs UTI. Severe resolved.   Unlikely bacteremia: 1/4 growing staph, likely containment. Repeat blood cxs NGTD  COVID19 pneumonia: w/ vs. possible co-bacterial infection. Pro-cal is elevated 4.12. Continue on IV ceftriaxone, azithromycin, bronchodilators & encourage incentive spirometry. Repeat CXR shows progressive, extensive right sided infiltrate. Increased oxygen demand overnight. Will expand abx coverage if pt continues require more oxygen or spikes a fever.  Continue on airborne & contact precautions.   Respiratory distress: no documentation of SaO2 below 89%. Likely secondary to pneumonia. Continue on supplemental oxygen and wean as tolerated   R/o Dysphagia: started back on regular diet 04/15/23 as per speech     UTI: hx pseudomonas and ESBL E. coli UTI 11/2021. Urine cx shows no growth. Continue on IV rocephin x 5 days, Abxs likely given before cx were taken   Urinary retention: bladder scan showed 800 mL, Foley catheter inserted on 8/20. Will do a voiding trial prior to d/c    AKI : resolved   Rhabdomyolysis: unknown whether traumatic or not. CK continues to trend down    Acute metabolic encephalopathy: likely secondary to above infections. CT head shows no acute intracranial findings. Mental status close to  baseline today    Anemia: resolved. Possibly dilutional vs lab error.   Hx of CVA: continue on statin, aspirin    Depression: severity unknown. Continue on home dose of trazodone          DVT prophylaxis: lovenox  Code Status: full  Family Communication: Disposition Plan: likely d/c to SNF   Level of care: Telemetry Surgical Consultants:    Procedures:   Antimicrobials: rocephin, azithromycin   Subjective: Pt c/o being thirsty   Objective: Vitals:   04/14/23 1543 04/14/23 1949 04/15/23 0043 04/15/23 0440  BP: 123/85 119/84 110/72 (!) 132/98  Pulse: 74 71 66 71  Resp: (!) 30 18 16 16   Temp: 98.1 F (36.7 C) 97.9 F (36.6 C) 98 F (36.7 C) 97.7 F (36.5 C)  TempSrc:   Oral Oral  SpO2: 96% 98% 94% 96%  Weight:      Height:        Intake/Output Summary (Last 24 hours) at 04/15/2023 0816 Last data filed at 04/15/2023 0440 Gross per 24 hour  Intake 1139.69 ml  Output 2350 ml  Net -1210.31 ml   Filed Weights   04/11/23 1702  Weight: 62.1 kg    Examination:  General exam: appears comfortable  Respiratory system: decreased breath sounds b/l  Cardiovascular system: S1 & S2+. No rubs or clicks  Gastrointestinal system: abd is soft, NT, ND & normal bowel sounds Central nervous system: AA&Ox4. Moves all extremities   Psychiatry: Judgement and insight appears improved. Flat mood and affect   Data Reviewed: I have personally reviewed following labs and imaging studies  CBC: Recent Labs  Lab 04/11/23 1704 04/12/23  0104 04/13/23 0418 04/14/23 0215 04/15/23 0540  WBC 8.1  --  6.8 6.0 5.9  NEUTROABS 7.0  --   --   --   --   HGB 7.8* 13.6 12.5 12.3 11.8*  HCT 23.6*  --  36.9 36.1 33.6*  MCV 95.2  --  91.8 90.3 88.2  PLT 101*  --  120* 136* 154   Basic Metabolic Panel: Recent Labs  Lab 04/11/23 1704 04/12/23 0104 04/13/23 0418 04/14/23 0215 04/14/23 2034 04/15/23 0540  NA 138 141 140 137  --  139  K 4.2 3.7 3.0* 2.7* 3.0* 3.6  CL 105 107 108  106  --  101  CO2 21* 24 23 24   --  24  GLUCOSE 137* 122* 91 92  --  83  BUN 63* 48* 23 16  --  13  CREATININE 1.78* 1.15* 0.64 0.51  --  0.52  CALCIUM 8.8* 8.3* 7.5* 7.6*  --  7.7*  MG  --   --  2.2 2.1  --  1.8  PHOS  --   --  2.6 2.0*  --  2.5   GFR: Estimated Creatinine Clearance: 50 mL/min (by C-G formula based on SCr of 0.52 mg/dL). Liver Function Tests: Recent Labs  Lab 04/11/23 1704 04/12/23 0104  AST 45* 50*  ALT 23 21  ALKPHOS 63 52  BILITOT 1.2 0.9  PROT 7.6 6.6  ALBUMIN 3.4* 2.9*   No results for input(s): "LIPASE", "AMYLASE" in the last 168 hours. No results for input(s): "AMMONIA" in the last 168 hours. Coagulation Profile: Recent Labs  Lab 04/11/23 1704  INR 1.2   Cardiac Enzymes: Recent Labs  Lab 04/11/23 1717 04/13/23 0418 04/14/23 0215 04/15/23 0540  CKTOTAL 1,438* 1,439* 937* 416*   BNP (last 3 results) No results for input(s): "PROBNP" in the last 8760 hours. HbA1C: No results for input(s): "HGBA1C" in the last 72 hours. CBG: No results for input(s): "GLUCAP" in the last 168 hours. Lipid Profile: No results for input(s): "CHOL", "HDL", "LDLCALC", "TRIG", "CHOLHDL", "LDLDIRECT" in the last 72 hours. Thyroid Function Tests: No results for input(s): "TSH", "T4TOTAL", "FREET4", "T3FREE", "THYROIDAB" in the last 72 hours. Anemia Panel: No results for input(s): "VITAMINB12", "FOLATE", "FERRITIN", "TIBC", "IRON", "RETICCTPCT" in the last 72 hours. Sepsis Labs: Recent Labs  Lab 04/11/23 1700 04/11/23 1717 04/11/23 1918 04/12/23 0104  PROCALCITON  --  7.73  --  4.12  LATICACIDVEN 2.4*  --  2.0*  --     Recent Results (from the past 240 hour(s))  Culture, blood (Routine x 2)     Status: Abnormal   Collection Time: 04/11/23  5:04 PM   Specimen: BLOOD  Result Value Ref Range Status   Specimen Description   Final    BLOOD RIGHT ANTECUBITAL Performed at [facility], [address], [place], [place] [address]    Special Requests    Final    BOTTLES DRAWN AEROBIC AND ANAEROBIC Blood Culture adequate volume Performed at [facility], [address] [place], [place] [address]    Culture  Setup Time   Final    AEROBIC BOTTLE ONLY GRAM POSITIVE COCCI CRITICAL RESULT CALLED TO, READ BACK BY AND VERIFIED WITH: TREY GREENWOOD 04/12/23 1151 MW    Culture (A)  Final    STAPHYLOCOCCUS HOMINIS THE SIGNIFICANCE OF ISOLATING THIS ORGANISM FROM A SINGLE SET OF BLOOD CULTURES WHEN MULTIPLE SETS ARE DRAWN IS UNCERTAIN. PLEASE NOTIFY THE MICROBIOLOGY DEPARTMENT WITHIN ONE WEEK IF SPECIATION AND SENSITIVITIES  ARE REQUIRED. Performed at [facility], [address], [place], [place] [address]    Report Status 04/13/2023 FINAL  Final  Blood Culture ID Panel (Reflexed)     Status: Abnormal   Collection Time: 04/11/23  5:04 PM  Result Value Ref Range Status   Enterococcus faecalis NOT DETECTED NOT DETECTED Final   Enterococcus Faecium NOT DETECTED NOT DETECTED Final   Listeria monocytogenes NOT DETECTED NOT DETECTED Final   Staphylococcus species DETECTED (A) NOT DETECTED Final    Comment: CRITICAL RESULT CALLED TO, READ BACK BY AND VERIFIED WITH: TREY GREENWOOD 04/12/23 1151 MW    Staphylococcus aureus (BCID) NOT DETECTED NOT DETECTED Final   Staphylococcus epidermidis NOT DETECTED NOT DETECTED Final   Staphylococcus lugdunensis NOT DETECTED NOT DETECTED Final   Streptococcus species NOT DETECTED NOT DETECTED Final   Streptococcus agalactiae NOT DETECTED NOT DETECTED Final   Streptococcus pneumoniae NOT DETECTED NOT DETECTED Final   Streptococcus pyogenes NOT DETECTED NOT DETECTED Final   A.calcoaceticus-baumannii NOT DETECTED NOT DETECTED Final   Bacteroides fragilis NOT DETECTED NOT DETECTED Final   Enterobacterales NOT DETECTED NOT DETECTED Final   Enterobacter cloacae complex NOT DETECTED NOT DETECTED Final   Escherichia coli NOT DETECTED NOT DETECTED Final   Klebsiella aerogenes NOT DETECTED NOT DETECTED  Final   Klebsiella oxytoca NOT DETECTED NOT DETECTED Final   Klebsiella pneumoniae NOT DETECTED NOT DETECTED Final   Proteus species NOT DETECTED NOT DETECTED Final   Salmonella species NOT DETECTED NOT DETECTED Final   Serratia marcescens NOT DETECTED NOT DETECTED Final   Haemophilus influenzae NOT DETECTED NOT DETECTED Final   Neisseria meningitidis NOT DETECTED NOT DETECTED Final   Pseudomonas aeruginosa NOT DETECTED NOT DETECTED Final   Stenotrophomonas maltophilia NOT DETECTED NOT DETECTED Final   Candida albicans NOT DETECTED NOT DETECTED Final   Candida auris NOT DETECTED NOT DETECTED Final   Candida glabrata NOT DETECTED NOT DETECTED Final   Candida krusei NOT DETECTED NOT DETECTED Final   Candida parapsilosis NOT DETECTED NOT DETECTED Final   Candida tropicalis NOT DETECTED NOT DETECTED Final   Cryptococcus neoformans/gattii NOT DETECTED NOT DETECTED Final    Comment: Performed at [facility], [address] [place], [place] [address]  SARS Coronavirus 2 by RT PCR (hospital order, performed in [facility] hospital lab) *cepheid single result test* Anterior Nasal Swab     Status: Abnormal   Collection Time: 04/11/23  5:16 PM   Specimen: Anterior Nasal Swab  Result Value Ref Range Status   SARS Coronavirus 2 by RT PCR POSITIVE (A) NEGATIVE Final    Comment: (NOTE) SARS-CoV-2 target nucleic acids are DETECTED  SARS-CoV-2 RNA is generally detectable in upper respiratory specimens  during the acute phase of infection.  Positive results are indicative  of the presence of the identified virus, but do not rule out bacterial infection or co-infection with other pathogens not detected by the test.  Clinical correlation with patient history and  other diagnostic information is necessary to determine patient infection status.  The expected result is negative.  Fact Sheet for Patients:   RoadLapTop.co.za   Fact Sheet for Healthcare Providers:    http://kim-miller.com/    This test is not yet approved or cleared by the [place] FDA and  has been authorized for detection and/or diagnosis of SARS-CoV-2 by FDA under an Emergency Use Authorization (EUA).  This EUA will remain in effect (meaning this test can be used) for the duration of  the COVID-19  declaration under Section 564(b)(1)  of the Act, 21 U.S.C. section 360-bbb-3(b)(1), unless the authorization is terminated or revoked sooner.   Performed at [facility], [address] [place], [place] [address]   Culture, blood (Routine x 2)     Status: None (Preliminary result)   Collection Time: 04/11/23  7:18 PM   Specimen: BLOOD  Result Value Ref Range Status   Specimen Description BLOOD BLOOD RIGHT ARM  Final   Special Requests   Final    BOTTLES DRAWN AEROBIC AND ANAEROBIC Blood Culture adequate volume   Culture   Final    NO GROWTH 4 DAYS Performed at [facility], [address], [place], [place] [address]    Report Status PENDING  Incomplete  Urine Culture (for pregnant, neutropenic or urologic patients or patients with an indwelling urinary catheter)     Status: None   Collection Time: 04/12/23  7:30 PM   Specimen: Urine, Clean Catch  Result Value Ref Range Status   Specimen Description   Final    URINE, CLEAN CATCH Performed at [facility], [address] [place], [place] [address]    Special Requests   Final    NONE Performed at [facility], [address] [place], [place] [address]    Culture   Final    NO GROWTH Performed at [facility], [address] [place], [place] [address]    Report Status 04/13/2023 FINAL  Final  Gastrointestinal Panel by PCR , Stool     Status: None   Collection Time: 04/13/23  8:05 AM   Specimen: Stool  Result Value Ref Range Status   Campylobacter species NOT DETECTED NOT DETECTED Final   Plesimonas shigelloides NOT DETECTED NOT DETECTED Final    Salmonella species NOT DETECTED NOT DETECTED Final   Yersinia enterocolitica NOT DETECTED NOT DETECTED Final   Vibrio species NOT DETECTED NOT DETECTED Final   Vibrio cholerae NOT DETECTED NOT DETECTED Final   Enteroaggregative E coli (EAEC) NOT DETECTED NOT DETECTED Final   Enteropathogenic E coli (EPEC) NOT DETECTED NOT DETECTED Final   Enterotoxigenic E coli (ETEC) NOT DETECTED NOT DETECTED Final   Shiga like toxin producing E coli (STEC) NOT DETECTED NOT DETECTED Final   Shigella/Enteroinvasive E coli (EIEC) NOT DETECTED NOT DETECTED Final   Cryptosporidium NOT DETECTED NOT DETECTED Final   Cyclospora cayetanensis NOT DETECTED NOT DETECTED Final   Entamoeba histolytica NOT DETECTED NOT DETECTED Final   Giardia lamblia NOT DETECTED NOT DETECTED Final   Adenovirus F40/41 NOT DETECTED NOT DETECTED Final   Astrovirus NOT DETECTED NOT DETECTED Final   Norovirus GI/GII NOT DETECTED NOT DETECTED Final   Rotavirus A NOT DETECTED NOT DETECTED Final   Sapovirus (I, II, IV, and V) NOT DETECTED NOT DETECTED Final    Comment: Performed at [facility], [address] [place], [place] [address]  C Difficile Quick Screen w PCR reflex     Status: None   Collection Time: 04/13/23  8:05 AM   Specimen: STOOL  Result Value Ref Range Status   C Diff antigen NEGATIVE NEGATIVE Final   C Diff toxin NEGATIVE NEGATIVE Final   C Diff interpretation No C. difficile detected.  Final    Comment: Performed at [facility], [address] [place], [place] [address]  Culture, blood (Routine X 2) w Reflex to ID Panel     Status: None (Preliminary result)   Collection Time: 04/13/23  5:11 PM  Specimen: BLOOD  Result Value Ref Range Status   Specimen Description BLOOD BLOOD RIGHT ARM  Final   Special Requests   Final    BOTTLES DRAWN AEROBIC AND ANAEROBIC Blood Culture adequate volume   Culture   Final    NO GROWTH 2 DAYS Performed at [facility], [address],  [place], [place] [address]    Report Status PENDING  Incomplete  Culture, blood (Routine X 2) w Reflex to ID Panel     Status: None (Preliminary result)   Collection Time: 04/13/23  5:18 PM   Specimen: BLOOD  Result Value Ref Range Status   Specimen Description BLOOD BLOOD LEFT ARM  Final   Special Requests   Final    BOTTLES DRAWN AEROBIC AND ANAEROBIC Blood Culture results may not be optimal due to an inadequate volume of blood received in culture bottles   Culture   Final    NO GROWTH 2 DAYS Performed at [facility], [address] [place], [place] [address]    Report Status PENDING  Incomplete         Radiology Studies: DG Chest Port 1 View  Result Date: 04/14/2023 CLINICAL DATA:  Dyspnea EXAM: PORTABLE CHEST 1 VIEW COMPARISON:  04/11/2023 FINDINGS: There has developed extensive airspace infiltrate throughout the right lung in keeping with progressive pneumonic infiltrate. Left lung is clear. No pneumothorax or pleural effusion. Cardiac size within normal limits. Pulmonary vascularity is normal. No acute bone abnormality. IMPRESSION: 1. Progressive, extensive right-sided pneumonic infiltrate. Follow-up chest radiograph is recommended in 3-4 weeks to document complete resolution. Electronically Signed   By: Helyn Numbers M.D.   On: 04/14/2023 03:23        Scheduled Meds:  aspirin  81 mg Oral Daily   Chlorhexidine Gluconate Cloth  6 each Topical Daily   donepezil  10 mg Oral QHS   enoxaparin (LOVENOX) injection  40 mg Subcutaneous QPM   guaiFENesin  15 mL Oral TID   saccharomyces boulardii  250 mg Oral BID   Continuous Infusions:  azithromycin 500 mg (04/14/23 2119)   cefTRIAXone (ROCEPHIN)  IV 2 g (04/14/23 1733)   magnesium sulfate bolus IVPB       LOS: 4 days    Time spent: 25 mins     Charise Killian, MD [facility] Pager [phone]  If 7PM-7AM, please contact night-coverage [email or web address] 04/15/2023, 8:16 AM

## 2023-04-15 NOTE — Progress Notes (Signed)
Initial Nutrition Assessment  DOCUMENTATION CODES:   Not applicable  INTERVENTION:   [employer] 1.4 PO TID, each supplement provides 455 kcal and 20 grams protein -MVI with minerals daily  NUTRITION DIAGNOSIS:   Increased nutrient needs related to acute illness (COVID-19) as evidenced by estimated needs.  GOAL:   Patient will meet greater than or equal to 90% of their needs  MONITOR:   PO intake, Supplement acceptance  REASON FOR ASSESSMENT:   Consult Assessment of nutrition requirement/status  ASSESSMENT:   Pt with medical history significant for Prior stroke, depression with anxiety, prior ESBL E. coli, who presents with altered mental status.  Pt admitted with severe sepsis and COVID-19 pneumonia.   8/23- s/p BSE- advanced to regular diet  Reviewed I/O's: -1.2 L x 24 hours and +378 since admission  UOP: 2.3 L x 24 hours  Spoke with pt at bedside, who complains of back pain and states she is hungry. RD assisted pt in setting up meal tray and opening packages for her. She reports she has not eaten since admission and this is her first meal. RD reoriented pt and discussed SLP recommendations (SLP consulted RD for food preferences). PTA pt reports good appetite, consuming 2-3 meals per day. Pt states that she prefers to eat "organic" food. When RD asked pt to further elaborate, she became very tangential. Favorite and commonly consumed foods include eggs, potatoes, broccoli, and fruit. RD obtained food preferences and placed in meal ordering system. Pt requested eggs, coffee, and juice for breakfast.   Reviewed wt hx; wt has been stable over the past year.   Discussed importance of good meal and supplement intake to promote healing. RD will trial Molli Posey supplement due to preference for organic food.    Medications reviewed and include florastor.   Labs reviewed.    NUTRITION - FOCUSED PHYSICAL EXAM:  Flowsheet Row Most Recent Value  Orbital Region No depletion   Upper Arm Region Mild depletion  Thoracic and Lumbar Region No depletion  Buccal Region No depletion  Temple Region No depletion  Clavicle Bone Region No depletion  Clavicle and Acromion Bone Region No depletion  Scapular Bone Region No depletion  Dorsal Hand Mild depletion  Patellar Region No depletion  Anterior Thigh Region No depletion  Posterior Calf Region No depletion  Edema (RD Assessment) Mild  Hair Reviewed  Eyes Reviewed  Mouth Reviewed  Skin Reviewed  Nails Reviewed       Diet Order:   Diet Order             Diet regular Room service appropriate? Yes with Assist; Fluid consistency: Thin  Diet effective now                   EDUCATION NEEDS:   Education needs have been addressed  Skin:  Skin Assessment: Skin Integrity Issues: Skin Integrity Issues:: DTI DTI: coccyx  Last BM:  04/15/23 (type 7)  Height:   Ht Readings from Last 1 Encounters:  04/11/23 5' (1.524 m)    Weight:   Wt Readings from Last 1 Encounters:  04/11/23 62.1 kg    Ideal Body Weight:  45.5 kg  BMI:  Body mass index is 26.74 kg/m.  Estimated Nutritional Needs:   Kcal:  1650-1850  Protein:  80-95 grams  Fluid:  > 1.6 L    Levada Schilling, RD, LDN, CDCES Registered Dietitian II Certified Diabetes Care and Education Specialist Please refer to [facility] for RD and/or RD on-call/weekend/after hours  pager

## 2023-04-15 NOTE — Progress Notes (Signed)
During nurse intentional rounding patient was laying in bed stating that she was freezing cold. During that time telemetry called and stated that the patient heart rate was trending in the 140's. Nurse called for assistance from nurse assistant. Patient vitals were taken and her temp ws 101.9 and her heart rhythm was ST. Nurse administered tylenol per rectum and the MD was notified. Stat orders for lopressor were ordered and administered. The nurse monitored patients vital signs until patient was stable. MD was notified of stable vital signs. Patient is stable and able to make needs known.

## 2023-04-15 NOTE — TOC Progression Note (Signed)
Transition of Care [facility]) - Progression Note    Patient Details  Name: Teresa Sherman MRN: [id number] Date of Birth: 21-Sep-1945  [facility]) CM/SW Contact  Marlowe Sax, RN Phone Number: 04/15/2023, 12:33 PM  Clinical Narrative:     The patient remains on Oxygen now at 6 liters, Bedsearch was sent, once she is more medically stable will recheck to ensure the STR bed is still available,       Expected Discharge Plan and Services                                               Social Determinants of Health (SDOH) Interventions SDOH Screenings   Food Insecurity: No Food Insecurity (04/13/2023)  Housing: Low Risk  (04/13/2023)  Transportation Needs: No Transportation Needs (04/13/2023)  Utilities: Not At Risk (04/13/2023)  Financial Resource Strain: Low Risk  (06/14/2021)   Received from [facility], [facility]  Physical Activity: Sufficiently Active (10/30/2021)   Received from [facility]  Social Connections: Socially Isolated (10/30/2021)   [facility] from [facility]  Stress: No Stress Concern Present (10/30/2021)   Received from [facility]  Tobacco Use: Low Risk  (04/11/2023)  Health Literacy: Medium Risk (10/30/2021)   Received from [facility]    Readmission Risk Interventions     No data to display

## 2023-04-15 NOTE — Progress Notes (Signed)
       CROSS COVER NOTE  NAME: Teresa Sherman MRN: [id number] DOB : [birth year]    Concern as stated by nurse / staff   S: Pt's VS: 65/57 (62) O2 91% on 7L Hi Flow  B: Pt admitted with Sepsis d/t UTI, COVID, and CA-PNA vs Aspiration PNA. Of note, given 5mL of IV Lopressor at 1750 d/t HR of 140s.  A: Pt alert, no complaints of pain. Able to deep breathe and cough. R: Please advise.      Pertinent findings on chart review:   Assessment and  Interventions   Assessment:    04/15/2023    9:17 PM 04/15/2023    8:58 PM 04/15/2023    6:01 PM  Vitals with BMI  Systolic 65 61 103  Diastolic 57 48 77  Pulse 84 89 97   Bedside patient in no acute distress  Respirations unlabored. Diminished bases, sporadic course crackles more in right lung. 6 L HFNC Chest xray - reviewed by me. Rad report multifocal pneumonia EKG - reviewed by me SR with prolonged Qtc 538 D dimer 3.42 Troponin - 3743 to Pro cal -  2.25 down from initial 7.73 CTA chest IMPRESSION: No evidence of pulmonary emboli.  Bilateral effusions with patchy airspace opacities right greater than left.  Old compression deformities as described.  Plan: Sepsis from Covid 19 infection and multifocal pneumonia Cultures redrawn 48 hours ago therefore will defer repeat this time Rocephin and azithromycin 5 day treatment regimen complete Start unasyn Solumedrol 125 IV x 1 now f/b 40 BID BP improved after fluids and albumin Acute on chronic resp failure Titrate oxygen keep sats above 88  Elevated troponin Heparin drip per pharmacy consult Echo ordered.  Defer to day team for cardiology cosult  Transfer to ICU  CRITICAL CARE Performed by: Manuela Schwartz   Total critical care time: 45 minutes  Critical care time was exclusive of separately billable procedures and treating other patients.  Critical care was necessary to treat or prevent imminent or life-threatening deterioration.  Critical care was time spent  personally by me on the following activities: development of treatment plan with patient and/or surrogate as well as nursing, discussions with consultants, evaluation of patient's response to treatment, examination of patient, obtaining history from patient or surrogate, ordering and performing treatments and interventions, ordering and review of laboratory studies, ordering and review of radiographic studies, pulse oximetry and re-evaluation of patient's condition.         Donnie Mesa NP [facility] Cross Cover 7pm-7am - check amion for availability Pager [phone]

## 2023-04-15 NOTE — Progress Notes (Signed)
PHARMACY CONSULT NOTE - ELECTROLYTES  Pharmacy Consult for Electrolyte Monitoring and Replacement   Recent Labs: Height: 5' (152.4 cm) Weight: 62.1 kg (136 lb 14.4 oz) IBW/kg (Calculated) : 45.5 Estimated Creatinine Clearance: 50 mL/min (by C-G formula based on SCr of 0.52 mg/dL). Potassium (mmol/L)  Date Value  04/15/2023 3.6  11/26/2013 3.8   Magnesium (mg/dL)  Date Value  82/95/6213 1.8   Calcium (mg/dL)  Date Value  08/65/7846 7.7 (L)   Calcium, Total (mg/dL)  Date Value  96/29/5284 9.5   Albumin (g/dL)  Date Value  13/24/4010 2.9 (L)  04/30/2015 4.3  11/26/2013 3.9   Phosphorus (mg/dL)  Date Value  27/25/3664 2.5   Sodium (mmol/L)  Date Value  04/15/2023 139  04/30/2015 140  11/26/2013 142   Corrected Ca: 8.6 mg/dL  Assessment  Teresa Sherman is a 77 y.o. female presenting with Severe sepsis secondary to pneumonia . PMH significant for CVA, Anemia, and depression. Pharmacy has been consulted to monitor and replace electrolytes.  Diet: NPO  Goal of Therapy: Electrolytes WNL  Plan:  No additional potassium supplementation is warranted at this time Will give Mag sulfate 2 g IV x 1 dose Check BMP and Mag with AM labs  Thank you for involving pharmacy in this patient's care.   Paulita Fujita, PharmD Clinical Pharmacist 04/15/2023 7:29 AM

## 2023-04-15 NOTE — Progress Notes (Addendum)
Speech Language Pathology Treatment: Dysphagia  Patient Details Name: Teresa Sherman MRN: [id number] DOB: 1945-12-01 Today's Date: 04/15/2023 Time: 4098-1191 SLP Time Calculation (min) (ACUTE ONLY): 40 min  Assessment / Plan / Recommendation Clinical Impression  Pt seen today for ongoing assessment of swallowing. Pt awake, appeared weak but w/ much improved effort/volume of speech. WOB appeared minimal during positioning upright in bed - improved from yesterday. Pt was verbal and able to answer basic questions; follow 1 setup commands. Tangential at times. Per MD, "acute metabolic encephalopathy at admit likely secondary to COVID, infections. CT head shows no acute intracranial findings. Mental status close to baseline today". Pt on HFNC O2 support of 6L; afebrile; WBC WNL.  OF NOTE: MRI in 2023: "Mild to moderate chronic microvascular ischemic disease.".   Pt appears to present w/ functional oropharyngeal phase swallow w/ No oropharyngeal phase dysphagia noted, No neuromuscular deficits noted w/ po trials. Pt consumed po trials w/ No overt, clinical s/s of aspiration during trials. Pt took her time w/ po trials - encouraged to take rest breaks as needed to avoid any WOB from exertion of tasks. Pt appears at reduced risk for aspiration following general aspiration precautions.   However, pt does have challenging factors that could impact her oropharyngeal swallowing to include fatigue/weakness, deconditioning, declined Pulmonary status from COVID infection and need for O2 support. These factors can increase risk for aspiration, dysphagia as well as decreased oral intake overall.   During po trials, pt consumed all consistencies w/ no overt coughing, decline in vocal quality, or change in respiratory presentation during/post trials. O2 sats 95-97% when checked. Oral phase appeared [facility] w/ timely bolus management, mastication, and control of bolus propulsion for A-P transfer for swallowing. Oral  clearing achieved w/ all trial consistencies -- moistened, soft foods given.  Pt fed self w/ setup support.   Due to pt's limited preferences/choices of foods (Dietician consult placed d/t pt's preferences/limitations), recommend a fairly Regular consistency diet w/ well-Cut meats, moistened foods; Thin liquids -- Cup drinking only, No Straws. Recommend general aspiration precautions including Rest Breaks as needed during meals for conservation of energy. Pills WHOLE vs Crushed in Puree for safer, easier swallowing - encouraged now and for D/C to the pt.  Education given on Pills in Puree; food consistencies and easy to eat options; general aspiration precautions to pt. ST services will f/u w/ po check, education tomorrow w/ signing off if no further skilled needs. NSG/MD updated, agreed.      HPI HPI: Pt is a 77 y.o. female with medical history significant for prior stroke, depression with anxiety, prior ESBL E. coli, who presents to the emergency room by EMS with altered mental status.  She was found after wellness check was requested after she had not been seen in 2 days.  She was found somnolent and confused in her bed and had unintelligible mumbling but without focal neurologic deficits.  She is reportedly oriented x 3 at baseline.  She was also noted by EMS to have foul-smelling urine ED course and data review:  COVID-positive.   Per her history, she was previously diagnosed with Mild Cognitive Impairment(MCI) but is able to speak in function with minimal issues.  She does occasionally display emotional lability and say inappropriate things at times however these have been ongoing issues.   CXR this admit: There has developed extensive airspace infiltrate throughout the  right lung in keeping with progressive pneumonic infiltrate. Left  lung is clear. No pneumothorax or pleural effusion.  Cardiac size  within normal limits. Pulmonary vascularity is normal.      SLP Plan  All goals met       Recommendations for follow up therapy are one component of a multi-disciplinary discharge planning process, led by the attending physician.  Recommendations may be updated based on patient status, additional functional criteria and insurance authorization.    Recommendations  Diet recommendations: Dysphagia 3 (mechanical soft);Regular;Thin liquid (cut meats, moistened foods) Liquids provided via: Cup;No straw Medication Administration: Whole meds with puree Supervision: Patient able to self feed;Intermittent supervision to cue for compensatory strategies (setup) Compensations: Minimize environmental distractions;Slow rate;Small sips/bites;Lingual sweep for clearance of pocketing;Follow solids with liquid Postural Changes and/or Swallow Maneuvers: Out of bed for meals;Seated upright 90 degrees;Upright 30-60 min after meal                 (Dietician consult for food choices) Oral care BID;Oral care before and after PO;Staff/trained caregiver to provide oral care (support)   Intermittent Supervision/Assistance (Cognition) Dysphagia, unspecified (R13.10) (COVID+)     All goals met      Teresa Som, MS, CCC-SLP Speech Language Pathologist Rehab Services; [facility] [phone] (ascom) Teresa Sherman  04/15/2023, 7:46 PM

## 2023-04-16 ENCOUNTER — Inpatient Hospital Stay: Payer: Medicare HMO

## 2023-04-16 DIAGNOSIS — I214 Non-ST elevation (NSTEMI) myocardial infarction: Secondary | ICD-10-CM

## 2023-04-16 DIAGNOSIS — U071 COVID-19: Secondary | ICD-10-CM | POA: Diagnosis not present

## 2023-04-16 DIAGNOSIS — J1282 Pneumonia due to coronavirus disease 2019: Secondary | ICD-10-CM | POA: Diagnosis not present

## 2023-04-16 LAB — CULTURE, BLOOD (ROUTINE X 2)
Culture: NO GROWTH
Special Requests: ADEQUATE

## 2023-04-16 LAB — BLOOD GAS, VENOUS
Acid-Base Excess: 2.1 mmol/L — ABNORMAL HIGH (ref 0.0–2.0)
Bicarbonate: 25.5 mmol/L (ref 20.0–28.0)
O2 Saturation: 100 %
Patient temperature: 37
pCO2, Ven: 35 mmHg — ABNORMAL LOW (ref 44–60)
pH, Ven: 7.47 — ABNORMAL HIGH (ref 7.25–7.43)
pO2, Ven: 122 mmHg — ABNORMAL HIGH (ref 32–45)

## 2023-04-16 LAB — CBC
HCT: 34.7 % — ABNORMAL LOW (ref 36.0–46.0)
Hemoglobin: 11.8 g/dL — ABNORMAL LOW (ref 12.0–15.0)
MCH: 30.5 pg (ref 26.0–34.0)
MCHC: 34 g/dL (ref 30.0–36.0)
MCV: 89.7 fL (ref 80.0–100.0)
Platelets: 191 10*3/uL (ref 150–400)
RBC: 3.87 MIL/uL (ref 3.87–5.11)
RDW: 13.9 % (ref 11.5–15.5)
WBC: 6.1 10*3/uL (ref 4.0–10.5)
nRBC: 0 % (ref 0.0–0.2)

## 2023-04-16 LAB — HEPARIN LEVEL (UNFRACTIONATED)
Heparin Unfractionated: 0.55 [IU]/mL (ref 0.30–0.70)
Heparin Unfractionated: 0.39 [IU]/mL (ref 0.30–0.70)

## 2023-04-16 LAB — BASIC METABOLIC PANEL
Anion gap: 14 (ref 5–15)
BUN: 13 mg/dL (ref 8–23)
CO2: 21 mmol/L — ABNORMAL LOW (ref 22–32)
Calcium: 7.8 mg/dL — ABNORMAL LOW (ref 8.9–10.3)
Chloride: 104 mmol/L (ref 98–111)
Creatinine, Ser: 0.54 mg/dL (ref 0.44–1.00)
GFR, Estimated: >60 mL/min (ref >60)
Glucose, Bld: 138 mg/dL — ABNORMAL HIGH (ref 70–99)
Potassium: 3.4 mmol/L — ABNORMAL LOW (ref 3.5–5.1)
Sodium: 139 mmol/L (ref 135–145)

## 2023-04-16 LAB — GLUCOSE, CAPILLARY
Glucose-Capillary: 136 mg/dL — ABNORMAL HIGH (ref 70–99)
Glucose-Capillary: 144 mg/dL — ABNORMAL HIGH (ref 70–99)

## 2023-04-16 LAB — TROPONIN I (HIGH SENSITIVITY): Troponin I (High Sensitivity): 2814 ng/L (ref <18)

## 2023-04-16 LAB — MRSA NEXT GEN BY PCR, NASAL: MRSA by PCR Next Gen: NOT DETECTED

## 2023-04-16 LAB — MAGNESIUM: Magnesium: 1.9 mg/dL (ref 1.7–2.4)

## 2023-04-16 MED ORDER — POTASSIUM CHLORIDE CRYS ER 20 MEQ PO TBCR
40.0000 meq | EXTENDED_RELEASE_TABLET | Freq: Once | ORAL | Status: AC
  Administered 2023-04-16: 40 meq via ORAL
  Filled 2023-04-16: qty 2

## 2023-04-16 MED ORDER — ORAL CARE MOUTH RINSE
15.0000 mL | OROMUCOSAL | Status: DC
  Administered 2023-04-16: 15 mL via OROMUCOSAL

## 2023-04-16 MED ORDER — ORAL CARE MOUTH RINSE: 15.0000 mL | OROMUCOSAL | Status: DC | PRN

## 2023-04-16 MED ORDER — IOHEXOL 350 MG/ML SOLN
75.0000 mL | Freq: Once | INTRAVENOUS | Status: AC | PRN
  Administered 2023-04-16: 75 mL via INTRAVENOUS

## 2023-04-16 MED ORDER — ROSUVASTATIN CALCIUM 10 MG PO TABS
40.0000 mg | ORAL_TABLET | Freq: Every day | ORAL | Status: DC
  Filled 2023-04-16: qty 4

## 2023-04-16 MED ORDER — SODIUM CHLORIDE 0.9 % IV SOLN
3.0000 g | Freq: Four times a day (QID) | INTRAVENOUS | Status: DC
  Administered 2023-04-16 – 2023-04-18 (×10): 3 g via INTRAVENOUS
  Filled 2023-04-16 (×12): qty 8

## 2023-04-16 MED ORDER — ROSUVASTATIN CALCIUM 10 MG PO TABS
40.0000 mg | ORAL_TABLET | Freq: Every day | ORAL | Status: DC
  Administered 2023-04-16 – 2023-04-25 (×10): 40 mg via ORAL
  Filled 2023-04-16 (×11): qty 4

## 2023-04-16 NOTE — Progress Notes (Signed)
ANTICOAGULATION CONSULT NOTE  Pharmacy Consult for Heparin Infusion Indication: chest pain/ACS  Allergies  Allergen Reactions   Codeine Nausea And Vomiting    migraine   Duloxetine Hcl Nausea And Vomiting   Hydroxyzine     Patient reports severe dizziness and N/V after taking Hydroxyzine 25 mg. 24 hours later feels some better    Monosodium Glutamate     migraine   Nitrates, Organic     migraine   Phosphorus     migraine   Sodium Benzoate [Nutritional Supplements]     migraine   Sodium Phosphates     migraine   Zithromax [Azithromycin]     Migraine- Tolerated 5 doses 03/2023     Patient Measurements: Height: 5' (152.4 cm) Weight: 58.6 kg (129 lb 3 oz) IBW/kg (Calculated) : 45.5 Heparin Dosing Weight: 62.1 kg  Vital Signs: Temp: 96.6 F (35.9 C) (08/24 0400) Temp Source: Axillary (08/24 0400) BP: 96/70 (08/24 0700) Pulse Rate: 79 (08/24 0700)  Labs: Recent Labs    04/14/23 0215 04/15/23 0540 04/15/23 2244 04/16/23 0117 04/16/23 0435  HGB 12.3 11.8* 11.4*  --  11.8*  HCT 36.1 33.6* 32.5*  --  34.7*  PLT 136* 154 177  --  191  CREATININE 0.51 0.52 0.61  --  0.54  CKTOTAL 937* 416*  --   --   --   TROPONINIHS  --   --  3,734* 2,814*  --     Estimated Creatinine Clearance: 48.6 mL/min (by C-G formula based on SCr of 0.54 mg/dL).   Medical History: Past Medical History:  Diagnosis Date   Allergy    Anemia 04/11/2023   Anxiety    Cataract    Combined fat and carbohydrate induced hyperlipemia 01/26/2015   Depression    GERD (gastroesophageal reflux disease)    Assessment: Patient is a 77yo female admitted for sepsis d/t Covid. Now with elevated troponins. Pharmacy consulted for Heparin dosing for ACS/STEMI.  Goal of Therapy:  Heparin level 0.3-0.7 units/ml Monitor platelets by anticoagulation protocol: Yes   Plan: heparin level therapeutic ---continue heparin at 750 units/hr ----recheck anti-Xa level in 8 hours ----Daily CBC while on Heparin  infusion.   04/16/2023 7:29 AM

## 2023-04-16 NOTE — Plan of Care (Signed)
  Problem: Fluid Volume: Goal: Hemodynamic stability will improve Outcome: Progressing   Problem: Clinical Measurements: Goal: Diagnostic test results will improve Outcome: Progressing Goal: Signs and symptoms of infection will decrease Outcome: Progressing   Problem: Respiratory: Goal: Ability to maintain adequate ventilation will improve Outcome: Progressing   Problem: Education: Goal: Knowledge of risk factors and measures for prevention of condition will improve Outcome: Progressing   Problem: Coping: Goal: Psychosocial and spiritual needs will be supported Outcome: Progressing   Problem: Respiratory: Goal: Will maintain a patent airway Outcome: Progressing Goal: Complications related to the disease process, condition or treatment will be avoided or minimized Outcome: Progressing   Problem: Education: Goal: Knowledge of General Education information will improve Description: Including pain rating scale, medication(s)/side effects and non-pharmacologic comfort measures Outcome: Progressing   Problem: Health Behavior/Discharge Planning: Goal: Ability to manage health-related needs will improve Outcome: Progressing   Problem: Clinical Measurements: Goal: Ability to maintain clinical measurements within normal limits will improve Outcome: Progressing Goal: Will remain free from infection Outcome: Progressing Goal: Diagnostic test results will improve Outcome: Progressing Goal: Respiratory complications will improve Outcome: Progressing Goal: Cardiovascular complication will be avoided Outcome: Progressing   Problem: Activity: Goal: Risk for activity intolerance will decrease Outcome: Progressing   Problem: Nutrition: Goal: Adequate nutrition will be maintained Outcome: Progressing   Problem: Coping: Goal: Level of anxiety will decrease Outcome: Progressing   Problem: Elimination: Goal: Will not experience complications related to bowel motility Outcome:  Progressing Goal: Will not experience complications related to urinary retention Outcome: Progressing   Problem: Pain Managment: Goal: General experience of comfort will improve Outcome: Progressing   Problem: Safety: Goal: Ability to remain free from injury will improve Outcome: Progressing   Problem: Skin Integrity: Goal: Risk for impaired skin integrity will decrease Outcome: Progressing

## 2023-04-16 NOTE — Plan of Care (Signed)
Patient BP improved after giving remainder of bolus ordered and albumin. Heparin infusing at ordered rate. Patient O2 sats drop when sleeping, intermittently down to low 80s (?Sleep apnea). Ultrasound of BLE completed. Echo pending. 6L HFNC, can be weaned while awake. UOP marginal, since CCU admission. Patient coughing up thick, bloody sputum this morning. NP notified. Incontinent of BM x 2.   Problem: Fluid Volume: Goal: Hemodynamic stability will improve Outcome: Not Progressing   Problem: Clinical Measurements: Goal: Diagnostic test results will improve Outcome: Not Progressing Goal: Signs and symptoms of infection will decrease Outcome: Not Progressing   Problem: Respiratory: Goal: Ability to maintain adequate ventilation will improve Outcome: Not Progressing   Problem: Education: Goal: Knowledge of risk factors and measures for prevention of condition will improve Outcome: Progressing   Problem: Coping: Goal: Psychosocial and spiritual needs will be supported Outcome: Progressing   Problem: Respiratory: Goal: Will maintain a patent airway Outcome: Not Progressing Goal: Complications related to the disease process, condition or treatment will be avoided or minimized Outcome: Not Progressing   Problem: Education: Goal: Knowledge of General Education information will improve Description: Including pain rating scale, medication(s)/side effects and non-pharmacologic comfort measures Outcome: Progressing   Problem: Health Behavior/Discharge Planning: Goal: Ability to manage health-related needs will improve Outcome: Progressing   Problem: Clinical Measurements: Goal: Ability to maintain clinical measurements within normal limits will improve Outcome: Progressing Goal: Will remain free from infection Outcome: Progressing Goal: Diagnostic test results will improve Outcome: Progressing Goal: Respiratory complications will improve Outcome: Progressing Goal:  Cardiovascular complication will be avoided Outcome: Progressing   Problem: Activity: Goal: Risk for activity intolerance will decrease Outcome: Progressing   Problem: Nutrition: Goal: Adequate nutrition will be maintained Outcome: Progressing   Problem: Coping: Goal: Level of anxiety will decrease Outcome: Progressing   Problem: Elimination: Goal: Will not experience complications related to bowel motility Outcome: Progressing Goal: Will not experience complications related to urinary retention Outcome: Progressing   Problem: Pain Managment: Goal: General experience of comfort will improve Outcome: Progressing   Problem: Safety: Goal: Ability to remain free from injury will improve Outcome: Progressing   Problem: Skin Integrity: Goal: Risk for impaired skin integrity will decrease Outcome: Progressing

## 2023-04-16 NOTE — Progress Notes (Signed)
ANTICOAGULATION CONSULT NOTE  Pharmacy Consult for Heparin Infusion Indication: chest pain/ACS  Allergies  Allergen Reactions   Codeine Nausea And Vomiting    migraine   Duloxetine Hcl Nausea And Vomiting   Hydroxyzine     Patient reports severe dizziness and N/V after taking Hydroxyzine 25 mg. 24 hours later feels some better    Monosodium Glutamate     migraine   Nitrates, Organic     migraine   Phosphorus     migraine   Sodium Benzoate [Nutritional Supplements]     migraine   Sodium Phosphates     migraine   Zithromax [Azithromycin]     Migraine- Tolerated 5 doses 03/2023     Patient Measurements: Height: 5' (152.4 cm) Weight: 58.6 kg (129 lb 3 oz) IBW/kg (Calculated) : 45.5 Heparin Dosing Weight: 62.1 kg  Vital Signs: Temp: 97.5 F (36.4 C) (08/24 1600) Temp Source: Oral (08/24 1600) BP: 105/79 (08/24 1800) Pulse Rate: 88 (08/24 1800)  Labs: Recent Labs    04/14/23 0215 04/15/23 0540 04/15/23 2244 04/16/23 0117 04/16/23 0435 04/16/23 0903 04/16/23 1732  HGB 12.3 11.8* 11.4*  --  11.8*  --   --   HCT 36.1 33.6* 32.5*  --  34.7*  --   --   PLT 136* 154 177  --  191  --   --   HEPARINUNFRC  --   --   --   --   --  0.55 0.39  CREATININE 0.51 0.52 0.61  --  0.54  --   --   CKTOTAL 937* 416*  --   --   --   --   --   TROPONINIHS  --   --  3,734* 2,814*  --   --   --     Estimated Creatinine Clearance: 48.6 mL/min (by C-G formula based on SCr of 0.54 mg/dL).   Medical History: Past Medical History:  Diagnosis Date   Allergy    Anemia 04/11/2023   Anxiety    Cataract    Combined fat and carbohydrate induced hyperlipemia 01/26/2015   Depression    GERD (gastroesophageal reflux disease)    Assessment: Patient is a 77yo female admitted for sepsis d/t Covid. Now with elevated troponins. Pharmacy consulted for Heparin dosing for ACS/STEMI.  Goal of Therapy:  Heparin level 0.3-0.7 units/ml Monitor platelets by anticoagulation protocol: Yes.  08/24  0903   HL 0.55, therapeutic x1 08/24 1732   HL 0.39, therapeutic x2   Hgb 11.8, plt WNL. No issues with infusion reported, no signs/symptoms of bleeding noted.   Plan: Continue heparin infusion at 750 units/hour Monitor daily Anti-Xa levels while on heparin infusion Monitor CBC and signs/symptoms of bleeding  Thank you for involving pharmacy in this patient's care.   Rockwell Alexandria, PharmD Clinical Pharmacist 04/16/2023 6:11 PM

## 2023-04-16 NOTE — Progress Notes (Signed)
PHARMACY CONSULT NOTE - ELECTROLYTES  Pharmacy Consult for Electrolyte Monitoring and Replacement   Recent Labs: Height: 5' (152.4 cm) Weight: 58.6 kg (129 lb 3 oz) IBW/kg (Calculated) : 45.5 Estimated Creatinine Clearance: 48.6 mL/min (by C-G formula based on SCr of 0.54 mg/dL). Potassium (mmol/L)  Date Value  04/16/2023 3.4 (L)  11/26/2013 3.8   Magnesium (mg/dL)  Date Value  40/98/1191 1.9   Calcium (mg/dL)  Date Value  47/82/9562 7.8 (L)   Calcium, Total (mg/dL)  Date Value  13/03/6577 9.5   Albumin (g/dL)  Date Value  46/96/2952 2.9 (L)  04/30/2015 4.3  11/26/2013 3.9   Phosphorus (mg/dL)  Date Value  84/13/2440 2.5   Sodium (mmol/L)  Date Value  04/16/2023 139  04/30/2015 140  11/26/2013 142   Corrected Ca: 8.6 mg/dL  Assessment  Teresa Sherman is a 77 y.o. female presenting with Severe sepsis secondary to pneumonia . PMH significant for CVA, Anemia, and depression. Pharmacy has been consulted to monitor and replace electrolytes.  Goal of Therapy: Electrolytes WNL  Plan:  40 mEq po KCl x 1 Check BMP and Mag with AM labs  Thank you for involving pharmacy in this patient's care.   Burnis Medin, PharmD, BCPS Clinical Pharmacist 04/16/2023 7:29 AM

## 2023-04-16 NOTE — Progress Notes (Signed)
Spoke with pt's daughter this morning and with the pt's brother this evening. The pt is upset that she cannot find her necklace and gown that she had on when she was brought to the hospital. This RN looked in the pt's room and did not find a necklace of clothing. The admission documentation mentions "clothing" and a "cellphone", but does not mention a necklace. The pt's brother was made aware of this.

## 2023-04-16 NOTE — Progress Notes (Signed)
Pharmacy Antibiotic Note  Teresa Sherman is a 77 y.o. female admitted on 04/11/2023 with sepsis, possible aspiration PNA.  Pharmacy has been consulted for Unasyn dosing.  Plan: Unasyn 3 gm IV Q6H ordered to start on 8/24 @ 0230.  Height: 5' (152.4 cm) Weight: 62.1 kg (136 lb 14.4 oz) IBW/kg (Calculated) : 45.5  Temp (24hrs), Avg:98.4 F (36.9 C), Min:96.2 F (35.7 C), Max:101.9 F (38.8 C)  Recent Labs  Lab 04/11/23 1700 04/11/23 1704 04/11/23 1704 04/11/23 1918 04/12/23 0104 04/13/23 0418 04/14/23 0215 04/15/23 0540 04/15/23 2244  WBC  --  8.1  --   --   --  6.8 6.0 5.9 7.2  CREATININE  --  1.78*   < >  --  1.15* 0.64 0.51 0.52 0.61  LATICACIDVEN 2.4*  --   --  2.0*  --   --   --   --   --    < > = values in this interval not displayed.    Estimated Creatinine Clearance: 50 mL/min (by C-G formula based on SCr of 0.61 mg/dL).    Allergies  Allergen Reactions   Codeine Nausea And Vomiting    migraine   Duloxetine Hcl Nausea And Vomiting   Hydroxyzine     Patient reports severe dizziness and N/V after taking Hydroxyzine 25 mg. 24 hours later feels some better    Monosodium Glutamate     migraine   Nitrates, Organic     migraine   Phosphorus     migraine   Sodium Benzoate [Nutritional Supplements]     migraine   Sodium Phosphates     migraine   Zithromax [Azithromycin]     Migraine- Tolerated 5 doses 03/2023     Antimicrobials this admission:   >>    >>   Dose adjustments this admission:   Microbiology results:  BCx:   UCx:    Sputum:    MRSA PCR:   Thank you for allowing pharmacy to be a part of this patient's care.  Shalice Woodring D 04/16/2023 1:43 AM

## 2023-04-16 NOTE — Progress Notes (Signed)
ANTICOAGULATION CONSULT NOTE - Initial Consult  Pharmacy Consult for Heparin Infusion Indication: chest pain/ACS  Allergies  Allergen Reactions   Codeine Nausea And Vomiting    migraine   Duloxetine Hcl Nausea And Vomiting   Hydroxyzine     Patient reports severe dizziness and N/V after taking Hydroxyzine 25 mg. 24 hours later feels some better    Monosodium Glutamate     migraine   Nitrates, Organic     migraine   Phosphorus     migraine   Sodium Benzoate [Nutritional Supplements]     migraine   Sodium Phosphates     migraine   Zithromax [Azithromycin]     Migraine- Tolerated 5 doses 03/2023     Patient Measurements: Height: 5' (152.4 cm) Weight: 62.1 kg (136 lb 14.4 oz) IBW/kg (Calculated) : 45.5 Heparin Dosing Weight: 62.1 kg  Vital Signs: Temp: 98 F (36.7 C) (08/23 2058) Temp Source: Oral (08/23 1248) BP: 63/49 (08/23 2327) Pulse Rate: 77 (08/23 2327)  Labs: Recent Labs    04/13/23 0418 04/14/23 0215 04/15/23 0540 04/15/23 2244  HGB 12.5 12.3 11.8* 11.4*  HCT 36.9 36.1 33.6* 32.5*  PLT 120* 136* 154 177  CREATININE 0.64 0.51 0.52 0.61  CKTOTAL 1,439* 937* 416*  --   TROPONINIHS  --   --   --  3,734*    Estimated Creatinine Clearance: 50 mL/min (by C-G formula based on SCr of 0.61 mg/dL).   Medical History: Past Medical History:  Diagnosis Date   Allergy    Anemia 04/11/2023   Anxiety    Cataract    Combined fat and carbohydrate induced hyperlipemia 01/26/2015   Depression    GERD (gastroesophageal reflux disease)    Assessment: Patient is a 77yo female admitted for sepsis d/t Covid. Now with elevated troponins. Pharmacy consulted for Heparin dosing for ACS/STEMI.  Goal of Therapy:  Heparin level 0.3-0.7 units/ml Monitor platelets by anticoagulation protocol: Yes   Plan:  Heparin 750 units/hr, no bolus as patient received Enoxaprin 40mg  SQ at 18:00. Check anti-Xa level in 8 hours Daily CBC while on Heparin infusion.  Clovia Cuff,  PharmD, BCPS 04/16/2023 12:03 AM
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
## 2023-04-16 NOTE — Plan of Care (Signed)
  Problem: Fluid Volume: Goal: Hemodynamic stability will improve Outcome: Progressing   Problem: Clinical Measurements: Goal: Diagnostic test results will improve Outcome: Progressing Goal: Signs and symptoms of infection will decrease Outcome: Progressing   Problem: Respiratory: Goal: Ability to maintain adequate ventilation will improve Outcome: Progressing   Problem: Education: Goal: Knowledge of risk factors and measures for prevention of condition will improve Outcome: Progressing   Problem: Coping: Goal: Psychosocial and spiritual needs will be supported Outcome: Progressing   Problem: Respiratory: Goal: Will maintain a patent airway Outcome: Progressing Goal: Complications related to the disease process, condition or treatment will be avoided or minimized Outcome: Progressing

## 2023-04-16 NOTE — Progress Notes (Signed)
PROGRESS NOTE    Monta Zisman  ZOX:[id number] DOB: 07/07/1946 DOA: 04/11/2023 [facility]    Assessment & Plan:   Principal Problem:   Severe sepsis (HCC) Active Problems:   CAP (community acquired pneumonia)   Pneumonia due to COVID-19 virus   Urinary tract infection   AKI (acute kidney injury) (HCC)   Rhabdomyolysis   Acute metabolic encephalopathy   Anemia   Depression with anxiety   History of stroke  Assessment and Plan: Severe sepsis: met criteria w/ tachycardia and tachypnea with soft blood pressure, AKI, AMS, lactic acidosis with elevated procalcitonin as per Dr. Lucianne Muss. Possibly secondary to COVID19 pneumonia vs bacterial co-infection vs UTI. Severe resolved.   Unlikely bacteremia: 1/4 growing staph, likely containment. Repeat blood cxs NGTD  COVID19 pneumonia: w/ vs. possible co-bacterial infection. Pro-cal is elevated 4.12. Continue on bronchodilators & encourage incentive spirometry. Overnight BP dropped & oxygen demand increased so pt was moved to stepdown overnight 04/15/23. CTA chest neg for PE. Started on IV unasyn as pt completed IV rocephin & azithromycin course. Continue on airborne & contact precautions.   Respiratory distress: no documentation of SaO2 below 89%. Likely secondary to pneumonia. Continue on supplemental oxygen and wean as tolerated   NSTEMI: etiology unclear, possibly secondary to COVID19. >3,000 but trending down. Continue on IV heparin drip. Echo ordered. Cardio consulted and recs apprec   R/o Dysphagia: started back on regular diet 04/15/23 as per speech     UTI: hx pseudomonas and ESBL E. coli UTI 11/2021. Urine cx shows no growth. Completed abx course   Urinary retention: bladder scan showed 800 mL, Foley catheter inserted on 8/20. Will do a voiding trial prior to d/c    AKI : resolved   Rhabdomyolysis: unknown whether traumatic or not. CK continues to trend down    Acute metabolic encephalopathy: likely secondary to above  infections. CT head shows no acute intracranial findings. Mental status close to baseline today    Anemia: resolved. Possibly dilutional vs lab error.   Hx of CVA: continue on aspirin, statin    Depression: severity unknown. Continue on home dose of trazodone        DVT prophylaxis: lovenox  Code Status: full  Family Communication: Disposition Plan: likely d/c to SNF   Level of care: Stepdown Consultants:    Procedures:   Antimicrobials: unasyn   Subjective: Pt c/o malaise   Objective: Vitals:   04/16/23 0545 04/16/23 0600 04/16/23 0615 04/16/23 0700  BP: 104/80 95/70  96/70  Pulse: 82 83 90 79  Resp:  (!) 27 (!) 30   Temp:      TempSrc:      SpO2: 96% 94% 94% 98%  Weight:      Height:        Intake/Output Summary (Last 24 hours) at 04/16/2023 0851 Last data filed at 04/16/2023 0800 Gross per 24 hour  Intake 797.85 ml  Output 945 ml  Net -147.15 ml   Filed Weights   04/11/23 1702 04/16/23 0000  Weight: 62.1 kg 58.6 kg    Examination:  General exam: appears calm & comfortable  Respiratory system: diminished breath sounds b/l  Cardiovascular system: S1/S2+. No rubs or clicks Gastrointestinal system: abd is soft, NT, ND & hypoactive bowel sounds  Central nervous system: Alert and oriented. Moves all extremities  Psychiatry: Judgement and insight appears improved.    Data Reviewed: I have personally reviewed following labs and imaging studies  CBC: Recent Labs  Lab 04/11/23 1704  04/12/23 0104 04/13/23 0418 04/14/23 0215 04/15/23 0540 04/15/23 2244 04/16/23 0435  WBC 8.1  --  6.8 6.0 5.9 7.2 6.1  NEUTROABS 7.0  --   --   --   --  6.0  --   HGB 7.8*   < > 12.5 12.3 11.8* 11.4* 11.8*  HCT 23.6*  --  36.9 36.1 33.6* 32.5* 34.7*  MCV 95.2  --  91.8 90.3 88.2 88.8 89.7  PLT 101*  --  120* 136* 154 177 191   < > = values in this interval not displayed.   Basic Metabolic Panel: Recent Labs  Lab 04/13/23 0418 04/14/23 0215 04/14/23 2034  04/15/23 0540 04/15/23 2244 04/16/23 0435  NA 140 137  --  139 133* 139  K 3.0* 2.7* 3.0* 3.6 3.4* 3.4*  CL 108 106  --  101 99 104  CO2 23 24  --  24 24 21*  GLUCOSE 91 92  --  83 143* 138*  BUN 23 16  --  13 15 13   CREATININE 0.64 0.51  --  0.52 0.61 0.54  CALCIUM 7.5* 7.6*  --  7.7* 7.2* 7.8*  MG 2.2 2.1  --  1.8  --  1.9  PHOS 2.6 2.0*  --  2.5  --   --    GFR: Estimated Creatinine Clearance: 48.6 mL/min (by C-G formula based on SCr of 0.54 mg/dL). Liver Function Tests: Recent Labs  Lab 04/11/23 1704 04/12/23 0104  AST 45* 50*  ALT 23 21  ALKPHOS 63 52  BILITOT 1.2 0.9  PROT 7.6 6.6  ALBUMIN 3.4* 2.9*   No results for input(s): "LIPASE", "AMYLASE" in the last 168 hours. No results for input(s): "AMMONIA" in the last 168 hours. Coagulation Profile: Recent Labs  Lab 04/11/23 1704  INR 1.2   Cardiac Enzymes: Recent Labs  Lab 04/11/23 1717 04/13/23 0418 04/14/23 0215 04/15/23 0540  CKTOTAL 1,438* 1,439* 937* 416*   BNP (last 3 results) No results for input(s): "PROBNP" in the last 8760 hours. HbA1C: No results for input(s): "HGBA1C" in the last 72 hours. CBG: Recent Labs  Lab 04/15/23 2221 04/16/23 0047  GLUCAP 144* 136*   Lipid Profile: No results for input(s): "CHOL", "HDL", "LDLCALC", "TRIG", "CHOLHDL", "LDLDIRECT" in the last 72 hours. Thyroid Function Tests: No results for input(s): "TSH", "T4TOTAL", "FREET4", "T3FREE", "THYROIDAB" in the last 72 hours. Anemia Panel: No results for input(s): "VITAMINB12", "FOLATE", "FERRITIN", "TIBC", "IRON", "RETICCTPCT" in the last 72 hours. Sepsis Labs: Recent Labs  Lab 04/11/23 1700 04/11/23 1717 04/11/23 1918 04/12/23 0104 04/15/23 2301  PROCALCITON  --  7.73  --  4.12 2.25  LATICACIDVEN 2.4*  --  2.0*  --   --     Recent Results (from the past 240 hour(s))  Culture, blood (Routine x 2)     Status: Abnormal   Collection Time: 04/11/23  5:04 PM   Specimen: BLOOD  Result Value Ref Range Status    Specimen Description   Final    BLOOD RIGHT ANTECUBITAL Performed at [facility], [address], [place], [place] [address]    Special Requests   Final    BOTTLES DRAWN AEROBIC AND ANAEROBIC Blood Culture adequate volume Performed at [facility], [address] [place], [place] [address]    Culture  Setup Time   Final    AEROBIC BOTTLE ONLY GRAM POSITIVE COCCI CRITICAL RESULT CALLED TO, READ BACK BY AND VERIFIED WITH: TREY GREENWOOD 04/12/23 1151 MW  Culture (A)  Final    STAPHYLOCOCCUS HOMINIS THE SIGNIFICANCE OF ISOLATING THIS ORGANISM FROM A SINGLE SET OF BLOOD CULTURES WHEN MULTIPLE SETS ARE DRAWN IS UNCERTAIN. PLEASE NOTIFY THE MICROBIOLOGY DEPARTMENT WITHIN ONE WEEK IF SPECIATION AND SENSITIVITIES ARE REQUIRED. Performed at [facility], [address] [place], [place] [address]    Report Status 04/13/2023 FINAL  Final  Blood Culture ID Panel (Reflexed)     Status: Abnormal   Collection Time: 04/11/23  5:04 PM  Result Value Ref Range Status   Enterococcus faecalis NOT DETECTED NOT DETECTED Final   Enterococcus Faecium NOT DETECTED NOT DETECTED Final   Listeria monocytogenes NOT DETECTED NOT DETECTED Final   Staphylococcus species DETECTED (A) NOT DETECTED Final    Comment: CRITICAL RESULT CALLED TO, READ BACK BY AND VERIFIED WITH: TREY GREENWOOD 04/12/23 1151 MW    Staphylococcus aureus (BCID) NOT DETECTED NOT DETECTED Final   Staphylococcus epidermidis NOT DETECTED NOT DETECTED Final   Staphylococcus lugdunensis NOT DETECTED NOT DETECTED Final   Streptococcus species NOT DETECTED NOT DETECTED Final   Streptococcus agalactiae NOT DETECTED NOT DETECTED Final   Streptococcus pneumoniae NOT DETECTED NOT DETECTED Final   Streptococcus pyogenes NOT DETECTED NOT DETECTED Final   A.calcoaceticus-baumannii NOT DETECTED NOT DETECTED Final   Bacteroides fragilis NOT DETECTED NOT DETECTED Final   Enterobacterales NOT DETECTED NOT DETECTED Final    Enterobacter cloacae complex NOT DETECTED NOT DETECTED Final   Escherichia coli NOT DETECTED NOT DETECTED Final   Klebsiella aerogenes NOT DETECTED NOT DETECTED Final   Klebsiella oxytoca NOT DETECTED NOT DETECTED Final   Klebsiella pneumoniae NOT DETECTED NOT DETECTED Final   Proteus species NOT DETECTED NOT DETECTED Final   Salmonella species NOT DETECTED NOT DETECTED Final   Serratia marcescens NOT DETECTED NOT DETECTED Final   Haemophilus influenzae NOT DETECTED NOT DETECTED Final   Neisseria meningitidis NOT DETECTED NOT DETECTED Final   Pseudomonas aeruginosa NOT DETECTED NOT DETECTED Final   Stenotrophomonas maltophilia NOT DETECTED NOT DETECTED Final   Candida albicans NOT DETECTED NOT DETECTED Final   Candida auris NOT DETECTED NOT DETECTED Final   Candida glabrata NOT DETECTED NOT DETECTED Final   Candida krusei NOT DETECTED NOT DETECTED Final   Candida parapsilosis NOT DETECTED NOT DETECTED Final   Candida tropicalis NOT DETECTED NOT DETECTED Final   Cryptococcus neoformans/gattii NOT DETECTED NOT DETECTED Final    Comment: Performed at [facility], [address] [place], [place] [address]  SARS Coronavirus 2 by RT PCR (hospital order, performed in [facility] hospital lab) *cepheid single result test* Anterior Nasal Swab     Status: Abnormal   Collection Time: 04/11/23  5:16 PM   Specimen: Anterior Nasal Swab  Result Value Ref Range Status   SARS Coronavirus 2 by RT PCR POSITIVE (A) NEGATIVE Final    Comment: (NOTE) SARS-CoV-2 target nucleic acids are DETECTED  SARS-CoV-2 RNA is generally detectable in upper respiratory specimens  during the acute phase of infection.  Positive results are indicative  of the presence of the identified virus, but do not rule out bacterial infection or co-infection with other pathogens not detected by the test.  Clinical correlation with patient history and  other diagnostic information is necessary to determine  patient infection status.  The expected result is negative.  Fact Sheet for Patients:   RoadLapTop.co.za   Fact Sheet for Healthcare Providers:   http://kim-miller.com/    This test is not yet approved or cleared by the [place] FDA and  has been authorized for detection and/or diagnosis of SARS-CoV-2 by FDA under an Emergency Use Authorization (EUA).  This EUA will remain in effect (meaning this test can be used) for the duration of  the COVID-19 declaration under Section 564(b)(1)  of the Act, 21 U.S.C. section 360-bbb-3(b)(1), unless the authorization is terminated or revoked sooner.   Performed at [facility], [address] [place], [place] [address]   Culture, blood (Routine x 2)     Status: None   Collection Time: 04/11/23  7:18 PM   Specimen: BLOOD  Result Value Ref Range Status   Specimen Description BLOOD BLOOD RIGHT ARM  Final   Special Requests   Final    BOTTLES DRAWN AEROBIC AND ANAEROBIC Blood Culture adequate volume   Culture   Final    NO GROWTH 5 DAYS Performed at [facility], [address], [place], [place] [address]    Report Status 04/16/2023 FINAL  Final  Urine Culture (for pregnant, neutropenic or urologic patients or patients with an indwelling urinary catheter)     Status: None   Collection Time: 04/12/23  7:30 PM   Specimen: Urine, Clean Catch  Result Value Ref Range Status   Specimen Description   Final    URINE, CLEAN CATCH Performed at [facility], [address] [place], [place] [address]    Special Requests   Final    NONE Performed at [facility], [address], [place], [place] [address]    Culture   Final    NO GROWTH Performed at [facility], [address] [place], [place] [address]    Report Status 04/13/2023 FINAL  Final  Gastrointestinal Panel by PCR , Stool     Status: None   Collection Time: 04/13/23  8:05 AM   Specimen:  Stool  Result Value Ref Range Status   Campylobacter species NOT DETECTED NOT DETECTED Final   Plesimonas shigelloides NOT DETECTED NOT DETECTED Final   Salmonella species NOT DETECTED NOT DETECTED Final   Yersinia enterocolitica NOT DETECTED NOT DETECTED Final   Vibrio species NOT DETECTED NOT DETECTED Final   Vibrio cholerae NOT DETECTED NOT DETECTED Final   Enteroaggregative E coli (EAEC) NOT DETECTED NOT DETECTED Final   Enteropathogenic E coli (EPEC) NOT DETECTED NOT DETECTED Final   Enterotoxigenic E coli (ETEC) NOT DETECTED NOT DETECTED Final   Shiga like toxin producing E coli (STEC) NOT DETECTED NOT DETECTED Final   Shigella/Enteroinvasive E coli (EIEC) NOT DETECTED NOT DETECTED Final   Cryptosporidium NOT DETECTED NOT DETECTED Final   Cyclospora cayetanensis NOT DETECTED NOT DETECTED Final   Entamoeba histolytica NOT DETECTED NOT DETECTED Final   Giardia lamblia NOT DETECTED NOT DETECTED Final   Adenovirus F40/41 NOT DETECTED NOT DETECTED Final   Astrovirus NOT DETECTED NOT DETECTED Final   Norovirus GI/GII NOT DETECTED NOT DETECTED Final   Rotavirus A NOT DETECTED NOT DETECTED Final   Sapovirus (I, II, IV, and V) NOT DETECTED NOT DETECTED Final    Comment: Performed at [facility], [address] [place], [place] [address]  C Difficile Quick Screen w PCR reflex     Status: None   Collection Time: 04/13/23  8:05 AM   Specimen: STOOL  Result Value Ref Range Status   C Diff antigen NEGATIVE NEGATIVE Final   C Diff toxin NEGATIVE NEGATIVE Final   C Diff interpretation No C. difficile detected.  Final    Comment:  Performed at [facility], [address] [place], [place] [address]  Culture, blood (Routine X 2) w Reflex to ID Panel     Status: None (Preliminary result)   Collection Time: 04/13/23  5:11 PM   Specimen: BLOOD  Result Value Ref Range Status   Specimen Description BLOOD BLOOD RIGHT ARM  Final   Special Requests   Final    BOTTLES DRAWN  AEROBIC AND ANAEROBIC Blood Culture adequate volume   Culture   Final    NO GROWTH 3 DAYS Performed at [facility], [address] [place], [place] [address]    Report Status PENDING  Incomplete  Culture, blood (Routine X 2) w Reflex to ID Panel     Status: None (Preliminary result)   Collection Time: 04/13/23  5:18 PM   Specimen: BLOOD  Result Value Ref Range Status   Specimen Description BLOOD BLOOD LEFT ARM  Final   Special Requests   Final    BOTTLES DRAWN AEROBIC AND ANAEROBIC Blood Culture results may not be optimal due to an inadequate volume of blood received in culture bottles   Culture   Final    NO GROWTH 3 DAYS Performed at [facility], [address] [place], [place] [address]    Report Status PENDING  Incomplete  MRSA Next Gen by PCR, Nasal     Status: None   Collection Time: 04/16/23  1:33 AM   Specimen: Nasal Mucosa; Nasal Swab  Result Value Ref Range Status   MRSA by PCR Next Gen NOT DETECTED NOT DETECTED Final    Comment: (NOTE) The GeneXpert MRSA Assay (FDA approved for NASAL specimens only), is one component of a comprehensive MRSA colonization surveillance program. It is not intended to diagnose MRSA infection nor to guide or monitor treatment for MRSA infections. Test performance is not FDA approved in patients less than 33 years old. Performed at [facility], [address] [place], [place] [address]          Radiology Studies: US Venous Img Lower Bilateral (DVT)  Result Date: 04/16/2023 CLINICAL DATA:  77 year old female with history of elevated D-dimer. EXAM: BILATERAL LOWER EXTREMITY VENOUS DOPPLER ULTRASOUND TECHNIQUE: Gray-scale sonography with graded compression, as well as color Doppler and duplex ultrasound were performed to evaluate the lower extremity deep venous systems from the level of the common femoral vein and including the common femoral, femoral, profunda femoral, popliteal and calf veins including  the posterior tibial, peroneal and gastrocnemius veins when visible. The superficial great saphenous vein was also interrogated. Spectral Doppler was utilized to evaluate flow at rest and with distal augmentation maneuvers in the common femoral, femoral and popliteal veins. COMPARISON:  None Available. FINDINGS: RIGHT LOWER EXTREMITY Common Femoral Vein: No evidence of thrombus. Normal compressibility, respiratory phasicity and response to augmentation. Saphenofemoral Junction: No evidence of thrombus. Normal compressibility and flow on color Doppler imaging. Profunda Femoral Vein: No evidence of thrombus. Normal compressibility and flow on color Doppler imaging. Femoral Vein: No evidence of thrombus. Normal compressibility, respiratory phasicity and response to augmentation. Popliteal Vein: No evidence of thrombus. Normal compressibility, respiratory phasicity and response to augmentation. Calf Veins: No evidence of thrombus. Normal compressibility and flow on color Doppler imaging. Superficial Great Saphenous Vein: No evidence of thrombus. Normal compressibility. Venous Reflux:  None. Other Findings:  None. LEFT LOWER EXTREMITY Common Femoral Vein: No evidence of thrombus. Normal compressibility, respiratory phasicity and response to augmentation. Saphenofemoral Junction: No evidence of thrombus. Normal compressibility and  flow on color Doppler imaging. Profunda Femoral Vein: No evidence of thrombus. Normal compressibility and flow on color Doppler imaging. Femoral Vein: No evidence of thrombus. Normal compressibility, respiratory phasicity and response to augmentation. Popliteal Vein: No evidence of thrombus. Normal compressibility, respiratory phasicity and response to augmentation. Calf Veins: No evidence of thrombus. Normal compressibility and flow on color Doppler imaging. Superficial Great Saphenous Vein: No evidence of thrombus. Normal compressibility. Venous Reflux:  None. Other Findings:  None.  IMPRESSION: No evidence of deep venous thrombosis in either lower extremity. Electronically Signed   By: Trudie Reed M.D.   On: 04/16/2023 05:03   CT Angio Chest Pulmonary Embolism (PE) W or WO Contrast  Result Date: 04/16/2023 CLINICAL DATA:  Sepsis following COVID-19 infection with difficulty breathing, evaluate for pulmonary emboli EXAM: CT ANGIOGRAPHY CHEST WITH CONTRAST TECHNIQUE: Multidetector CT imaging of the chest was performed using the standard protocol during bolus administration of intravenous contrast. Multiplanar CT image reconstructions and MIPs were obtained to evaluate the vascular anatomy. RADIATION DOSE REDUCTION: This exam was performed according to the departmental dose-optimization program which includes automated exposure control, adjustment of the mA and/or kV according to patient size and/or use of iterative reconstruction technique. CONTRAST:  75mL OMNIPAQUE IOHEXOL 350 MG/ML SOLN COMPARISON:  Chest x-ray from the previous day. FINDINGS: Cardiovascular: Thoracic aorta shows normal branching pattern. No aneurysmal dilatation is noted. No cardiac enlargement is seen. Pulmonary artery is well visualized within normal branching pattern bilaterally. No filling defect to suggest pulmonary embolism is noted. Mediastinum/Nodes: Thoracic inlet is within normal limits. No hilar or mediastinal adenopathy is noted. The esophagus as visualized is within normal limits. Small sliding-type hiatal hernia is noted. Lungs/Pleura: Small effusions are noted bilaterally right slightly greater than left. Patchy airspace opacities are noted in both lungs greater on the right than the left. No parenchymal nodules are seen. Upper Abdomen: Remainder of the upper abdomen is within normal limits. Musculoskeletal: No acute rib fractures are seen. T8, T11 and T12 compression fractures are noted which appear chronic in nature. Review of the MIP images confirms the above findings. IMPRESSION: No evidence of  pulmonary emboli. Bilateral effusions with patchy airspace opacities right greater than left. Old compression deformities as described. Electronically Signed   By: Alcide Clever M.D.   On: 04/16/2023 00:51   DG Chest Port 1 View  Result Date: 04/15/2023 CLINICAL DATA:  Shortness of breath.  Hypoxia.  COVID positive. EXAM: PORTABLE CHEST 1 VIEW COMPARISON:  Radiograph yesterday FINDINGS: Shifting bilateral pulmonary opacities, more extensive on the right. There is some improvement in the diffuse right lung opacity, however worsening in the perihilar and left basilar opacity. Stable heart size and mediastinal contours. Possible small pleural effusions. No pneumothorax. IMPRESSION: Shifting bilateral pulmonary opacities, more extensive on the right. There is some improvement in the diffuse right lung opacity, however worsening in the left perihilar and basilar opacity. Findings are suspicious for multifocal pneumonia. Electronically Signed   By: Narda Rutherford M.D.   On: 04/15/2023 22:41        Scheduled Meds:  aspirin  81 mg Oral Daily   Chlorhexidine Gluconate Cloth  6 each Topical Daily   donepezil  10 mg Oral QHS   feeding supplement (KATE FARMS STANDARD 1.4)  325 mL Oral TID BM   guaiFENesin  15 mL Oral TID   methylPREDNISolone (SOLU-MEDROL) injection  40 mg Intravenous Q12H   multivitamin with minerals  1 tablet Oral Q lunch   mouth rinse  15  mL Mouth Rinse 4 times per day   saccharomyces boulardii  250 mg Oral BID   Continuous Infusions:  ampicillin-sulbactam (UNASYN) IV 3 g (04/16/23 0806)   heparin 750 Units/hr (04/16/23 0500)     LOS: 5 days    Time spent: 35 mins     Charise Killian, MD [facility] Pager [phone]  If 7PM-7AM, please contact night-coverage [email or web address] 04/16/2023, 8:51 AM

## 2023-04-17 ENCOUNTER — Inpatient Hospital Stay (HOSPITAL_COMMUNITY)
Admit: 2023-04-17 | Discharge: 2023-04-17 | Disposition: A | Discharge status: Home / Self Care | Payer: Medicare HMO | Attending: Acute Care | Admitting: Acute Care

## 2023-04-17 DIAGNOSIS — J1282 Pneumonia due to coronavirus disease 2019: Secondary | ICD-10-CM | POA: Diagnosis not present

## 2023-04-17 DIAGNOSIS — U071 COVID-19: Secondary | ICD-10-CM | POA: Diagnosis not present

## 2023-04-17 DIAGNOSIS — I214 Non-ST elevation (NSTEMI) myocardial infarction: Secondary | ICD-10-CM

## 2023-04-17 LAB — ECHOCARDIOGRAM COMPLETE BUBBLE STUDY
AR max vel: 2.3 cm2
AV Peak grad: 5.3 mmHg
Ao pk vel: 1.15 m/s
Area-P 1/2: 7.9 cm2
Calc EF: 41.9 %
P 1/2 time: 465 ms
S' Lateral: 3.1 cm
Single Plane A2C EF: 36 %
Single Plane A4C EF: 43.9 %

## 2023-04-17 LAB — CBC
HCT: 32.5 % — ABNORMAL LOW (ref 36.0–46.0)
Hemoglobin: 11.2 g/dL — ABNORMAL LOW (ref 12.0–15.0)
MCH: 30.7 pg (ref 26.0–34.0)
MCHC: 34.5 g/dL (ref 30.0–36.0)
MCV: 89 fL (ref 80.0–100.0)
Platelets: 259 10*3/uL (ref 150–400)
RBC: 3.65 MIL/uL — ABNORMAL LOW (ref 3.87–5.11)
RDW: 13.9 % (ref 11.5–15.5)
WBC: 8.3 10*3/uL (ref 4.0–10.5)
nRBC: 0 % (ref 0.0–0.2)

## 2023-04-17 LAB — BASIC METABOLIC PANEL
Anion gap: 8 (ref 5–15)
BUN: 20 mg/dL (ref 8–23)
CO2: 23 mmol/L (ref 22–32)
Calcium: 7.6 mg/dL — ABNORMAL LOW (ref 8.9–10.3)
Chloride: 104 mmol/L (ref 98–111)
Creatinine, Ser: 0.54 mg/dL (ref 0.44–1.00)
GFR, Estimated: >60 mL/min (ref >60)
Glucose, Bld: 151 mg/dL — ABNORMAL HIGH (ref 70–99)
Potassium: 3.4 mmol/L — ABNORMAL LOW (ref 3.5–5.1)
Sodium: 135 mmol/L (ref 135–145)

## 2023-04-17 LAB — HEPARIN LEVEL (UNFRACTIONATED): Heparin Unfractionated: 0.31 [IU]/mL (ref 0.30–0.70)

## 2023-04-17 LAB — CK: Total CK: 314 U/L — ABNORMAL HIGH (ref 38–234)

## 2023-04-17 MED ORDER — POTASSIUM CHLORIDE CRYS ER 20 MEQ PO TBCR
40.0000 meq | EXTENDED_RELEASE_TABLET | Freq: Once | ORAL | Status: AC
  Administered 2023-04-17: 40 meq via ORAL
  Filled 2023-04-17: qty 2

## 2023-04-17 MED ORDER — POLYETHYLENE GLYCOL 3350 17 G PO PACK: 17.0000 g | PACK | Freq: Every day | ORAL | Status: DC | PRN

## 2023-04-17 MED ORDER — DOCUSATE SODIUM 100 MG PO CAPS
200.0000 mg | ORAL_CAPSULE | Freq: Two times a day (BID) | ORAL | Status: DC
  Administered 2023-04-18 – 2023-04-19 (×3): 200 mg via ORAL
  Filled 2023-04-17 (×5): qty 2

## 2023-04-17 MED ORDER — SACCHAROMYCES BOULARDII 250 MG PO CAPS
250.0000 mg | ORAL_CAPSULE | Freq: Two times a day (BID) | ORAL | Status: AC
  Administered 2023-04-17 – 2023-04-21 (×10): 250 mg via ORAL
  Filled 2023-04-17 (×10): qty 1

## 2023-04-17 MED ORDER — POLYETHYLENE GLYCOL 3350 17 G PO PACK: 17.0000 g | PACK | Freq: Every day | ORAL | Status: DC

## 2023-04-17 NOTE — Progress Notes (Signed)
  Echocardiogram 2D Echocardiogram has been performed. A Saline Microcavitation (Bubble Study)was requested and completed.  Teresa Sherman 04/17/2023, 9:08 AM

## 2023-04-17 NOTE — Progress Notes (Signed)
ANTICOAGULATION CONSULT NOTE  Pharmacy Consult for Heparin Infusion Indication: chest pain/ACS  Allergies  Allergen Reactions   Duloxetine Hcl Nausea And Vomiting   Monosodium Glutamate     migraine   Nitrates, Organic     migraine   Sodium Benzoate [Nutritional Supplements]     migraine    Patient Measurements: Height: 5' (152.4 cm) Weight: 58.6 kg (129 lb 3 oz) IBW/kg (Calculated) : 45.5 Heparin Dosing Weight: 62.1 kg  Vital Signs: Temp: 97.7 F (36.5 C) (08/25 0400) Temp Source: Oral (08/25 0400) BP: 103/72 (08/25 0500) Pulse Rate: 85 (08/25 0500)  Labs: Recent Labs    04/15/23 0540 04/15/23 2244 04/16/23 0117 04/16/23 0435 04/16/23 0903 04/16/23 1732 04/17/23 0358  HGB 11.8* 11.4*  --  11.8*  --   --  11.2*  HCT 33.6* 32.5*  --  34.7*  --   --  32.5*  PLT 154 177  --  191  --   --  259  HEPARINUNFRC  --   --   --   --  0.55 0.39 0.31  CREATININE 0.52 0.61  --  0.54  --   --  0.54  CKTOTAL 416*  --   --   --   --   --  314*  TROPONINIHS  --  3,734* 2,814*  --   --   --   --     Estimated Creatinine Clearance: 48.6 mL/min (by C-G formula based on SCr of 0.54 mg/dL).   Medical History: Past Medical History:  Diagnosis Date   Allergy    Anemia 04/11/2023   Anxiety    Cataract    Combined fat and carbohydrate induced hyperlipemia 01/26/2015   Depression    GERD (gastroesophageal reflux disease)    Assessment: Patient is a 77yo female admitted for sepsis d/t Covid. Now with elevated troponins. Pharmacy consulted for Heparin dosing for ACS/STEMI.  Goal of Therapy:  Heparin level 0.3-0.7 units/ml Monitor platelets by anticoagulation protocol: Yes.  08/24 0903   HL 0.55, therapeutic x1 08/24 1732   HL 0.39, therapeutic x2 08/25 0358   HL 0.31, therapeutic X 3    Hgb 11.8, plt WNL. No issues with infusion reported, no signs/symptoms of bleeding noted.   Plan: Continue heparin infusion at 750 units/hour Monitor daily Anti-Xa levels while on heparin  infusion Monitor CBC and signs/symptoms of bleeding  Thank you for involving pharmacy in this patient's care.   Kavin Weckwerth D Clinical Pharmacist 04/17/2023 5:11 AM

## 2023-04-17 NOTE — Progress Notes (Signed)
PROGRESS NOTE    Jamilett Freehill  AOZ:[id number] DOB: Mar 16, 1946 DOA: 04/11/2023 [facility]    Assessment & Plan:   Principal Problem:   Severe sepsis (HCC) Active Problems:   CAP (community acquired pneumonia)   Pneumonia due to COVID-19 virus   Urinary tract infection   AKI (acute kidney injury) (HCC)   Rhabdomyolysis   Acute metabolic encephalopathy   Anemia   Depression with anxiety   History of stroke  Assessment and Plan: Severe sepsis: met criteria w/ tachycardia and tachypnea with soft blood pressure, AKI, AMS, lactic acidosis with elevated procalcitonin as per Dr. Lucianne Muss. Possibly secondary to COVID19 pneumonia vs bacterial co-infection vs UTI. Severe sepsis resolved    Unlikely bacteremia: 1/4 growing staph, likely containment. Repeat blood cxs NGTD  COVID19 pneumonia: w/ vs. possible co-bacterial infection. Pro-cal is elevated 4.12. Continue on bronchodilators & encourage incentive spirometry. Overnight BP dropped & oxygen demand increased so pt was moved to stepdown overnight 04/15/23. CTA chest neg for PE. Continue on IV unasyn which was started overnight on when a rapid response was called. Completed rocephin, azithromycin course. Continue on airborne & contact precautions  Respiratory distress: no documentation of SaO2 below 89%. Likely secondary to pneumonia. Continue on supplemental oxygen and wean as tolerated  NSTEMI: etiology unclear, possibly secondary to COVID19. >3,000 but trending down. Continue on aspirin, statin & IV heparin drip as per cardio. Echo is pending. Cardio following and recs apprec   R/o Dysphagia: started back on regular diet 04/15/23 as per speech     UTI: hx pseudomonas and ESBL E. coli UTI 11/2021. Urine cx shows no growth. Completed abx course   Urinary retention: bladder scan showed 800 mL, Foley catheter inserted on 8/20. Will do a voiding trial prior to d/c    AKI : resolved   Rhabdomyolysis: unknown whether traumatic or  not. CK is still elevated but trending down daily    Acute metabolic encephalopathy: likely secondary to above infections. CT head shows no acute intracranial findings. Mental status waxes and wanes    Anemia: resolved. Possibly dilutional vs lab error.   Hx of CVA: continue on statin, aspirin    Depression: severity unknown. Continue on home dose of trazodone    Constipation: started on colace. Miralax prn      DVT prophylaxis: lovenox  Code Status: full  Family Communication: Disposition Plan: likely d/c to SNF   Level of care: Progressive Consultants:    Procedures:   Antimicrobials: unasyn   Subjective: Pt c/o constipation   Objective: Vitals:   04/17/23 0500 04/17/23 0600 04/17/23 0700 04/17/23 0800  BP: 103/72 103/75 104/79 113/85  Pulse: 85 89 91 91  Resp: (!) 21 15 (!) 21 19  Temp:    97.6 F (36.4 C)  TempSrc:    Axillary  SpO2: 94% 96% 94% 96%  Weight:      Height:        Intake/Output Summary (Last 24 hours) at 04/17/2023 0858 Last data filed at 04/17/2023 0800 Gross per 24 hour  Intake 1060.06 ml  Output 829 ml  Net 231.06 ml   Filed Weights   04/11/23 1702 04/16/23 0000  Weight: 62.1 kg 58.6 kg    Examination:  General exam: appears comfortable  Respiratory system: decreased breath sounds b/l  Cardiovascular system: S1 & S2+. No rubs or clicks Gastrointestinal system: abd is soft, NT, ND & hypoactive bowel sounds  Central nervous system: alert & oriented. Moves all extremities Psychiatry: judgement and  insight appears improved. Labile mood and affect   Data Reviewed: I have personally reviewed following labs and imaging studies  CBC: Recent Labs  Lab 04/11/23 1704 04/12/23 0104 04/14/23 0215 04/15/23 0540 04/15/23 2244 04/16/23 0435 04/17/23 0358  WBC 8.1   < > 6.0 5.9 7.2 6.1 8.3  NEUTROABS 7.0  --   --   --  6.0  --   --   HGB 7.8*   < > 12.3 11.8* 11.4* 11.8* 11.2*  HCT 23.6*   < > 36.1 33.6* 32.5* 34.7* 32.5*  MCV 95.2    < > 90.3 88.2 88.8 89.7 89.0  PLT 101*   < > 136* 154 177 191 259   < > = values in this interval not displayed.   Basic Metabolic Panel: Recent Labs  Lab 04/13/23 0418 04/14/23 0215 04/14/23 2034 04/15/23 0540 04/15/23 2244 04/16/23 0435 04/17/23 0358  NA 140 137  --  139 133* 139 135  K 3.0* 2.7* 3.0* 3.6 3.4* 3.4* 3.4*  CL 108 106  --  101 99 104 104  CO2 23 24  --  24 24 21* 23  GLUCOSE 91 92  --  83 143* 138* 151*  BUN 23 16  --  13 15 13 20   CREATININE 0.64 0.51  --  0.52 0.61 0.54 0.54  CALCIUM 7.5* 7.6*  --  7.7* 7.2* 7.8* 7.6*  MG 2.2 2.1  --  1.8  --  1.9  --   PHOS 2.6 2.0*  --  2.5  --   --   --    GFR: Estimated Creatinine Clearance: 48.6 mL/min (by C-G formula based on SCr of 0.54 mg/dL). Liver Function Tests: Recent Labs  Lab 04/11/23 1704 04/12/23 0104  AST 45* 50*  ALT 23 21  ALKPHOS 63 52  BILITOT 1.2 0.9  PROT 7.6 6.6  ALBUMIN 3.4* 2.9*   No results for input(s): "LIPASE", "AMYLASE" in the last 168 hours. No results for input(s): "AMMONIA" in the last 168 hours. Coagulation Profile: Recent Labs  Lab 04/11/23 1704  INR 1.2   Cardiac Enzymes: Recent Labs  Lab 04/11/23 1717 04/13/23 0418 04/14/23 0215 04/15/23 0540 04/17/23 0358  CKTOTAL 1,438* 1,439* 937* 416* 314*   BNP (last 3 results) No results for input(s): "PROBNP" in the last 8760 hours. HbA1C: No results for input(s): "HGBA1C" in the last 72 hours. CBG: Recent Labs  Lab 04/15/23 2221 04/16/23 0047  GLUCAP 144* 136*   Lipid Profile: No results for input(s): "CHOL", "HDL", "LDLCALC", "TRIG", "CHOLHDL", "LDLDIRECT" in the last 72 hours. Thyroid Function Tests: No results for input(s): "TSH", "T4TOTAL", "FREET4", "T3FREE", "THYROIDAB" in the last 72 hours. Anemia Panel: No results for input(s): "VITAMINB12", "FOLATE", "FERRITIN", "TIBC", "IRON", "RETICCTPCT" in the last 72 hours. Sepsis Labs: Recent Labs  Lab 04/11/23 1700 04/11/23 1717 04/11/23 1918 04/12/23 0104  04/15/23 2301  PROCALCITON  --  7.73  --  4.12 2.25  LATICACIDVEN 2.4*  --  2.0*  --   --     Recent Results (from the past 240 hour(s))  Culture, blood (Routine x 2)     Status: Abnormal   Collection Time: 04/11/23  5:04 PM   Specimen: BLOOD  Result Value Ref Range Status   Specimen Description   Final    BLOOD RIGHT ANTECUBITAL Performed at [facility], [address] [place], [place] [address]    Special Requests   Final    BOTTLES DRAWN AEROBIC AND ANAEROBIC Blood Culture  adequate volume Performed at [facility], [address] [place], [place] [address]    Culture  Setup Time   Final    AEROBIC BOTTLE ONLY GRAM POSITIVE COCCI CRITICAL RESULT CALLED TO, READ BACK BY AND VERIFIED WITH: TREY GREENWOOD 04/12/23 1151 MW    Culture (A)  Final    STAPHYLOCOCCUS HOMINIS THE SIGNIFICANCE OF ISOLATING THIS ORGANISM FROM A SINGLE SET OF BLOOD CULTURES WHEN MULTIPLE SETS ARE DRAWN IS UNCERTAIN. PLEASE NOTIFY THE MICROBIOLOGY DEPARTMENT WITHIN ONE WEEK IF SPECIATION AND SENSITIVITIES ARE REQUIRED. Performed at [facility], [address], [place], [place] [address]    Report Status 04/13/2023 FINAL  Final  Blood Culture ID Panel (Reflexed)     Status: Abnormal   Collection Time: 04/11/23  5:04 PM  Result Value Ref Range Status   Enterococcus faecalis NOT DETECTED NOT DETECTED Final   Enterococcus Faecium NOT DETECTED NOT DETECTED Final   Listeria monocytogenes NOT DETECTED NOT DETECTED Final   Staphylococcus species DETECTED (A) NOT DETECTED Final    Comment: CRITICAL RESULT CALLED TO, READ BACK BY AND VERIFIED WITH: TREY GREENWOOD 04/12/23 1151 MW    Staphylococcus aureus (BCID) NOT DETECTED NOT DETECTED Final   Staphylococcus epidermidis NOT DETECTED NOT DETECTED Final   Staphylococcus lugdunensis NOT DETECTED NOT DETECTED Final   Streptococcus species NOT DETECTED NOT DETECTED Final   Streptococcus agalactiae NOT DETECTED NOT DETECTED Final    Streptococcus pneumoniae NOT DETECTED NOT DETECTED Final   Streptococcus pyogenes NOT DETECTED NOT DETECTED Final   A.calcoaceticus-baumannii NOT DETECTED NOT DETECTED Final   Bacteroides fragilis NOT DETECTED NOT DETECTED Final   Enterobacterales NOT DETECTED NOT DETECTED Final   Enterobacter cloacae complex NOT DETECTED NOT DETECTED Final   Escherichia coli NOT DETECTED NOT DETECTED Final   Klebsiella aerogenes NOT DETECTED NOT DETECTED Final   Klebsiella oxytoca NOT DETECTED NOT DETECTED Final   Klebsiella pneumoniae NOT DETECTED NOT DETECTED Final   Proteus species NOT DETECTED NOT DETECTED Final   Salmonella species NOT DETECTED NOT DETECTED Final   Serratia marcescens NOT DETECTED NOT DETECTED Final   Haemophilus influenzae NOT DETECTED NOT DETECTED Final   Neisseria meningitidis NOT DETECTED NOT DETECTED Final   Pseudomonas aeruginosa NOT DETECTED NOT DETECTED Final   Stenotrophomonas maltophilia NOT DETECTED NOT DETECTED Final   Candida albicans NOT DETECTED NOT DETECTED Final   Candida auris NOT DETECTED NOT DETECTED Final   Candida glabrata NOT DETECTED NOT DETECTED Final   Candida krusei NOT DETECTED NOT DETECTED Final   Candida parapsilosis NOT DETECTED NOT DETECTED Final   Candida tropicalis NOT DETECTED NOT DETECTED Final   Cryptococcus neoformans/gattii NOT DETECTED NOT DETECTED Final    Comment: Performed at [facility], [address] [place], [place] [address]  SARS Coronavirus 2 by RT PCR (hospital order, performed in [facility] hospital lab) *cepheid single result test* Anterior Nasal Swab     Status: Abnormal   Collection Time: 04/11/23  5:16 PM   Specimen: Anterior Nasal Swab  Result Value Ref Range Status   SARS Coronavirus 2 by RT PCR POSITIVE (A) NEGATIVE Final    Comment: (NOTE) SARS-CoV-2 target nucleic acids are DETECTED  SARS-CoV-2 RNA is generally detectable in upper respiratory specimens  during the acute phase of infection.  Positive  results are indicative  of the presence of the identified virus, but do not rule out bacterial infection or co-infection with other pathogens not detected by the test.  Clinical correlation with patient  history and  other diagnostic information is necessary to determine patient infection status.  The expected result is negative.  Fact Sheet for Patients:   RoadLapTop.co.za   Fact Sheet for Healthcare Providers:   http://kim-miller.com/    This test is not yet approved or cleared by the [place] FDA and  has been authorized for detection and/or diagnosis of SARS-CoV-2 by FDA under an Emergency Use Authorization (EUA).  This EUA will remain in effect (meaning this test can be used) for the duration of  the COVID-19 declaration under Section 564(b)(1)  of the Act, 21 U.S.C. section 360-bbb-3(b)(1), unless the authorization is terminated or revoked sooner.   Performed at [facility], [address] [place], [place] [address]   Culture, blood (Routine x 2)     Status: None   Collection Time: 04/11/23  7:18 PM   Specimen: BLOOD  Result Value Ref Range Status   Specimen Description BLOOD BLOOD RIGHT ARM  Final   Special Requests   Final    BOTTLES DRAWN AEROBIC AND ANAEROBIC Blood Culture adequate volume   Culture   Final    NO GROWTH 5 DAYS Performed at [facility], [address] [place], [place] [address]    Report Status 04/16/2023 FINAL  Final  Urine Culture (for pregnant, neutropenic or urologic patients or patients with an indwelling urinary catheter)     Status: None   Collection Time: 04/12/23  7:30 PM   Specimen: Urine, Clean Catch  Result Value Ref Range Status   Specimen Description   Final    URINE, CLEAN CATCH Performed at [facility], [address], [place], [place] [address]    Special Requests   Final    NONE Performed at [facility], [address]  [place], [place] [address]    Culture   Final    NO GROWTH Performed at [facility], [address], [place], [place] [address]    Report Status 04/13/2023 FINAL  Final  Gastrointestinal Panel by PCR , Stool     Status: None   Collection Time: 04/13/23  8:05 AM   Specimen: Stool  Result Value Ref Range Status   Campylobacter species NOT DETECTED NOT DETECTED Final   Plesimonas shigelloides NOT DETECTED NOT DETECTED Final   Salmonella species NOT DETECTED NOT DETECTED Final   Yersinia enterocolitica NOT DETECTED NOT DETECTED Final   Vibrio species NOT DETECTED NOT DETECTED Final   Vibrio cholerae NOT DETECTED NOT DETECTED Final   Enteroaggregative E coli (EAEC) NOT DETECTED NOT DETECTED Final   Enteropathogenic E coli (EPEC) NOT DETECTED NOT DETECTED Final   Enterotoxigenic E coli (ETEC) NOT DETECTED NOT DETECTED Final   Shiga like toxin producing E coli (STEC) NOT DETECTED NOT DETECTED Final   Shigella/Enteroinvasive E coli (EIEC) NOT DETECTED NOT DETECTED Final   Cryptosporidium NOT DETECTED NOT DETECTED Final   Cyclospora cayetanensis NOT DETECTED NOT DETECTED Final   Entamoeba histolytica NOT DETECTED NOT DETECTED Final   Giardia lamblia NOT DETECTED NOT DETECTED Final   Adenovirus F40/41 NOT DETECTED NOT DETECTED Final   Astrovirus NOT DETECTED NOT DETECTED Final   Norovirus GI/GII NOT DETECTED NOT DETECTED Final   Rotavirus A NOT DETECTED NOT DETECTED Final   Sapovirus (I, II, IV, and V) NOT DETECTED NOT DETECTED Final    Comment: Performed at [facility], [address], [place], [place] [address]  C Difficile Quick Screen w PCR reflex  Status: None   Collection Time: 04/13/23  8:05 AM   Specimen: STOOL  Result Value Ref Range Status   C Diff antigen NEGATIVE NEGATIVE Final   C Diff toxin NEGATIVE NEGATIVE Final   C Diff interpretation No C. difficile detected.  Final    Comment: Performed at [facility], [address] [place], [place]  [address]  Culture, blood (Routine X 2) w Reflex to ID Panel     Status: None (Preliminary result)   Collection Time: 04/13/23  5:11 PM   Specimen: BLOOD  Result Value Ref Range Status   Specimen Description BLOOD BLOOD RIGHT ARM  Final   Special Requests   Final    BOTTLES DRAWN AEROBIC AND ANAEROBIC Blood Culture adequate volume   Culture   Final    NO GROWTH 4 DAYS Performed at [facility], [address] [place], [place] [address]    Report Status PENDING  Incomplete  Culture, blood (Routine X 2) w Reflex to ID Panel     Status: None (Preliminary result)   Collection Time: 04/13/23  5:18 PM   Specimen: BLOOD  Result Value Ref Range Status   Specimen Description BLOOD BLOOD LEFT ARM  Final   Special Requests   Final    BOTTLES DRAWN AEROBIC AND ANAEROBIC Blood Culture results may not be optimal due to an inadequate volume of blood received in culture bottles   Culture   Final    NO GROWTH 4 DAYS Performed at [facility], [address] [place], [place] [address]    Report Status PENDING  Incomplete  MRSA Next Gen by PCR, Nasal     Status: None   Collection Time: 04/16/23  1:33 AM   Specimen: Nasal Mucosa; Nasal Swab  Result Value Ref Range Status   MRSA by PCR Next Gen NOT DETECTED NOT DETECTED Final    Comment: (NOTE) The GeneXpert MRSA Assay (FDA approved for NASAL specimens only), is one component of a comprehensive MRSA colonization surveillance program. It is not intended to diagnose MRSA infection nor to guide or monitor treatment for MRSA infections. Test performance is not FDA approved in patients less than 21 years old. Performed at [facility], [address] [place], [place] [address]          Radiology Studies: US Venous Img Lower Bilateral (DVT)  Result Date: 04/16/2023 CLINICAL DATA:  77 year old female with history of elevated D-dimer. EXAM: BILATERAL LOWER EXTREMITY VENOUS DOPPLER ULTRASOUND TECHNIQUE: Gray-scale  sonography with graded compression, as well as color Doppler and duplex ultrasound were performed to evaluate the lower extremity deep venous systems from the level of the common femoral vein and including the common femoral, femoral, profunda femoral, popliteal and calf veins including the posterior tibial, peroneal and gastrocnemius veins when visible. The superficial great saphenous vein was also interrogated. Spectral Doppler was utilized to evaluate flow at rest and with distal augmentation maneuvers in the common femoral, femoral and popliteal veins. COMPARISON:  None Available. FINDINGS: RIGHT LOWER EXTREMITY Common Femoral Vein: No evidence of thrombus. Normal compressibility, respiratory phasicity and response to augmentation. Saphenofemoral Junction: No evidence of thrombus. Normal compressibility and flow on color Doppler imaging. Profunda Femoral Vein: No evidence of thrombus. Normal compressibility and flow on color Doppler imaging. Femoral Vein: No evidence of thrombus. Normal compressibility, respiratory phasicity and response to augmentation. Popliteal Vein: No evidence of thrombus. Normal compressibility, respiratory phasicity and response to augmentation. Calf Veins: No evidence of thrombus. Normal compressibility  and flow on color Doppler imaging. Superficial Great Saphenous Vein: No evidence of thrombus. Normal compressibility. Venous Reflux:  None. Other Findings:  None. LEFT LOWER EXTREMITY Common Femoral Vein: No evidence of thrombus. Normal compressibility, respiratory phasicity and response to augmentation. Saphenofemoral Junction: No evidence of thrombus. Normal compressibility and flow on color Doppler imaging. Profunda Femoral Vein: No evidence of thrombus. Normal compressibility and flow on color Doppler imaging. Femoral Vein: No evidence of thrombus. Normal compressibility, respiratory phasicity and response to augmentation. Popliteal Vein: No evidence of thrombus. Normal  compressibility, respiratory phasicity and response to augmentation. Calf Veins: No evidence of thrombus. Normal compressibility and flow on color Doppler imaging. Superficial Great Saphenous Vein: No evidence of thrombus. Normal compressibility. Venous Reflux:  None. Other Findings:  None. IMPRESSION: No evidence of deep venous thrombosis in either lower extremity. Electronically Signed   By: Trudie Reed M.D.   On: 04/16/2023 05:03   CT Angio Chest Pulmonary Embolism (PE) W or WO Contrast  Result Date: 04/16/2023 CLINICAL DATA:  Sepsis following COVID-19 infection with difficulty breathing, evaluate for pulmonary emboli EXAM: CT ANGIOGRAPHY CHEST WITH CONTRAST TECHNIQUE: Multidetector CT imaging of the chest was performed using the standard protocol during bolus administration of intravenous contrast. Multiplanar CT image reconstructions and MIPs were obtained to evaluate the vascular anatomy. RADIATION DOSE REDUCTION: This exam was performed according to the departmental dose-optimization program which includes automated exposure control, adjustment of the mA and/or kV according to patient size and/or use of iterative reconstruction technique. CONTRAST:  75mL OMNIPAQUE IOHEXOL 350 MG/ML SOLN COMPARISON:  Chest x-ray from the previous day. FINDINGS: Cardiovascular: Thoracic aorta shows normal branching pattern. No aneurysmal dilatation is noted. No cardiac enlargement is seen. Pulmonary artery is well visualized within normal branching pattern bilaterally. No filling defect to suggest pulmonary embolism is noted. Mediastinum/Nodes: Thoracic inlet is within normal limits. No hilar or mediastinal adenopathy is noted. The esophagus as visualized is within normal limits. Small sliding-type hiatal hernia is noted. Lungs/Pleura: Small effusions are noted bilaterally right slightly greater than left. Patchy airspace opacities are noted in both lungs greater on the right than the left. No parenchymal nodules are  seen. Upper Abdomen: Remainder of the upper abdomen is within normal limits. Musculoskeletal: No acute rib fractures are seen. T8, T11 and T12 compression fractures are noted which appear chronic in nature. Review of the MIP images confirms the above findings. IMPRESSION: No evidence of pulmonary emboli. Bilateral effusions with patchy airspace opacities right greater than left. Old compression deformities as described. Electronically Signed   By: Alcide Clever M.D.   On: 04/16/2023 00:51   DG Chest Port 1 View  Result Date: 04/15/2023 CLINICAL DATA:  Shortness of breath.  Hypoxia.  COVID positive. EXAM: PORTABLE CHEST 1 VIEW COMPARISON:  Radiograph yesterday FINDINGS: Shifting bilateral pulmonary opacities, more extensive on the right. There is some improvement in the diffuse right lung opacity, however worsening in the perihilar and left basilar opacity. Stable heart size and mediastinal contours. Possible small pleural effusions. No pneumothorax. IMPRESSION: Shifting bilateral pulmonary opacities, more extensive on the right. There is some improvement in the diffuse right lung opacity, however worsening in the left perihilar and basilar opacity. Findings are suspicious for multifocal pneumonia. Electronically Signed   By: Narda Rutherford M.D.   On: 04/15/2023 22:41        Scheduled Meds:  aspirin  81 mg Oral Daily   Chlorhexidine Gluconate Cloth  6 each Topical Daily   donepezil  10 mg  Oral QHS   feeding supplement (KATE FARMS STANDARD 1.4)  325 mL Oral TID BM   guaiFENesin  15 mL Oral TID   methylPREDNISolone (SOLU-MEDROL) injection  40 mg Intravenous Q12H   multivitamin with minerals  1 tablet Oral Q lunch   potassium chloride  40 mEq Oral Once   rosuvastatin  40 mg Oral Q2200   saccharomyces boulardii  250 mg Oral BID   saccharomyces boulardii  250 mg Oral BID   Continuous Infusions:  ampicillin-sulbactam (UNASYN) IV 3 g (04/17/23 0801)   heparin 750 Units/hr (04/16/23 0500)      LOS: 6 days    Time spent: 25 mins     Charise Killian, MD [facility] Pager [phone]  If 7PM-7AM, please contact night-coverage [email or web address] 04/17/2023, 8:58 AM

## 2023-04-17 NOTE — Progress Notes (Signed)
PHARMACY CONSULT NOTE - ELECTROLYTES  Pharmacy Consult for Electrolyte Monitoring and Replacement   Recent Labs: Height: 5' (152.4 cm) Weight: 58.6 kg (129 lb 3 oz) IBW/kg (Calculated) : 45.5 Estimated Creatinine Clearance: 48.6 mL/min (by C-G formula based on SCr of 0.54 mg/dL). Potassium (mmol/L)  Date Value  04/17/2023 3.4 (L)  11/26/2013 3.8   Magnesium (mg/dL)  Date Value  78/29/5621 1.9   Calcium (mg/dL)  Date Value  30/86/5784 7.6 (L)   Calcium, Total (mg/dL)  Date Value  69/62/9528 9.5   Albumin (g/dL)  Date Value  41/32/4401 2.9 (L)  04/30/2015 4.3  11/26/2013 3.9   Phosphorus (mg/dL)  Date Value  02/72/5366 2.5   Sodium (mmol/L)  Date Value  04/17/2023 135  04/30/2015 140  11/26/2013 142   Corrected Ca: 8.6 mg/dL  Assessment  Teresa Sherman is a 77 y.o. female presenting with Severe sepsis secondary to pneumonia . PMH significant for CVA, Anemia, and depression. Pharmacy has been consulted to monitor and replace electrolytes.  Goal of Therapy: Electrolytes WNL  Plan:  40 mEq po KCl x 1 Check BMP and Mag with AM labs  Thank you for involving pharmacy in this patient's care.   Burnis Medin, PharmD, BCPS Clinical Pharmacist 04/17/2023 6:59 AM

## 2023-04-17 NOTE — Progress Notes (Signed)
Speech Language Pathology Treatment: Dysphagia  Patient Details Name: Teresa Sherman MRN: [id number] DOB: 12-Mar-1946 Today's Date: 04/17/2023 Time: 0920-0933 SLP Time Calculation (min) (ACUTE ONLY): 13 min  Assessment / Plan / Recommendation Clinical Impression  Pt seen for diet tolerance. Per SLP observation and nursing report, pt tolerating current diet without overt s/sx pharyngeal dysphagia. Recommend continuation of current diet with safe swallowing strategies/aspiration precautions as outlined below. SLP to sign off at this time.    HPI HPI: Pt is a 77 y.o. female with medical history significant for prior stroke, depression with anxiety, prior ESBL E. coli, who presents to the emergency room by EMS with altered mental status.  She was found after wellness check was requested after she had not been seen in 2 days.  She was found somnolent and confused in her bed and had unintelligible mumbling but without focal neurologic deficits.  She is reportedly oriented x 3 at baseline.  She was also noted by EMS to have foul-smelling urine ED course and data review:  COVID-positive.   Per her history, she was previously diagnosed with Mild Cognitive Impairment(MCI) but is able to speak in function with minimal issues.  She does occasionally display emotional lability and say inappropriate things at times however these have been ongoing issues.   CXR this admit: There has developed extensive airspace infiltrate throughout the  right lung in keeping with progressive pneumonic infiltrate. Left  lung is clear. No pneumothorax or pleural effusion. Cardiac size  within normal limits. Pulmonary vascularity is normal.      SLP Plan  All goals met      Recommendations for follow up therapy are one component of a multi-disciplinary discharge planning process, led by the attending physician.  Recommendations may be updated based on patient status, additional functional criteria and insurance  authorization.    Recommendations  Diet recommendations: Regular;Thin liquid Liquids provided via: Cup;Straw Medication Administration: Whole meds with puree Supervision: Patient able to self feed;Intermittent supervision to cue for compensatory strategies Compensations: Minimize environmental distractions;Slow rate;Small sips/bites;Lingual sweep for clearance of pocketing;Follow solids with liquid Postural Changes and/or Swallow Maneuvers: Out of bed for meals;Seated upright 90 degrees;Upright 30-60 min after meal                  Oral care BID;Oral care before and after PO;Staff/trained caregiver to provide oral care     Dysphagia, unspecified (R13.10)     All goals met    Clyde Canterbury, M.S., CCC-SLP Speech-Language Pathologist [facility] [phone] Arnette Felts)  Teresa Sherman  04/17/2023, 9:45 AM

## 2023-04-17 NOTE — Progress Notes (Signed)
[facility] CLINIC CARDIOLOGY PROGRESS NOTE   Patient ID: Teresa Sherman MRN: [id number] DOB/AGE: 1946/05/30 77 y.o.  Admit date: 04/11/2023 Referring Physician Dr Mayford Knife Primary Cardiologist none Reason for Consultation troponin elevation  HPI: Teresa Sherman is a 77 y.o. female  with a past medical history of CVA who presented to the ED on 04/11/2023 for shortness of breath.  Patient was found to have COVID-19 pneumonia with possible bacterial coinfection.  Cardiology was placed on consultation due to significantly elevated troponin.   Patient seen and examined at bedside, resting comfortably. No events overnight. She is still not having any chest pain. Her breathing continues to improve. Denies chest pain, shortness of breath, palpitations, diaphoresis, syncope, edema, PND, orthopnea.   Review of systems complete and found to be negative unless listed above    Vitals:   04/17/23 1000 04/17/23 1100 04/17/23 1200 04/17/23 1300  BP: 114/84 105/83 (!) 126/95 (!) 121/91  Pulse: (!) 112 98 (!) 106 (!) 103  Resp: (!) 25 (!) 25 (!) 22 19  Temp:   97.6 F (36.4 C)   TempSrc:   Oral   SpO2: (!) 89% 93% 90% 92%  Weight:      Height:         Intake/Output Summary (Last 24 hours) at 04/17/2023 1358 Last data filed at 04/17/2023 0800 Gross per 24 hour  Intake 930 ml  Output 754 ml  Net 176 ml     PHYSICAL EXAM General: awake, well nourished, in no acute distress. HEENT: Normocephalic and atraumatic. Neck: No JVD.  Lungs: Normal respiratory effort. Coarse bilaterally at bases to auscultation. No wheezes, crackles, rhonchi.  Heart: HRRR. Normal S1 and S2 without gallops or murmurs. Radial & DP pulses 2+ bilaterally. Abdomen: Non-distended appearing.  Msk: Normal strength and tone for age. Extremities: No clubbing, cyanosis or edema.   Neuro: Alert and oriented X 3. Psych: Mood appropriate, affect congruent.    LABS: Basic Metabolic Panel: Recent Labs     04/15/23 0540 04/15/23 2244 04/16/23 0435 04/17/23 0358  NA 139   < > 139 135  K 3.6   < > 3.4* 3.4*  CL 101   < > 104 104  CO2 24   < > 21* 23  GLUCOSE 83   < > 138* 151*  BUN 13   < > 13 20  CREATININE 0.52   < > 0.54 0.54  CALCIUM 7.7*   < > 7.8* 7.6*  MG 1.8  --  1.9  --   PHOS 2.5  --   --   --    < > = values in this interval not displayed.   Liver Function Tests: No results for input(s): "AST", "ALT", "ALKPHOS", "BILITOT", "PROT", "ALBUMIN" in the last 72 hours. No results for input(s): "LIPASE", "AMYLASE" in the last 72 hours. CBC: Recent Labs    04/15/23 2244 04/16/23 0435 04/17/23 0358  WBC 7.2 6.1 8.3  NEUTROABS 6.0  --   --   HGB 11.4* 11.8* 11.2*  HCT 32.5* 34.7* 32.5*  MCV 88.8 89.7 89.0  PLT 177 191 259   Cardiac Enzymes: Recent Labs    04/15/23 0540 04/15/23 2244 04/16/23 0117 04/17/23 0358  CKTOTAL 416*  --   --  314*  TROPONINIHS  --  3,734* 2,814*  --    BNP: No results for input(s): "BNP" in the last 72 hours. D-Dimer: Recent Labs    04/15/23 2244  DDIMER 3.42*   Hemoglobin A1C: No results for  input(s): "HGBA1C" in the last 72 hours. Fasting Lipid Panel: No results for input(s): "CHOL", "HDL", "LDLCALC", "TRIG", "CHOLHDL", "LDLDIRECT" in the last 72 hours. Thyroid Function Tests: No results for input(s): "TSH", "T4TOTAL", "T3FREE", "THYROIDAB" in the last 72 hours.  Invalid input(s): "FREET3" Anemia Panel: No results for input(s): "VITAMINB12", "FOLATE", "FERRITIN", "TIBC", "IRON", "RETICCTPCT" in the last 72 hours.  US Venous Img Lower Bilateral (DVT)  Result Date: 04/16/2023 CLINICAL DATA:  77 year old female with history of elevated D-dimer. EXAM: BILATERAL LOWER EXTREMITY VENOUS DOPPLER ULTRASOUND TECHNIQUE: Gray-scale sonography with graded compression, as well as color Doppler and duplex ultrasound were performed to evaluate the lower extremity deep venous systems from the level of the common femoral vein and including the  common femoral, femoral, profunda femoral, popliteal and calf veins including the posterior tibial, peroneal and gastrocnemius veins when visible. The superficial great saphenous vein was also interrogated. Spectral Doppler was utilized to evaluate flow at rest and with distal augmentation maneuvers in the common femoral, femoral and popliteal veins. COMPARISON:  None Available. FINDINGS: RIGHT LOWER EXTREMITY Common Femoral Vein: No evidence of thrombus. Normal compressibility, respiratory phasicity and response to augmentation. Saphenofemoral Junction: No evidence of thrombus. Normal compressibility and flow on color Doppler imaging. Profunda Femoral Vein: No evidence of thrombus. Normal compressibility and flow on color Doppler imaging. Femoral Vein: No evidence of thrombus. Normal compressibility, respiratory phasicity and response to augmentation. Popliteal Vein: No evidence of thrombus. Normal compressibility, respiratory phasicity and response to augmentation. Calf Veins: No evidence of thrombus. Normal compressibility and flow on color Doppler imaging. Superficial Great Saphenous Vein: No evidence of thrombus. Normal compressibility. Venous Reflux:  None. Other Findings:  None. LEFT LOWER EXTREMITY Common Femoral Vein: No evidence of thrombus. Normal compressibility, respiratory phasicity and response to augmentation. Saphenofemoral Junction: No evidence of thrombus. Normal compressibility and flow on color Doppler imaging. Profunda Femoral Vein: No evidence of thrombus. Normal compressibility and flow on color Doppler imaging. Femoral Vein: No evidence of thrombus. Normal compressibility, respiratory phasicity and response to augmentation. Popliteal Vein: No evidence of thrombus. Normal compressibility, respiratory phasicity and response to augmentation. Calf Veins: No evidence of thrombus. Normal compressibility and flow on color Doppler imaging. Superficial Great Saphenous Vein: No evidence of thrombus.  Normal compressibility. Venous Reflux:  None. Other Findings:  None. IMPRESSION: No evidence of deep venous thrombosis in either lower extremity. Electronically Signed   By: Trudie Reed M.D.   On: 04/16/2023 05:03   CT Angio Chest Pulmonary Embolism (PE) W or WO Contrast  Result Date: 04/16/2023 CLINICAL DATA:  Sepsis following COVID-19 infection with difficulty breathing, evaluate for pulmonary emboli EXAM: CT ANGIOGRAPHY CHEST WITH CONTRAST TECHNIQUE: Multidetector CT imaging of the chest was performed using the standard protocol during bolus administration of intravenous contrast. Multiplanar CT image reconstructions and MIPs were obtained to evaluate the vascular anatomy. RADIATION DOSE REDUCTION: This exam was performed according to the departmental dose-optimization program which includes automated exposure control, adjustment of the mA and/or kV according to patient size and/or use of iterative reconstruction technique. CONTRAST:  75mL OMNIPAQUE IOHEXOL 350 MG/ML SOLN COMPARISON:  Chest x-ray from the previous day. FINDINGS: Cardiovascular: Thoracic aorta shows normal branching pattern. No aneurysmal dilatation is noted. No cardiac enlargement is seen. Pulmonary artery is well visualized within normal branching pattern bilaterally. No filling defect to suggest pulmonary embolism is noted. Mediastinum/Nodes: Thoracic inlet is within normal limits. No hilar or mediastinal adenopathy is noted. The esophagus as visualized is within normal limits. Small sliding-type hiatal  hernia is noted. Lungs/Pleura: Small effusions are noted bilaterally right slightly greater than left. Patchy airspace opacities are noted in both lungs greater on the right than the left. No parenchymal nodules are seen. Upper Abdomen: Remainder of the upper abdomen is within normal limits. Musculoskeletal: No acute rib fractures are seen. T8, T11 and T12 compression fractures are noted which appear chronic in nature. Review of the MIP  images confirms the above findings. IMPRESSION: No evidence of pulmonary emboli. Bilateral effusions with patchy airspace opacities right greater than left. Old compression deformities as described. Electronically Signed   By: Alcide Clever M.D.   On: 04/16/2023 00:51   DG Chest Port 1 View  Result Date: 04/15/2023 CLINICAL DATA:  Shortness of breath.  Hypoxia.  COVID positive. EXAM: PORTABLE CHEST 1 VIEW COMPARISON:  Radiograph yesterday FINDINGS: Shifting bilateral pulmonary opacities, more extensive on the right. There is some improvement in the diffuse right lung opacity, however worsening in the perihilar and left basilar opacity. Stable heart size and mediastinal contours. Possible small pleural effusions. No pneumothorax. IMPRESSION: Shifting bilateral pulmonary opacities, more extensive on the right. There is some improvement in the diffuse right lung opacity, however worsening in the left perihilar and basilar opacity. Findings are suspicious for multifocal pneumonia. Electronically Signed   By: Narda Rutherford M.D.   On: 04/15/2023 22:41     ECHO pending  TELEMETRY reviewed by me 04/17/23: normal sinus rhythm, sinus tachycardia with rates in low 100s  EKG reviewed by me: normal sinus rhythm with rate of 83 bpm. Mildly prolonged Qtc. No evidence of ischemia.    ASSESSMENT AND PLAN:  Principal Problem:   Severe sepsis (HCC) Active Problems:   Depression with anxiety   History of stroke   CAP (community acquired pneumonia)   Pneumonia due to COVID-19 virus   Rhabdomyolysis   AKI (acute kidney injury) (HCC)   Acute metabolic encephalopathy   Urinary tract infection   Anemia   NSTEMI suspect Type II Presentation most consistent with demand/supply mismatch and not ACS. Inflammatory response to Covid could certainly increase her troponins to a higher degree. Trops down-trending. No EKG changes noted on telemetry. Echocardiogram is pending. Her BP is still too soft for GDMT.  Continue ASA 81 mg daily. If BP allows, would add Toprol XL 12.5 mg or 25 mg as BP allows. Recommend outpatient nuclear stress test given lack of anginal symptoms and no dynamic EKG changes.  Cardiology will sign off at this time, please reach out with any questions or concerns. Patient can follow-up with me in 1-2 weeks.     Covid-19 Pneumonia Management per primary/ICU     History of CVA Crestor 40 mg daily  Continue ASA 81 mg daily    Clotilde Dieter, DO 04/17/2023, 1:58 PM [facility] Cardiology

## 2023-04-18 ENCOUNTER — Inpatient Hospital Stay: Payer: Medicare HMO

## 2023-04-18 DIAGNOSIS — U071 COVID-19: Secondary | ICD-10-CM | POA: Diagnosis not present

## 2023-04-18 DIAGNOSIS — J9621 Acute and chronic respiratory failure with hypoxia: Secondary | ICD-10-CM

## 2023-04-18 DIAGNOSIS — A419 Sepsis, unspecified organism: Secondary | ICD-10-CM | POA: Diagnosis not present

## 2023-04-18 DIAGNOSIS — J1282 Pneumonia due to coronavirus disease 2019: Secondary | ICD-10-CM | POA: Diagnosis not present

## 2023-04-18 LAB — CULTURE, BLOOD (ROUTINE X 2)
Culture: NO GROWTH
Culture: NO GROWTH
Special Requests: ADEQUATE

## 2023-04-18 LAB — MAGNESIUM: Magnesium: 2.2 mg/dL (ref 1.7–2.4)

## 2023-04-18 LAB — BASIC METABOLIC PANEL
Anion gap: 11 (ref 5–15)
BUN: 30 mg/dL — ABNORMAL HIGH (ref 8–23)
CO2: 22 mmol/L (ref 22–32)
Calcium: 8.1 mg/dL — ABNORMAL LOW (ref 8.9–10.3)
Chloride: 107 mmol/L (ref 98–111)
Creatinine, Ser: 0.56 mg/dL (ref 0.44–1.00)
GFR, Estimated: >60 mL/min (ref >60)
Glucose, Bld: 161 mg/dL — ABNORMAL HIGH (ref 70–99)
Potassium: 3.8 mmol/L (ref 3.5–5.1)
Sodium: 140 mmol/L (ref 135–145)

## 2023-04-18 LAB — CK: Total CK: 132 U/L (ref 38–234)

## 2023-04-18 MED ORDER — FUROSEMIDE 10 MG/ML IJ SOLN
40.0000 mg | Freq: Once | INTRAMUSCULAR | Status: AC
  Administered 2023-04-18: 40 mg via INTRAVENOUS
  Filled 2023-04-18: qty 4

## 2023-04-18 MED ORDER — PIPERACILLIN-TAZOBACTAM 3.375 G IVPB 30 MIN: 3.3750 g | Freq: Four times a day (QID) | INTRAVENOUS | Status: DC

## 2023-04-18 MED ORDER — COLLAGENASE 250 UNIT/GM EX OINT
TOPICAL_OINTMENT | Freq: Every day | CUTANEOUS | Status: AC
  Administered 2023-04-23: 1 via TOPICAL
  Filled 2023-04-18 (×3): qty 30

## 2023-04-18 MED ORDER — ZINC SULFATE 220 (50 ZN) MG PO CAPS
220.0000 mg | ORAL_CAPSULE | Freq: Every day | ORAL | Status: DC
  Administered 2023-04-18 – 2023-04-28 (×11): 220 mg via ORAL
  Filled 2023-04-18 (×11): qty 1

## 2023-04-18 MED ORDER — ENOXAPARIN SODIUM 40 MG/0.4ML IJ SOSY
40.0000 mg | PREFILLED_SYRINGE | Freq: Every day | INTRAMUSCULAR | Status: DC
  Administered 2023-04-18 – 2023-04-21 (×4): 40 mg via SUBCUTANEOUS
  Filled 2023-04-18 (×4): qty 0.4

## 2023-04-18 MED ORDER — PIPERACILLIN-TAZOBACTAM 3.375 G IVPB
3.3750 g | Freq: Three times a day (TID) | INTRAVENOUS | Status: AC
  Administered 2023-04-18 – 2023-04-22 (×14): 3.375 g via INTRAVENOUS
  Filled 2023-04-18 (×14): qty 50

## 2023-04-18 MED ORDER — VITAMIN C 500 MG PO TABS
500.0000 mg | ORAL_TABLET | Freq: Two times a day (BID) | ORAL | Status: DC
  Administered 2023-04-18 – 2023-04-28 (×19): 500 mg via ORAL
  Filled 2023-04-18 (×20): qty 1

## 2023-04-18 MED ORDER — MELATONIN 5 MG PO TABS
5.0000 mg | ORAL_TABLET | Freq: Every day | ORAL | Status: DC
  Administered 2023-04-18 – 2023-04-25 (×9): 5 mg via ORAL
  Filled 2023-04-18 (×11): qty 1

## 2023-04-18 NOTE — Consult Note (Signed)
WOC Nurse Consult Note: Reason for Consult: pressure injury Wound type: Unstageable Pressure Injury; coccyx Pressure Injury POA: Yes Measurement: 5cm x 6cm x 0cm  Wound bed:100% black/grey soft eschar  Drainage (amount, consistency, odor) scant, no odor Periwound: intact  Dressing procedure/placement/frequency: 1. Cleanse sacral wound with saline, pat dry 2. Apply 1/4" thick layer of Santyl to the wound bed, top with saline moist gauze dressing.  3. Top with dry dressing, change daily  Add low air loss mattress for moisture management and pressure redistribution Add RD consultation for wound supplementation   Discussed POC with patient and bedside nurse.  Re consult if needed, will not follow at this time. Thanks  Azzam Mehra [employer], RN,CWOCN, CNS, CWON-AP [phone])

## 2023-04-18 NOTE — Progress Notes (Addendum)
PROGRESS NOTE    Teresa Sherman  WNU:[id number] DOB: 03-02-1946 DOA: 04/11/2023 [facility]    Assessment & Plan:   Principal Problem:   Severe sepsis (HCC) Active Problems:   CAP (community acquired pneumonia)   Pneumonia due to COVID-19 virus   Urinary tract infection   AKI (acute kidney injury) (HCC)   Rhabdomyolysis   Acute metabolic encephalopathy   Anemia   Depression with anxiety   History of stroke  Assessment and Plan: Severe sepsis: met criteria w/ tachycardia and tachypnea with soft blood pressure, AKI, AMS, lactic acidosis with elevated procalcitonin as per Dr. Lucianne Muss. Possibly secondary to COVID19 pneumonia vs bacterial co-infection vs UTI. Severe sepsis resolved    Unlikely bacteremia: 1/4 growing staph, likely containment. Repeat blood cxs NGTD  COVID19 pneumonia: w/ vs. possible co-bacterial infection. Pro-cal is elevated 4.12. Continue on bronchodilators & encourage incentive spirometry. Overnight BP dropped & oxygen demand increased so pt was moved to stepdown overnight 04/15/23 & moved back to floor 04/17/23. CTA chest neg for PE. Abxs changed to IV zosyn as CXR shows worsening multifocal pneumonia. S/p IV lasix x1 and will likely give another dose of lasix this afternoon   Acute hypoxic respiratory failure: found to be 78% on RA. Continue on supplemental oxygen and wean as tolerated, currently on HFNC,  NSTEMI: etiology unclear, possibly secondary to COVID19. >3,000 but trending down. Cardio on statin, aspirin. D/c IV heparin drip. BP and/or HR will not tolerate GDMT at this time as per cardio. Echo shows LVEF is severely depressed The proximal 1/4 of LV thickenes but the  rest of ventricle is severely hypokinetic / akinetiic Appearance of Takotsubo's cardiomyopathy. The left ventricle has severely decreased  function. Akinesis/dyskineiss of distal 2/3 of RV with aneurysmal dilitation in  mid region. Cardio signed off 04/17/23  R/o Dysphagia: started  back on regular diet 04/15/23 as per speech     UTI: hx pseudomonas and ESBL E. coli UTI 11/2021. Urine cx shows no growth. Completed abx course   Urinary retention: bladder scan showed 800 mL, Foley catheter inserted on 8/20. Will do a voiding trial prior to d/c    AKI : resolved   Rhabdomyolysis: unknown whether traumatic or not. CK WNL. Resolved.    Acute metabolic encephalopathy: likely secondary to above infections. CT head shows no acute intracranial findings. Pt has been pulling out her oxygen so mittens were placed on pt's hands. Mental status waxes and wanes    Anemia: resolved. Possibly dilutional vs lab error.   Hx of CVA: continue on statin, aspirin    Depression: severity unknown. Continue on home dose of trazodone    Constipation: continue on colace. Miralax prn     DVT prophylaxis: lovenox  Code Status: full  Family Communication: discussed pt's care w/ pt's daughter, Victorino Dike, and answered her questions  Disposition Plan: likely d/c to SNF   Level of care: Progressive Consultants:    Procedures:   Antimicrobials: zosyn   Subjective: Pt c/o being thirsty   Objective: Vitals:   04/18/23 0135 04/18/23 0320 04/18/23 0325 04/18/23 0752  BP:  110/86  (!) 117/97  Pulse:  100  (!) 114  Resp:  20    Temp:  98 F (36.7 C)  (!) 97.5 F (36.4 C)  TempSrc:  Oral  Oral  SpO2: 94% 90% 94% 93%  Weight:      Height:        Intake/Output Summary (Last 24 hours) at 04/18/2023 0851 Last data  filed at 04/18/2023 0700 Gross per 24 hour  Intake 549.41 ml  Output 365 ml  Net 184.41 ml   Filed Weights   04/11/23 1702 04/16/23 0000  Weight: 62.1 kg 58.6 kg    Examination:  General exam: appears uncomfortable   Respiratory system: diminished breath sounds b/l  Cardiovascular system: S1/S2+. No rubs or clicks  Gastrointestinal system: abd is soft, NT, ND & hypoactive bowel sounds  Central nervous system: alert & oriented. Moves all extremities Psychiatry:  judgement and insight appears labile. Flat mood and affect    Data Reviewed: I have personally reviewed following labs and imaging studies  CBC: Recent Labs  Lab 04/11/23 1704 04/12/23 0104 04/14/23 0215 04/15/23 0540 04/15/23 2244 04/16/23 0435 04/17/23 0358  WBC 8.1   < > 6.0 5.9 7.2 6.1 8.3  NEUTROABS 7.0  --   --   --  6.0  --   --   HGB 7.8*   < > 12.3 11.8* 11.4* 11.8* 11.2*  HCT 23.6*   < > 36.1 33.6* 32.5* 34.7* 32.5*  MCV 95.2   < > 90.3 88.2 88.8 89.7 89.0  PLT 101*   < > 136* 154 177 191 259   < > = values in this interval not displayed.   Basic Metabolic Panel: Recent Labs  Lab 04/13/23 0418 04/14/23 0215 04/14/23 2034 04/15/23 0540 04/15/23 2244 04/16/23 0435 04/17/23 0358 04/18/23 0645  NA 140 137  --  139 133* 139 135 140  K 3.0* 2.7*   < > 3.6 3.4* 3.4* 3.4* 3.8  CL 108 106  --  101 99 104 104 107  CO2 23 24  --  24 24 21* 23 22  GLUCOSE 91 92  --  83 143* 138* 151* 161*  BUN 23 16  --  13 15 13 20  30*  CREATININE 0.64 0.51  --  0.52 0.61 0.54 0.54 0.56  CALCIUM 7.5* 7.6*  --  7.7* 7.2* 7.8* 7.6* 8.1*  MG 2.2 2.1  --  1.8  --  1.9  --  2.2  PHOS 2.6 2.0*  --  2.5  --   --   --   --    < > = values in this interval not displayed.   GFR: Estimated Creatinine Clearance: 48.6 mL/min (by C-G formula based on SCr of 0.56 mg/dL). Liver Function Tests: Recent Labs  Lab 04/11/23 1704 04/12/23 0104  AST 45* 50*  ALT 23 21  ALKPHOS 63 52  BILITOT 1.2 0.9  PROT 7.6 6.6  ALBUMIN 3.4* 2.9*   No results for input(s): "LIPASE", "AMYLASE" in the last 168 hours. No results for input(s): "AMMONIA" in the last 168 hours. Coagulation Profile: Recent Labs  Lab 04/11/23 1704  INR 1.2   Cardiac Enzymes: Recent Labs  Lab 04/13/23 0418 04/14/23 0215 04/15/23 0540 04/17/23 0358 04/18/23 0645  CKTOTAL 1,439* 937* 416* 314* 132   BNP (last 3 results) No results for input(s): "PROBNP" in the last 8760 hours. HbA1C: No results for input(s): "HGBA1C"  in the last 72 hours. CBG: Recent Labs  Lab 04/15/23 2221 04/16/23 0047  GLUCAP 144* 136*   Lipid Profile: No results for input(s): "CHOL", "HDL", "LDLCALC", "TRIG", "CHOLHDL", "LDLDIRECT" in the last 72 hours. Thyroid Function Tests: No results for input(s): "TSH", "T4TOTAL", "FREET4", "T3FREE", "THYROIDAB" in the last 72 hours. Anemia Panel: No results for input(s): "VITAMINB12", "FOLATE", "FERRITIN", "TIBC", "IRON", "RETICCTPCT" in the last 72 hours. Sepsis Labs: Recent Labs  Lab 04/11/23 1700  04/11/23 1717 04/11/23 1918 04/12/23 0104 04/15/23 2301  PROCALCITON  --  7.73  --  4.12 2.25  LATICACIDVEN 2.4*  --  2.0*  --   --     Recent Results (from the past 240 hour(s))  Culture, blood (Routine x 2)     Status: Abnormal   Collection Time: 04/11/23  5:04 PM   Specimen: BLOOD  Result Value Ref Range Status   Specimen Description   Final    BLOOD RIGHT ANTECUBITAL Performed at [facility], [address], [place], [place] [address]    Special Requests   Final    BOTTLES DRAWN AEROBIC AND ANAEROBIC Blood Culture adequate volume Performed at [facility], [address] [place], [place] [address]    Culture  Setup Time   Final    AEROBIC BOTTLE ONLY GRAM POSITIVE COCCI CRITICAL RESULT CALLED TO, READ BACK BY AND VERIFIED WITH: TREY GREENWOOD 04/12/23 1151 MW    Culture (A)  Final    STAPHYLOCOCCUS HOMINIS THE SIGNIFICANCE OF ISOLATING THIS ORGANISM FROM A SINGLE SET OF BLOOD CULTURES WHEN MULTIPLE SETS ARE DRAWN IS UNCERTAIN. PLEASE NOTIFY THE MICROBIOLOGY DEPARTMENT WITHIN ONE WEEK IF SPECIATION AND SENSITIVITIES ARE REQUIRED. Performed at [facility], [address], [place], [place] [address]    Report Status 04/13/2023 FINAL  Final  Blood Culture ID Panel (Reflexed)     Status: Abnormal   Collection Time: 04/11/23  5:04 PM  Result Value Ref Range Status   Enterococcus faecalis NOT DETECTED NOT DETECTED Final   Enterococcus Faecium NOT  DETECTED NOT DETECTED Final   Listeria monocytogenes NOT DETECTED NOT DETECTED Final   Staphylococcus species DETECTED (A) NOT DETECTED Final    Comment: CRITICAL RESULT CALLED TO, READ BACK BY AND VERIFIED WITH: TREY GREENWOOD 04/12/23 1151 MW    Staphylococcus aureus (BCID) NOT DETECTED NOT DETECTED Final   Staphylococcus epidermidis NOT DETECTED NOT DETECTED Final   Staphylococcus lugdunensis NOT DETECTED NOT DETECTED Final   Streptococcus species NOT DETECTED NOT DETECTED Final   Streptococcus agalactiae NOT DETECTED NOT DETECTED Final   Streptococcus pneumoniae NOT DETECTED NOT DETECTED Final   Streptococcus pyogenes NOT DETECTED NOT DETECTED Final   A.calcoaceticus-baumannii NOT DETECTED NOT DETECTED Final   Bacteroides fragilis NOT DETECTED NOT DETECTED Final   Enterobacterales NOT DETECTED NOT DETECTED Final   Enterobacter cloacae complex NOT DETECTED NOT DETECTED Final   Escherichia coli NOT DETECTED NOT DETECTED Final   Klebsiella aerogenes NOT DETECTED NOT DETECTED Final   Klebsiella oxytoca NOT DETECTED NOT DETECTED Final   Klebsiella pneumoniae NOT DETECTED NOT DETECTED Final   Proteus species NOT DETECTED NOT DETECTED Final   Salmonella species NOT DETECTED NOT DETECTED Final   Serratia marcescens NOT DETECTED NOT DETECTED Final   Haemophilus influenzae NOT DETECTED NOT DETECTED Final   Neisseria meningitidis NOT DETECTED NOT DETECTED Final   Pseudomonas aeruginosa NOT DETECTED NOT DETECTED Final   Stenotrophomonas maltophilia NOT DETECTED NOT DETECTED Final   Candida albicans NOT DETECTED NOT DETECTED Final   Candida auris NOT DETECTED NOT DETECTED Final   Candida glabrata NOT DETECTED NOT DETECTED Final   Candida krusei NOT DETECTED NOT DETECTED Final   Candida parapsilosis NOT DETECTED NOT DETECTED Final   Candida tropicalis NOT DETECTED NOT DETECTED Final   Cryptococcus neoformans/gattii NOT DETECTED NOT DETECTED Final    Comment: Performed at [facility], [address], [place], [place] [address]  SARS Coronavirus 2 by RT PCR (hospital  order, performed in [facility] hospital lab) *cepheid single result test* Anterior Nasal Swab     Status: Abnormal   Collection Time: 04/11/23  5:16 PM   Specimen: Anterior Nasal Swab  Result Value Ref Range Status   SARS Coronavirus 2 by RT PCR POSITIVE (A) NEGATIVE Final    Comment: (NOTE) SARS-CoV-2 target nucleic acids are DETECTED  SARS-CoV-2 RNA is generally detectable in upper respiratory specimens  during the acute phase of infection.  Positive results are indicative  of the presence of the identified virus, but do not rule out bacterial infection or co-infection with other pathogens not detected by the test.  Clinical correlation with patient history and  other diagnostic information is necessary to determine patient infection status.  The expected result is negative.  Fact Sheet for Patients:   RoadLapTop.co.za   Fact Sheet for Healthcare Providers:   http://kim-miller.com/    This test is not yet approved or cleared by the [place] FDA and  has been authorized for detection and/or diagnosis of SARS-CoV-2 by FDA under an Emergency Use Authorization (EUA).  This EUA will remain in effect (meaning this test can be used) for the duration of  the COVID-19 declaration under Section 564(b)(1)  of the Act, 21 U.S.C. section 360-bbb-3(b)(1), unless the authorization is terminated or revoked sooner.   Performed at [facility], [address] [place], [place] [address]   Culture, blood (Routine x 2)     Status: None   Collection Time: 04/11/23  7:18 PM   Specimen: BLOOD  Result Value Ref Range Status   Specimen Description BLOOD BLOOD RIGHT ARM  Final   Special Requests   Final    BOTTLES DRAWN AEROBIC AND ANAEROBIC Blood Culture adequate volume   Culture   Final    NO GROWTH 5 DAYS Performed at [facility], [address], [place], [place] [address]    Report Status 04/16/2023 FINAL  Final  Urine Culture (for pregnant, neutropenic or urologic patients or patients with an indwelling urinary catheter)     Status: None   Collection Time: 04/12/23  7:30 PM   Specimen: Urine, Clean Catch  Result Value Ref Range Status   Specimen Description   Final    URINE, CLEAN CATCH Performed at [facility], [address], [place], [place] [address]    Special Requests   Final    NONE Performed at [facility], [address], [place], [place] [address]    Culture   Final    NO GROWTH Performed at [facility], [address] [place], [place] [address]    Report Status 04/13/2023 FINAL  Final  Gastrointestinal Panel by PCR , Stool     Status: None   Collection Time: 04/13/23  8:05 AM   Specimen: Stool  Result Value Ref Range Status   Campylobacter species NOT DETECTED NOT DETECTED Final   Plesimonas shigelloides NOT DETECTED NOT DETECTED Final   Salmonella species NOT DETECTED NOT DETECTED Final   Yersinia enterocolitica NOT DETECTED NOT DETECTED Final   Vibrio species NOT DETECTED NOT DETECTED Final   Vibrio cholerae NOT DETECTED NOT DETECTED Final   Enteroaggregative E coli (EAEC) NOT DETECTED NOT DETECTED Final   Enteropathogenic E coli (EPEC) NOT DETECTED NOT DETECTED Final   Enterotoxigenic E coli (ETEC) NOT DETECTED NOT DETECTED Final   Shiga like toxin producing E coli (STEC) NOT DETECTED NOT DETECTED Final   Shigella/Enteroinvasive E coli (EIEC) NOT  DETECTED NOT DETECTED Final   Cryptosporidium NOT DETECTED NOT DETECTED Final   Cyclospora cayetanensis NOT DETECTED NOT DETECTED Final   Entamoeba histolytica NOT DETECTED NOT DETECTED Final   Giardia lamblia NOT DETECTED NOT DETECTED Final   Adenovirus F40/41 NOT DETECTED NOT DETECTED Final   Astrovirus NOT DETECTED NOT DETECTED Final   Norovirus GI/GII NOT DETECTED NOT DETECTED Final   Rotavirus A NOT DETECTED NOT  DETECTED Final   Sapovirus (I, II, IV, and V) NOT DETECTED NOT DETECTED Final    Comment: Performed at [facility], [address], [place], [place] [address]  C Difficile Quick Screen w PCR reflex     Status: None   Collection Time: 04/13/23  8:05 AM   Specimen: STOOL  Result Value Ref Range Status   C Diff antigen NEGATIVE NEGATIVE Final   C Diff toxin NEGATIVE NEGATIVE Final   C Diff interpretation No C. difficile detected.  Final    Comment: Performed at [facility], [address] [place], [place] [address]  Culture, blood (Routine X 2) w Reflex to ID Panel     Status: None   Collection Time: 04/13/23  5:11 PM   Specimen: BLOOD  Result Value Ref Range Status   Specimen Description BLOOD BLOOD RIGHT ARM  Final   Special Requests   Final    BOTTLES DRAWN AEROBIC AND ANAEROBIC Blood Culture adequate volume   Culture   Final    NO GROWTH 5 DAYS Performed at [facility], [address] [place], [place] [address]    Report Status 04/18/2023 FINAL  Final  Culture, blood (Routine X 2) w Reflex to ID Panel     Status: None   Collection Time: 04/13/23  5:18 PM   Specimen: BLOOD  Result Value Ref Range Status   Specimen Description BLOOD BLOOD LEFT ARM  Final   Special Requests   Final    BOTTLES DRAWN AEROBIC AND ANAEROBIC Blood Culture results may not be optimal due to an inadequate volume of blood received in culture bottles   Culture   Final    NO GROWTH 5 DAYS Performed at [facility], [address] [place], [place] [address]    Report Status 04/18/2023 FINAL  Final  MRSA Next Gen by PCR, Nasal     Status: None   Collection Time: 04/16/23  1:33 AM   Specimen: Nasal Mucosa; Nasal Swab  Result Value Ref Range Status   MRSA by PCR Next Gen NOT DETECTED NOT DETECTED Final    Comment: (NOTE) The GeneXpert MRSA Assay (FDA approved for NASAL specimens only), is one component of a comprehensive MRSA colonization surveillance program.  It is not intended to diagnose MRSA infection nor to guide or monitor treatment for MRSA infections. Test performance is not FDA approved in patients less than 100 years old. Performed at [facility], [address] [place], [place] [address]          Radiology Studies: ECHOCARDIOGRAM COMPLETE BUBBLE STUDY  Result Date: 04/17/2023    ECHOCARDIOGRAM REPORT   Patient Name:   ABRIONA [facility] Date of Exam: 04/17/2023 Medical Rec #:  [id number]             Height:       60.0 in Accession #:    [id number]            Weight:       129.2 lb Date of Birth:  12-20-1945  BSA:          1.550 m Patient Age:    77 years              BP:           104/79 mmHg Patient Gender: F                     HR:           96 bpm. Exam Location:  [facility] Procedure: 2D Echo and Saline Contrast Bubble Study Indications:     NSTEMI  History:         Patient has no prior history of Echocardiogram examinations.  Sonographer:     Overton Mam RDCS, FASE Referring Phys:  [id number] BRENDA MORRISON Diagnosing Phys: Dietrich Pates MD IMPRESSIONS  1. LVEF is severely depressed The proximal 1/4 of LV thickenes but the rest of ventricle is severely hypokinetic / akinetiic Appearance of Takotsubo's cardiomyopathy.. The left ventricle has severely decreased function.  2. Akinesis/dyskineiss of distal 2/3 of RV with aneurysmal dilitation in mid region . Right ventricular systolic function is moderately reduced. The right ventricular size is normal.  3. The mitral valve is normal in structure. Mild mitral valve regurgitation.  4. The aortic valve is tricuspid. Aortic valve regurgitation is moderate.  5. The inferior vena cava is dilated in size with <50% respiratory variability, suggesting right atrial pressure of 15 mmHg.  6. Agitated saline contrast bubble study was negative, with no evidence of any interatrial shunt. FINDINGS  Left Ventricle: LVEF is severely depressed The proximal 1/4 of LV thickenes but the rest of  ventricle is severely hypokinetic / akinetiic Appearance of Takotsubo's cardiomyopathy. The left ventricle has severely decreased function. The left ventricular internal cavity size was normal in size. There is no left ventricular hypertrophy. Right Ventricle: Akinesis/dyskineiss of distal 2/3 of RV with aneurysmal dilitation in mid region. The right ventricular size is normal. Right vetricular wall thickness was not assessed. Right ventricular systolic function is moderately reduced. Left Atrium: Left atrial size was normal in size. Right Atrium: Right atrial size was normal in size. Pericardium: There is no evidence of pericardial effusion. Mitral Valve: The mitral valve is normal in structure. Mild mitral valve regurgitation. Tricuspid Valve: The tricuspid valve is normal in structure. Tricuspid valve regurgitation is trivial. Aortic Valve: The aortic valve is tricuspid. Aortic valve regurgitation is moderate. Aortic regurgitation PHT measures 465 msec. Aortic valve peak gradient measures 5.3 mmHg. Pulmonic Valve: The pulmonic valve was not well visualized. Pulmonic valve regurgitation is not visualized. Aorta: The aortic root is normal in size and structure. Venous: The inferior vena cava is dilated in size with less than 50% respiratory variability, suggesting right atrial pressure of 15 mmHg. IAS/Shunts: No atrial level shunt detected by color flow Doppler. Agitated saline contrast was given intravenously to evaluate for intracardiac shunting. Agitated saline contrast bubble study was negative, with no evidence of any interatrial shunt.  LEFT VENTRICLE PLAX 2D LVIDd:         4.20 cm     Diastology LVIDs:         3.10 cm     LV e' medial:    9.36 cm/s LV PW:         0.80 cm     LV E/e' medial:  7.8 LV IVS:        0.70 cm     LV e' lateral:   18.20 cm/s LVOT diam:  1.80 cm     LV E/e' lateral: 4.0 LV SV:         46 LV SV Index:   29 LVOT Area:     2.54 cm  LV Volumes (MOD) LV vol d, MOD A2C: 63.6 ml LV vol  d, MOD A4C: 70.9 ml LV vol s, MOD A2C: 40.7 ml LV vol s, MOD A4C: 39.8 ml LV SV MOD A2C:     22.9 ml LV SV MOD A4C:     70.9 ml LV SV MOD BP:      28.9 ml RIGHT VENTRICLE RV Basal diam:  2.50 cm LEFT ATRIUM             Index        RIGHT ATRIUM           Index LA diam:        2.40 cm 1.55 cm/m   RA Area:     10.20 cm LA Vol (A2C):   33.9 ml 21.87 ml/m  RA Volume:   21.50 ml  13.87 ml/m LA Vol (A4C):   37.8 ml 24.38 ml/m LA Biplane Vol: 35.7 ml 23.03 ml/m  AORTIC VALVE                 PULMONIC VALVE AV Area (Vmax): 2.30 cm     PV Vmax:       0.85 m/s AV Vmax:        115.00 cm/s  PV Peak grad:  2.9 mmHg AV Peak Grad:   5.3 mmHg LVOT Vmax:      104.00 cm/s LVOT Vmean:     67.700 cm/s LVOT VTI:       0.179 m AI PHT:         465 msec  AORTA Ao Root diam: 3.50 cm MITRAL VALVE MV Area (PHT): 7.90 cm    SHUNTS MV Decel Time: 96 msec     Systemic VTI:  0.18 m MV E velocity: 72.80 cm/s  Systemic Diam: 1.80 cm MV A velocity: 71.80 cm/s MV E/A ratio:  1.01 Dietrich Pates MD Electronically signed by Dietrich Pates MD Signature Date/Time: 04/17/2023/6:21:37 PM    Final         Scheduled Meds:  aspirin  81 mg Oral Daily   Chlorhexidine Gluconate Cloth  6 each Topical Daily   docusate sodium  200 mg Oral BID   donepezil  10 mg Oral QHS   feeding supplement ([employer] STANDARD 1.4)  325 mL Oral TID BM   guaiFENesin  15 mL Oral TID   melatonin  5 mg Oral QHS   methylPREDNISolone (SOLU-MEDROL) injection  40 mg Intravenous Q12H   multivitamin with minerals  1 tablet Oral Q lunch   rosuvastatin  40 mg Oral Q2200   saccharomyces boulardii  250 mg Oral BID   Continuous Infusions:  ampicillin-sulbactam (UNASYN) IV Stopped (04/18/23 0207)     LOS: 7 days    Time spent: 25 mins     Charise Killian, MD [facility] Pager [phone]  If 7PM-7AM, please contact night-coverage [email or web address] 04/18/2023, 8:51 AM

## 2023-04-18 NOTE — Progress Notes (Signed)
Nutrition Follow-up  DOCUMENTATION CODES:   Not applicable  INTERVENTION:   -Continue [employer] 1.4 PO TID, each supplement provides 455 kcal and 20 grams protein -Continue MVI with minerals daily -500 mg vitamin C BID -220 mg zinc sulfate daily x 14 days  NUTRITION DIAGNOSIS:   Increased nutrient needs related to acute illness (COVID-19) as evidenced by estimated needs.  Ongoing  GOAL:   Patient will meet greater than or equal to 90% of their needs  Progressing  MONITOR:   PO intake, Supplement acceptance  REASON FOR ASSESSMENT:   Consult Assessment of nutrition requirement/status  ASSESSMENT:   Pt with medical history significant for Prior stroke, depression with anxiety, prior ESBL E. coli, who presents with altered mental status.  8/23- s/p BSE- advanced to regular diet   Reviewed I/O's: +192 ml x 24 hours and +654 ml since admission  UOP: 365 ml x 24 hours   Per [facility]OCN notes, pt with unstageable pressure injury to coccyx.   Pt lying in bed at time of visit, agitated and confused. Pt shares that she is "chained up" and "all you want from me is my [employer]". Observed breakfast tray, whcih was untouched. Pt continued to fixate on how she could not do anything because she had mitts on. RD attempted to reorient to place and situation unsuccessfully.   Pt remains on a regular diet. Noted meal completions 75-95%.   Medications reviewed and include colace, melatonin, solu-medrol, and florastor.   Labs reviewed: CBGS: 136 (inpatient orders for glycemic control are none).    Diet Order:   Diet Order             Diet regular Room service appropriate? Yes with Assist; Fluid consistency: Thin  Diet effective now                   EDUCATION NEEDS:   Education needs have been addressed  Skin:  Skin Assessment: Skin Integrity Issues: Skin Integrity Issues:: Unstageable DTI: coccyx Unstageable: rt coccyx  Last BM:  04/18/23 (type 6)  Height:   Ht  Readings from Last 1 Encounters:  04/16/23 5' (1.524 m)    Weight:   Wt Readings from Last 1 Encounters:  04/16/23 58.6 kg    Ideal Body Weight:  45.5 kg  BMI:  Body mass index is 25.23 kg/m.  Estimated Nutritional Needs:   Kcal:  1850-2050  Protein:  100-115 grams  Fluid:  > 1.8 L    Levada Schilling, RD, LDN, CDCES Registered Dietitian II Certified Diabetes Care and Education Specialist Please refer to [facility] for RD and/or RD on-call/weekend/after hours pager

## 2023-04-18 NOTE — Progress Notes (Signed)
Noticed on telemetry monitor that patients oxygen levels were low.  Went into room and she had pulled off HFNC and was trying to stand up.  She was really working hard with her breathing and breathing 30/min.  HFNC put back on and patient put in mittens to prevent her from pulling off her oxygen.  MD notified about her work of breathing.

## 2023-04-18 NOTE — Plan of Care (Signed)
  Problem: Coping: Goal: Psychosocial and spiritual needs will be supported Outcome: Progressing   Problem: Fluid Volume: Goal: Hemodynamic stability will improve Outcome: Not Progressing   Problem: Clinical Measurements: Goal: Signs and symptoms of infection will decrease Outcome: Not Progressing   Problem: Respiratory: Goal: Ability to maintain adequate ventilation will improve Outcome: Not Progressing   Problem: Education: Goal: Knowledge of risk factors and measures for prevention of condition will improve Outcome: Not Progressing   Problem: Respiratory: Goal: Complications related to the disease process, condition or treatment will be avoided or minimized Outcome: Not Progressing   Problem: Education: Goal: Knowledge of General Education information will improve Description: Including pain rating scale, medication(s)/side effects and non-pharmacologic comfort measures Outcome: Not Progressing   Problem: Health Behavior/Discharge Planning: Goal: Ability to manage health-related needs will improve Outcome: Not Progressing

## 2023-04-18 NOTE — Progress Notes (Signed)
Physical Therapy Treatment Patient Details Name: Teresa Sherman MRN: [id number] DOB: 1946/02/12 Today's Date: 04/18/2023   History of Present Illness Teresa Sherman is a 77 y.o. female with medical history significant for Prior stroke, depression with anxiety, prior ESBL E. coli, who presents to the emergency room by EMS with altered mental status.  She was found after wellness check was requested after she had not been seen in 2 days. Admitted to hospital wiith sepsis and COVID + with concerns for UTI.    PT Comments  Patient agitated this date 2/2 mittens being placed this AM due to patient pulling off HHFNC. Patient oriented to self and place. Difficulty redirecting this date due to perseverating on mittens. Agreeable to sit EOB with one mitten to drink water. Required minA for bed mobility for LE management. Once finished with water, patient quickly returns to supine and states "what else am I supposed to do, you've got me handcuffed (mittens)." Attempted education on purpose of mittens but patient not receptive. Discharge plan remains appropriate.      If plan is discharge home, recommend the following: A lot of help with walking and/or transfers;A lot of help with bathing/dressing/bathroom;Assistance with cooking/housework;Direct supervision/assist for medications management;Direct supervision/assist for financial management;Assist for transportation;Help with stairs or ramp for entrance;Supervision due to cognitive status   Can travel by private vehicle     No  Equipment Recommendations  Other (comment) (TBD at next venue of care)    Recommendations for Other Services       Precautions / Restrictions Precautions Precautions: Fall Restrictions Weight Bearing Restrictions: No     Mobility  Bed Mobility Overal bed mobility: Needs Assistance Bed Mobility: Supine to Sit, Sit to Supine     Supine to sit: Min assist Sit to supine: Min assist   General bed mobility  comments: assist for LE management. Sat EOB to drink water    Transfers                   General transfer comment: patient difficult to redirect and wanting to return to supine due to "what else am I supposed to do, you've got me handcuffed (mittens)."    Ambulation/Gait                   Stairs             Wheelchair Mobility     Tilt Bed    Modified Rankin (Stroke Patients Only)       Balance Overall balance assessment: Needs assistance Sitting-balance support: No upper extremity supported, Feet supported Sitting balance-Leahy Scale: Fair                                      Cognition Arousal: Alert Behavior During Therapy: Agitated Overall Cognitive Status: Impaired/Different from baseline Area of Impairment: Orientation, Following commands, Safety/judgement                 Orientation Level: Disoriented to, Time, Situation     Following Commands: Follows one step commands inconsistently, Follows one step commands with increased time Safety/Judgement: Decreased awareness of safety, Decreased awareness of deficits     General Comments: agitated at mittens being placed this AM. Attempted education on purpose of mittens but patient not receptive. Follows commands inconsistently. Difficult to redirect this date        Exercises      General  Comments General comments (skin integrity, edema, etc.): On 40L HHFNC, spO2 >90%      Pertinent Vitals/Pain Pain Assessment Pain Assessment: No/denies pain    Home Living                          Prior Function            PT Goals (current goals can now be found in the care plan section) Acute Rehab PT Goals Patient Stated Goal: did not state PT Goal Formulation: Patient unable to participate in goal setting Time For Goal Achievement: 04/26/23 Potential to Achieve Goals: Fair Progress towards PT goals: Progressing toward goals    Frequency    Min  1X/week      PT Plan      Co-evaluation              AM-PAC PT "6 Clicks" Mobility   Outcome Measure  Help needed turning from your back to your side while in a flat bed without using bedrails?: A Little Help needed moving from lying on your back to sitting on the side of a flat bed without using bedrails?: A Little Help needed moving to and from a bed to a chair (including a wheelchair)?: A Lot Help needed standing up from a chair using your arms (e.g., wheelchair or bedside chair)?: A Lot Help needed to walk in hospital room?: Total Help needed climbing 3-5 steps with a railing? : Total 6 Click Score: 12    End of Session Equipment Utilized During Treatment: Oxygen Activity Tolerance: Treatment limited secondary to agitation Patient left: in bed;with call bell/phone within reach;with bed alarm set;with restraints reapplied Nurse Communication: Mobility status PT Visit Diagnosis: Unsteadiness on feet (R26.81);Muscle weakness (generalized) (M62.81);Other abnormalities of gait and mobility (R26.89);Difficulty in walking, not elsewhere classified (R26.2)     Time: 1050-1109 PT Time Calculation (min) (ACUTE ONLY): 19 min  Charges:    $Therapeutic Activity: 8-22 mins PT General Charges $$ ACUTE PT VISIT: 1 Visit                     Teresa Sherman, PT, DPT Physical Therapist - [facility]  [facility]    Teresa Sherman 04/18/2023, 11:43 AM

## 2023-04-18 NOTE — Progress Notes (Signed)
PHARMACY CONSULT NOTE - ELECTROLYTES  Pharmacy Consult for Electrolyte Monitoring and Replacement   Recent Labs: Height: 5' (152.4 cm) Weight: 58.6 kg (129 lb 3 oz) IBW/kg (Calculated) : 45.5 Estimated Creatinine Clearance: 48.6 mL/min (by C-G formula based on SCr of 0.54 mg/dL). Potassium (mmol/L)  Date Value  04/18/2023 3.8  11/26/2013 3.8   Magnesium (mg/dL)  Date Value  11/91/4782 2.2   Calcium (mg/dL)  Date Value  95/62/1308 8.1 (L)   Calcium, Total (mg/dL)  Date Value  65/78/4696 9.5   Albumin (g/dL)  Date Value  29/52/8413 2.9 (L)  04/30/2015 4.3  11/26/2013 3.9   Phosphorus (mg/dL)  Date Value  24/40/1027 2.5   Sodium (mmol/L)  Date Value  04/18/2023 140  04/30/2015 140  11/26/2013 142   Corrected Ca: 8.6 mg/dL  Assessment  Teresa Sherman is a 77 y.o. female presenting with Severe sepsis secondary to pneumonia . PMH significant for CVA, Anemia, and depression. Pharmacy has been consulted to monitor and replace electrolytes.  Goal of Therapy: Electrolytes WNL  Plan:  No additional supplementation needed at this time Check BMP with AM labs  Thank you for involving pharmacy in this patient's care.   Paulita Fujita, PharmD Clinical Pharmacist 04/18/2023 7:22 AM

## 2023-04-18 NOTE — Progress Notes (Signed)
Patient sating 83% with increased work of breathing on 40L 75% HFNC. Increased to 50L and 100% and the highest sat obtained was 87%. Placed patient on bipap 14/6 and was able to decrease FIO2 to 60% with a sat of 95%.

## 2023-04-19 DIAGNOSIS — J1282 Pneumonia due to coronavirus disease 2019: Secondary | ICD-10-CM | POA: Diagnosis not present

## 2023-04-19 DIAGNOSIS — J9621 Acute and chronic respiratory failure with hypoxia: Secondary | ICD-10-CM | POA: Diagnosis present

## 2023-04-19 DIAGNOSIS — U071 COVID-19: Secondary | ICD-10-CM | POA: Diagnosis not present

## 2023-04-19 LAB — CBC
HCT: 31 % — ABNORMAL LOW (ref 36.0–46.0)
Hemoglobin: 10.6 g/dL — ABNORMAL LOW (ref 12.0–15.0)
MCH: 30.9 pg (ref 26.0–34.0)
MCHC: 34.2 g/dL (ref 30.0–36.0)
MCV: 90.4 fL (ref 80.0–100.0)
Platelets: 262 10*3/uL (ref 150–400)
RBC: 3.43 MIL/uL — ABNORMAL LOW (ref 3.87–5.11)
RDW: 14.5 % (ref 11.5–15.5)
WBC: 9.1 10*3/uL (ref 4.0–10.5)
nRBC: 0 % (ref 0.0–0.2)

## 2023-04-19 LAB — BASIC METABOLIC PANEL
Anion gap: 10 (ref 5–15)
BUN: 37 mg/dL — ABNORMAL HIGH (ref 8–23)
CO2: 26 mmol/L (ref 22–32)
Calcium: 7.9 mg/dL — ABNORMAL LOW (ref 8.9–10.3)
Chloride: 103 mmol/L (ref 98–111)
Creatinine, Ser: 0.63 mg/dL (ref 0.44–1.00)
GFR, Estimated: >60 mL/min (ref >60)
Glucose, Bld: 154 mg/dL — ABNORMAL HIGH (ref 70–99)
Potassium: 3.4 mmol/L — ABNORMAL LOW (ref 3.5–5.1)
Sodium: 139 mmol/L (ref 135–145)

## 2023-04-19 LAB — GLUCOSE, CAPILLARY: Glucose-Capillary: 145 mg/dL — ABNORMAL HIGH (ref 70–99)

## 2023-04-19 MED ORDER — POTASSIUM CHLORIDE 10 MEQ/100ML IV SOLN
10.0000 meq | INTRAVENOUS | Status: AC
  Administered 2023-04-19: 10 meq via INTRAVENOUS
  Filled 2023-04-19: qty 100

## 2023-04-19 MED ORDER — POTASSIUM CHLORIDE 10 MEQ/100ML IV SOLN
10.0000 meq | Freq: Once | INTRAVENOUS | Status: AC
  Administered 2023-04-19: 10 meq via INTRAVENOUS
  Filled 2023-04-19: qty 100

## 2023-04-19 MED ORDER — FUROSEMIDE 10 MG/ML IJ SOLN
40.0000 mg | Freq: Once | INTRAMUSCULAR | Status: AC
  Administered 2023-04-19: 40 mg via INTRAVENOUS
  Filled 2023-04-19: qty 4

## 2023-04-19 NOTE — Progress Notes (Signed)
Critical care note:  Date of note: 04/18/2023.  Subjective: This evening the patient has been struggling to keep her sats above 85-86 percent.  Edema and dropped to 83% on 40 L 75% high flow nasal cannula.  She was increased to 50 L and 100% and the highest sats obtained was 87%.  She has been having worsening respiratory distress.  No fever or chills.  No nausea or vomiting or abdominal pain.  No chest pain or palpitations.  She was admitted with severe sepsis and COVID-19 pneumonia with acute hypoxic respiratory failure and non-STEMI.  Notes and labs were reviewed.  Objective: Physical examination: Generally: Acutely ill elderly Caucasian female in moderate respiratory distress.  She was initially on high flow nasal cannula switched to BiPAP. Vital signs per history of present illness. Head - atraumatic, normocephalic.  Pupils - equal, round and reactive to light and accommodation. Extraocular movements are intact. No scleral icterus.  Oropharynx - moist mucous membranes and tongue. No pharyngeal erythema or exudate.  Neck - supple. No JVD. Carotid pulses 2+ bilaterally. No carotid bruits. No palpable thyromegaly or lymphadenopathy. Cardiovascular - regular rate and rhythm. Normal S1 and S2. No murmurs, gallops or rubs.  Lungs -Diminished bibasilar breath sounds with bibasal crackles. Abdomen - soft and nontender. Positive bowel sounds. No palpable organomegaly or masses.  Extremities - no pitting edema, clubbing or cyanosis.  Neuro - grossly non-focal. Skin - no rashes. Breast, pelvic and rectal - deferred.  Stat portable chest x-ray showed diffuse bilateral airspace opacities slightly improved when compared to prior exam.  Assessment/plan: 1.  Acute on chronic hypoxic respiratory failure due to COVID-19 with associated severe sepsis. - The patient was placed on BiPAP. - She was transferred to stepdown unit bed for closer monitor during. - We will continue current IV antibiotic  therapy. - O2 protocol will be followed to keep sats above 93%. - Bronchodilator therapy will be needed. - The case was discussed with the ICU team who will be on standby.  - We will continue to closely monitor her.  Authorized and performed by: Valente David, MD Total critical care time:   35  minutes. Due to a high probability of clinically significant, life-threatening deterioration, the patient required my highest level of preparedness to intervene emergently and I personally spent this critical care time directly and personally managing the patient.  This critical care time included obtaining a history, examining the patient, pulse oximetry, ordering and review of studies, arranging urgent treatment with development of management plan, evaluation of patient's response to treatment, frequent reassessment, and discussions with other providers. This critical care time was performed to assess and manage the high probability of imminent, life-threatening deterioration that could result in multiorgan failure.  It was exclusive of separately billable procedures and treating other patients and teaching time.

## 2023-04-19 NOTE — Progress Notes (Signed)
PROGRESS NOTE   HPI was taken from Dr. Para March: Lance Coon Teresa Sherman is a 77 y.o. female with medical history significant for Prior stroke, depression with anxiety, prior ESBL E. coli, who presents to the emergency room by EMS with altered mental status.  She was found after wellness check was requested after she had not been seen in 2 days.  She was found somnolent and confused in her bed and had unintelligible mumbling but without focal neurologic deficits.  She is reportedly oriented x 3 at baseline.  She was also noted by EMS to have foul-smelling urine ED course and data review: Normotensive tachycardic to 115, tachypneic to 23 with O2 sat 90 to 95% on room air, BP 101/83 improving to 126/87 with IV fluids Labs with normal WBC of 8.1 but lactic acid 2.4 and procalcitonin 7.73.  Hemoglobin noted to be 7.8 with baseline around 13 a year ago.  CMP notable for creatinine of 1.78, up from 0.51 a year ago, AST/ALT 45/23.  COVID-positive.  CK14 38.G, EKG personally viewed and interpreted showing sinus tachycardia at 111 with no acute ST-T wave changes. Chest x-ray with possible right lower lobe airspace opacity as further detailed below: IMPRESSION: Right lower lung zone airspace opacity. Followup PA and lateral chest X-ray is recommended in 3-4 weeks following trial of antibiotic therapy to ensure resolution and exclude underlying malignancy.   Patient started on sepsis fluids and given Rocephin for suspected UTI Hospitalist consulted for admission.   As per Dr. Mayford Knife 8/21-8/27/24: Pt was found to have severe sepsis secondary to COVID19 pneumonia & possible co-bacterial infection. Completed IV rocephin, azithromycin x 5 days but then restarted on unasyn which was then changed to IV zosyn as pt has been moved into stepdown x 2 overnight this admission so far. Pt was placed on BiPAP overnight (last night) and is currently on HFNC. Discussed code status w/ pt and she wants to continue full code.      Teresa Sherman  BMW:[id number] DOB: 05/27/1946 DOA: 04/11/2023 [facility]    Assessment & Plan:   Principal Problem:   Severe sepsis (HCC) Active Problems:   CAP (community acquired pneumonia)   Pneumonia due to COVID-19 virus   Urinary tract infection   AKI (acute kidney injury) (HCC)   Rhabdomyolysis   Acute metabolic encephalopathy   Anemia   Depression with anxiety   History of stroke   Acute on chronic respiratory failure with hypoxia (HCC)  Assessment and Plan: Severe sepsis: met criteria w/ tachycardia and tachypnea with soft blood pressure, AKI, AMS, lactic acidosis with elevated procalcitonin as per Dr. Lucianne Muss. Possibly secondary to COVID19 pneumonia vs bacterial co-infection vs UTI. Severe sepsis resolved    Unlikely bacteremia: 1/4 growing staph hominis, likely containment. Repeat blood cxs NGTD  COVID19 pneumonia: w/ vs. possible co-bacterial infection. Pro-cal is elevated 4.12. Continue on bronchodilators & encourage incentive spirometry. Overnight BP dropped & oxygen demand increased so pt was moved to stepdown overnight 04/15/23 & moved back to floor 04/17/23 and then moved back to stepdown overnight on 04/18/23. CTA chest neg for PE. Continue on IV zosyn as repeat XR on 04/18/23 showed worsening multifocal pneumonia. S/p IV lasix x 1 today.   Acute hypoxic respiratory failure: found to be 78% on RA. Was placed on BiPAP overnight 04/18/23 and currently on HFNC, wean as tolerated  NSTEMI: etiology unclear, possibly secondary to COVID19. >3,000 but trending down. Cardio on statin, aspirin. D/c IV heparin drip. BP & HR will not  tolerate GDMT as per cardio.  Echo shows LVEF is severely depressed The proximal 1/4 of LV thickenes but the  rest of ventricle is severely hypokinetic / akinetiic Appearance of Takotsubo's cardiomyopathy. The left ventricle has severely decreased  function. Akinesis/dyskineiss of distal 2/3 of RV with aneurysmal dilitation in mid region.  Cardio signed off 04/17/23  R/o Dysphagia: started back on regular diet 04/15/23 as per speech     UTI: hx pseudomonas and ESBL E. coli UTI 11/2021. Urine cx shows no growth. Completed abx course   Urinary retention: bladder scan showed 800 mL, Foley catheter inserted on 8/20. Will do a voiding trial prior to d/c   AKI : resolved   Rhabdomyolysis: unknown whether traumatic or not. CK WNL. Resolved.    Acute metabolic encephalopathy: likely secondary to above infections. CT head shows no acute intracranial findings. Pt is no longer pulling off her supplemental oxygen. Mental status waxes and wanes    Normocytic anemia: H&H are labile. No need for a transfusion currently    Hx of CVA: continue on aspirin, statin    Depression: severity unknown. Continue on home dose of trazodone    Constipation: continue on colace. Miralax prn      DVT prophylaxis: lovenox  Code Status: full  Family Communication: discussed pt's care w/ pt's daughter, Teresa Sherman, and answered her questions  Disposition Plan: likely d/c to SNF   Level of care: Stepdown Consultants:    Procedures:   Antimicrobials: zosyn   Subjective: Pt c/o malaise   Objective: Vitals:   04/19/23 0300 04/19/23 0400 04/19/23 0500 04/19/23 0725  BP: 102/73 98/77 100/76   Pulse: 99 88 98 89  Resp: (!) 26 (!) 22 (!) 21 (!) 23  Temp: (!) 97.4 F (36.3 C)     TempSrc: Axillary     SpO2: 96% 96% 96% 100%  Weight:      Height:        Intake/Output Summary (Last 24 hours) at 04/19/2023 0810 Last data filed at 04/19/2023 0300 Gross per 24 hour  Intake 331.1 ml  Output 1310 ml  Net -978.9 ml   Filed Weights   04/11/23 1702 04/16/23 0000 04/19/23 0027  Weight: 62.1 kg 58.6 kg 56.6 kg    Examination:  General exam: appears calm but uncomfortable  Respiratory system: course breath sounds b/l  Cardiovascular system: S1 & S2+. No rubs or clicks  Gastrointestinal system: abd is soft, NT, ND & hypoactive bowel  sounds Central nervous system: alert and oriented. Moves all extremities  Psychiatry: judgement and insight appears improved. Flat mood and affect    Data Reviewed: I have personally reviewed following labs and imaging studies  CBC: Recent Labs  Lab 04/15/23 0540 04/15/23 2244 04/16/23 0435 04/17/23 0358 04/19/23 0422  WBC 5.9 7.2 6.1 8.3 9.1  NEUTROABS  --  6.0  --   --   --   HGB 11.8* 11.4* 11.8* 11.2* 10.6*  HCT 33.6* 32.5* 34.7* 32.5* 31.0*  MCV 88.2 88.8 89.7 89.0 90.4  PLT 154 177 191 259 262   Basic Metabolic Panel: Recent Labs  Lab 04/13/23 0418 04/14/23 0215 04/14/23 2034 04/15/23 0540 04/15/23 2244 04/16/23 0435 04/17/23 0358 04/18/23 0645 04/19/23 0422  NA 140 137  --  139 133* 139 135 140 139  K 3.0* 2.7*   < > 3.6 3.4* 3.4* 3.4* 3.8 3.4*  CL 108 106  --  101 99 104 104 107 103  CO2 23 24  --  24 24  21* 23 22 26   GLUCOSE 91 92  --  83 143* 138* 151* 161* 154*  BUN 23 16  --  13 15 13 20  30* 37*  CREATININE 0.64 0.51  --  0.52 0.61 0.54 0.54 0.56 0.63  CALCIUM 7.5* 7.6*  --  7.7* 7.2* 7.8* 7.6* 8.1* 7.9*  MG 2.2 2.1  --  1.8  --  1.9  --  2.2  --   PHOS 2.6 2.0*  --  2.5  --   --   --   --   --    < > = values in this interval not displayed.   GFR: Estimated Creatinine Clearance: 47.9 mL/min (by C-G formula based on SCr of 0.63 mg/dL). Liver Function Tests: No results for input(s): "AST", "ALT", "ALKPHOS", "BILITOT", "PROT", "ALBUMIN" in the last 168 hours.  No results for input(s): "LIPASE", "AMYLASE" in the last 168 hours. No results for input(s): "AMMONIA" in the last 168 hours. Coagulation Profile: No results for input(s): "INR", "PROTIME" in the last 168 hours.  Cardiac Enzymes: Recent Labs  Lab 04/13/23 0418 04/14/23 0215 04/15/23 0540 04/17/23 0358 04/18/23 0645  CKTOTAL 1,439* 937* 416* 314* 132   BNP (last 3 results) No results for input(s): "PROBNP" in the last 8760 hours. HbA1C: No results for input(s): "HGBA1C" in the last  72 hours. CBG: Recent Labs  Lab 04/15/23 2221 04/16/23 0047 04/19/23 0015  GLUCAP 144* 136* 145*   Lipid Profile: No results for input(s): "CHOL", "HDL", "LDLCALC", "TRIG", "CHOLHDL", "LDLDIRECT" in the last 72 hours. Thyroid Function Tests: No results for input(s): "TSH", "T4TOTAL", "FREET4", "T3FREE", "THYROIDAB" in the last 72 hours. Anemia Panel: No results for input(s): "VITAMINB12", "FOLATE", "FERRITIN", "TIBC", "IRON", "RETICCTPCT" in the last 72 hours. Sepsis Labs: Recent Labs  Lab 04/15/23 2301  PROCALCITON 2.25    Recent Results (from the past 240 hour(s))  Culture, blood (Routine x 2)     Status: Abnormal   Collection Time: 04/11/23  5:04 PM   Specimen: BLOOD  Result Value Ref Range Status   Specimen Description   Final    BLOOD RIGHT ANTECUBITAL Performed at [facility], [address], [place], [place] [address]    Special Requests   Final    BOTTLES DRAWN AEROBIC AND ANAEROBIC Blood Culture adequate volume Performed at [facility], [address] [place], [place] [address]    Culture  Setup Time   Final    AEROBIC BOTTLE ONLY GRAM POSITIVE COCCI CRITICAL RESULT CALLED TO, READ BACK BY AND VERIFIED WITH: TREY GREENWOOD 04/12/23 1151 MW    Culture (A)  Final    STAPHYLOCOCCUS HOMINIS THE SIGNIFICANCE OF ISOLATING THIS ORGANISM FROM A SINGLE SET OF BLOOD CULTURES WHEN MULTIPLE SETS ARE DRAWN IS UNCERTAIN. PLEASE NOTIFY THE MICROBIOLOGY DEPARTMENT WITHIN ONE WEEK IF SPECIATION AND SENSITIVITIES ARE REQUIRED. Performed at [facility], [address] [place], [place] [address]    Report Status 04/13/2023 FINAL  Final  Blood Culture ID Panel (Reflexed)     Status: Abnormal   Collection Time: 04/11/23  5:04 PM  Result Value Ref Range Status   Enterococcus faecalis NOT DETECTED NOT DETECTED Final   Enterococcus Faecium NOT DETECTED NOT DETECTED Final   Listeria monocytogenes NOT DETECTED NOT DETECTED Final   Staphylococcus species  DETECTED (A) NOT DETECTED Final    Comment: CRITICAL RESULT CALLED TO, READ BACK BY AND VERIFIED WITH: TREY GREENWOOD 04/12/23 1151 MW    Staphylococcus aureus (BCID) NOT DETECTED  NOT DETECTED Final   Staphylococcus epidermidis NOT DETECTED NOT DETECTED Final   Staphylococcus lugdunensis NOT DETECTED NOT DETECTED Final   Streptococcus species NOT DETECTED NOT DETECTED Final   Streptococcus agalactiae NOT DETECTED NOT DETECTED Final   Streptococcus pneumoniae NOT DETECTED NOT DETECTED Final   Streptococcus pyogenes NOT DETECTED NOT DETECTED Final   A.calcoaceticus-baumannii NOT DETECTED NOT DETECTED Final   Bacteroides fragilis NOT DETECTED NOT DETECTED Final   Enterobacterales NOT DETECTED NOT DETECTED Final   Enterobacter cloacae complex NOT DETECTED NOT DETECTED Final   Escherichia coli NOT DETECTED NOT DETECTED Final   Klebsiella aerogenes NOT DETECTED NOT DETECTED Final   Klebsiella oxytoca NOT DETECTED NOT DETECTED Final   Klebsiella pneumoniae NOT DETECTED NOT DETECTED Final   Proteus species NOT DETECTED NOT DETECTED Final   Salmonella species NOT DETECTED NOT DETECTED Final   Serratia marcescens NOT DETECTED NOT DETECTED Final   Haemophilus influenzae NOT DETECTED NOT DETECTED Final   Neisseria meningitidis NOT DETECTED NOT DETECTED Final   Pseudomonas aeruginosa NOT DETECTED NOT DETECTED Final   Stenotrophomonas maltophilia NOT DETECTED NOT DETECTED Final   Candida albicans NOT DETECTED NOT DETECTED Final   Candida auris NOT DETECTED NOT DETECTED Final   Candida glabrata NOT DETECTED NOT DETECTED Final   Candida krusei NOT DETECTED NOT DETECTED Final   Candida parapsilosis NOT DETECTED NOT DETECTED Final   Candida tropicalis NOT DETECTED NOT DETECTED Final   Cryptococcus neoformans/gattii NOT DETECTED NOT DETECTED Final    Comment: Performed at [facility], [address] [place], [place] [address]  SARS Coronavirus 2 by RT PCR (hospital order, performed in [facility] hospital lab) *cepheid single result test* Anterior Nasal Swab     Status: Abnormal   Collection Time: 04/11/23  5:16 PM   Specimen: Anterior Nasal Swab  Result Value Ref Range Status   SARS Coronavirus 2 by RT PCR POSITIVE (A) NEGATIVE Final    Comment: (NOTE) SARS-CoV-2 target nucleic acids are DETECTED  SARS-CoV-2 RNA is generally detectable in upper respiratory specimens  during the acute phase of infection.  Positive results are indicative  of the presence of the identified virus, but do not rule out bacterial infection or co-infection with other pathogens not detected by the test.  Clinical correlation with patient history and  other diagnostic information is necessary to determine patient infection status.  The expected result is negative.  Fact Sheet for Patients:   RoadLapTop.co.za   Fact Sheet for Healthcare Providers:   http://kim-miller.com/    This test is not yet approved or cleared by the [place] FDA and  has been authorized for detection and/or diagnosis of SARS-CoV-2 by FDA under an Emergency Use Authorization (EUA).  This EUA will remain in effect (meaning this test can be used) for the duration of  the COVID-19 declaration under Section 564(b)(1)  of the Act, 21 U.S.C. section 360-bbb-3(b)(1), unless the authorization is terminated or revoked sooner.   Performed at [facility], [address] [place], [place] [address]   Culture, blood (Routine x 2)     Status: None   Collection Time: 04/11/23  7:18 PM   Specimen: BLOOD  Result Value Ref Range Status   Specimen Description BLOOD BLOOD RIGHT ARM  Final   Special Requests   Final    BOTTLES DRAWN AEROBIC AND ANAEROBIC Blood Culture adequate volume   Culture   Final    NO GROWTH 5 DAYS Performed at [facility], [address], [place], [place] [address]    Report Status 04/16/2023 FINAL  Final  Urine Culture (for pregnant,  neutropenic or urologic patients or patients with an indwelling urinary catheter)     Status: None   Collection Time: 04/12/23  7:30 PM   Specimen: Urine, Clean Catch  Result Value Ref Range Status   Specimen Description   Final    URINE, CLEAN CATCH Performed at [facility], [address], [place], [place] [address]    Special Requests   Final    NONE Performed at [facility], [address], [place], [place] [address]    Culture   Final    NO GROWTH Performed at [facility], [address] [place], [place] [address]    Report Status 04/13/2023 FINAL  Final  Gastrointestinal Panel by PCR , Stool     Status: None   Collection Time: 04/13/23  8:05 AM   Specimen: Stool  Result Value Ref Range Status   Campylobacter species NOT DETECTED NOT DETECTED Final   Plesimonas shigelloides NOT DETECTED NOT DETECTED Final   Salmonella species NOT DETECTED NOT DETECTED Final   Yersinia enterocolitica NOT DETECTED NOT DETECTED Final   Vibrio species NOT DETECTED NOT DETECTED Final   Vibrio cholerae NOT DETECTED NOT DETECTED Final   Enteroaggregative E coli (EAEC) NOT DETECTED NOT DETECTED Final   Enteropathogenic E coli (EPEC) NOT DETECTED NOT DETECTED Final   Enterotoxigenic E coli (ETEC) NOT DETECTED NOT DETECTED Final   Shiga like toxin producing E coli (STEC) NOT DETECTED NOT DETECTED Final   Shigella/Enteroinvasive E coli (EIEC) NOT DETECTED NOT DETECTED Final   Cryptosporidium NOT DETECTED NOT DETECTED Final   Cyclospora cayetanensis NOT DETECTED NOT DETECTED Final   Entamoeba histolytica NOT DETECTED NOT DETECTED Final   Giardia lamblia NOT DETECTED NOT DETECTED Final   Adenovirus F40/41 NOT DETECTED NOT DETECTED Final   Astrovirus NOT DETECTED NOT DETECTED Final   Norovirus GI/GII NOT DETECTED NOT DETECTED Final   Rotavirus A NOT DETECTED NOT DETECTED Final   Sapovirus (I, II, IV, and V) NOT DETECTED NOT DETECTED Final    Comment: Performed at [facility], [address] [place], [place] [address]  C Difficile Quick Screen w PCR reflex     Status: None   Collection Time: 04/13/23  8:05 AM   Specimen: STOOL  Result Value Ref Range Status   C Diff antigen NEGATIVE NEGATIVE Final   C Diff toxin NEGATIVE NEGATIVE Final   C Diff interpretation No C. difficile detected.  Final    Comment: Performed at [facility], [address] [place], [place] [address]  Culture, blood (Routine X 2) w Reflex to ID Panel     Status: None   Collection Time: 04/13/23  5:11 PM   Specimen: BLOOD  Result Value Ref Range Status   Specimen Description BLOOD BLOOD RIGHT ARM  Final   Special Requests   Final    BOTTLES DRAWN AEROBIC AND ANAEROBIC Blood Culture adequate volume   Culture   Final    NO GROWTH 5 DAYS Performed at [facility], [address] [place], [place] [address]    Report Status 04/18/2023 FINAL  Final  Culture, blood (Routine X 2) w Reflex to ID Panel     Status: None   Collection Time: 04/13/23  5:18 PM   Specimen: BLOOD  Result Value Ref Range Status   Specimen Description BLOOD BLOOD LEFT ARM  Final   Special Requests   Final    BOTTLES DRAWN AEROBIC AND ANAEROBIC Blood Culture results may not be optimal due to an inadequate volume of blood received in culture bottles   Culture   Final    NO GROWTH 5 DAYS Performed at [facility], [address] [place], [place] [address]    Report Status 04/18/2023 FINAL  Final  MRSA Next Gen by PCR, Nasal     Status: None   Collection Time: 04/16/23  1:33 AM   Specimen: Nasal Mucosa; Nasal Swab  Result Value Ref Range Status   MRSA by PCR Next Gen NOT DETECTED NOT DETECTED Final    Comment: (NOTE) The GeneXpert MRSA Assay (FDA approved for NASAL specimens only), is one component of a comprehensive MRSA colonization surveillance program. It is not intended to diagnose MRSA infection nor to guide or monitor treatment for MRSA infections. Test  performance is not FDA approved in patients less than 33 years old. Performed at [facility], [address] [place], [place] [address]          Radiology Studies: DG Chest Coleman 1 View  Result Date: 04/19/2023 CLINICAL DATA:  Cough and hypoxia, COVID-19 positivity, initial encounter EXAM: PORTABLE CHEST 1 VIEW COMPARISON:  Film from earlier in the same day. FINDINGS: Cardiac shadow is stable. Diffuse bilateral airspace opacities are seen slightly improved when compared with the prior exam. No effusion is noted. No bony abnormality is noted. IMPRESSION: Diffuse bilateral airspace opacity slightly improved when compared with the prior exam. Electronically Signed   By: Alcide Clever M.D.   On: 04/19/2023 00:45   DG Chest Port 1 View  Result Date: 04/18/2023 CLINICAL DATA:  Dyspnea.  Recent history of COVID. EXAM: PORTABLE CHEST 1 VIEW COMPARISON:  04/15/2023 and CT chest 04/16/2023 FINDINGS: New confluent airspace densities in the left mid lung. Persistent airspace disease throughout the right lung which is more dense particularly in the right perihilar region. Heart size is within normal limits and stable. Probable small pleural effusions based on previous CT findings. No acute bone abnormality. Postsurgical changes in the lumbar spine are only partially imaged. IMPRESSION: 1. Increased bilateral airspace disease, particularly in the left lung. Findings are most compatible with multifocal pneumonia. Electronically Signed   By: Richarda Overlie M.D.   On: 04/18/2023 10:39   ECHOCARDIOGRAM COMPLETE BUBBLE STUDY  Result Date: 04/17/2023    ECHOCARDIOGRAM REPORT   Patient Name:   Teresa Sherman [facility] Date of Exam: 04/17/2023 Medical Rec #:  [id number]             Height:       60.0 in Accession #:    [id number]            Weight:       129.2 lb Date of Birth:  03/18/1946             BSA:          1.550 m Patient Age:    77 years              BP:           104/79 mmHg Patient Gender: F                      HR:           96 bpm. Exam Location:  [facility] Procedure: 2D Echo and Saline Contrast Bubble Study Indications:     NSTEMI  History:         Patient has no prior history of Echocardiogram examinations.  Sonographer:     Overton Mam RDCS, FASE Referring Phys:  [id number] BRENDA MORRISON Diagnosing Phys: Dietrich Pates MD IMPRESSIONS  1. LVEF is severely depressed The proximal 1/4 of LV thickenes but the rest of ventricle is severely hypokinetic / akinetiic Appearance of Takotsubo's cardiomyopathy.. The left ventricle has severely decreased function.  2. Akinesis/dyskineiss of distal 2/3 of RV with aneurysmal dilitation in mid region . Right ventricular systolic function is moderately reduced. The right ventricular size is normal.  3. The mitral valve is normal in structure. Mild mitral valve regurgitation.  4. The aortic valve is tricuspid. Aortic valve regurgitation is moderate.  5. The inferior vena cava is dilated in size with <50% respiratory variability, suggesting right atrial pressure of 15 mmHg.  6. Agitated saline contrast bubble study was negative, with no evidence of any interatrial shunt. FINDINGS  Left Ventricle: LVEF is severely depressed The proximal 1/4 of LV thickenes but the rest of ventricle is severely hypokinetic / akinetiic Appearance of Takotsubo's cardiomyopathy. The left ventricle has severely decreased function. The left ventricular internal cavity size was normal in size. There is no left ventricular hypertrophy. Right Ventricle: Akinesis/dyskineiss of distal 2/3 of RV with aneurysmal dilitation in mid region. The right ventricular size is normal. Right vetricular wall thickness was not assessed. Right ventricular systolic function is moderately reduced. Left Atrium: Left atrial size was normal in size. Right Atrium: Right atrial size was normal in size. Pericardium: There is no evidence of pericardial effusion. Mitral Valve: The mitral valve is normal in structure. Mild mitral valve  regurgitation. Tricuspid Valve: The tricuspid valve is normal in structure. Tricuspid valve regurgitation is trivial. Aortic Valve: The aortic valve is tricuspid. Aortic valve regurgitation is moderate. Aortic regurgitation PHT measures 465 msec. Aortic valve peak gradient measures 5.3 mmHg. Pulmonic Valve: The pulmonic valve was not well visualized. Pulmonic valve regurgitation is not visualized. Aorta: The aortic root is normal in size and structure. Venous: The inferior vena cava is dilated in size with less than 50% respiratory variability, suggesting right atrial pressure of 15 mmHg. IAS/Shunts: No atrial level shunt detected by color flow Doppler. Agitated saline contrast was given intravenously to evaluate for intracardiac shunting. Agitated saline contrast bubble study was negative, with no evidence of any interatrial shunt.  LEFT VENTRICLE PLAX 2D LVIDd:         4.20 cm     Diastology LVIDs:         3.10 cm     LV e' medial:    9.36 cm/s LV PW:         0.80 cm     LV E/e' medial:  7.8 LV IVS:        0.70 cm     LV e' lateral:   18.20 cm/s LVOT diam:     1.80 cm     LV E/e' lateral: 4.0 LV SV:         46 LV SV Index:   29 LVOT Area:     2.54 cm  LV Volumes (MOD) LV vol d, MOD A2C: 63.6 ml LV vol d, MOD A4C: 70.9 ml LV vol s, MOD A2C: 40.7 ml LV vol s, MOD A4C: 39.8 ml LV SV MOD A2C:     22.9 ml LV SV MOD A4C:     70.9 ml LV SV MOD BP:      28.9 ml RIGHT  VENTRICLE RV Basal diam:  2.50 cm LEFT ATRIUM             Index        RIGHT ATRIUM           Index LA diam:        2.40 cm 1.55 cm/m   RA Area:     10.20 cm LA Vol (A2C):   33.9 ml 21.87 ml/m  RA Volume:   21.50 ml  13.87 ml/m LA Vol (A4C):   37.8 ml 24.38 ml/m LA Biplane Vol: 35.7 ml 23.03 ml/m  AORTIC VALVE                 PULMONIC VALVE AV Area (Vmax): 2.30 cm     PV Vmax:       0.85 m/s AV Vmax:        115.00 cm/s  PV Peak grad:  2.9 mmHg AV Peak Grad:   5.3 mmHg LVOT Vmax:      104.00 cm/s LVOT Vmean:     67.700 cm/s LVOT VTI:       0.179 m AI  PHT:         465 msec  AORTA Ao Root diam: 3.50 cm MITRAL VALVE MV Area (PHT): 7.90 cm    SHUNTS MV Decel Time: 96 msec     Systemic VTI:  0.18 m MV E velocity: 72.80 cm/s  Systemic Diam: 1.80 cm MV A velocity: 71.80 cm/s MV E/A ratio:  1.01 Dietrich Pates MD Electronically signed by Dietrich Pates MD Signature Date/Time: 04/17/2023/6:21:37 PM    Final         Scheduled Meds:  vitamin C  500 mg Oral BID   aspirin  81 mg Oral Daily   Chlorhexidine Gluconate Cloth  6 each Topical Daily   collagenase   Topical Daily   docusate sodium  200 mg Oral BID   donepezil  10 mg Oral QHS   enoxaparin (LOVENOX) injection  40 mg Subcutaneous QHS   feeding supplement (KATE FARMS STANDARD 1.4)  325 mL Oral TID BM   guaiFENesin  15 mL Oral TID   melatonin  5 mg Oral QHS   methylPREDNISolone (SOLU-MEDROL) injection  40 mg Intravenous Q12H   multivitamin with minerals  1 tablet Oral Q lunch   rosuvastatin  40 mg Oral Q2200   saccharomyces boulardii  250 mg Oral BID   zinc sulfate  220 mg Oral Daily   Continuous Infusions:  piperacillin-tazobactam (ZOSYN)  IV 3.375 g (04/19/23 0537)     LOS: 8 days    Time spent: 25 mins     Charise Killian, MD [facility] Pager [phone]  If 7PM-7AM, please contact night-coverage [email or web address] 04/19/2023, 8:10 AM

## 2023-04-19 NOTE — Progress Notes (Signed)
Transported patient to ICU on bipap with no events.

## 2023-04-19 NOTE — Progress Notes (Addendum)
RT called to see if pt's O2 could be increased/adjusted due to pt staying around sating around 85%-See flowsheet. Dr. Arville Care notified of communication with RT and condition of pt. Pt transferred to ICU safely after calling report. Teresa Sherman S Latoshia Monrroy

## 2023-04-19 NOTE — Progress Notes (Signed)
PHARMACY CONSULT NOTE - ELECTROLYTES  Pharmacy Consult for Electrolyte Monitoring and Replacement   Recent Labs: Height: 5' (152.4 cm) Weight: 56.6 kg (124 lb 12.5 oz) IBW/kg (Calculated) : 45.5 Estimated Creatinine Clearance: 47.9 mL/min (by C-G formula based on SCr of 0.63 mg/dL). Potassium (mmol/L)  Date Value  04/19/2023 3.4 (L)  11/26/2013 3.8   Magnesium (mg/dL)  Date Value  57/84/6962 2.2   Calcium (mg/dL)  Date Value  95/28/4132 7.9 (L)   Calcium, Total (mg/dL)  Date Value  44/08/270 9.5   Albumin (g/dL)  Date Value  53/66/4403 2.9 (L)  04/30/2015 4.3  11/26/2013 3.9   Phosphorus (mg/dL)  Date Value  47/42/5956 2.5   Sodium (mmol/L)  Date Value  04/19/2023 139  04/30/2015 140  11/26/2013 142   Assessment  Teresa Sherman is a 77 y.o. female presenting with Severe sepsis secondary to pneumonia . PMH significant for CVA, Anemia, and depression. Pharmacy has been consulted to monitor and replace electrolytes.  Goal of Therapy: Electrolytes WNL  Plan:  K 3.4, Kcl 10 mEq IV x 2 per provider Check BMP with AM labs as ordered  Thank you for involving pharmacy in this patient's care.   [name] 04/19/2023 11:22 AM

## 2023-04-20 DIAGNOSIS — A419 Sepsis, unspecified organism: Secondary | ICD-10-CM | POA: Diagnosis not present

## 2023-04-20 DIAGNOSIS — R6520 Severe sepsis without septic shock: Secondary | ICD-10-CM | POA: Diagnosis not present

## 2023-04-20 LAB — CBC
HCT: 29.5 % — ABNORMAL LOW (ref 36.0–46.0)
Hemoglobin: 10.1 g/dL — ABNORMAL LOW (ref 12.0–15.0)
MCH: 31.2 pg (ref 26.0–34.0)
MCHC: 34.2 g/dL (ref 30.0–36.0)
MCV: 91 fL (ref 80.0–100.0)
Platelets: 246 10*3/uL (ref 150–400)
RBC: 3.24 MIL/uL — ABNORMAL LOW (ref 3.87–5.11)
RDW: 14.4 % (ref 11.5–15.5)
WBC: 12.1 10*3/uL — ABNORMAL HIGH (ref 4.0–10.5)
nRBC: 0 % (ref 0.0–0.2)

## 2023-04-20 LAB — BASIC METABOLIC PANEL
Anion gap: 8 (ref 5–15)
BUN: 35 mg/dL — ABNORMAL HIGH (ref 8–23)
CO2: 29 mmol/L (ref 22–32)
Calcium: 7.7 mg/dL — ABNORMAL LOW (ref 8.9–10.3)
Chloride: 99 mmol/L (ref 98–111)
Creatinine, Ser: 0.63 mg/dL (ref 0.44–1.00)
GFR, Estimated: >60 mL/min (ref >60)
Glucose, Bld: 154 mg/dL — ABNORMAL HIGH (ref 70–99)
Potassium: 3.3 mmol/L — ABNORMAL LOW (ref 3.5–5.1)
Sodium: 136 mmol/L (ref 135–145)

## 2023-04-20 LAB — PROCALCITONIN: Procalcitonin: 1.28 ng/mL

## 2023-04-20 LAB — MAGNESIUM: Magnesium: 2.4 mg/dL (ref 1.7–2.4)

## 2023-04-20 MED ORDER — POTASSIUM CHLORIDE CRYS ER 20 MEQ PO TBCR
40.0000 meq | EXTENDED_RELEASE_TABLET | Freq: Once | ORAL | Status: AC
  Administered 2023-04-20: 40 meq via ORAL
  Filled 2023-04-20: qty 2

## 2023-04-20 MED ORDER — POTASSIUM CHLORIDE CRYS ER 20 MEQ PO TBCR: 40.0000 meq | EXTENDED_RELEASE_TABLET | Freq: Once | ORAL | Status: DC

## 2023-04-20 NOTE — Progress Notes (Signed)
PT Cancellation Note  Patient Details Name: Teresa Sherman MRN: [id number] DOB: April 22, 1946   Cancelled Treatment:    Reason Eval/Treat Not Completed: Medical issues which prohibited therapy Per Dr. Joylene Igo, patient not ready for PT. Will re-attempt at later date/time as medically appropriate.   Maylon Peppers, PT, DPT Physical Therapist - [facility]  [facility]   Jelina Paulsen A [name] 04/20/2023, 12:43 PM

## 2023-04-20 NOTE — Progress Notes (Signed)
PHARMACY CONSULT NOTE - ELECTROLYTES  Pharmacy Consult for Electrolyte Monitoring and Replacement   Recent Labs: Height: 5' (152.4 cm) Weight: 56.6 kg (124 lb 12.5 oz) IBW/kg (Calculated) : 45.5 Estimated Creatinine Clearance: 47.9 mL/min (by C-G formula based on SCr of 0.63 mg/dL). Potassium (mmol/L)  Date Value  04/20/2023 3.3 (L)  11/26/2013 3.8   Magnesium (mg/dL)  Date Value  40/98/1191 2.2   Calcium (mg/dL)  Date Value  47/82/9562 7.7 (L)   Calcium, Total (mg/dL)  Date Value  13/03/6577 9.5   Albumin (g/dL)  Date Value  46/96/2952 2.9 (L)  04/30/2015 4.3  11/26/2013 3.9   Phosphorus (mg/dL)  Date Value  84/13/2440 2.5   Sodium (mmol/L)  Date Value  04/20/2023 136  04/30/2015 140  11/26/2013 142   Assessment  Teresa Sherman is a 77 y.o. female presenting with Severe sepsis secondary to pneumonia . PMH significant for CVA, Anemia, and depression. Pharmacy has been consulted to monitor and replace electrolytes.  Goal of Therapy: Electrolytes WNL  Plan:  K 3.3, Kcl 40 mEq PO x 1 dose Check BMP with AM labs as ordered  Thank you for involving pharmacy in this patient's care.   [name] 04/20/2023 8:27 AM

## 2023-04-20 NOTE — Plan of Care (Signed)
  Problem: Fluid Volume: Goal: Hemodynamic stability will improve Outcome: Progressing   Problem: Clinical Measurements: Goal: Diagnostic test results will improve Outcome: Progressing   Problem: Respiratory: Goal: Ability to maintain adequate ventilation will improve Outcome: Progressing   Problem: Education: Goal: Knowledge of risk factors and measures for prevention of condition will improve Outcome: Progressing   Problem: Coping: Goal: Psychosocial and spiritual needs will be supported Outcome: Progressing   Problem: Respiratory: Goal: Will maintain a patent airway Outcome: Progressing Goal: Complications related to the disease process, condition or treatment will be avoided or minimized Outcome: Progressing

## 2023-04-20 NOTE — Progress Notes (Signed)
OT Cancellation Note  Patient Details Name: Teresa Sherman MRN: [id number] DOB: March 20, 1946   Cancelled Treatment:    Reason Eval/Treat Not Completed: Patient not medically ready;Other (comment) (per MD, pt not ready for therapy on this date. OT will reattempt as able.Oleta Mouse, OTD OTR/L  04/20/23, 1:16 PM

## 2023-04-20 NOTE — Progress Notes (Addendum)
Progress Note   Patient: Teresa Sherman EXB:[id number] DOB: Feb 09, 1946 DOA: 04/11/2023     9 DOS: the patient was seen and examined on 04/20/2023   Brief hospital course: Katharyn Inetta Franssen is a 77 y.o. female with medical history significant for Prior stroke, depression with anxiety, prior ESBL E. coli, who presents to the emergency room by EMS with altered mental status.  She was found after wellness check was requested after she had not been seen in 2 days.  She was found somnolent and confused in her bed and had unintelligible mumbling but without focal neurologic deficits.  She is reportedly oriented x 3 at baseline.  She was also noted by EMS to have foul-smelling urine ED course and data review: Normotensive tachycardic to 115, tachypneic to 23 with O2 sat 90 to 95% on room air, BP 101/83 improving to 126/87 with IV fluids Labs with normal WBC of 8.1 but lactic acid 2.4 and procalcitonin 7.73.  Hemoglobin noted to be 7.8 with baseline around 13 a year ago.  CMP notable for creatinine of 1.78, up from 0.51 a year ago, AST/ALT 45/23.  COVID-positive.  CK14 38.G, EKG personally viewed and interpreted showing sinus tachycardia at 111 with no acute ST-T wave changes. Chest x-ray with possible right lower lobe airspace opacity as further detailed below: IMPRESSION: Right lower lung zone airspace opacity. Followup PA and lateral chest X-ray is recommended in 3-4 weeks following trial of antibiotic therapy to ensure resolution and exclude underlying malignancy.   Patient started on sepsis fluids and given Rocephin for suspected UTI Hospitalist consulted for admission.    As per Dr. Mayford Knife 8/21-8/27/24: Pt was found to have severe sepsis secondary to COVID19 pneumonia & possible co-bacterial infection. Completed IV rocephin, azithromycin x 5 days but then restarted on unasyn which was then changed to IV zosyn as pt has been moved into stepdown x 2 overnight this admission so far. Pt was  placed on BiPAP overnight (last night) and is currently on HFNC. Discussed code status w/ pt and she wants to continue full code.    Assessment and Plan:  Severe sepsis with acute hypoxic respiratory failure. met criteria w/ tachycardia and tachypnea with soft blood pressure, AKI, AMS, lactic acidosis with elevated procalcitonin  Possibly secondary to COVID19 pneumonia vs bacterial co-infection vs UTI. Severe sepsis resolved   Patient noted to be 78% on RA. Was placed on BiPAP overnight 04/18/23 and currently on HFNC, wean as tolerated     COVID19 pneumonia: w/ vs. possible co-bacterial infection.  Pro-cal was elevated at 4.12.  Continue on bronchodilators & encourage incentive spirometry.  BP dropped on 08/22 and oxygen demand increased so pt was moved to stepdown overnight 04/15/23 & moved back to floor 04/17/23 and then moved back to stepdown overnight on 04/18/23.  CTA chest neg for PE.  Continue on IV zosyn as repeat XR on 04/18/23 showed worsening multifocal pneumonia.  S/p IV lasix x 1 today.       NSTEMI:  Etiology unclear, possibly secondary to COVID19.  Troponin >3,000 but trending down.  Continue on statin, aspirin.  D/c IV heparin drip.  BP & HR will not tolerate GDMT as per cardio.  Echo shows LVEF is severely depressed The proximal 1/4 of LV thickenes but the  rest of ventricle is severely hypokinetic / akinetiic Appearance of Takotsubo's cardiomyopathy. The left ventricle has severely decreased  function. Akinesis/dyskineiss of distal 2/3 of RV with aneurysmal dilitation in mid region. Cardio signed off 04/17/23  and will see as an outpatient.    R/o Dysphagia:  started back on regular diet 04/15/23 as per speech      UTI:  Hx pseudomonas and ESBL E. coli UTI 11/2021. Urine cx shows no growth. Completed abx course     Urinary retention: Bladder scan showed 800 mL, Foley catheter inserted on 8/20. Will do a voiding trial prior to d/c    AKI :  Resolved     Rhabdomyolysis:  Unknown whether traumatic or not. CK WNL. Resolved.    Acute metabolic encephalopathy:  likely secondary to above infections. CT head shows no acute intracranial findings. Pt is no longer pulling off her supplemental oxygen. Mental status waxes and wanes    Normocytic anemia: H&H is stable. No need for a transfusion currently    Hx of CVA: continue on aspirin, statin    Depression: severity unknown. Continue on home dose of trazodone    Constipation: continue on colace. Miralax prn      Subjective: Patient is seen and examined at the bedside.  Complains of generalized weakness  Physical Exam: Vitals:   04/20/23 1408 04/20/23 1432 04/20/23 1500 04/20/23 1630  BP:  107/72 105/74 99/65  Pulse: 84 93    Resp: 16 (!) 28 (!) 23 (!) 22  Temp:      TempSrc:      SpO2: 90% 98%    Weight:      Height:        General exam: appears calm but uncomfortable, very weak Respiratory system: course breath sounds b/l  Cardiovascular system: S1 & S2+. No rubs or clicks  Gastrointestinal system: abd is soft, NT, ND & hypoactive bowel sounds Central nervous system: alert and oriented. Moves all extremities  Psychiatry: judgement and insight appears improved. Flat mood and affect   Data Reviewed: Potassium 3.3, procalcitonin 1.28 shows a downward trend There are no new results to review at this time.  Family Communication: Plan of care discussed with patient at the bedside.  Disposition: Status is: Inpatient Remains inpatient appropriate because: Continues to have an increased oxygen requirement  Planned Discharge Destination:  TBD    Time spent: 40 minutes  Author: Lucile Shutters, MD 04/20/2023 5:34 PM  For on call review [email or web address].

## 2023-04-21 ENCOUNTER — Inpatient Hospital Stay: Payer: Medicare HMO

## 2023-04-21 DIAGNOSIS — R6520 Severe sepsis without septic shock: Secondary | ICD-10-CM | POA: Diagnosis not present

## 2023-04-21 DIAGNOSIS — A419 Sepsis, unspecified organism: Secondary | ICD-10-CM | POA: Diagnosis not present

## 2023-04-21 LAB — CBC
HCT: 30.1 % — ABNORMAL LOW (ref 36.0–46.0)
Hemoglobin: 10 g/dL — ABNORMAL LOW (ref 12.0–15.0)
MCH: 30.2 pg (ref 26.0–34.0)
MCHC: 33.2 g/dL (ref 30.0–36.0)
MCV: 90.9 fL (ref 80.0–100.0)
Platelets: 252 10*3/uL (ref 150–400)
RBC: 3.31 MIL/uL — ABNORMAL LOW (ref 3.87–5.11)
RDW: 14.3 % (ref 11.5–15.5)
WBC: 10.8 10*3/uL — ABNORMAL HIGH (ref 4.0–10.5)
nRBC: 0 % (ref 0.0–0.2)

## 2023-04-21 LAB — D-DIMER, QUANTITATIVE: D-Dimer, Quant: 19.7 ug{FEU}/mL — ABNORMAL HIGH (ref 0.00–0.50)

## 2023-04-21 LAB — BLOOD GAS, VENOUS
Acid-Base Excess: 11 mmol/L — ABNORMAL HIGH (ref 0.0–2.0)
Bicarbonate: 35.1 mmol/L — ABNORMAL HIGH (ref 20.0–28.0)
O2 Saturation: 62 %
Patient temperature: 37
pCO2, Ven: 43 mmHg — ABNORMAL LOW (ref 44–60)
pH, Ven: 7.52 — ABNORMAL HIGH (ref 7.25–7.43)
pO2, Ven: 38 mmHg (ref 32–45)

## 2023-04-21 LAB — C-REACTIVE PROTEIN
CRP: 2.9 mg/dL — ABNORMAL HIGH (ref <1.0)
CRP: 3.4 mg/dL — ABNORMAL HIGH (ref <1.0)

## 2023-04-21 LAB — BASIC METABOLIC PANEL
Anion gap: 6 (ref 5–15)
BUN: 26 mg/dL — ABNORMAL HIGH (ref 8–23)
CO2: 27 mmol/L (ref 22–32)
Calcium: 7.6 mg/dL — ABNORMAL LOW (ref 8.9–10.3)
Chloride: 104 mmol/L (ref 98–111)
Creatinine, Ser: 0.6 mg/dL (ref 0.44–1.00)
GFR, Estimated: >60 mL/min (ref >60)
Glucose, Bld: 147 mg/dL — ABNORMAL HIGH (ref 70–99)
Potassium: 3.8 mmol/L (ref 3.5–5.1)
Sodium: 137 mmol/L (ref 135–145)

## 2023-04-21 LAB — FERRITIN: Ferritin: 111 ng/mL (ref 11–307)

## 2023-04-21 LAB — MAGNESIUM: Magnesium: 1.9 mg/dL (ref 1.7–2.4)

## 2023-04-21 MED ORDER — FUROSEMIDE 10 MG/ML IJ SOLN
20.0000 mg | Freq: Once | INTRAMUSCULAR | Status: AC
  Administered 2023-04-21: 20 mg via INTRAVENOUS
  Filled 2023-04-21: qty 2

## 2023-04-21 NOTE — Progress Notes (Signed)
PHARMACY CONSULT NOTE - ELECTROLYTES  Pharmacy Consult for Electrolyte Monitoring and Replacement   Recent Labs: Height: 5' (152.4 cm) Weight: 56.6 kg (124 lb 12.5 oz) IBW/kg (Calculated) : 45.5 Estimated Creatinine Clearance: 47.9 mL/min (by C-G formula based on SCr of 0.6 mg/dL). Potassium (mmol/L)  Date Value  04/21/2023 3.8  11/26/2013 3.8   Magnesium (mg/dL)  Date Value  32/20/2542 2.4   Calcium (mg/dL)  Date Value  70/62/3762 7.6 (L)   Calcium, Total (mg/dL)  Date Value  83/15/1761 9.5   Albumin (g/dL)  Date Value  60/73/7106 2.9 (L)  04/30/2015 4.3  11/26/2013 3.9   Phosphorus (mg/dL)  Date Value  26/94/8546 2.5   Sodium (mmol/L)  Date Value  04/21/2023 137  04/30/2015 140  11/26/2013 142   Assessment  Teresa Sherman is a 77 y.o. female presenting with Severe sepsis secondary to pneumonia . PMH significant for CVA, Anemia, and depression. Pharmacy has been consulted to monitor and replace electrolytes.  Goal of Therapy: Electrolytes WNL  Plan:  No electrolyte replacement indicated at this time No labs ordered for tomorrow AM at this time  Thank you for involving pharmacy in this patient's care.   Teresa Sherman 04/21/2023 7:58 AM

## 2023-04-21 NOTE — Progress Notes (Signed)
Progress Note   Patient: Teresa Sherman ZOX:[id number] DOB: July 02, 1946 DOA: 04/11/2023     10 DOS: the patient was seen and examined on 04/21/2023   Brief hospital course: Teresa Sherman is a 77 y.o. female with medical history significant for Prior stroke, depression with anxiety, prior ESBL E. coli, who presents to the emergency room by EMS with altered mental status.  She was found after wellness check was requested after she had not been seen in 2 days.  She was found somnolent and confused in her bed and had unintelligible mumbling but without focal neurologic deficits.  She is reportedly oriented x 3 at baseline.  She was also noted by EMS to have foul-smelling urine ED course and data review: Normotensive tachycardic to 115, tachypneic to 23 with O2 sat 90 to 95% on room air, BP 101/83 improving to 126/87 with IV fluids Labs with normal WBC of 8.1 but lactic acid 2.4 and procalcitonin 7.73.  Hemoglobin noted to be 7.8 with baseline around 13 a year ago.  CMP notable for creatinine of 1.78, up from 0.51 a year ago, AST/ALT 45/23.  COVID-positive.  CK14 38.G, EKG personally viewed and interpreted showing sinus tachycardia at 111 with no acute ST-T wave changes. Chest x-ray with possible right lower lobe airspace opacity as further detailed below: IMPRESSION: Right lower lung zone airspace opacity. Followup PA and lateral chest X-ray is recommended in 3-4 weeks following trial of antibiotic therapy to ensure resolution and exclude underlying malignancy.   Patient started on sepsis fluids and given Rocephin for suspected UTI Hospitalist consulted for admission.    As per Dr. Mayford Knife 8/21-8/27/24: Pt was found to have severe sepsis secondary to COVID19 pneumonia & possible co-bacterial infection. Completed IV rocephin, azithromycin x 5 days but then restarted on unasyn which was then changed to IV zosyn as pt has been moved into stepdown x 2 overnight this admission so far. Pt was  placed on BiPAP overnight (last night) and is currently on HFNC. Discussed code status w/ pt and she wants to continue full code.    04/21/23 : Patient seen and examined at the bedside.  Continues to have an increased oxygen requirement and is currently on HFNC  55L/90%.  She appears very weak. Called patient's daughter, Teresa Sherman who wants her mother transferred to [facility] [place]. [facility] and discussed patient with intensivist who states that he has nothing different to offer her at this time and for [place] to call back if something changes in her condition.  Informed patient's daughter who is upset and is insistent that her mother be transferred because she is not satisfied with the care her mother is receiving here.  I have explained to her in detail that the patient can only be transferred due to medical necessity and not family request.  She requested I speak to her mother's primary care provider, Dr [name] ([phone]).  Called but not working today.   Assessment and Plan:  Severe sepsis with acute hypoxic respiratory failure. met criteria w/ tachycardia and tachypnea with soft blood pressure, AKI, AMS, lactic acidosis with elevated procalcitonin  Possibly secondary to COVID19 pneumonia vs bacterial co-infection vs UTI. Severe sepsis resolved   Patient noted to be 78% on RA. Was placed on BiPAP overnight 04/18/23 and currently on HFNC. Continues to have an increased oxygen requirement and is currently on 55 L and 90% FiO2 with pulse oximetry of 90.        COVID19 pneumonia: w/  vs. possible co-bacterial infection.  Pro-cal was elevated at 4.12.  Continue on bronchodilators & encourage incentive spirometry.  BP dropped on 08/22 and oxygen demand increased so pt was moved to stepdown overnight 04/15/23 & moved back to floor 04/17/23 and then moved back to stepdown overnight on 04/18/23.  CTA chest neg for PE.  Continue on IV zosyn as repeat XR on 04/18/23 showed worsening multifocal  pneumonia.  Received a dose of Lasix 20 mg IV Continue systemic steroids      NSTEMI:  Etiology unclear, possibly secondary to COVID19.  Troponin >3,000 but trending down.  Continue on statin, aspirin.  D/c IV heparin drip.  BP & HR will not tolerate GDMT as per cardio.  Echo shows LVEF is severely depressed The proximal 1/4 of LV thickenes but the  rest of ventricle is severely hypokinetic / akinetiic Appearance of Takotsubo's cardiomyopathy. The left ventricle has severely decreased  function. Akinesis/dyskineiss of distal 2/3 of RV with aneurysmal dilitation in mid region. Cardio signed off 04/17/23 and will see as an outpatient.     R/o Dysphagia:  started back on regular diet 04/15/23 as per speech       UTI:  Hx pseudomonas and ESBL E. coli UTI 11/2021. Urine cx shows no growth. Completed abx course      Urinary retention: Bladder scan showed 800 mL, Foley catheter inserted on 8/20. Will do a voiding trial prior to d/c     AKI :  Resolved     Rhabdomyolysis:  Unknown whether traumatic or not. CK WNL. Resolved.    Acute metabolic encephalopathy:  likely secondary to above infections. CT head shows no acute intracranial findings.  Pt is no longer pulling off her supplemental oxygen. Mental status waxes and wanes  Currently oriented to person and place   Normocytic anemia:  H&H is stable No need for a transfusion currently    Hx of CVA:  continue on aspirin, statin    Depression:  severity unknown. Continue on home dose of trazodone    Constipation:  Discontinue Colace and MiraLAX due to loose stools Hold Aricept which can also contribute to diarrhea         Subjective: Patient is seen and examined at the bedside.  Noted to be very weak.  Has not increased oxygen requirement.  Has had several mushy stools  Physical Exam: Vitals:   04/21/23 1300 04/21/23 1330 04/21/23 1400 04/21/23 1455  BP: 101/69 102/72 106/82   Pulse: 79 76 78   Resp: (!) 30 (!) 22 18    Temp: 97.8 F (36.6 C)     TempSrc:      SpO2: 95% 92% 100% 94%  Weight:      Height:       General exam: appears calm but uncomfortable, very weak Respiratory system: course breath sounds b/l  Cardiovascular system: S1 & S2+. No rubs or clicks  Gastrointestinal system: abd is soft, NT, ND &  bowel sounds ++ Central nervous system: alert and oriented. Moves all extremities  Psychiatry: judgement and insight appears improved. Flat mood and affect      Data Reviewed: Labs reviewed.  White count 10.8, D-dimer 19.7, CRP 2.9 There are no new results to review at this time.  Family Communication: Called and spoke to patient's daughter, Teresa Sherman over the phone several times.   Disposition: Status is: Inpatient Remains inpatient appropriate because: Patient continues to have a high oxygen requirement  Planned Discharge Destination:  TBD  Time spent: 40 minutes  Author: Lucile Shutters, MD 04/21/2023 3:05 PM  For on call review [email or web address].

## 2023-04-21 NOTE — Progress Notes (Signed)
PT Cancellation Note  Patient Details Name: Abbeygail Teichmann MRN: [id number] DOB: [birth year]   Cancelled Treatment:    Reason Eval/Treat Not Completed: Medical issues which prohibited therapy (Pt xfered to CCU for car needs. MD reports pt not appropriate for PT/OT at this time. Will sign off and await new orders once pt is ready to resume rehab services.)  9:37 AM, 04/21/23 Rosamaria Lints, PT, DPT Physical Therapist - [facility]  [phone] [facility])    Toluwanimi Radebaugh C 04/21/2023, 9:37 AM
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
## 2023-04-21 NOTE — Progress Notes (Signed)
Notified Dr. Mervyn Skeeters and Elvina Sidle regarding patients elevated D-dimer. Continue to assess.

## 2023-04-21 NOTE — Progress Notes (Signed)
OT Cancellation Note  Patient Details Name: Teresa Sherman MRN: [id number] DOB: 02-Apr-1946   Cancelled Treatment:    Reason Eval/Treat Not Completed: Other (comment) (per MD, pt is not ready for therapy at this time, requests discharge from services. Please re consult if there is a change in functional status.) Oleta Mouse, OTD OTR/L  04/21/23, 2:27 PM

## 2023-04-21 NOTE — Progress Notes (Signed)
Nutrition Follow-up  DOCUMENTATION CODES:   Not applicable  INTERVENTION:   Discontinue [employer]  Add Magic cup TID with meals, each supplement provides 290 kcal and 9 grams of protein  Add Vital Cuisine TID with meals, each supplement provides 520kcal and 22g of protein.   MVI po daily   Vitamin C 500mg  po BID  Zinc 220mg  po daily x 14 days  Pt at high refeed risk; recommend monitor potassium, magnesium and phosphorus labs daily until stable  Daily weights   NUTRITION DIAGNOSIS:   Increased nutrient needs related to acute illness (COVID-19) as evidenced by estimated needs. -ongoing  GOAL:   Patient will meet greater than or equal to 90% of their needs -not met   MONITOR:   PO intake, Supplement acceptance, Labs, Weight trends, I & O's, Skin  ASSESSMENT:   77 y/o female with h/o anxiety, depression, stroke, dementia, spinal stenosis, HLD and GERD who is admitted with COVID 19, AKI, UTI, NSTEMI and sepsis.  Pt NPO for the first 5 days of admission. Pt was able to be placed on a diet 8/23. Pt with good appetite an oral intake for the first few days, pt was eating 50-90% of meals. Pt's appetite and oral intake has continued to decline over the past 3 days; pt eating <10% of meals yesterday and is refusing meals today. Pt does not like the [employer]. Pt reports today that she is willing to try some vanilla ice cream. RD will add vanilla Magic Cups and vital cuisine to meal trays. Pt remains at high refeed risk. Discussed NGT placement and nutrition support with medical staff in rounds today. Medical team concerned that patient will not tolerate NGT placement. RD will monitor pt's oral intake. Per chart, pt is down ~12lbs since admission but appears to be back at her UBW.    Medications reviewed and include: vitamin C, aspirin, lovenox, lasix, melatonin, solu-medrol, MVI, florastor, zinc, zosyn   Labs reviewed: K 3.8 wnl, BUN 26(H), Mg 1.9 wnl Wbc- 10.8(H), Hgb 10.0(L),  Hct 30.1(L)  Diet Order:   Diet Order             Diet regular Room service appropriate? Yes with Assist; Fluid consistency: Thin  Diet effective now                  EDUCATION NEEDS:   Education needs have been addressed  Skin:  Skin Assessment: Skin Integrity Issues: Skin Integrity Issues:: Unstageable DTI: coccyx Unstageable: rt coccyx  Last BM:  8/29- type 6  Height:   Ht Readings from Last 1 Encounters:  04/19/23 5' (1.524 m)    Weight:   Wt Readings from Last 1 Encounters:  04/19/23 56.6 kg    Ideal Body Weight:  45.5 kg  BMI:  Body mass index is 24.37 kg/m.  Estimated Nutritional Needs:   Kcal:  1400-1600kcal/day  Protein:  70-80g/day  Fluid:  1.2-1.4L/day  Betsey Holiday MS, RD, LDN Please refer to [facility] for RD and/or RD on-call/weekend/after hours pager

## 2023-04-21 NOTE — TOC Progression Note (Signed)
Transition of Care [facility]) - Progression Note    Patient Details  Name: Teresa Sherman MRN: [id number] Date of Birth: Feb 01, 1946  [facility] CM/SW Contact  Kreg Shropshire, RN Phone Number: 04/21/2023, 11:01 AM  Clinical Narrative:     Cm received call from pt daughter Jennifer [phone] stating that she would like her mother transferred to [facility] because her condition is getting worse. Cm stated that cm will let the MD know to give daughter a call back. Cm explained that cm cannot make decisions regarding transferring pt back. Daughter also wanted to know if insurance would cover ambulance ride if she is transferred.   Daughter stated all pt doctors are at [facility] and she would prefer her mother go there so pt primary care doctor can check on her. Cm notified MD Tochoukwu Agbata to have her call pt daughter.        Expected Discharge Plan and Services                                               Social Determinants of Health (SDOH) Interventions SDOH Screenings   Food Insecurity: No Food Insecurity (04/13/2023)  Housing: Low Risk  (04/13/2023)  Transportation Needs: No Transportation Needs (04/13/2023)  Utilities: Not At Risk (04/13/2023)  Financial Resource Strain: Low Risk  (06/14/2021)   Received from [facility]  Physical Activity: Sufficiently Active (10/30/2021)   Received from [facility]  Social Connections: Socially Isolated (10/30/2021)   Received from [facility]  Stress: No Stress Concern Present (10/30/2021)   Received from [facility]  Tobacco Use: Low Risk  (04/11/2023)  Health Literacy: Medium Risk (10/30/2021)   Received from [facility]    Readmission Risk Interventions     No data to display

## 2023-04-21 NOTE — Plan of Care (Signed)
  Problem: Respiratory: Goal: Ability to maintain adequate ventilation will improve Outcome: Progressing   Problem: Education: Goal: Knowledge of risk factors and measures for prevention of condition will improve Outcome: Progressing   Problem: Coping: Goal: Psychosocial and spiritual needs will be supported Outcome: Progressing   Problem: Respiratory: Goal: Will maintain a patent airway Outcome: Progressing   Problem: Education: Goal: Knowledge of General Education information will improve Description: Including pain rating scale, medication(s)/side effects and non-pharmacologic comfort measures Outcome: Progressing   Problem: Clinical Measurements: Goal: Ability to maintain clinical measurements within normal limits will improve Outcome: Progressing Goal: Will remain free from infection Outcome: Progressing   Problem: Nutrition: Goal: Adequate nutrition will be maintained Outcome: Progressing   Problem: Safety: Goal: Ability to remain free from injury will improve Outcome: Progressing   Problem: Skin Integrity: Goal: Risk for impaired skin integrity will decrease Outcome: Progressing

## 2023-04-22 ENCOUNTER — Inpatient Hospital Stay: Admit: 2023-04-22 | Payer: Medicare HMO

## 2023-04-22 ENCOUNTER — Inpatient Hospital Stay: Payer: Medicare HMO

## 2023-04-22 ENCOUNTER — Other Ambulatory Visit (HOSPITAL_COMMUNITY): Payer: Self-pay

## 2023-04-22 ENCOUNTER — Inpatient Hospital Stay
Admit: 2023-04-22 | Discharge: 2023-04-22 | Disposition: A | Discharge status: Home / Self Care | Payer: Medicare HMO | Attending: Internal Medicine | Admitting: Internal Medicine

## 2023-04-22 DIAGNOSIS — I080 Rheumatic disorders of both mitral and aortic valves: Secondary | ICD-10-CM

## 2023-04-22 DIAGNOSIS — R079 Chest pain, unspecified: Secondary | ICD-10-CM | POA: Diagnosis not present

## 2023-04-22 DIAGNOSIS — R6520 Severe sepsis without septic shock: Secondary | ICD-10-CM | POA: Diagnosis not present

## 2023-04-22 DIAGNOSIS — I509 Heart failure, unspecified: Secondary | ICD-10-CM | POA: Diagnosis not present

## 2023-04-22 DIAGNOSIS — A419 Sepsis, unspecified organism: Secondary | ICD-10-CM | POA: Diagnosis not present

## 2023-04-22 DIAGNOSIS — I429 Cardiomyopathy, unspecified: Secondary | ICD-10-CM | POA: Diagnosis not present

## 2023-04-22 DIAGNOSIS — I214 Non-ST elevation (NSTEMI) myocardial infarction: Secondary | ICD-10-CM | POA: Diagnosis not present

## 2023-04-22 DIAGNOSIS — I5030 Unspecified diastolic (congestive) heart failure: Secondary | ICD-10-CM

## 2023-04-22 LAB — CBC
HCT: 30.4 % — ABNORMAL LOW (ref 36.0–46.0)
Hemoglobin: 10.1 g/dL — ABNORMAL LOW (ref 12.0–15.0)
MCH: 30.3 pg (ref 26.0–34.0)
MCHC: 33.2 g/dL (ref 30.0–36.0)
MCV: 91.3 fL (ref 80.0–100.0)
Platelets: 285 10*3/uL (ref 150–400)
RBC: 3.33 MIL/uL — ABNORMAL LOW (ref 3.87–5.11)
RDW: 14.2 % (ref 11.5–15.5)
WBC: 12.3 10*3/uL — ABNORMAL HIGH (ref 4.0–10.5)
nRBC: 0 % (ref 0.0–0.2)

## 2023-04-22 LAB — RENAL FUNCTION PANEL
Albumin: 2.4 g/dL — ABNORMAL LOW (ref 3.5–5.0)
Anion gap: 16 — ABNORMAL HIGH (ref 5–15)
BUN: 27 mg/dL — ABNORMAL HIGH (ref 8–23)
CO2: 27 mmol/L (ref 22–32)
Calcium: 8.4 mg/dL — ABNORMAL LOW (ref 8.9–10.3)
Chloride: 97 mmol/L — ABNORMAL LOW (ref 98–111)
Creatinine, Ser: 0.52 mg/dL (ref 0.44–1.00)
GFR, Estimated: >60 mL/min (ref >60)
Glucose, Bld: 142 mg/dL — ABNORMAL HIGH (ref 70–99)
Phosphorus: 3.1 mg/dL (ref 2.5–4.6)
Potassium: 3.5 mmol/L (ref 3.5–5.1)
Sodium: 140 mmol/L (ref 135–145)

## 2023-04-22 LAB — BRAIN NATRIURETIC PEPTIDE: B Natriuretic Peptide: 875.1 pg/mL — ABNORMAL HIGH (ref 0.0–100.0)

## 2023-04-22 LAB — ECHOCARDIOGRAM COMPLETE
AR max vel: 2.67 cm2
AV Area VTI: 3.35 cm2
AV Area mean vel: 2.58 cm2
AV Mean grad: 3 mmHg
AV Peak grad: 5.7 mmHg
Ao pk vel: 1.19 m/s
Area-P 1/2: 6.37 cm2
Calc EF: 35.6 %
Height: 60 in
S' Lateral: 3.7 cm
Single Plane A2C EF: 46.7 %
Single Plane A4C EF: 30 %
Weight: 2049.4 oz

## 2023-04-22 LAB — FERRITIN: Ferritin: 99 ng/mL (ref 11–307)

## 2023-04-22 LAB — MAGNESIUM: Magnesium: 2.1 mg/dL (ref 1.7–2.4)

## 2023-04-22 LAB — C-REACTIVE PROTEIN: CRP: 2.7 mg/dL — ABNORMAL HIGH (ref <1.0)

## 2023-04-22 LAB — PROCALCITONIN: Procalcitonin: 0.22 ng/mL

## 2023-04-22 LAB — HEPARIN LEVEL (UNFRACTIONATED): Heparin Unfractionated: 0.71 [IU]/mL — ABNORMAL HIGH (ref 0.30–0.70)

## 2023-04-22 MED ORDER — SACUBITRIL-VALSARTAN 24-26 MG PO TABS
1.0000 | ORAL_TABLET | Freq: Two times a day (BID) | ORAL | Status: DC
  Administered 2023-04-22 – 2023-04-26 (×8): 1 via ORAL
  Filled 2023-04-22 (×10): qty 1

## 2023-04-22 MED ORDER — QUETIAPINE FUMARATE 25 MG PO TABS: 12.5000 mg | ORAL_TABLET | Freq: Every day | ORAL | Status: DC

## 2023-04-22 MED ORDER — METHYLPREDNISOLONE SODIUM SUCC 40 MG IJ SOLR
40.0000 mg | INTRAMUSCULAR | Status: DC
  Administered 2023-04-23 – 2023-04-25 (×3): 40 mg via INTRAVENOUS
  Filled 2023-04-22 (×3): qty 1

## 2023-04-22 MED ORDER — MORPHINE SULFATE (PF) 2 MG/ML IV SOLN
2.0000 mg | INTRAVENOUS | Status: DC | PRN
  Administered 2023-04-22 – 2023-04-27 (×5): 2 mg via INTRAVENOUS
  Filled 2023-04-22 (×6): qty 1

## 2023-04-22 MED ORDER — DIGOXIN 250 MCG PO TABS
0.2500 mg | ORAL_TABLET | Freq: Every day | ORAL | Status: DC
  Administered 2023-04-22 – 2023-04-24 (×3): 0.25 mg via ORAL
  Filled 2023-04-22 (×4): qty 1

## 2023-04-22 MED ORDER — HEPARIN (PORCINE) 25000 UT/250ML-% IV SOLN
900.0000 [IU]/h | INTRAVENOUS | Status: DC
  Administered 2023-04-22: 950 [IU]/h via INTRAVENOUS
  Administered 2023-04-23: 900 [IU]/h via INTRAVENOUS
  Filled 2023-04-22 (×2): qty 250

## 2023-04-22 MED ORDER — HEPARIN BOLUS VIA INFUSION
3400.0000 [IU] | Freq: Once | INTRAVENOUS | Status: AC
  Administered 2023-04-22: 3400 [IU] via INTRAVENOUS
  Filled 2023-04-22: qty 3400

## 2023-04-22 MED ORDER — LORAZEPAM 2 MG/ML IJ SOLN
0.5000 mg | Freq: Once | INTRAMUSCULAR | Status: AC
  Administered 2023-04-22: 0.5 mg via INTRAVENOUS
  Filled 2023-04-22: qty 1

## 2023-04-22 MED ORDER — DAPAGLIFLOZIN PROPANEDIOL 10 MG PO TABS
10.0000 mg | ORAL_TABLET | Freq: Every day | ORAL | Status: DC
  Administered 2023-04-22 – 2023-04-26 (×5): 10 mg via ORAL
  Filled 2023-04-22 (×6): qty 1

## 2023-04-22 MED ORDER — SODIUM CHLORIDE 0.9 % IV SOLN: INTRAVENOUS | Status: DC | PRN

## 2023-04-22 MED ORDER — QUETIAPINE FUMARATE 25 MG PO TABS: 12.5000 mg | ORAL_TABLET | Freq: Once | ORAL | Status: DC

## 2023-04-22 MED ORDER — FUROSEMIDE 10 MG/ML IJ SOLN
20.0000 mg | Freq: Once | INTRAMUSCULAR | Status: AC
  Administered 2023-04-22: 20 mg via INTRAVENOUS
  Filled 2023-04-22: qty 2

## 2023-04-22 NOTE — TOC Benefit Eligibility Note (Signed)
 Patient Product/process development scientist completed.    The patient is insured through [employer]. Patient has Medicare and is not eligible for a copay card, but may be able to apply for patient assistance, if available.    Ran test claim for Eliquis 5 mg and the current 30 day co-pay is $47.00.   This test claim was processed through [facility]- copay amounts may vary at other pharmacies due to pharmacy/plan contracts, or as the patient moves through the different stages of their insurance plan.     Roland Earl, CPHT Pharmacy Technician III Certified Patient Advocate [facility] Patient Advocate Team Direct Number: [phone]  Fax: [phone]

## 2023-04-22 NOTE — Progress Notes (Signed)
ANTICOAGULATION CONSULT NOTE  Pharmacy Consult for Heparin Infusion Indication: DVT  Allergies  Allergen Reactions   Duloxetine Hcl Nausea And Vomiting   Monosodium Glutamate     migraine   Nitrates, Organic     migraine   Sodium Benzoate [Nutritional Supplements]     migraine    Patient Measurements: Height: 5' (152.4 cm) Weight: 58.1 kg (128 lb 1.4 oz) IBW/kg (Calculated) : 45.5 Heparin Dosing Weight: 56.6 kg   Vital Signs: Temp: 97.6 F (36.4 C) (08/30 1700) BP: 121/80 (08/30 1700) Pulse Rate: 98 (08/30 1700)  Labs: Recent Labs    04/20/23 0400 04/21/23 0342 04/21/23 0904 04/22/23 0439 04/22/23 1630  HGB 10.1*  --  10.0* 10.1*  --   HCT 29.5*  --  30.1* 30.4*  --   PLT 246  --  252 285  --   HEPARINUNFRC  --   --   --   --  0.71*  CREATININE 0.63 0.60  --  0.52  --     Estimated Creatinine Clearance: 48.4 mL/min (by C-G formula based on SCr of 0.52 mg/dL).   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   unk   aspirin 81 MG chewable tablet Chew 81 mg by mouth daily.   04/10/2023   aspirin-acetaminophen-caffeine (EXCEDRIN MIGRAINE) 250-250-65 MG tablet Take by mouth every 6 (six) hours as needed for headache.   unk   atorvastatin (LIPITOR) 40 MG tablet Take 40 mg by mouth daily.   04/10/2023   Azelastine HCl 137 MCG/SPRAY SOLN Place 1 spray into both nostrils at bedtime.   unk   Biotin 16109 MCG TBDP Take 1 tablet by mouth every morning.   04/10/2023   donepezil (ARICEPT) 10 MG tablet Take 5 MG HS for 4 weeks, then 10 MG HS PO.   04/10/2023   guaiFENesin (ROBITUSSIN) 100  MG/5ML liquid Take 5 mLs by mouth every 4 (four) hours as needed for cough or to loosen phlegm. 120 mL 0 unk   melatonin 1 MG TABS tablet Take 0.5 mg by mouth at bedtime.   unk   traZODone (DESYREL) 50 MG tablet Take 25 mg by mouth at bedtime as needed for sleep.   unk   minoxidil (LONITEN) 2.5 MG tablet Take 1/2 pill once daily. 30 tablet 1     Assessment: Teresa Sherman is a 77 y.o. female presenting with COVID. PMH significant for CVA, depression, anxiety. Patient was not on [facility] PTA per chart review. Patient received lovenox 40 mg SQ on 8/29 @ 2120. Pharmacy has been consulted to initiate and manage heparin infusion.   Baseline Labs: Hgb 10.1, Hct 30.4, Plt 285   Goal of Therapy:  Heparin level 0.3-0.7 units/ml Monitor platelets by anticoagulation protocol: Yes  Date Time HL Rate/Comment  8/30 1630 0.71 950/supratherapeutic  Plan:  HL slightly supratherapeutic  Decrease heparin infusion to 900 units/hr Check HL in 8 hours  Continue to monitor H&H and platelets daily while on heparin infusion   Celene Squibb, PharmD Clinical Pharmacist 04/22/2023 5:42 PM

## 2023-04-22 NOTE — Progress Notes (Signed)
Nutrition Follow-up  DOCUMENTATION CODES:   Not applicable  INTERVENTION:   Discontinue Magic cups and add ice cream with meal trays.   Discontinue Vital Cuisine   MVI po daily   Vitamin C 500mg  po BID  Zinc 220mg  po daily x 14 days  Pt remains at high refeed risk; recommend monitor potassium, magnesium and phosphorus labs daily until stable  Daily weights   NUTRITION DIAGNOSIS:   Increased nutrient needs related to acute illness (COVID-19) as evidenced by estimated needs. -ongoing  GOAL:   Patient will meet greater than or equal to 90% of their needs -not met   MONITOR:   PO intake, Supplement acceptance, Labs, Weight trends, I & O's, Skin  ASSESSMENT:   77 y/o female with h/o anxiety, depression, stroke, dementia, spinal stenosis, HLD and GERD who is admitted with COVID 19, AKI, UTI, NSTEMI, DVT and sepsis.  Pt with slight improvement in her oral intake today. RN reports pt ate a whole plate of eggs for dinner last night and pt ate some potatoes and eggs for breakfast this morning. Pt is refusing all supplements. Pt's daughter has brought in some chocolates and fruit cups at bedside. Per family, pt is a very picky eater at baseline. Pt is unable to tolerate any additives or preservatives as she reports they give her migraines. Pt eats an all organic diet. Spoke with pt's daughter via phone. Daughter reports that pt loves broccoli, chicken, salmon, potatoes (sweet and regular), eggs, cottage cheese, white cheese (no yellow cheese), blueberries, plain white rice, applesauce, oatmeal with honey, hot tea, green beans, peas, pineapple apple juice, orange juice, ice cream and fruit. RD will make notes of this in [facility] and will try and have only foods sent up that pt will eat. Daughter will try to bring in some food that pt likes. Daughter is requesting for pt to have PT work with pt; MD notified. RD will continue to monitor pt's oral intake. Pt is having a lot of anxiety in  hospital and is fearful she is dying. May need to consider NGT and nutrition support if pt's oral intake does not improve. Family is requesting transfer to [facility].   Medications reviewed and include: vitamin C, aspirin, melatonin, solu-medrol, MVI, zinc, heparin, zosyn   Labs reviewed: K 3.5 wnl, BUN 27(H), P 3.1 wnl, Mg 2.1 wnl BNP- 875.1(H) Wbc- 12.3(H), Hgb 10.1(L), Hct 30.4(L)  Diet Order:   Diet Order             Diet regular Room service appropriate? Yes with Assist; Fluid consistency: Thin  Diet effective now                  EDUCATION NEEDS:   Education needs have been addressed  Skin:  Skin Assessment: Skin Integrity Issues: Skin Integrity Issues:: Unstageable DTI: coccyx Unstageable: rt coccyx  Last BM:  8/29- type 6  Height:   Ht Readings from Last 1 Encounters:  04/19/23 5' (1.524 m)    Weight:   Wt Readings from Last 1 Encounters:  04/22/23 58.1 kg    Ideal Body Weight:  45.5 kg  BMI:  Body mass index is 25.02 kg/m.  Estimated Nutritional Needs:   Kcal:  1400-1600kcal/day  Protein:  70-80g/day  Fluid:  1.2-1.4L/day  Betsey Holiday MS, RD, LDN Please refer to [facility] for RD and/or RD on-call/weekend/after hours pager

## 2023-04-22 NOTE — Discharge Summary (Signed)
Physician Discharge Summary   Patient: Teresa Sherman MRN: [id number] DOB: 1945-08-25  Admit date:     04/11/2023  Discharge date:   Discharge Physician: Aquita Simmering   [facility]   Recommendations at discharge:   Transfer to [facility]  Discharge Diagnoses: Principal Problem:   Severe sepsis (HCC) Active Problems:   CAP (community acquired pneumonia)   Pneumonia due to COVID-19 virus   Urinary tract infection   AKI (acute kidney injury) (HCC)   Rhabdomyolysis   Acute metabolic encephalopathy   Anemia   Depression with anxiety   History of stroke   Acute on chronic respiratory failure with hypoxia (HCC)  Resolved Problems:   * No resolved hospital problems. *  Hospital Course: Teresa Sherman is a 77 y.o. female with medical history significant for Prior stroke, depression with anxiety, prior ESBL E. Coli UTI who presents to the emergency room by EMS with altered mental status.  She was found at home after a wellness check was requested after she had not been seen in 2 days.  She was found somnolent and confused in her bed and had unintelligible mumbling but without focal neurologic deficits.  She is reportedly oriented x 3 at baseline.  She was also noted by EMS to have foul-smelling urine ED course and data review: Normotensive tachycardic to 115, tachypneic to 23 with O2 sat 90 to 95% on room air, BP 101/83 improving to 126/87 with IV fluids Labs with normal WBC of 8.1 but lactic acid 2.4 and procalcitonin 7.73.  Hemoglobin noted to be 7.8 with baseline around 13 a year ago.  CMP notable for creatinine of 1.78, up from 0.51 a year ago, AST/ALT 45/23.  COVID-positive.  CK14 38.G, EKG personally viewed and interpreted showing sinus tachycardia at 111 with no acute ST-T wave changes. Chest x-ray with possible right lower lobe airspace opacity as further detailed below: IMPRESSION: Right lower lung zone airspace opacity. Followup PA and lateral chest X-ray is  recommended in 3-4 weeks following trial of antibiotic therapy to ensure resolution and exclude underlying malignancy.   Patient started on sepsis fluids and given Rocephin for suspected UTI Hospitalist consulted for admission.      Assessment and Plan:  Severe sepsis with acute hypoxic respiratory failure. met criteria on admission w/ tachycardia and tachypnea with soft blood pressure, AKI, AMS, lactic acidosis with elevated procalcitonin  Possibly secondary to COVID19 pneumonia with bacterial co-infection vs UTI.  Patient had an elevated Pro calcitonin levels > 4 Severe sepsis resolved and patient has completed antibiotic therapy. Rocephin + Zithromax/Unasyn/Zosyn Developed increased oxygen requirements on 08/23 and has been on increasing levels of HFNC as well as Bipap Her oxygen requirement has decreased from HFNC, 90% FiO2 to 65% FiO2/50L over the last 24 hours  Continue oxygen supplementation to maintain pulse oximetry greater than 92%       Acute metabolic encephalopathy Delirium Probably related to COVID-19 infection as well as prolonged hospitalization with ICU stay Patient started on low-dose Seroquel Mental status continues to wax and wane.  She is only oriented to person today during rounds     Acute systolic CHF CXR showed asymmetric interstitial and airspace opacities in both lungs are similar to the prior study. 2D echocardiogram shows an LVEF of 30 to 35%.  Left ventricle demonstrates global hypokinesis.  LV diastolic parameters are consistent with grade 1 diastolic dysfunction. BNP is elevated at 875 Patient not on GDMT due to soft blood pressures Will reconsult Cardiology Will  give an extra dose of Furosemide 20mg  IVP     COVID19 pneumonia: w/ vs. possible co-bacterial infection.  Pro-cal was elevated at 4.12.  Continue on bronchodilators & encourage incentive spirometry.  CTA chest neg for PE but showed multifocal pneumonia Patient has completed a course  of Zosyn, Rocephin and Zithromax. Continue systemic steroids but decrease to 40 mg IV daily      NSTEMI:  Etiology unclear, possibly secondary to COVID19.  Troponin >3,000 but trending down.  Continue on statin, aspirin.  D/c IV heparin drip.  BP & HR will not tolerate GDMT as per cardio.  Echo shows LVEF is severely depressed The proximal 1/4 of LV thickenes but the  rest of ventricle is severely hypokinetic / akinetiic Appearance of Takotsubo's cardiomyopathy. The left ventricle has severely decreased  function. Akinesis/dyskineiss of distal 2/3 of RV with aneurysmal dilitation in mid region. Cardio signed off 04/17/23 and will see as an outpatient.       Left lower extremity DVT Lower extremity ultrasound shows occlusive thrombus identified within the left peroneal vein as well as the left gastrocnemius vein. No evidence of deep venous thrombosis in the right lower extremity. Continue heparin drip         R/o Dysphagia:  started back on regular diet 04/15/23 as per speech         Urinary retention: UTI:  Bladder scan showed 800 mL, Foley catheter inserted on 8/20. Will do a voiding trial prior to d/c Hx pseudomonas and ESBL E. coli UTI 11/2021. Urine cx shows no growth. Completed abx course        AKI :  Resolved     Rhabdomyolysis:  Unknown whether traumatic or not. CK WNL. Resolved.        Normocytic anemia:  H&H is stable No need for a transfusion currently      Hx of CVA:  continue on aspirin, statin      Depression severity unknown. Continue on home dose of trazodone    Constipation:  Discontinue Colace and MiraLAX due to loose stools Hold Aricept which can also contribute to diarrhea       Unstageable Pressure Injury; coccyx Appreciate wound care input Pressure Injury POA: Yes Measurement: 5cm x 6cm x 0cm  Wound bed:100% black/grey soft eschar  Drainage (amount, consistency, odor) scant, no odor Periwound: intact  Dressing  procedure/placement/frequency: 1. Cleanse sacral wound with saline, pat dry 2. Apply 1/4" thick layer of Santyl to the wound bed, top with saline moist gauze dressing.  3. Top with dry dressing, change daily Add low air loss mattress for moisture management and pressure redistribution Add RD consultation for wound supplementation              Consultants: Cardiology, Critical care Procedures performed: Echocardiogram  Disposition:  Transfer to [facility] Diet recommendation:  Cardiac diet DISCHARGE MEDICATION:   Follow-up Information     Custovic, Rozell Searing, DO. Go in 1 week(s).   Specialty: Cardiology Contact information: [address] [place] [address] [phone]                Discharge Exam: Teresa Sherman Weights   04/16/23 0000 04/19/23 0027 04/22/23 0700  Weight: 58.6 kg 56.6 kg 58.1 kg  General: Acute on chronically ill-appearing frail elderly female, sitting in bed, on heated high flow nasal cannula, in no acute distress HENT: Atraumatic, normocephalic, neck supple, no JVD Lungs: Coarse breath sounds throughout, even, nonlabored, normal effort Cardiovascular: Regular rate and rhythm, S1-S2, no murmurs,  rubs, gallops Abdomen: Soft, nontender, nondistended, no guarding or rebound tenderness, bowel sounds positive x 4 Extremities: Generalized weakness, no deformities, no edema Neuro: Awake and alert, oriented only to self, moves all extremities to commands, no focal deficits, pupils PERRLA GU: Foley catheter in place draining yellow urine   Condition at discharge: serious  The results of significant diagnostics from this hospitalization (including imaging, microbiology, ancillary and laboratory) are listed below for reference.   Imaging Studies: ECHOCARDIOGRAM COMPLETE  Result Date: 04/22/2023    ECHOCARDIOGRAM REPORT   Patient Name:   Teresa Sherman [facility] Date of Exam: 04/22/2023 Medical Rec #:  [id number]             Height:       60.0 in Accession #:     [id number]            Weight:       128.1 lb Date of Birth:  02/11/1946             BSA:          1.545 m Patient Age:    77 years              BP:           104/64 mmHg Patient Gender: F                     HR:           91 bpm. Exam Location:  [facility] Procedure: 2D Echo, Cardiac Doppler, Color Doppler and Strain Analysis Indications:     Chest pain R07.9  History:         Patient has prior history of Echocardiogram examinations, most                  recent 04/17/2023. Previous Myocardial Infarction;                  Signs/Symptoms:Murmur.  Sonographer:     Cristela Blue Referring Phys:  [id number] [facility] Diagnosing Phys: Julien Nordmann MD  Sonographer Comments: Global longitudinal strain was attempted. IMPRESSIONS  1. Left ventricular ejection fraction, by estimation, is 30 to 35%. Left ventricular ejection fraction by 3D volume is 31 %. Left ventricular ejection fraction by PLAX is 34 %. The left ventricle has moderately decreased function. The left ventricle demonstrates global hypokinesis. Left ventricular diastolic parameters are consistent with Grade I diastolic dysfunction (impaired relaxation).  2. Right ventricular systolic function is mildly reduced. The right ventricular size is normal.  3. The mitral valve is normal in structure. Mild mitral valve regurgitation. No evidence of mitral stenosis.  4. The aortic valve has an indeterminant number of cusps. Aortic valve regurgitation is moderate. No aortic stenosis is present.  5. The inferior vena cava is normal in size with greater than 50% respiratory variability, suggesting right atrial pressure of 3 mmHg. FINDINGS  Left Ventricle: Left ventricular ejection fraction, by estimation, is 30 to 35%. Left ventricular ejection fraction by PLAX is 34 %. Left ventricular ejection fraction by 3D volume is 31 %. The left ventricle has moderately decreased function. The left ventricle demonstrates global hypokinesis. The left ventricular internal cavity size was normal  in size. There is no left ventricular hypertrophy. Left ventricular diastolic parameters are consistent with Grade I diastolic dysfunction (impaired relaxation). Right Ventricle: The right ventricular size is normal. No increase in right ventricular wall thickness. Right ventricular systolic function is mildly reduced. Left Atrium: Left atrial size  was normal in size. Right Atrium: Right atrial size was normal in size. Pericardium: There is no evidence of pericardial effusion. Mitral Valve: The mitral valve is normal in structure. Mild mitral valve regurgitation. No evidence of mitral valve stenosis. Tricuspid Valve: The tricuspid valve is normal in structure. Tricuspid valve regurgitation is not demonstrated. No evidence of tricuspid stenosis. Aortic Valve: The aortic valve has an indeterminant number of cusps. Aortic valve regurgitation is moderate. No aortic stenosis is present. Aortic valve mean gradient measures 3.0 mmHg. Aortic valve peak gradient measures 5.7 mmHg. Aortic valve area, by VTI measures 3.35 cm. Pulmonic Valve: The pulmonic valve was normal in structure. Pulmonic valve regurgitation is not visualized. No evidence of pulmonic stenosis. Aorta: The aortic root is normal in size and structure. Venous: The inferior vena cava is normal in size with greater than 50% respiratory variability, suggesting right atrial pressure of 3 mmHg. IAS/Shunts: No atrial level shunt detected by color flow Doppler.  LEFT VENTRICLE PLAX 2D LV EF:         Left            Diastology                ventricular     LV e' medial:    4.35 cm/s                ejection        LV E/e' medial:  13.6                fraction by     LV e' lateral:   5.98 cm/s                PLAX is 34      LV E/e' lateral: 9.9                %. LVIDd:         4.40 cm         2D LVIDs:         3.70 cm         Longitudinal LV PW:         1.00 cm         Strain LV IVS:        0.70 cm         2D Strain GLS  -9.3 % LVOT diam:     2.00 cm         Avg: LV  SV:         54 LV SV Index:   35              3D Volume EF LVOT Area:     3.14 cm        LV 3D EF:    Left                                             ventricul                                             ar LV Volumes (MOD)                            ejection  LV vol d, MOD    84.8 ml                    fraction A2C:                                        by 3D LV vol d, MOD    79.1 ml                    volume is A4C:                                        31 %. LV vol s, MOD    45.2 ml A2C: LV vol s, MOD    55.4 ml       3D Volume EF: A4C:                           3D EF:        31 % LV SV MOD A2C:   39.6 ml LV SV MOD A4C:   79.1 ml LV SV MOD BP:    28.8 ml RIGHT VENTRICLE RV Basal diam:  3.80 cm RV Mid diam:    3.50 cm RV S prime:     15.00 cm/s TAPSE (M-mode): 2.6 cm LEFT ATRIUM           Index        RIGHT ATRIUM           Index LA diam:      2.10 cm 1.36 cm/m   RA Area:     12.20 cm LA Vol (A2C): 39.9 ml 25.83 ml/m  RA Volume:   31.00 ml  20.07 ml/m LA Vol (A4C): 9.8 ml  6.32 ml/m  AORTIC VALVE AV Area (Vmax):    2.67 cm AV Area (Vmean):   2.58 cm AV Area (VTI):     3.35 cm AV Vmax:           119.00 cm/s AV Vmean:          77.100 cm/s AV VTI:            0.162 m AV Peak Grad:      5.7 mmHg AV Mean Grad:      3.0 mmHg LVOT Vmax:         101.00 cm/s LVOT Vmean:        63.300 cm/s LVOT VTI:          0.173 m LVOT/AV VTI ratio: 1.07  AORTA Ao Root diam: 3.20 cm MITRAL VALVE                TRICUSPID VALVE MV Area (PHT): 6.37 cm     TR Peak grad:   14.3 mmHg MV Decel Time: 119 msec     TR Vmax:        189.00 cm/s MV E velocity: 59.20 cm/s MV A velocity: 107.00 cm/s  SHUNTS MV E/A ratio:  0.55         Systemic VTI:  0.17 m                             Systemic Diam: 2.00 cm Julien Nordmann MD Electronically signed  by Julien Nordmann MD Signature Date/Time: 04/22/2023/5:05:01 PM    Final    US Venous Img Lower Bilateral (DVT)  Result Date: 04/22/2023 CLINICAL DATA:  Edema of the lower extremities EXAM:  BILATERAL LOWER EXTREMITY VENOUS DOPPLER ULTRASOUND TECHNIQUE: Gray-scale sonography with graded compression, as well as color Doppler and duplex ultrasound were performed to evaluate the lower extremity deep venous systems from the level of the common femoral vein and including the common femoral, femoral, profunda femoral, popliteal and calf veins including the posterior tibial, peroneal and gastrocnemius veins when visible. The superficial great saphenous vein was also interrogated. Spectral Doppler was utilized to evaluate flow at rest and with distal augmentation maneuvers in the common femoral, femoral and popliteal veins. COMPARISON:  None Available. FINDINGS: RIGHT LOWER EXTREMITY Common Femoral Vein: No evidence of thrombus. Normal compressibility, respiratory phasicity and response to augmentation. Saphenofemoral Junction: No evidence of thrombus. Normal compressibility and flow on color Doppler imaging. Profunda Femoral Vein: No evidence of thrombus. Normal compressibility and flow on color Doppler imaging. Femoral Vein: No evidence of thrombus. Normal compressibility, respiratory phasicity and response to augmentation. Popliteal Vein: No evidence of thrombus. Normal compressibility, respiratory phasicity and response to augmentation. Calf Veins: No evidence of thrombus. Normal compressibility and flow on color Doppler imaging. Superficial Great Saphenous Vein: No evidence of thrombus. Normal compressibility. Venous Reflux:  None. Other Findings:  None. LEFT LOWER EXTREMITY Common Femoral Vein: No evidence of thrombus. Normal compressibility, respiratory phasicity and response to augmentation. Saphenofemoral Junction: No evidence of thrombus. Normal compressibility and flow on color Doppler imaging. Profunda Femoral Vein: No evidence of thrombus. Normal compressibility and flow on color Doppler imaging. Femoral Vein: No evidence of thrombus. Normal compressibility, respiratory phasicity and response to  augmentation. Popliteal Vein: No evidence of thrombus. Normal compressibility, respiratory phasicity and response to augmentation. Calf Veins: Occlusive thrombus identified within the peroneal vein as well as the gastrocnemius vein. Superficial Great Saphenous Vein: No evidence of thrombus. Normal compressibility. Venous Reflux:  None. Other Findings:  None. IMPRESSION: 1. Exam positive for left lower extremity DVT. Occlusive thrombus identified within the left peroneal vein as well as the left gastrocnemius vein. 2. No evidence of deep venous thrombosis in the right lower extremity. These results will be called to the ordering clinician or representative by the Radiologist Assistant, and communication documented in the PACS or [employer]. Electronically Signed   By: Signa Kell M.D.   On: 04/22/2023 05:10   DG Chest Port 1 View  Result Date: 04/21/2023 CLINICAL DATA:  Acute respiratory failure with hypoxia. Recent COVID infection. EXAM: PORTABLE CHEST 1 VIEW COMPARISON:  Chest x-ray dated April 18, 2023. FINDINGS: Stable cardiomediastinal silhouette with normal heart size. Asymmetric interstitial and airspace opacities in both lungs are similar to the prior study. No pleural effusion or pneumothorax. No acute osseous abnormality. IMPRESSION: 1. Unchanged multifocal pneumonia. Electronically Signed   By: Obie Dredge M.D.   On: 04/21/2023 10:18   DG Chest Port 1 View  Result Date: 04/19/2023 CLINICAL DATA:  Cough and hypoxia, COVID-19 positivity, initial encounter EXAM: PORTABLE CHEST 1 VIEW COMPARISON:  Film from earlier in the same day. FINDINGS: Cardiac shadow is stable. Diffuse bilateral airspace opacities are seen slightly improved when compared with the prior exam. No effusion is noted. No bony abnormality is noted. IMPRESSION: Diffuse bilateral airspace opacity slightly improved when compared with the prior exam. Electronically Signed   By: Alcide Clever M.D.   On: 04/19/2023 00:45   DG  Chest  Port 1 View  Result Date: 04/18/2023 CLINICAL DATA:  Dyspnea.  Recent history of COVID. EXAM: PORTABLE CHEST 1 VIEW COMPARISON:  04/15/2023 and CT chest 04/16/2023 FINDINGS: New confluent airspace densities in the left mid lung. Persistent airspace disease throughout the right lung which is more dense particularly in the right perihilar region. Heart size is within normal limits and stable. Probable small pleural effusions based on previous CT findings. No acute bone abnormality. Postsurgical changes in the lumbar spine are only partially imaged. IMPRESSION: 1. Increased bilateral airspace disease, particularly in the left lung. Findings are most compatible with multifocal pneumonia. Electronically Signed   By: Richarda Overlie M.D.   On: 04/18/2023 10:39   ECHOCARDIOGRAM COMPLETE BUBBLE STUDY  Result Date: 04/17/2023    ECHOCARDIOGRAM REPORT   Patient Name:   Teresa Sherman [facility] Date of Exam: 04/17/2023 Medical Rec #:  [id number]             Height:       60.0 in Accession #:    [id number]            Weight:       129.2 lb Date of Birth:  28-Apr-1946             BSA:          1.550 m Patient Age:    77 years              BP:           104/79 mmHg Patient Gender: F                     HR:           96 bpm. Exam Location:  [facility] Procedure: 2D Echo and Saline Contrast Bubble Study Indications:     NSTEMI  History:         Patient has no prior history of Echocardiogram examinations.  Sonographer:     Overton Mam RDCS, FASE Referring Phys:  [id number] BRENDA MORRISON Diagnosing Phys: Dietrich Pates MD IMPRESSIONS  1. LVEF is severely depressed The proximal 1/4 of LV thickenes but the rest of ventricle is severely hypokinetic / akinetiic Appearance of Takotsubo's cardiomyopathy.. The left ventricle has severely decreased function.  2. Akinesis/dyskineiss of distal 2/3 of RV with aneurysmal dilitation in mid region . Right ventricular systolic function is moderately reduced. The right ventricular size is normal.  3. The  mitral valve is normal in structure. Mild mitral valve regurgitation.  4. The aortic valve is tricuspid. Aortic valve regurgitation is moderate.  5. The inferior vena cava is dilated in size with <50% respiratory variability, suggesting right atrial pressure of 15 mmHg.  6. Agitated saline contrast bubble study was negative, with no evidence of any interatrial shunt. FINDINGS  Left Ventricle: LVEF is severely depressed The proximal 1/4 of LV thickenes but the rest of ventricle is severely hypokinetic / akinetiic Appearance of Takotsubo's cardiomyopathy. The left ventricle has severely decreased function. The left ventricular internal cavity size was normal in size. There is no left ventricular hypertrophy. Right Ventricle: Akinesis/dyskineiss of distal 2/3 of RV with aneurysmal dilitation in mid region. The right ventricular size is normal. Right vetricular wall thickness was not assessed. Right ventricular systolic function is moderately reduced. Left Atrium: Left atrial size was normal in size. Right Atrium: Right atrial size was normal in size. Pericardium: There is no evidence of pericardial effusion. Mitral Valve: The mitral valve is normal in structure. Mild mitral  valve regurgitation. Tricuspid Valve: The tricuspid valve is normal in structure. Tricuspid valve regurgitation is trivial. Aortic Valve: The aortic valve is tricuspid. Aortic valve regurgitation is moderate. Aortic regurgitation PHT measures 465 msec. Aortic valve peak gradient measures 5.3 mmHg. Pulmonic Valve: The pulmonic valve was not well visualized. Pulmonic valve regurgitation is not visualized. Aorta: The aortic root is normal in size and structure. Venous: The inferior vena cava is dilated in size with less than 50% respiratory variability, suggesting right atrial pressure of 15 mmHg. IAS/Shunts: No atrial level shunt detected by color flow Doppler. Agitated saline contrast was given intravenously to evaluate for intracardiac shunting.  Agitated saline contrast bubble study was negative, with no evidence of any interatrial shunt.  LEFT VENTRICLE PLAX 2D LVIDd:         4.20 cm     Diastology LVIDs:         3.10 cm     LV e' medial:    9.36 cm/s LV PW:         0.80 cm     LV E/e' medial:  7.8 LV IVS:        0.70 cm     LV e' lateral:   18.20 cm/s LVOT diam:     1.80 cm     LV E/e' lateral: 4.0 LV SV:         46 LV SV Index:   29 LVOT Area:     2.54 cm  LV Volumes (MOD) LV vol d, MOD A2C: 63.6 ml LV vol d, MOD A4C: 70.9 ml LV vol s, MOD A2C: 40.7 ml LV vol s, MOD A4C: 39.8 ml LV SV MOD A2C:     22.9 ml LV SV MOD A4C:     70.9 ml LV SV MOD BP:      28.9 ml RIGHT VENTRICLE RV Basal diam:  2.50 cm LEFT ATRIUM             Index        RIGHT ATRIUM           Index LA diam:        2.40 cm 1.55 cm/m   RA Area:     10.20 cm LA Vol (A2C):   33.9 ml 21.87 ml/m  RA Volume:   21.50 ml  13.87 ml/m LA Vol (A4C):   37.8 ml 24.38 ml/m LA Biplane Vol: 35.7 ml 23.03 ml/m  AORTIC VALVE                 PULMONIC VALVE AV Area (Vmax): 2.30 cm     PV Vmax:       0.85 m/s AV Vmax:        115.00 cm/s  PV Peak grad:  2.9 mmHg AV Peak Grad:   5.3 mmHg LVOT Vmax:      104.00 cm/s LVOT Vmean:     67.700 cm/s LVOT VTI:       0.179 m AI PHT:         465 msec  AORTA Ao Root diam: 3.50 cm MITRAL VALVE MV Area (PHT): 7.90 cm    SHUNTS MV Decel Time: 96 msec     Systemic VTI:  0.18 m MV E velocity: 72.80 cm/s  Systemic Diam: 1.80 cm MV A velocity: 71.80 cm/s MV E/A ratio:  1.01 Dietrich Pates MD Electronically signed by Dietrich Pates MD Signature Date/Time: 04/17/2023/6:21:37 PM    Final    US Venous Img Lower Bilateral (DVT)  Result  Date: 04/16/2023 CLINICAL DATA:  77 year old female with history of elevated D-dimer. EXAM: BILATERAL LOWER EXTREMITY VENOUS DOPPLER ULTRASOUND TECHNIQUE: Gray-scale sonography with graded compression, as well as color Doppler and duplex ultrasound were performed to evaluate the lower extremity deep venous systems from the level of the common femoral  vein and including the common femoral, femoral, profunda femoral, popliteal and calf veins including the posterior tibial, peroneal and gastrocnemius veins when visible. The superficial great saphenous vein was also interrogated. Spectral Doppler was utilized to evaluate flow at rest and with distal augmentation maneuvers in the common femoral, femoral and popliteal veins. COMPARISON:  None Available. FINDINGS: RIGHT LOWER EXTREMITY Common Femoral Vein: No evidence of thrombus. Normal compressibility, respiratory phasicity and response to augmentation. Saphenofemoral Junction: No evidence of thrombus. Normal compressibility and flow on color Doppler imaging. Profunda Femoral Vein: No evidence of thrombus. Normal compressibility and flow on color Doppler imaging. Femoral Vein: No evidence of thrombus. Normal compressibility, respiratory phasicity and response to augmentation. Popliteal Vein: No evidence of thrombus. Normal compressibility, respiratory phasicity and response to augmentation. Calf Veins: No evidence of thrombus. Normal compressibility and flow on color Doppler imaging. Superficial Great Saphenous Vein: No evidence of thrombus. Normal compressibility. Venous Reflux:  None. Other Findings:  None. LEFT LOWER EXTREMITY Common Femoral Vein: No evidence of thrombus. Normal compressibility, respiratory phasicity and response to augmentation. Saphenofemoral Junction: No evidence of thrombus. Normal compressibility and flow on color Doppler imaging. Profunda Femoral Vein: No evidence of thrombus. Normal compressibility and flow on color Doppler imaging. Femoral Vein: No evidence of thrombus. Normal compressibility, respiratory phasicity and response to augmentation. Popliteal Vein: No evidence of thrombus. Normal compressibility, respiratory phasicity and response to augmentation. Calf Veins: No evidence of thrombus. Normal compressibility and flow on color Doppler imaging. Superficial Great Saphenous Vein: No  evidence of thrombus. Normal compressibility. Venous Reflux:  None. Other Findings:  None. IMPRESSION: No evidence of deep venous thrombosis in either lower extremity. Electronically Signed   By: Trudie Reed M.D.   On: 04/16/2023 05:03   CT Angio Chest Pulmonary Embolism (PE) W or WO Contrast  Result Date: 04/16/2023 CLINICAL DATA:  Sepsis following COVID-19 infection with difficulty breathing, evaluate for pulmonary emboli EXAM: CT ANGIOGRAPHY CHEST WITH CONTRAST TECHNIQUE: Multidetector CT imaging of the chest was performed using the standard protocol during bolus administration of intravenous contrast. Multiplanar CT image reconstructions and MIPs were obtained to evaluate the vascular anatomy. RADIATION DOSE REDUCTION: This exam was performed according to the departmental dose-optimization program which includes automated exposure control, adjustment of the mA and/or kV according to patient size and/or use of iterative reconstruction technique. CONTRAST:  75mL OMNIPAQUE IOHEXOL 350 MG/ML SOLN COMPARISON:  Chest x-ray from the previous day. FINDINGS: Cardiovascular: Thoracic aorta shows normal branching pattern. No aneurysmal dilatation is noted. No cardiac enlargement is seen. Pulmonary artery is well visualized within normal branching pattern bilaterally. No filling defect to suggest pulmonary embolism is noted. Mediastinum/Nodes: Thoracic inlet is within normal limits. No hilar or mediastinal adenopathy is noted. The esophagus as visualized is within normal limits. Small sliding-type hiatal hernia is noted. Lungs/Pleura: Small effusions are noted bilaterally right slightly greater than left. Patchy airspace opacities are noted in both lungs greater on the right than the left. No parenchymal nodules are seen. Upper Abdomen: Remainder of the upper abdomen is within normal limits. Musculoskeletal: No acute rib fractures are seen. T8, T11 and T12 compression fractures are noted which appear chronic in  nature. Review of the MIP images  confirms the above findings. IMPRESSION: No evidence of pulmonary emboli. Bilateral effusions with patchy airspace opacities right greater than left. Old compression deformities as described. Electronically Signed   By: Alcide Clever M.D.   On: 04/16/2023 00:51   DG Chest Port 1 View  Result Date: 04/15/2023 CLINICAL DATA:  Shortness of breath.  Hypoxia.  COVID positive. EXAM: PORTABLE CHEST 1 VIEW COMPARISON:  Radiograph yesterday FINDINGS: Shifting bilateral pulmonary opacities, more extensive on the right. There is some improvement in the diffuse right lung opacity, however worsening in the perihilar and left basilar opacity. Stable heart size and mediastinal contours. Possible small pleural effusions. No pneumothorax. IMPRESSION: Shifting bilateral pulmonary opacities, more extensive on the right. There is some improvement in the diffuse right lung opacity, however worsening in the left perihilar and basilar opacity. Findings are suspicious for multifocal pneumonia. Electronically Signed   By: Narda Rutherford M.D.   On: 04/15/2023 22:41   DG Chest Port 1 View  Result Date: 04/14/2023 CLINICAL DATA:  Dyspnea EXAM: PORTABLE CHEST 1 VIEW COMPARISON:  04/11/2023 FINDINGS: There has developed extensive airspace infiltrate throughout the right lung in keeping with progressive pneumonic infiltrate. Left lung is clear. No pneumothorax or pleural effusion. Cardiac size within normal limits. Pulmonary vascularity is normal. No acute bone abnormality. IMPRESSION: 1. Progressive, extensive right-sided pneumonic infiltrate. Follow-up chest radiograph is recommended in 3-4 weeks to document complete resolution. Electronically Signed   By: Helyn Numbers M.D.   On: 04/14/2023 03:23   CT HEAD WO CONTRAST ( )  Result Date: 04/11/2023 CLINICAL DATA:  Mental status change, unknown cause EXAM: CT HEAD WITHOUT CONTRAST TECHNIQUE: Contiguous axial images were obtained from the base of  the skull through the vertex without intravenous contrast. RADIATION DOSE REDUCTION: This exam was performed according to the departmental dose-optimization program which includes automated exposure control, adjustment of the mA and/or kV according to patient size and/or use of iterative reconstruction technique. COMPARISON:  MRI head 11/30/2021 FINDINGS: Brain: No evidence of large-territorial acute infarction. No parenchymal hemorrhage. No mass lesion. No extra-axial collection. No mass effect or midline shift. No hydrocephalus. Basilar cisterns are patent. Vascular: No hyperdense vessel. Skull: No acute fracture or focal lesion. Sinuses/Orbits: Paranasal sinuses and mastoid air cells are clear. The orbits are unremarkable. Other: None. IMPRESSION: No acute intracranial abnormality. Electronically Signed   By: Tish Frederickson M.D.   On: 04/11/2023 21:31   DG Chest 1 View  Result Date: 04/11/2023 CLINICAL DATA:  [id number] Sepsis (HCC) [id number] l. Pt normally oriented x4 per EMS, oriented to self and time today. EMS reports possible UTI. Pt does have congested cough, urine is foul smelling. EXAM: CHEST  1 VIEW COMPARISON:  Chest x-ray 12/05/2021 FINDINGS: The heart and mediastinal contours are within normal limits. Right lower lung zone airspace opacity. No pulmonary edema. No pleural effusion. No pneumothorax. No acute osseous abnormality. IMPRESSION: Right lower lung zone airspace opacity. Followup PA and lateral chest X-ray is recommended in 3-4 weeks following trial of antibiotic therapy to ensure resolution and exclude underlying malignancy. Electronically Signed   By: Tish Frederickson M.D.   On: 04/11/2023 18:39    Microbiology: Results for orders placed or performed during the hospital encounter of 04/11/23  Culture, blood (Routine x 2)     Status: Abnormal   Collection Time: 04/11/23  5:04 PM   Specimen: BLOOD  Result Value Ref Range Status   Specimen Description   Final    BLOOD RIGHT  ANTECUBITAL Performed at [facility], [address] [place], [place] [address]    Special Requests   Final    BOTTLES DRAWN AEROBIC AND ANAEROBIC Blood Culture adequate volume Performed at [facility], [address] [place], [place] [address]    Culture  Setup Time   Final    AEROBIC BOTTLE ONLY GRAM POSITIVE COCCI CRITICAL RESULT CALLED TO, READ BACK BY AND VERIFIED WITH: TREY GREENWOOD 04/12/23 1151 MW    Culture (A)  Final    STAPHYLOCOCCUS HOMINIS THE SIGNIFICANCE OF ISOLATING THIS ORGANISM FROM A SINGLE SET OF BLOOD CULTURES WHEN MULTIPLE SETS ARE DRAWN IS UNCERTAIN. PLEASE NOTIFY THE MICROBIOLOGY DEPARTMENT WITHIN ONE WEEK IF SPECIATION AND SENSITIVITIES ARE REQUIRED. Performed at [facility], [address], [place], [place] [address]    Report Status 04/13/2023 FINAL  Final  Blood Culture ID Panel (Reflexed)     Status: Abnormal   Collection Time: 04/11/23  5:04 PM  Result Value Ref Range Status   Enterococcus faecalis NOT DETECTED NOT DETECTED Final   Enterococcus Faecium NOT DETECTED NOT DETECTED Final   Listeria monocytogenes NOT DETECTED NOT DETECTED Final   Staphylococcus species DETECTED (A) NOT DETECTED Final    Comment: CRITICAL RESULT CALLED TO, READ BACK BY AND VERIFIED WITH: TREY GREENWOOD 04/12/23 1151 MW    Staphylococcus aureus (BCID) NOT DETECTED NOT DETECTED Final   Staphylococcus epidermidis NOT DETECTED NOT DETECTED Final   Staphylococcus lugdunensis NOT DETECTED NOT DETECTED Final   Streptococcus species NOT DETECTED NOT DETECTED Final   Streptococcus agalactiae NOT DETECTED NOT DETECTED Final   Streptococcus pneumoniae NOT DETECTED NOT DETECTED Final   Streptococcus pyogenes NOT DETECTED NOT DETECTED Final   A.calcoaceticus-baumannii NOT DETECTED NOT DETECTED Final   Bacteroides fragilis NOT DETECTED NOT DETECTED Final   Enterobacterales NOT DETECTED NOT DETECTED Final   Enterobacter cloacae complex NOT DETECTED NOT  DETECTED Final   Escherichia coli NOT DETECTED NOT DETECTED Final   Klebsiella aerogenes NOT DETECTED NOT DETECTED Final   Klebsiella oxytoca NOT DETECTED NOT DETECTED Final   Klebsiella pneumoniae NOT DETECTED NOT DETECTED Final   Proteus species NOT DETECTED NOT DETECTED Final   Salmonella species NOT DETECTED NOT DETECTED Final   Serratia marcescens NOT DETECTED NOT DETECTED Final   Haemophilus influenzae NOT DETECTED NOT DETECTED Final   Neisseria meningitidis NOT DETECTED NOT DETECTED Final   Pseudomonas aeruginosa NOT DETECTED NOT DETECTED Final   Stenotrophomonas maltophilia NOT DETECTED NOT DETECTED Final   Candida albicans NOT DETECTED NOT DETECTED Final   Candida auris NOT DETECTED NOT DETECTED Final   Candida glabrata NOT DETECTED NOT DETECTED Final   Candida krusei NOT DETECTED NOT DETECTED Final   Candida parapsilosis NOT DETECTED NOT DETECTED Final   Candida tropicalis NOT DETECTED NOT DETECTED Final   Cryptococcus neoformans/gattii NOT DETECTED NOT DETECTED Final    Comment: Performed at [facility], [address] [place], [place] [address]  SARS Coronavirus 2 by RT PCR (hospital order, performed in [facility] hospital lab) *cepheid single result test* Anterior Nasal Swab     Status: Abnormal   Collection Time: 04/11/23  5:16 PM   Specimen: Anterior Nasal Swab  Result Value Ref Range Status   SARS Coronavirus 2 by RT PCR POSITIVE (A) NEGATIVE Final    Comment: (NOTE) SARS-CoV-2 target nucleic acids are DETECTED  SARS-CoV-2 RNA is generally detectable in upper respiratory specimens  during the acute phase of infection.  Positive results are indicative  of the presence of  the identified virus, but do not rule out bacterial infection or co-infection with other pathogens not detected by the test.  Clinical correlation with patient history and  other diagnostic information is necessary to determine patient infection status.  The expected result is  negative.  Fact Sheet for Patients:   RoadLapTop.co.za   Fact Sheet for Healthcare Providers:   http://kim-miller.com/    This test is not yet approved or cleared by the [place] FDA and  has been authorized for detection and/or diagnosis of SARS-CoV-2 by FDA under an Emergency Use Authorization (EUA).  This EUA will remain in effect (meaning this test can be used) for the duration of  the COVID-19 declaration under Section 564(b)(1)  of the Act, 21 U.S.C. section 360-bbb-3(b)(1), unless the authorization is terminated or revoked sooner.   Performed at [facility], [address] [place], [place] [address]   Culture, blood (Routine x 2)     Status: None   Collection Time: 04/11/23  7:18 PM   Specimen: BLOOD  Result Value Ref Range Status   Specimen Description BLOOD BLOOD RIGHT ARM  Final   Special Requests   Final    BOTTLES DRAWN AEROBIC AND ANAEROBIC Blood Culture adequate volume   Culture   Final    NO GROWTH 5 DAYS Performed at [facility], [address], [place], [place] [address]    Report Status 04/16/2023 FINAL  Final  Urine Culture (for pregnant, neutropenic or urologic patients or patients with an indwelling urinary catheter)     Status: None   Collection Time: 04/12/23  7:30 PM   Specimen: Urine, Clean Catch  Result Value Ref Range Status   Specimen Description   Final    URINE, CLEAN CATCH Performed at [facility], [address] [place], [place] [address]    Special Requests   Final    NONE Performed at [facility], [address], [place], [place] [address]    Culture   Final    NO GROWTH Performed at [facility], [address], [place], [place] [address]    Report Status 04/13/2023 FINAL  Final  Gastrointestinal Panel by PCR , Stool     Status: None   Collection Time: 04/13/23  8:05 AM   Specimen: Stool  Result Value Ref Range Status    Campylobacter species NOT DETECTED NOT DETECTED Final   Plesimonas shigelloides NOT DETECTED NOT DETECTED Final   Salmonella species NOT DETECTED NOT DETECTED Final   Yersinia enterocolitica NOT DETECTED NOT DETECTED Final   Vibrio species NOT DETECTED NOT DETECTED Final   Vibrio cholerae NOT DETECTED NOT DETECTED Final   Enteroaggregative E coli (EAEC) NOT DETECTED NOT DETECTED Final   Enteropathogenic E coli (EPEC) NOT DETECTED NOT DETECTED Final   Enterotoxigenic E coli (ETEC) NOT DETECTED NOT DETECTED Final   Shiga like toxin producing E coli (STEC) NOT DETECTED NOT DETECTED Final   Shigella/Enteroinvasive E coli (EIEC) NOT DETECTED NOT DETECTED Final   Cryptosporidium NOT DETECTED NOT DETECTED Final   Cyclospora cayetanensis NOT DETECTED NOT DETECTED Final   Entamoeba histolytica NOT DETECTED NOT DETECTED Final   Giardia lamblia NOT DETECTED NOT DETECTED Final   Adenovirus F40/41 NOT DETECTED NOT DETECTED Final   Astrovirus NOT DETECTED NOT DETECTED Final   Norovirus GI/GII NOT DETECTED NOT DETECTED Final   Rotavirus A NOT DETECTED NOT DETECTED Final   Sapovirus (I, II, IV, and V) NOT DETECTED NOT DETECTED Final  Comment: Performed at [facility], [address] [place], [place] [address]  C Difficile Quick Screen w PCR reflex     Status: None   Collection Time: 04/13/23  8:05 AM   Specimen: STOOL  Result Value Ref Range Status   C Diff antigen NEGATIVE NEGATIVE Final   C Diff toxin NEGATIVE NEGATIVE Final   C Diff interpretation No C. difficile detected.  Final    Comment: Performed at [facility], [address] [place], [place] [address]  Culture, blood (Routine X 2) w Reflex to ID Panel     Status: None   Collection Time: 04/13/23  5:11 PM   Specimen: BLOOD  Result Value Ref Range Status   Specimen Description BLOOD BLOOD RIGHT ARM  Final   Special Requests   Final    BOTTLES DRAWN AEROBIC AND ANAEROBIC Blood Culture adequate volume   Culture    Final    NO GROWTH 5 DAYS Performed at [facility], [address] [place], [place] [address]    Report Status 04/18/2023 FINAL  Final  Culture, blood (Routine X 2) w Reflex to ID Panel     Status: None   Collection Time: 04/13/23  5:18 PM   Specimen: BLOOD  Result Value Ref Range Status   Specimen Description BLOOD BLOOD LEFT ARM  Final   Special Requests   Final    BOTTLES DRAWN AEROBIC AND ANAEROBIC Blood Culture results may not be optimal due to an inadequate volume of blood received in culture bottles   Culture   Final    NO GROWTH 5 DAYS Performed at [facility], [address], [place], [place] [address]    Report Status 04/18/2023 FINAL  Final  MRSA Next Gen by PCR, Nasal     Status: None   Collection Time: 04/16/23  1:33 AM   Specimen: Nasal Mucosa; Nasal Swab  Result Value Ref Range Status   MRSA by PCR Next Gen NOT DETECTED NOT DETECTED Final    Comment: (NOTE) The GeneXpert MRSA Assay (FDA approved for NASAL specimens only), is one component of a comprehensive MRSA colonization surveillance program. It is not intended to diagnose MRSA infection nor to guide or monitor treatment for MRSA infections. Test performance is not FDA approved in patients less than 25 years old. Performed at [facility], [address] [place], [place] [address]     Labs: CBC: Recent Labs  Lab 04/15/23 2244 04/16/23 0435 04/17/23 0358 04/19/23 0422 04/20/23 0400 04/21/23 0904 04/22/23 0439  WBC 7.2   < > 8.3 9.1 12.1* 10.8* 12.3*  NEUTROABS 6.0  --   --   --   --   --   --   HGB 11.4*   < > 11.2* 10.6* 10.1* 10.0* 10.1*  HCT 32.5*   < > 32.5* 31.0* 29.5* 30.1* 30.4*  MCV 88.8   < > 89.0 90.4 91.0 90.9 91.3  PLT 177   < > 259 262 246 252 285   < > = values in this interval not displayed.   Basic Metabolic Panel: Recent Labs  Lab 04/16/23 0435 04/17/23 0358 04/18/23 0645 04/19/23 0422 04/20/23 0400 04/21/23 0342 04/21/23 0904  04/22/23 0439  NA 139   < > 140 139 136 137  --  140  K 3.4*   < > 3.8 3.4* 3.3* 3.8  --  3.5  CL 104   < > 107 103 99 104  --  97*  CO2 21*   < > 22 26 29 27   --  27  GLUCOSE 138*   < > 161* 154* 154* 147*  --  142*  BUN 13   < > 30* 37* 35* 26*  --  27*  CREATININE 0.54   < > 0.56 0.63 0.63 0.60  --  0.52  CALCIUM 7.8*   < > 8.1* 7.9* 7.7* 7.6*  --  8.4*  MG 1.9  --  2.2  --  2.4  --  1.9 2.1  PHOS  --   --   --   --   --   --   --  3.1   < > = values in this interval not displayed.   Liver Function Tests: Recent Labs  Lab 04/22/23 0439  ALBUMIN 2.4*   CBG: Recent Labs  Lab 04/15/23 2221 04/16/23 0047 04/19/23 0015  GLUCAP 144* 136* 145*    Discharge time spent: greater than 30 minutes.  Signed: Lucile Shutters, MD [facility] 04/22/2023

## 2023-04-22 NOTE — Progress Notes (Signed)
       CROSS COVER NOTE  NAME: Mayu Rockholt MRN: [id number] DOB : Feb 20, 1946    Concern as stated by nurse / staff    Ultrasound of BLE was just completed and showed an DVT in the LLE (peroneal and gastrocnemius vein).      Pertinent findings on chart review:   Assessment and  Interventions   Assessment:  DVT LLE  Plan: Heparin drip restarted by PCCM X X

## 2023-04-22 NOTE — Progress Notes (Signed)
ANTICOAGULATION CONSULT NOTE - Initial Consult  Pharmacy Consult for Heparin  Indication: DVT  Allergies  Allergen Reactions   Duloxetine Hcl Nausea And Vomiting   Monosodium Glutamate     migraine   Nitrates, Organic     migraine   Sodium Benzoate [Nutritional Supplements]     migraine    Patient Measurements: Height: 5' (152.4 cm) Weight: 56.6 kg (124 lb 12.5 oz) IBW/kg (Calculated) : 45.5 Heparin Dosing Weight: 56.6 kg   Vital Signs: Temp: 97.7 F (36.5 C) (08/30 0300) Temp Source: Axillary (08/30 0300) BP: 110/70 (08/30 0300) Pulse Rate: 74 (08/30 0300)  Labs: Recent Labs    04/20/23 0400 04/21/23 0342 04/21/23 0904 04/22/23 0439  HGB 10.1*  --  10.0* 10.1*  HCT 29.5*  --  30.1* 30.4*  PLT 246  --  252 285  CREATININE 0.63 0.60  --   --     Estimated Creatinine Clearance: 47.9 mL/min (by C-G formula based on SCr of 0.6 mg/dL).   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   unk   aspirin 81 MG chewable tablet Chew 81 mg by mouth daily.   04/10/2023   aspirin-acetaminophen-caffeine (EXCEDRIN MIGRAINE) 250-250-65 MG tablet Take by mouth every 6 (six) hours as needed for headache.   unk   atorvastatin (LIPITOR) 40 MG tablet Take 40 mg by mouth daily.   04/10/2023   Azelastine HCl 137 MCG/SPRAY SOLN Place 1 spray into both nostrils at bedtime.   unk   Biotin 78295 MCG TBDP Take 1 tablet by mouth every morning.   04/10/2023   donepezil (ARICEPT) 10 MG tablet Take 5 MG HS for 4 weeks, then 10 MG HS PO.   04/10/2023   guaiFENesin (ROBITUSSIN) 100 MG/5ML liquid Take 5 mLs by mouth  every 4 (four) hours as needed for cough or to loosen phlegm. 120 mL 0 unk   melatonin 1 MG TABS tablet Take 0.5 mg by mouth at bedtime.   unk   traZODone (DESYREL) 50 MG tablet Take 25 mg by mouth at bedtime as needed for sleep.   unk   minoxidil (LONITEN) 2.5 MG tablet Take 1/2 pill once daily. 30 tablet 1     Assessment: Pharmacy consulted to dose heparin in this 77 year old female admitted with sepsis, now with newly diagnoses DVT.   Pt received lovenox 40 mg SQ on 8/29 @ 2120.   CrCl = 47.9 ml/min   Goal of Therapy:  Heparin level 0.3-0.7 units/ml Monitor platelets by anticoagulation protocol: Yes   Plan:  Give 3400 units bolus x 1 Start heparin infusion at 950 units/hr Check anti-Xa level in 8 hours and daily while on heparin Continue to monitor H&H and platelets  Rena Hunke D 04/22/2023,5:46 AM

## 2023-04-22 NOTE — Progress Notes (Signed)
Discussed patient with intensivist, Dr Marcos Eke at [facility] for possible transfer.  Patient admitted to the hospital on 04/11/23 for severe sepsis from COVID-19 pneumonia with presumed superimposed bacterial infection and suspected UTI.  Prior history of MDR UTI in 04/23.  Patient was initially treated with IV Rocephin and Zithromax and while on the floor developed respiratory failure with an increased oxygen requirement and bumped her troponin as well.  She was transferred to the stepdown unit due to her increased oxygen requirement and hypotension and was seen in consultation by cardiology.  Elevated troponin was presumed to be secondary to demand from sepsis and not ACS and heparin was discontinued.  She was moved to progressive care unit and transferred again to stepdown unit due to increasing oxygen requirement requiring BiPAP and high flow nasal cannula.  2D echocardiogram was done and suggestive of Takotsubo cardiomyopathy. Critical care was consulted on 04/21/23 due to worsening hypoxemia with patient requiring 55 L/90% FiO2 of high flow nasal cannula.  She received IV diuresis for possible CHF and was continued on systemic steroids for acute hypoxic respiratory failure from COVID-19 pneumonia.  Dr. Marcos Eke requested results of the repeat 2D echocardiogram and cardiology reconsultation due to concerns for possible biventricular heart failure.  Repeat 2D echocardiogram shows an LVEF of 30 to 35% and he recommends to continue IV diuresis for now. They do not have any beds available at this time but patient remains on a wait list. Dr Welton Flakes from cardiology has been re consulted

## 2023-04-22 NOTE — Progress Notes (Signed)
During rounds MD notified that patient states she is very scared. She is seeing things falling from the ceiling into her food: (confused/hallucinations). She is also having bad dreams. EKG done patients QTC 534. Orders to not give her Seroquel. Estranged sister at bedside at this time. Continue to assess.

## 2023-04-22 NOTE — Consult Note (Signed)
Teresa Sherman is a 77 y.o. female  [id number]  Primary Cardiologist: Adrian Blackwater Reason for Consultation: Reevaluation for cardiology because of severe LV dysfunction on echocardiogram.  HPI: This is a 77 year old white female who is being admitted with COVID-pneumonia and sepsis and is in respiratory failure with severe hypoxia had abnormal echo finding thus I was asked to evaluate the patient.  Patient states that she is feels little bit short of breath but denies any chest pain or palpitation.  She appears to be slightly confused but was able to verbalize and communicate with me.  There is no orthopnea PND.   Review of Systems: No chest pain   Past Medical History:  Diagnosis Date   Allergy    Anemia 04/11/2023   Anxiety    Cataract    Combined fat and carbohydrate induced hyperlipemia 01/26/2015   Depression    GERD (gastroesophageal reflux disease)     Medications Prior to Admission  Medication Sig Dispense Refill   acetaminophen (TYLENOL) 500 MG tablet Take 1,000 mg by mouth every 6 (six) hours as needed for fever or pain.     aspirin 81 MG chewable tablet Chew 81 mg by mouth daily.     aspirin-acetaminophen-caffeine (EXCEDRIN MIGRAINE) 250-250-65 MG tablet Take by mouth every 6 (six) hours as needed for headache.     atorvastatin (LIPITOR) 40 MG tablet Take 40 mg by mouth daily.     Azelastine HCl 137 MCG/SPRAY SOLN Place 1 spray into both nostrils at bedtime.     Biotin 19147 MCG TBDP Take 1 tablet by mouth every morning.     donepezil (ARICEPT) 10 MG tablet Take 5 MG HS for 4 weeks, then 10 MG HS PO.     guaiFENesin (ROBITUSSIN) 100 MG/5ML liquid Take 5 mLs by mouth every 4 (four) hours as needed for cough or to loosen phlegm. 120 mL 0   melatonin 1 MG TABS tablet Take 0.5 mg by mouth at bedtime.     traZODone (DESYREL) 50 MG tablet Take 25 mg by mouth at bedtime as needed for sleep.     minoxidil (LONITEN) 2.5 MG tablet Take 1/2 pill once daily. 30 tablet 1       vitamin C  500 mg Oral BID   aspirin  81 mg Oral Daily   Chlorhexidine Gluconate Cloth  6 each Topical Daily   collagenase   Topical Daily   guaiFENesin  15 mL Oral TID   melatonin  5 mg Oral QHS   [START ON 04/23/2023] methylPREDNISolone (SOLU-MEDROL) injection  40 mg Intravenous Q24H   multivitamin with minerals  1 tablet Oral Q lunch   rosuvastatin  40 mg Oral Q2200   zinc sulfate  220 mg Oral Daily    Infusions:  heparin 900 Units/hr (04/22/23 1748)   piperacillin-tazobactam (ZOSYN)  IV 12.5 mL/hr at 04/22/23 1716    Allergies  Allergen Reactions   Duloxetine Hcl Nausea And Vomiting   Monosodium Glutamate     migraine   Nitrates, Organic     migraine   Sodium Benzoate [Nutritional Supplements]     migraine    Social History   Socioeconomic History   Marital status: Divorced    Spouse name: Not on file   Number of children: Not on file   Years of education: Not on file   Highest education level: Not on file  Occupational History   Not on file  Tobacco Use   Smoking status: Never  Smokeless tobacco: Never  Vaping Use   Vaping status: Never Used  Substance and Sexual Activity   Alcohol use: No    Alcohol/week: 0.0 standard drinks of alcohol   Drug use: No   Sexual activity: Not Currently  Other Topics Concern   Not on file  Social History Narrative   Not on file   Social Determinants of Health   Financial Resource Strain: Low Risk  (06/14/2021)   Received from [facility], [facility]   Overall Financial Resource Strain (CARDIA)    Difficulty of Paying Living Expenses: Not very hard  Food Insecurity: No Food Insecurity (04/13/2023)   Hunger Vital Sign    Worried About Running Out of Food in the Last Year: Never true    Ran Out of Food in the Last Year: Never true  Transportation Needs: No Transportation Needs (04/13/2023)   PRAPARE - Administrator, Civil Service (Medical): No    Lack of Transportation (Non-Medical): No   Physical Activity: Sufficiently Active (10/30/2021)   Received from [facility]   Exercise Vital Sign  Stress: No Stress Concern Present (10/30/2021)   Received from [facility] - Occupational Stress Questionnaire  Social Connections: Socially Isolated (10/30/2021)   Received from [facility]   Social Connection and Isolation Panel [NHANES]  Intimate Partner Violence: Not At Risk (04/13/2023)   Humiliation, Afraid, Rape, and Kick questionnaire    Fear of Current or Ex-Partner: No    Emotionally Abused: No    Physically Abused: No    Sexually Abused: No    Family History  Problem Relation Age of Onset   COPD Mother    Hypertension Mother    Osteoarthritis Mother    CAD Father    Cancer Brother     PHYSICAL EXAM: Vitals:   04/22/23 1630 04/22/23 1700  BP: (!) 106/93 121/80  Pulse: (!) 102 98  Resp: (!) 22 (!) 25  Temp:  97.6 F (36.4 C)  SpO2: 96% 96%     Intake/Output Summary (Last 24 hours) at 04/22/2023 1801 Last data filed at 04/22/2023 1716 Gross per 24 hour  Intake 918.11 ml  Output 832 ml  Net 86.11 ml    General:  Well appearing. No respiratory difficulty HEENT: normal Neck: supple. no JVD. Carotids 2+ bilat; no bruits. No lymphadenopathy or thryomegaly appreciated. Cor: PMI nondisplaced. Regular rate & rhythm. No rubs, gallops or murmurs. Lungs: clear Abdomen: soft, nontender, nondistended. No hepatosplenomegaly. No bruits or masses. Good bowel sounds. Extremities: no cyanosis, clubbing, rash, edema Neuro: alert & oriented x 3, cranial nerves grossly intact. moves all 4 extremities w/o difficulty. Affect pleasant.  ECG: Normal sinus rhythm with nonspecific ST-T changes.  No ST elevation and no Q waves.  Results for orders placed or performed during the hospital encounter of 04/11/23 (from the past 24 hour(s))  CBC     Status: Abnormal   Collection Time: 04/22/23  4:39 AM  Result Value Ref Range    WBC 12.3 (H) 4.0 - 10.5 K/uL   RBC 3.33 (L) 3.87 - 5.11 MIL/uL   Hemoglobin 10.1 (L) 12.0 - 15.0 g/dL   HCT 44.0 (L) 34.7 - 42.5 %   MCV 91.3 80.0 - 100.0 fL   MCH 30.3 26.0 - 34.0 pg   MCHC 33.2 30.0 - 36.0 g/dL   RDW 95.6 38.7 - 56.4 %   Platelets 285 150 - 400 K/uL  nRBC 0.0 0.0 - 0.2 %  Renal function panel     Status: Abnormal   Collection Time: 04/22/23  4:39 AM  Result Value Ref Range   Sodium 140 135 - 145 mmol/L   Potassium 3.5 3.5 - 5.1 mmol/L   Chloride 97 (L) 98 - 111 mmol/L   CO2 27 22 - 32 mmol/L   Glucose, Bld 142 (H) 70 - 99 mg/dL   BUN 27 (H) 8 - 23 mg/dL   Creatinine, Ser 4.69 0.44 - 1.00 mg/dL   Calcium 8.4 (L) 8.9 - 10.3 mg/dL   Phosphorus 3.1 2.5 - 4.6 mg/dL   Albumin 2.4 (L) 3.5 - 5.0 g/dL   GFR, Estimated >62 >95 mL/min   Anion gap 16 (H) 5 - 15  Procalcitonin     Status: None   Collection Time: 04/22/23  4:39 AM  Result Value Ref Range   Procalcitonin 0.22 ng/mL  C-reactive protein     Status: Abnormal   Collection Time: 04/22/23  4:39 AM  Result Value Ref Range   CRP 2.7 (H) <1.0 mg/dL  Ferritin     Status: None   Collection Time: 04/22/23  4:39 AM  Result Value Ref Range   Ferritin 99 11 - 307 ng/mL  Brain natriuretic peptide     Status: Abnormal   Collection Time: 04/22/23  4:39 AM  Result Value Ref Range   B Natriuretic Peptide 875.1 (H) 0.0 - 100.0 pg/mL  Magnesium     Status: None   Collection Time: 04/22/23  4:39 AM  Result Value Ref Range   Magnesium 2.1 1.7 - 2.4 mg/dL  Heparin level (unfractionated)     Status: Abnormal   Collection Time: 04/22/23  4:30 PM  Result Value Ref Range   Heparin Unfractionated 0.71 (H) 0.30 - 0.70 IU/mL   ECHOCARDIOGRAM COMPLETE  Result Date: 04/22/2023    ECHOCARDIOGRAM REPORT   Patient Name:   AZALEAH ALARID [facility] Date of Exam: 04/22/2023 Medical Rec #:  [id number]             Height:       60.0 in Accession #:    [id number]            Weight:       128.1 lb Date of Birth:  February 06, 1946             BSA:           1.545 m Patient Age:    77 years              BP:           104/64 mmHg Patient Gender: F                     HR:           91 bpm. Exam Location:  [facility] Procedure: 2D Echo, Cardiac Doppler, Color Doppler and Strain Analysis Indications:     Chest pain R07.9  History:         Patient has prior history of Echocardiogram examinations, most                  recent 04/17/2023. Previous Myocardial Infarction;                  Signs/Symptoms:Murmur.  Sonographer:     Cristela Blue Referring Phys:  [id number] Ocie Bob [facility] Diagnosing Phys: Julien Nordmann MD  Sonographer Comments: Global longitudinal strain was attempted. IMPRESSIONS  1.  Left ventricular ejection fraction, by estimation, is 30 to 35%. Left ventricular ejection fraction by 3D volume is 31 %. Left ventricular ejection fraction by PLAX is 34 %. The left ventricle has moderately decreased function. The left ventricle demonstrates global hypokinesis. Left ventricular diastolic parameters are consistent with Grade I diastolic dysfunction (impaired relaxation).  2. Right ventricular systolic function is mildly reduced. The right ventricular size is normal.  3. The mitral valve is normal in structure. Mild mitral valve regurgitation. No evidence of mitral stenosis.  4. The aortic valve has an indeterminant number of cusps. Aortic valve regurgitation is moderate. No aortic stenosis is present.  5. The inferior vena cava is normal in size with greater than 50% respiratory variability, suggesting right atrial pressure of 3 mmHg. FINDINGS  Left Ventricle: Left ventricular ejection fraction, by estimation, is 30 to 35%. Left ventricular ejection fraction by PLAX is 34 %. Left ventricular ejection fraction by 3D volume is 31 %. The left ventricle has moderately decreased function. The left ventricle demonstrates global hypokinesis. The left ventricular internal cavity size was normal in size. There is no left ventricular hypertrophy. Left ventricular diastolic parameters  are consistent with Grade I diastolic dysfunction (impaired relaxation). Right Ventricle: The right ventricular size is normal. No increase in right ventricular wall thickness. Right ventricular systolic function is mildly reduced. Left Atrium: Left atrial size was normal in size. Right Atrium: Right atrial size was normal in size. Pericardium: There is no evidence of pericardial effusion. Mitral Valve: The mitral valve is normal in structure. Mild mitral valve regurgitation. No evidence of mitral valve stenosis. Tricuspid Valve: The tricuspid valve is normal in structure. Tricuspid valve regurgitation is not demonstrated. No evidence of tricuspid stenosis. Aortic Valve: The aortic valve has an indeterminant number of cusps. Aortic valve regurgitation is moderate. No aortic stenosis is present. Aortic valve mean gradient measures 3.0 mmHg. Aortic valve peak gradient measures 5.7 mmHg. Aortic valve area, by VTI measures 3.35 cm. Pulmonic Valve: The pulmonic valve was normal in structure. Pulmonic valve regurgitation is not visualized. No evidence of pulmonic stenosis. Aorta: The aortic root is normal in size and structure. Venous: The inferior vena cava is normal in size with greater than 50% respiratory variability, suggesting right atrial pressure of 3 mmHg. IAS/Shunts: No atrial level shunt detected by color flow Doppler.  LEFT VENTRICLE PLAX 2D LV EF:         Left            Diastology                ventricular     LV e' medial:    4.35 cm/s                ejection        LV E/e' medial:  13.6                fraction by     LV e' lateral:   5.98 cm/s                PLAX is 34      LV E/e' lateral: 9.9                %. LVIDd:         4.40 cm         2D LVIDs:         3.70 cm         Longitudinal LV PW:  1.00 cm         Strain LV IVS:        0.70 cm         2D Strain GLS  -9.3 % LVOT diam:     2.00 cm         Avg: LV SV:         54 LV SV Index:   35              3D Volume EF LVOT Area:     3.14 cm         LV 3D EF:    Left                                             ventricul                                             ar LV Volumes (MOD)                            ejection LV vol d, MOD    84.8 ml                    fraction A2C:                                        by 3D LV vol d, MOD    79.1 ml                    volume is A4C:                                        31 %. LV vol s, MOD    45.2 ml A2C: LV vol s, MOD    55.4 ml       3D Volume EF: A4C:                           3D EF:        31 % LV SV MOD A2C:   39.6 ml LV SV MOD A4C:   79.1 ml LV SV MOD BP:    28.8 ml RIGHT VENTRICLE RV Basal diam:  3.80 cm RV Mid diam:    3.50 cm RV S prime:     15.00 cm/s TAPSE (M-mode): 2.6 cm LEFT ATRIUM           Index        RIGHT ATRIUM           Index LA diam:      2.10 cm 1.36 cm/m   RA Area:     12.20 cm LA Vol (A2C): 39.9 ml 25.83 ml/m  RA Volume:   31.00 ml  20.07 ml/m LA Vol (A4C): 9.8 ml  6.32 ml/m  AORTIC VALVE AV Area (Vmax):    2.67 cm AV Area (Vmean):   2.58 cm AV Area (VTI):     3.35 cm AV  Vmax:           119.00 cm/s AV Vmean:          77.100 cm/s AV VTI:            0.162 m AV Peak Grad:      5.7 mmHg AV Mean Grad:      3.0 mmHg LVOT Vmax:         101.00 cm/s LVOT Vmean:        63.300 cm/s LVOT VTI:          0.173 m LVOT/AV VTI ratio: 1.07  AORTA Ao Root diam: 3.20 cm MITRAL VALVE                TRICUSPID VALVE MV Area (PHT): 6.37 cm     TR Peak grad:   14.3 mmHg MV Decel Time: 119 msec     TR Vmax:        189.00 cm/s MV E velocity: 59.20 cm/s MV A velocity: 107.00 cm/s  SHUNTS MV E/A ratio:  0.55         Systemic VTI:  0.17 m                             Systemic Diam: 2.00 cm Julien Nordmann MD Electronically signed by Julien Nordmann MD Signature Date/Time: 04/22/2023/5:05:01 PM    Final    US Venous Img Lower Bilateral (DVT)  Result Date: 04/22/2023 CLINICAL DATA:  Edema of the lower extremities EXAM: BILATERAL LOWER EXTREMITY VENOUS DOPPLER ULTRASOUND TECHNIQUE: Gray-scale sonography with graded  compression, as well as color Doppler and duplex ultrasound were performed to evaluate the lower extremity deep venous systems from the level of the common femoral vein and including the common femoral, femoral, profunda femoral, popliteal and calf veins including the posterior tibial, peroneal and gastrocnemius veins when visible. The superficial great saphenous vein was also interrogated. Spectral Doppler was utilized to evaluate flow at rest and with distal augmentation maneuvers in the common femoral, femoral and popliteal veins. COMPARISON:  None Available. FINDINGS: RIGHT LOWER EXTREMITY Common Femoral Vein: No evidence of thrombus. Normal compressibility, respiratory phasicity and response to augmentation. Saphenofemoral Junction: No evidence of thrombus. Normal compressibility and flow on color Doppler imaging. Profunda Femoral Vein: No evidence of thrombus. Normal compressibility and flow on color Doppler imaging. Femoral Vein: No evidence of thrombus. Normal compressibility, respiratory phasicity and response to augmentation. Popliteal Vein: No evidence of thrombus. Normal compressibility, respiratory phasicity and response to augmentation. Calf Veins: No evidence of thrombus. Normal compressibility and flow on color Doppler imaging. Superficial Great Saphenous Vein: No evidence of thrombus. Normal compressibility. Venous Reflux:  None. Other Findings:  None. LEFT LOWER EXTREMITY Common Femoral Vein: No evidence of thrombus. Normal compressibility, respiratory phasicity and response to augmentation. Saphenofemoral Junction: No evidence of thrombus. Normal compressibility and flow on color Doppler imaging. Profunda Femoral Vein: No evidence of thrombus. Normal compressibility and flow on color Doppler imaging. Femoral Vein: No evidence of thrombus. Normal compressibility, respiratory phasicity and response to augmentation. Popliteal Vein: No evidence of thrombus. Normal compressibility, respiratory  phasicity and response to augmentation. Calf Veins: Occlusive thrombus identified within the peroneal vein as well as the gastrocnemius vein. Superficial Great Saphenous Vein: No evidence of thrombus. Normal compressibility. Venous Reflux:  None. Other Findings:  None. IMPRESSION: 1. Exam positive for left lower extremity DVT. Occlusive thrombus identified within the left peroneal vein as well as the left gastrocnemius vein.  2. No evidence of deep venous thrombosis in the right lower extremity. These results will be called to the ordering clinician or representative by the Radiologist Assistant, and communication documented in the PACS or [employer]. Electronically Signed   By: Signa Kell M.D.   On: 04/22/2023 05:10   DG Chest Port 1 View  Result Date: 04/21/2023 CLINICAL DATA:  Acute respiratory failure with hypoxia. Recent COVID infection. EXAM: PORTABLE CHEST 1 VIEW COMPARISON:  Chest x-ray dated April 18, 2023. FINDINGS: Stable cardiomediastinal silhouette with normal heart size. Asymmetric interstitial and airspace opacities in both lungs are similar to the prior study. No pleural effusion or pneumothorax. No acute osseous abnormality. IMPRESSION: 1. Unchanged multifocal pneumonia. Electronically Signed   By: Obie Dredge M.D.   On: 04/21/2023 10:18     ASSESSMENT AND PLAN: Moderate to severe LV dysfunction with diffuse hypokinesis and mild mitral regurgitation on echocardiogram done this morning.  This echo was read by Dr. Mariah Milling and I was asked to evaluate the patient.  Patient states she is mildly short of breath.  However blood gas this morning showed severe hypoxia.  On admission patient had troponin which were elevated to 2800 but there was no significant EKG changes.  It was felt that it was related to COVID myopericarditis.  It was felt to hold off further workup until COVID-pneumonia was treated.  Since there is moderate to severe LV dysfunction advised starting the patient on  Lasix, Entresto, Farxiga, digoxin and metoprolol succinate.  Will gradually start these medications since blood pressure has been labile.  Thank you very much for referral.  Adrian Blackwater

## 2023-04-22 NOTE — Progress Notes (Signed)
*  PRELIMINARY RESULTS* Echocardiogram 2D Echocardiogram has been performed.  Teresa Sherman 04/22/2023, 3:02 PM

## 2023-04-22 NOTE — TOC Progression Note (Signed)
Transition of Care [facility]) - Progression Note    Patient Details  Name: Teresa Sherman MRN: [id number] Date of Birth: 1946/04/24  Transition of Care [facility]) CM/SW Contact  Kreg Shropshire, RN Phone Number: 04/22/2023, 4:01 PM  Clinical Narrative:    Cm continue to follow for toc needs. Pt has bed offers at this time.        Expected Discharge Plan and Services                                               Social Determinants of Health (SDOH) Interventions SDOH Screenings   Food Insecurity: No Food Insecurity (04/13/2023)  Housing: Low Risk  (04/13/2023)  Transportation Needs: No Transportation Needs (04/13/2023)  Utilities: Not At Risk (04/13/2023)  Financial Resource Strain: Low Risk  (06/14/2021)   Received from [facility]  Physical Activity: Sufficiently Active (10/30/2021)   Received from [facility]  Social Connections: Socially Isolated (10/30/2021)   Received from [facility]  Stress: No Stress Concern Present (10/30/2021)   Received from [facility]  Tobacco Use: Low Risk  (04/11/2023)  Health Literacy: Medium Risk (10/30/2021)   Received from [facility]    Readmission Risk Interventions     No data to display
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
## 2023-04-22 NOTE — Progress Notes (Signed)
PHARMACY CONSULT NOTE - ELECTROLYTES  Pharmacy Consult for Electrolyte Monitoring and Replacement   Recent Labs: Height: 5' (152.4 cm) Weight: 56.6 kg (124 lb 12.5 oz) IBW/kg (Calculated) : 45.5 Estimated Creatinine Clearance: 47.9 mL/min (by C-G formula based on SCr of 0.52 mg/dL). Potassium (mmol/L)  Date Value  04/22/2023 3.5  11/26/2013 3.8   Magnesium (mg/dL)  Date Value  16/05/9603 2.1   Calcium (mg/dL)  Date Value  54/04/8118 8.4 (L)   Calcium, Total (mg/dL)  Date Value  14/78/2956 9.5   Albumin (g/dL)  Date Value  21/30/8657 2.4 (L)  04/30/2015 4.3  11/26/2013 3.9   Phosphorus (mg/dL)  Date Value  84/69/6295 3.1   Sodium (mmol/L)  Date Value  04/22/2023 140  04/30/2015 140  11/26/2013 142   Assessment  [name] is a 77 y.o. female presenting with Severe sepsis secondary to pneumonia . PMH significant for CVA, Anemia, and depression. Pharmacy has been consulted to monitor and replace electrolytes.  Goal of Therapy: Electrolytes WNL  Plan:  No electrolyte replacement indicated at this time No labs ordered for tomorrow AM at this time  Thank you for involving pharmacy in this patient's care.   Teresa Sherman 04/22/2023 7:49 AM

## 2023-04-22 NOTE — Progress Notes (Signed)
DVT US BLE 04/22/23: Positive for LLE DVT within the left peroneal and gastrocnemius vein. - restart heparin drip - consider vascular consultation   Teresa Sherman [place], AGACNP-BC Acute Care Nurse Practitioner [facility]   [phone] / [phone] Please see Amion for details.

## 2023-04-22 NOTE — Progress Notes (Addendum)
Progress Note   Patient: Teresa Sherman DOB: [birth year] DOA: 04/11/2023     11 DOS: the patient was seen and examined on 04/22/2023   Brief Sherman course:  Teresa Sherman is a 77 y.o. female with medical history significant for Prior stroke, depression with anxiety, prior ESBL E. coli, who presents to the emergency room by EMS with altered mental status.  She was found after wellness check was requested after she had not been seen in 2 days.  She was found somnolent and confused in her bed and had unintelligible mumbling but without focal neurologic deficits.  She is reportedly oriented x 3 at baseline.  She was also noted by EMS to have foul-smelling urine ED course and data review: Normotensive tachycardic to 115, tachypneic to 23 with O2 sat 90 to 95% on room air, BP 101/83 improving to 126/87 with IV fluids Labs with normal WBC of 8.1 but lactic acid 2.4 and procalcitonin 7.73.  Hemoglobin noted to be 7.8 with baseline around 13 a year ago.  CMP notable for creatinine of 1.78, up from 0.51 a year ago, AST/ALT 45/23.  COVID-positive.  CK14 38.G, EKG personally viewed and interpreted showing sinus tachycardia at 111 with no acute ST-T wave changes. Chest x-ray with possible right lower lobe airspace opacity as further detailed below: IMPRESSION: Right lower lung zone airspace opacity. Followup PA and lateral chest X-ray is recommended in 3-4 weeks following trial of antibiotic therapy to ensure resolution and exclude underlying malignancy.   Patient started on sepsis fluids and given Rocephin for suspected UTI Hospitalist consulted for admission.    As per Teresa. Mayford Sherman 8/21-8/27/24: Pt was found to have severe sepsis secondary to COVID19 pneumonia & possible co-bacterial infection. Completed IV rocephin, azithromycin x 5 days but then restarted on unasyn which was then changed to IV zosyn as pt has been moved into stepdown x 2 overnight this admission so far. Pt  was placed on BiPAP overnight (last night) and is currently on HFNC. Discussed code status w/ pt and she wants to continue full code.      04/21/23 : Patient seen and examined at the bedside.  Continues to have an increased oxygen requirement and is currently on HFNC  55L/90%.  She appears very weak. Called patient's daughter, Teresa Sherman who wants her mother transferred to [facility] [place], [place]. Teresa Sherman and discussed patient with intensivist who states that he has nothing different to offer her at this time and for [place] to call back if something changes in her condition.  Informed patient's daughter who is upset and is insistent that her mother be transferred because she is not satisfied with the care her mother is receiving here.  I have explained to her in detail that the patient can only be transferred due to medical necessity and not family request.  She requested I speak to her mother's primary care provider, Teresa Sherman ([phone]).  Called but not working today.      Assessment and Plan:   Severe sepsis with acute hypoxic respiratory failure. met criteria w/ tachycardia and tachypnea with soft blood pressure, AKI, AMS, lactic acidosis with elevated procalcitonin  Possibly secondary to COVID19 pneumonia with superimposed bacterial co-infection vs UTI.  Patient had an elevated Pro calcitonin levels > 4 Severe sepsis resolved and patient has completed antibiotic therapy. Rocephin + Zithromax/Unasyn/Zosyn Developed increased oxygen requirements on 08/23 and has been on increasing levels of HFNC as well as Bipap Her oxygen requirement has decreased  from HFNC, 90% FiO2 to 65% FiO2/50L over the last 24 hours  Continue oxygen supplementation to maintain pulse oximetry greater than 92%      Acute metabolic encephalopathy Delirium Probably related to COVID-19 infection as well as prolonged hospitalization with ICU stay Patient started on low-dose Seroquel Mental status continues to wax  and wane.  She is only oriented to person today during rounds     COVID19 pneumonia: w/ vs. possible co-bacterial infection.  Pro-cal was elevated at 4.12.  Continue on bronchodilators & encourage incentive spirometry.  CTA chest neg for PE but showed multifocal pneumonia Patient has completed a course of Zosyn, Rocephin/Zithromax Continue systemic steroids but decrease to 40 mg IV daily      NSTEMI:  Etiology unclear, possibly secondary to COVID19.  Troponin >3,000 but trending down.  Continue on statin, aspirin.  D/c IV heparin drip.  BP & HR will not tolerate GDMT as per cardio.  Echo shows LVEF is severely depressed The proximal 1/4 of LV thickenes but the  rest of ventricle is severely hypokinetic / akinetiic Appearance of Takotsubo's cardiomyopathy. The left ventricle has severely decreased  function. Akinesis/dyskineiss of distal 2/3 of RV with aneurysmal dilitation in mid region. Cardio signed off 04/17/23 and will see as an outpatient.    Left lower extremity DVT Lower extremity ultrasound shows occlusive thrombus identified within the left peroneal vein as well as the left gastrocnemius vein. No evidence of deep venous thrombosis in the right lower extremity. Continue heparin drip      R/o Dysphagia:  started back on regular diet 04/15/23 as per speech        Urinary retention: UTI:  Bladder scan showed 800 mL, Foley catheter inserted on 8/20. Will do a voiding trial prior to d/c Hx pseudomonas and ESBL E. coli UTI 11/2021. Urine cx shows no growth. Completed abx course      AKI :  Resolved     Rhabdomyolysis:  Unknown whether traumatic or not. CK WNL. Resolved.       Normocytic anemia:  H&H is stable No need for a transfusion currently     Hx of CVA:  continue on aspirin, statin     Depression severity unknown. Continue on home dose of trazodone    Constipation:  Discontinue Colace and MiraLAX due to loose stools Hold Aricept which can also  contribute to diarrhea     Unstageable Pressure Injury; coccyx Appreciate wound care input Pressure Injury POA: Yes Measurement: 5cm x 6cm x 0cm  Wound bed:100% black/grey soft eschar  Drainage (amount, consistency, odor) scant, no odor Periwound: intact  Dressing procedure/placement/frequency: 1. Cleanse sacral wound with saline, pat dry 2. Apply 1/4" thick layer of Santyl to the wound bed, top with saline moist gauze dressing.  3. Top with dry dressing, change daily Add low air loss mattress for moisture management and pressure redistribution Add RD consultation for wound supplementation        Subjective: Patient is seen and examined at the bedside.  She is oriented only to person.  Not to place or time  Physical Exam: Vitals:   04/22/23 1130 04/22/23 1200 04/22/23 1230 04/22/23 1300  BP: 117/81 119/77 113/82 104/64  Pulse: 89  (!) 103 91  Resp: 19 19 (!) 26 (!) 33  Temp:      TempSrc:      SpO2: 96%  93% 91%  Weight:      Height:       General exam: appears calm  but uncomfortable, very weak.  Oriented only to person not to place or time Respiratory system: course breath sounds b/l  Cardiovascular system: S1 & S2+. No rubs or clicks  Gastrointestinal system: abd is soft, NT, ND &  bowel sounds ++ Central nervous system: alert and oriented. Moves all extremities  Psychiatry: judgement and insight appears improved. Flat mood and affect       Data Reviewed: Labs reviewed.  CRP 2.7, white count 12.3, hemoglobin 10.1 There are no new results to review at this time.  Family Communication: Called and spoke to patient's daughter Teresa Sherman over the phone.  Update given on her mother's current condition and plan of care.  All questions and concerns addressed.  She verbalizes understanding and agrees with the plan.  Disposition: Status is: Inpatient Remains inpatient appropriate because: Continues to have an increased oxygen requirement  Planned Discharge  Destination:  TBD    Time spent: 38 minutes  Author: Lucile Shutters, MD 04/22/2023 2:02 PM  For on call review [email or web address].

## 2023-04-23 ENCOUNTER — Inpatient Hospital Stay: Payer: Medicare HMO

## 2023-04-23 DIAGNOSIS — I214 Non-ST elevation (NSTEMI) myocardial infarction: Secondary | ICD-10-CM | POA: Diagnosis not present

## 2023-04-23 DIAGNOSIS — A419 Sepsis, unspecified organism: Secondary | ICD-10-CM | POA: Diagnosis not present

## 2023-04-23 DIAGNOSIS — I509 Heart failure, unspecified: Secondary | ICD-10-CM | POA: Diagnosis not present

## 2023-04-23 DIAGNOSIS — R6520 Severe sepsis without septic shock: Secondary | ICD-10-CM | POA: Diagnosis not present

## 2023-04-23 DIAGNOSIS — I429 Cardiomyopathy, unspecified: Secondary | ICD-10-CM | POA: Diagnosis not present

## 2023-04-23 LAB — CBC
HCT: 28.7 % — ABNORMAL LOW (ref 36.0–46.0)
Hemoglobin: 9.8 g/dL — ABNORMAL LOW (ref 12.0–15.0)
MCH: 30.9 pg (ref 26.0–34.0)
MCHC: 34.1 g/dL (ref 30.0–36.0)
MCV: 90.5 fL (ref 80.0–100.0)
Platelets: 296 10*3/uL (ref 150–400)
RBC: 3.17 MIL/uL — ABNORMAL LOW (ref 3.87–5.11)
RDW: 14.2 % (ref 11.5–15.5)
WBC: 12.9 10*3/uL — ABNORMAL HIGH (ref 4.0–10.5)
nRBC: 0 % (ref 0.0–0.2)

## 2023-04-23 LAB — RENAL FUNCTION PANEL
Albumin: 2.4 g/dL — ABNORMAL LOW (ref 3.5–5.0)
Anion gap: 10 (ref 5–15)
BUN: 20 mg/dL (ref 8–23)
CO2: 26 mmol/L (ref 22–32)
Calcium: 7.8 mg/dL — ABNORMAL LOW (ref 8.9–10.3)
Chloride: 102 mmol/L (ref 98–111)
Creatinine, Ser: 0.51 mg/dL (ref 0.44–1.00)
GFR, Estimated: >60 mL/min (ref >60)
Glucose, Bld: 98 mg/dL (ref 70–99)
Phosphorus: 2.5 mg/dL (ref 2.5–4.6)
Potassium: 2.8 mmol/L — ABNORMAL LOW (ref 3.5–5.1)
Sodium: 138 mmol/L (ref 135–145)

## 2023-04-23 LAB — HEPARIN LEVEL (UNFRACTIONATED): Heparin Unfractionated: 0.65 [IU]/mL (ref 0.30–0.70)

## 2023-04-23 LAB — FERRITIN: Ferritin: 96 ng/mL (ref 11–307)

## 2023-04-23 LAB — MAGNESIUM: Magnesium: 2.2 mg/dL (ref 1.7–2.4)

## 2023-04-23 LAB — C-REACTIVE PROTEIN: CRP: 3.4 mg/dL — ABNORMAL HIGH (ref <1.0)

## 2023-04-23 LAB — PROCALCITONIN: Procalcitonin: 0.14 ng/mL

## 2023-04-23 MED ORDER — POTASSIUM CHLORIDE CRYS ER 20 MEQ PO TBCR
40.0000 meq | EXTENDED_RELEASE_TABLET | ORAL | Status: AC
  Administered 2023-04-23 (×2): 40 meq via ORAL
  Filled 2023-04-23 (×2): qty 2

## 2023-04-23 MED ORDER — POTASSIUM CHLORIDE 10 MEQ/100ML IV SOLN
10.0000 meq | INTRAVENOUS | Status: AC
  Administered 2023-04-23 (×2): 10 meq via INTRAVENOUS
  Filled 2023-04-23 (×2): qty 100

## 2023-04-23 MED ORDER — APIXABAN 5 MG PO TABS
10.0000 mg | ORAL_TABLET | Freq: Two times a day (BID) | ORAL | Status: DC
  Administered 2023-04-23 – 2023-04-27 (×8): 10 mg via ORAL
  Filled 2023-04-23 (×9): qty 2

## 2023-04-23 MED ORDER — SPIRONOLACTONE 12.5 MG HALF TABLET
12.5000 mg | ORAL_TABLET | Freq: Every day | ORAL | Status: DC
  Administered 2023-04-23 – 2023-04-24 (×2): 12.5 mg via ORAL
  Filled 2023-04-23 (×2): qty 1

## 2023-04-23 MED ORDER — APIXABAN 5 MG PO TABS: 5.0000 mg | ORAL_TABLET | Freq: Two times a day (BID) | ORAL | Status: DC

## 2023-04-23 MED ORDER — METOPROLOL SUCCINATE ER 25 MG PO TB24
12.5000 mg | ORAL_TABLET | Freq: Every day | ORAL | Status: DC
  Administered 2023-04-23 – 2023-04-24 (×2): 12.5 mg via ORAL
  Filled 2023-04-23 (×2): qty 0.5

## 2023-04-23 NOTE — Progress Notes (Signed)
Attempted BIPAP earlier, patient pulled the circuit off causing drop in saturation in the 70's, placed patient back on HFNC with increased settings. Will wean as tolerated

## 2023-04-23 NOTE — Progress Notes (Signed)
ANTICOAGULATION CONSULT NOTE  Pharmacy Consult for Heparin Infusion Indication: DVT  Allergies  Allergen Reactions   Duloxetine Hcl Nausea And Vomiting   Monosodium Glutamate     migraine   Nitrates, Organic     migraine   Sodium Benzoate [Nutritional Supplements]     migraine    Patient Measurements: Height: 5' (152.4 cm) Weight: 58.1 kg (128 lb 1.4 oz) IBW/kg (Calculated) : 45.5 Heparin Dosing Weight: 56.6 kg   Vital Signs: Temp: 98.1 F (36.7 C) (08/31 0100) Temp Source: Oral (08/31 0100) BP: 107/71 (08/31 0300) Pulse Rate: 80 (08/31 0300)  Labs: Recent Labs    04/20/23 0400 04/21/23 0342 04/21/23 0904 04/22/23 0439 04/22/23 1630 04/23/23 0225  HGB 10.1*  --  10.0* 10.1*  --  9.8*  HCT 29.5*  --  30.1* 30.4*  --  28.7*  PLT 246  --  252 285  --  296  HEPARINUNFRC  --   --   --   --  0.71* 0.65  CREATININE 0.63 0.60  --  0.52  --   --     Estimated Creatinine Clearance: 48.4 mL/min (by C-G formula based on SCr of 0.52 mg/dL).   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   unk   aspirin 81 MG chewable tablet Chew 81 mg by mouth daily.   04/10/2023   aspirin-acetaminophen-caffeine (EXCEDRIN MIGRAINE) 250-250-65 MG tablet Take by mouth every 6 (six) hours as needed for headache.   unk   atorvastatin (LIPITOR) 40 MG tablet Take 40 mg by mouth daily.   Past Week   Azelastine HCl 137 MCG/SPRAY SOLN Place 1 spray into both nostrils at bedtime.   unk   Biotin 01027 MCG TBDP Take 1 tablet by mouth every morning.   Past Week   donepezil (ARICEPT) 10 MG tablet Take 10 mg by mouth  at bedtime.   Past Week   guaiFENesin (ROBITUSSIN) 100 MG/5ML liquid Take 5 mLs by mouth every 4 (four) hours as needed for cough or to loosen phlegm. 120 mL 0 unk   melatonin 1 MG TABS tablet Take 0.5 mg by mouth at bedtime.   Past Week   traZODone (DESYREL) 50 MG tablet Take 25 mg by mouth at bedtime as needed for sleep.   Past Week    Assessment: Teresa Sherman is a 77 y.o. female presenting with COVID. PMH significant for CVA, depression, anxiety. Patient was not on [facility] PTA per chart review. Patient received lovenox 40 mg SQ on 8/29 @ 2120. Pharmacy has been consulted to initiate and manage heparin infusion.   Baseline Labs: Hgb 10.1, Hct 30.4, Plt 285   Goal of Therapy:  Heparin level 0.3-0.7 units/ml Monitor platelets by anticoagulation protocol: Yes  Date Time HL Rate/Comment  8/30 1630 0.71 950/supratherapeutic 8/31 0225 0.65 Therapeutic x 1  Plan:  Continue heparin infusion at 900 units/hr Recheck HL at 1400 to confirm, then daily Continue to monitor H&H and platelets daily while on heparin infusion   Otelia Sergeant, PharmD, [facility] 04/23/2023 3:20 AM

## 2023-04-23 NOTE — Progress Notes (Signed)
Progress Note   Patient: Teresa Sherman VHQ:[id number] DOB: [birth year] DOA: 04/11/2023     12 DOS: the patient was seen and examined on 04/23/2023   Brief hospital course: Teresa Sherman is a 77 y.o. female with medical history significant for Prior stroke, depression with anxiety, prior ESBL E. coli, who presents to the emergency room by EMS with altered mental status.  She was found after wellness check was requested after she had not been seen in 2 days.  She was found somnolent and confused in her bed and had unintelligible mumbling but without focal neurologic deficits.  She is reportedly oriented x 3 at baseline.  She was also noted by EMS to have foul-smelling urine ED course and data review: Normotensive tachycardic to 115, tachypneic to 23 with O2 sat 90 to 95% on room air, BP 101/83 improving to 126/87 with IV fluids Labs with normal WBC of 8.1 but lactic acid 2.4 and procalcitonin 7.73.  Hemoglobin noted to be 7.8 with baseline around 13 a year ago.  CMP notable for creatinine of 1.78, up from 0.51 a year ago, AST/ALT 45/23.  COVID-positive.  CK14 38.G, EKG personally viewed and interpreted showing sinus tachycardia at 111 with no acute ST-T wave changes. Chest x-ray with possible right lower lobe airspace opacity as further detailed below: IMPRESSION: Right lower lung zone airspace opacity. Followup PA and lateral chest X-ray is recommended in 3-4 weeks following trial of antibiotic therapy to ensure resolution and exclude underlying malignancy.   Patient started on sepsis fluids and given Rocephin for suspected UTI Hospitalist consulted for admission.    As per Dr. Mayford Knife 8/21-8/27/24: Pt was found to have severe sepsis secondary to COVID19 pneumonia & possible co-bacterial infection. Completed IV rocephin, azithromycin x 5 days but then restarted on unasyn which was then changed to IV zosyn as pt has been moved into stepdown x 2 overnight this admission so far. Pt was  placed on BiPAP overnight (last night) and is currently on HFNC. Discussed code status w/ pt and she wants to continue full code.      04/21/23 : Patient seen and examined at the bedside.  Continues to have an increased oxygen requirement and is currently on HFNC  55L/90%.  She appears very weak. Called patient's daughter, [name] who wants her mother transferred to [facility] [place]. [facility] and discussed patient with intensivist who states that he has nothing different to offer her at this time and for [place] to call back if something changes in her condition.  Informed patient's daughter who is upset and is insistent that her mother be transferred because she is not satisfied with the care her mother is receiving here.  I have explained to her in detail that the patient can only be transferred due to medical necessity and not family request.  She requested I speak to her mother's primary care provider, Dr [name] ([phone]).  Called but not working today.  04/22/23 : Received call from [facility] about family request for patient transfer.  Discussed patient's condition and plan of care with intensivist over the phone.  Please see notes  08/33/24: Patient is seen and examined at the bedside.  She is more awake and alert.  Remains on high flow nasal cannula at 70% 55 L           Assessment and Plan:  Severe sepsis with acute hypoxic respiratory failure. met criteria w/ tachycardia and tachypnea with soft blood pressure, AKI, AMS, lactic  acidosis with elevated procalcitonin  Possibly secondary to COVID19 pneumonia with superimposed bacterial co-infection vs UTI.  Patient had an elevated Pro calcitonin levels > 4 Severe sepsis resolved and patient has completed antibiotic therapy. Rocephin + Zithromax/Unasyn/Zosyn Developed increased oxygen requirements on 08/23 and has been on increasing levels of HFNC as well as Bipap Her oxygen requirement has decreased from HFNC, 90% FiO2 to 70%  FiO2/50L over the last 24 hours  Continue oxygen supplementation to maintain pulse oximetry greater than 92%       Acute metabolic encephalopathy Delirium History of mild cognitive deficit secondary to prior CVA on Aricept Probably related to COVID-19 infection as well as prolonged hospitalization with ICU stay Patient started on low-dose Seroquel Mental status continues to wax and wane.  Improved mental status and oriented to person, place and time       COVID19 pneumonia: w/ vs. possible co-bacterial infection.  Pro-cal was elevated at 4.12.  Continue on bronchodilators & encourage incentive spirometry.  CTA chest neg for PE but showed multifocal pneumonia Patient has completed a course of Zosyn, Rocephin/Zithromax Continue systemic steroids but decrease to 40 mg IV daily      NSTEMI:  Etiology unclear, possibly secondary to COVID19.  Troponin >3,000 but trending down.  Continue on statin, aspirin.  D/c IV heparin drip.  BP & HR will not tolerate GDMT as per cardio.  Echo shows LVEF is severely depressed The proximal 1/4 of LV thickenes but the  rest of ventricle is severely hypokinetic / akinetiic Appearance of Takotsubo's cardiomyopathy. The left ventricle has severely decreased  function. Akinesis/dyskineiss of distal 2/3 of RV with aneurysmal dilitation in mid region. Cardio signed off 04/17/23 and will see as an outpatient.       Left lower extremity DVT Lower extremity ultrasound shows occlusive thrombus identified within the left peroneal vein as well as the left gastrocnemius vein. No evidence of deep venous thrombosis in the right lower extremity. Will transition patient to Eliquis 10 mg p.o. twice daily for 7 days and 5 mg twice daily    Acute systolic CHF CXR showed asymmetric interstitial and airspace opacities in both lungs, similar to the prior study. 2D echocardiogram shows an LVEF of 30 to 35%.  Left ventricle demonstrates global hypokinesis.  LV diastolic  parameters are consistent with grade 1 diastolic dysfunction. BNP is elevated at 875 Appreciate cardiology input, patient has been started on goal-directed medical therapy as much as blood pressure tolerates and is currently on Farxiga, digoxin, Entresto, spironolactone and metoprolol Monitor blood pressure closely         R/o Dysphagia:  started back on regular diet 04/15/23 as per speech         Urinary retention: UTI:  Bladder scan showed 800 mL, Foley catheter inserted on 8/20. Will do a voiding trial prior to d/c Hx pseudomonas and ESBL E. coli UTI 11/2021. Urine cx shows no growth. Completed abx course        AKI :  Resolved     Rhabdomyolysis:  Unknown whether traumatic or not. CK WNL. Resolved.        Normocytic anemia:  H&H is stable No need for a transfusion currently      Hx of CVA:  continue on aspirin, statin      Depression severity unknown. Continue on home dose of trazodone    Constipation:  Discontinue Colace and MiraLAX due to loose stools Hold Aricept which can also contribute to diarrhea  Unstageable Pressure Injury; coccyx Appreciate wound care input Pressure Injury POA: Yes Measurement: 5cm x 6cm x 0cm  Wound bed:100% black/grey soft eschar  Drainage (amount, consistency, odor) scant, no odor Periwound: intact  Dressing procedure/placement/frequency: 1. Cleanse sacral wound with saline, pat dry 2. Apply 1/4" thick layer of Santyl to the wound bed, top with saline moist gauze dressing.  3. Top with dry dressing, change daily Add low air loss mattress for moisture management and pressure redistribution Add RD consultation for wound supplementation            Subjective: Patient is seen and examined at the bedside.  More awake, alert and oriented to person place and time.  Physical Exam: Vitals:   04/23/23 1200 04/23/23 1300 04/23/23 1353 04/23/23 1400  BP: 91/76 116/87  120/80  Pulse: 99 98 85 93  Resp: (!) 25 20 (!) 21  (!) 24  Temp:      TempSrc:      SpO2: 95% 92% 93% 90%  Weight:      Height:       General: Acute on chronically ill-appearing frail elderly female, sitting in bed, on heated high flow nasal cannula, in no acute distress HENT: Atraumatic, normocephalic, neck supple, no JVD Lungs: Coarse breath sounds throughout, even, nonlabored, normal effort Cardiovascular: Regular rate and rhythm, S1-S2, no murmurs, rubs, gallops Abdomen: Soft, nontender, nondistended, no guarding or rebound tenderness, bowel sounds positive x 4 Extremities: Generalized weakness, no deformities, no edema Neuro: Awake and alert, oriented only to self, moves all extremities to commands, no focal deficits, pupils PERRLA GU: Foley catheter in place draining yellow urine   Data Reviewed: Labs reviewed.  Potassium 2.8, procalcitonin 0.14, white count 12.9 There are no new results to review at this time.  Family Communication: Called and discussed patient's condition and plan of care with her daughter [name] over the phone.  All questions and concerns have been addressed.  Disposition: Status is: Inpatient Remains inpatient appropriate because: Continues to have an increased oxygen requirement  Planned Discharge Destination:  TBD    Time spent: 40 minutes  Author: Lucile Shutters, MD 04/23/2023 3:45 PM  For on call review [email or web address].
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
## 2023-04-23 NOTE — Plan of Care (Signed)
  Problem: Fluid Volume: Goal: Hemodynamic stability will improve Outcome: Progressing   Problem: Clinical Measurements: Goal: Diagnostic test results will improve Outcome: Progressing Goal: Signs and symptoms of infection will decrease Outcome: Not Progressing   Problem: Respiratory: Goal: Ability to maintain adequate ventilation will improve Outcome: Progressing   Problem: Education: Goal: Knowledge of risk factors and measures for prevention of condition will improve Outcome: Progressing   Problem: Coping: Goal: Psychosocial and spiritual needs will be supported Outcome: Progressing   Problem: Respiratory: Goal: Will maintain a patent airway Outcome: Progressing Goal: Complications related to the disease process, condition or treatment will be avoided or minimized Outcome: Progressing

## 2023-04-23 NOTE — Progress Notes (Signed)
PHARMACY CONSULT NOTE - ELECTROLYTES  Pharmacy Consult for Electrolyte Monitoring and Replacement   Recent Labs: Height: 5' (152.4 cm) Weight: 54.1 kg (119 lb 4.3 oz) IBW/kg (Calculated) : 45.5 Estimated Creatinine Clearance: 43.6 mL/min (by C-G formula based on SCr of 0.51 mg/dL). Potassium (mmol/L)  Date Value  04/23/2023 2.8 (L)  11/26/2013 3.8   Magnesium (mg/dL)  Date Value  16/05/9603 2.1   Calcium (mg/dL)  Date Value  54/04/8118 7.8 (L)   Calcium, Total (mg/dL)  Date Value  14/78/2956 9.5   Albumin (g/dL)  Date Value  21/30/8657 2.4 (L)  04/30/2015 4.3  11/26/2013 3.9   Phosphorus (mg/dL)  Date Value  84/69/6295 2.5   Sodium (mmol/L)  Date Value  04/23/2023 138  04/30/2015 140  11/26/2013 142   Assessment  Teresa Sherman is a 77 y.o. female presenting with Severe sepsis secondary to pneumonia . PMH significant for CVA, Anemia, and depression. Pharmacy has been consulted to monitor and replace electrolytes.  Goal of Therapy: Electrolytes WNL  Plan:  KCL IV x 2 doses + PO x 2 doses Will recheck electrolytes tomorrow with AM labs  Thank you for involving pharmacy in this patient's care.   Harrell Lark A Yulianna Folse 04/23/2023 8:13 AM

## 2023-04-23 NOTE — Progress Notes (Signed)
SUBJECTIVE: Patient says that she is feeling much better today and denies any shortness of breath or chest pain   Vitals:   04/23/23 0813 04/23/23 0816 04/23/23 0835 04/23/23 0900  BP:    104/66  Pulse: 92 85 (!) 105 100  Resp: 19 16 (!) 24 16  Temp:      TempSrc:      SpO2: (!) 85% 94% 90% 90%  Weight:      Height:        Intake/Output Summary (Last 24 hours) at 04/23/2023 1147 Last data filed at 04/23/2023 0803 Gross per 24 hour  Intake 535.58 ml  Output 2370 ml  Net -1834.42 ml    LABS: Basic Metabolic Panel: Recent Labs    04/21/23 0904 04/22/23 0439 04/23/23 0600  NA  --  140 138  K  --  3.5 2.8*  CL  --  97* 102  CO2  --  27 26  GLUCOSE  --  142* 98  BUN  --  27* 20  CREATININE  --  0.52 0.51  CALCIUM  --  8.4* 7.8*  MG 1.9 2.1  --   PHOS  --  3.1 2.5   Liver Function Tests: Recent Labs    04/22/23 0439 04/23/23 0600  ALBUMIN 2.4* 2.4*   No results for input(s): "LIPASE", "AMYLASE" in the last 72 hours. CBC: Recent Labs    04/22/23 0439 04/23/23 0225  WBC 12.3* 12.9*  HGB 10.1* 9.8*  HCT 30.4* 28.7*  MCV 91.3 90.5  PLT 285 296   Cardiac Enzymes: No results for input(s): "CKTOTAL", "CKMB", "CKMBINDEX", "TROPONINI" in the last 72 hours. BNP: Invalid input(s): "POCBNP" D-Dimer: Recent Labs    04/21/23 0951  DDIMER 19.70*   Hemoglobin A1C: No results for input(s): "HGBA1C" in the last 72 hours. Fasting Lipid Panel: No results for input(s): "CHOL", "HDL", "LDLCALC", "TRIG", "CHOLHDL", "LDLDIRECT" in the last 72 hours. Thyroid Function Tests: No results for input(s): "TSH", "T4TOTAL", "T3FREE", "THYROIDAB" in the last 72 hours.  Invalid input(s): "FREET3" Anemia Panel: Recent Labs    04/23/23 0225  FERRITIN 96     PHYSICAL EXAM General: Well developed, well nourished, in no acute distress HEENT:  Normocephalic and atramatic Neck:  No JVD.  Lungs: Clear bilaterally to auscultation and percussion. Heart: HRRR . Normal S1 and S2  without gallops or murmurs.  Abdomen: Bowel sounds are positive, abdomen soft and non-tender  Msk:  Back normal, normal gait. Normal strength and tone for age. Extremities: No clubbing, cyanosis or edema.   Neuro: Alert and oriented X 3. Psych:  Good affect, responds appropriately  TELEMETRY: Normal sinus rhythm about 90 bpm  ASSESSMENT AND PLAN: HFrEF with severe LV dysfunction left ventricular ejection fraction 30 to 35% with diffuse hypokinesis and moderate aortic and mild mitral regurgitation on recent echocardiogram.  Patient was not on any guideline directed medical therapy thus initiated yesterday.  She is feeling much better after starting [place] Custard and digoxin.  Potassium was 2.8 advised aggressively bring the potassium to 3.8-4 as patient is on digoxin.  Will add metoprolol 25 mg succinate and Aldactone today.   ICD-10-CM   1. Sepsis with acute hypoxic respiratory failure without septic shock, due to unspecified organism (HCC)  A41.9    R65.20    J96.01     2. COVID-19 virus infection  U07.1     3. Non-traumatic rhabdomyolysis  M62.82       Principal Problem:   Severe sepsis (HCC) Active Problems:  Depression with anxiety   History of stroke   CAP (community acquired pneumonia)   Pneumonia due to COVID-19 virus   Rhabdomyolysis   AKI (acute kidney injury) (HCC)   Acute metabolic encephalopathy   Urinary tract infection   Anemia   Acute on chronic respiratory failure with hypoxia [facility])    Adrian Blackwater, MD, [facility] 04/23/2023 11:47 AM

## 2023-04-24 DIAGNOSIS — I214 Non-ST elevation (NSTEMI) myocardial infarction: Secondary | ICD-10-CM | POA: Diagnosis not present

## 2023-04-24 DIAGNOSIS — I429 Cardiomyopathy, unspecified: Secondary | ICD-10-CM | POA: Diagnosis not present

## 2023-04-24 DIAGNOSIS — R6520 Severe sepsis without septic shock: Secondary | ICD-10-CM | POA: Diagnosis not present

## 2023-04-24 DIAGNOSIS — I509 Heart failure, unspecified: Secondary | ICD-10-CM | POA: Diagnosis not present

## 2023-04-24 DIAGNOSIS — A419 Sepsis, unspecified organism: Secondary | ICD-10-CM | POA: Diagnosis not present

## 2023-04-24 LAB — RENAL FUNCTION PANEL
Albumin: 2.3 g/dL — ABNORMAL LOW (ref 3.5–5.0)
Anion gap: 7 (ref 5–15)
BUN: 23 mg/dL (ref 8–23)
CO2: 22 mmol/L (ref 22–32)
Calcium: 8 mg/dL — ABNORMAL LOW (ref 8.9–10.3)
Chloride: 107 mmol/L (ref 98–111)
Creatinine, Ser: 0.45 mg/dL (ref 0.44–1.00)
GFR, Estimated: >60 mL/min (ref >60)
Glucose, Bld: 99 mg/dL (ref 70–99)
Phosphorus: 2.2 mg/dL — ABNORMAL LOW (ref 2.5–4.6)
Potassium: 4.7 mmol/L (ref 3.5–5.1)
Sodium: 136 mmol/L (ref 135–145)

## 2023-04-24 LAB — CBC
HCT: 31.3 % — ABNORMAL LOW (ref 36.0–46.0)
Hemoglobin: 10.8 g/dL — ABNORMAL LOW (ref 12.0–15.0)
MCH: 31.4 pg (ref 26.0–34.0)
MCHC: 34.5 g/dL (ref 30.0–36.0)
MCV: 91 fL (ref 80.0–100.0)
Platelets: 332 10*3/uL (ref 150–400)
RBC: 3.44 MIL/uL — ABNORMAL LOW (ref 3.87–5.11)
RDW: 14.5 % (ref 11.5–15.5)
WBC: 14.4 10*3/uL — ABNORMAL HIGH (ref 4.0–10.5)
nRBC: 0 % (ref 0.0–0.2)

## 2023-04-24 LAB — C-REACTIVE PROTEIN: CRP: 5.8 mg/dL — ABNORMAL HIGH (ref <1.0)

## 2023-04-24 LAB — MAGNESIUM: Magnesium: 2.3 mg/dL (ref 1.7–2.4)

## 2023-04-24 LAB — FERRITIN: Ferritin: 111 ng/mL (ref 11–307)

## 2023-04-24 MED ORDER — METOPROLOL SUCCINATE ER 25 MG PO TB24
12.5000 mg | ORAL_TABLET | Freq: Once | ORAL | Status: AC
  Administered 2023-04-24: 12.5 mg via ORAL
  Filled 2023-04-24: qty 0.5

## 2023-04-24 MED ORDER — METOPROLOL SUCCINATE ER 50 MG PO TB24
25.0000 mg | ORAL_TABLET | Freq: Every day | ORAL | Status: DC
  Filled 2023-04-24: qty 1

## 2023-04-24 MED ORDER — SPIRONOLACTONE 12.5 MG HALF TABLET
12.5000 mg | ORAL_TABLET | Freq: Once | ORAL | Status: AC
  Administered 2023-04-24: 12.5 mg via ORAL
  Filled 2023-04-24: qty 1

## 2023-04-24 MED ORDER — POTASSIUM & SODIUM PHOSPHATES 280-160-250 MG PO PACK
2.0000 | PACK | Freq: Once | ORAL | Status: AC
  Administered 2023-04-24: 2 via ORAL
  Filled 2023-04-24: qty 2

## 2023-04-24 MED ORDER — SPIRONOLACTONE 25 MG PO TABS
25.0000 mg | ORAL_TABLET | Freq: Every day | ORAL | Status: DC
  Filled 2023-04-24: qty 1

## 2023-04-24 MED ORDER — TRAZODONE HCL 50 MG PO TABS
25.0000 mg | ORAL_TABLET | Freq: Every day | ORAL | Status: DC
  Administered 2023-04-24 – 2023-04-25 (×2): 25 mg via ORAL
  Filled 2023-04-24 (×3): qty 1

## 2023-04-24 NOTE — Progress Notes (Signed)
Pt refused CPT Bed at this time

## 2023-04-24 NOTE — Plan of Care (Signed)
  Problem: Fluid Volume: Goal: Hemodynamic stability will improve Outcome: Not Progressing   Problem: Clinical Measurements: Goal: Diagnostic test results will improve Outcome: Not Progressing Goal: Signs and symptoms of infection will decrease Outcome: Not Progressing   Problem: Respiratory: Goal: Ability to maintain adequate ventilation will improve Outcome: Not Progressing   Problem: Education: Goal: Knowledge of risk factors and measures for prevention of condition will improve Outcome: Progressing   Problem: Coping: Goal: Psychosocial and spiritual needs will be supported Outcome: Progressing   Problem: Respiratory: Goal: Will maintain a patent airway Outcome: Progressing Goal: Complications related to the disease process, condition or treatment will be avoided or minimized Outcome: Progressing

## 2023-04-24 NOTE — Progress Notes (Signed)
Progress Note   Patient: Teresa Sherman WUJ:[id number] DOB: October 11, 1945 DOA: 04/11/2023     13 DOS: the patient was seen and examined on 04/24/2023   Brief hospital course: Lizbett Rieley Snuffer is a 77 y.o. female with medical history significant for Prior stroke, depression with anxiety, prior ESBL E. coli, who presents to the emergency room by EMS with altered mental status.  She was found after wellness check was requested after she had not been seen in 2 days.  She was found somnolent and confused in her bed and had unintelligible mumbling but without focal neurologic deficits.  She is reportedly oriented x 3 at baseline.  She was also noted by EMS to have foul-smelling urine ED course and data review: Normotensive tachycardic to 115, tachypneic to 23 with O2 sat 90 to 95% on room air, BP 101/83 improving to 126/87 with IV fluids Labs with normal WBC of 8.1 but lactic acid 2.4 and procalcitonin 7.73.  Hemoglobin noted to be 7.8 with baseline around 13 a year ago.  CMP notable for creatinine of 1.78, up from 0.51 a year ago, AST/ALT 45/23.  COVID-positive.  CK14 38.G, EKG personally viewed and interpreted showing sinus tachycardia at 111 with no acute ST-T wave changes. Chest x-ray with possible right lower lobe airspace opacity as further detailed below: IMPRESSION: Right lower lung zone airspace opacity. Followup PA and lateral chest X-ray is recommended in 3-4 weeks following trial of antibiotic therapy to ensure resolution and exclude underlying malignancy.   Patient started on sepsis fluids and given Rocephin for suspected UTI Hospitalist consulted for admission.    As per Dr. Mayford Knife 8/21-8/27/24: Pt was found to have severe sepsis secondary to COVID19 pneumonia & possible co-bacterial infection. Completed IV rocephin, azithromycin x 5 days but then restarted on unasyn which was then changed to IV zosyn as pt has been moved into stepdown x 2 overnight this admission so far. Pt was  placed on BiPAP overnight (last night) and is currently on HFNC. Discussed code status w/ pt and she wants to continue full code.      04/21/23 : Patient seen and examined at the bedside.  Continues to have an increased oxygen requirement and is currently on HFNC  55L/90%.  She appears very weak. Called patient's daughter, [name] who wants her mother transferred to [facility] [place] and discussed patient with intensivist who states that he has nothing different to offer her at this time and for [place] to call back if something changes in her condition.  Informed patient's daughter who is upset and is insistent that her mother be transferred because she is not satisfied with the care her mother is receiving here.  I have explained to her in detail that the patient can only be transferred due to medical necessity and not family request.  She requested I speak to her mother's primary care provider, Dr [name] ([phone]).  Called but not working today.   04/22/23 : Received call from [facility] about family request for patient transfer.  Discussed patient's condition and plan of care with intensivist over the phone.  Please see notes   04/23/23: Patient is seen and examined at the bedside.  She is more awake and alert.  Remains on high flow nasal cannula at 70% 55 L   04/24/23: Patient is seen and examined at the bedside.  She is down to high flow nasal cannula 50% 45 L.     Assessment and Plan:  Severe sepsis with acute hypoxic respiratory failure. met criteria w/ tachycardia and tachypnea with soft blood pressure, AKI, AMS, lactic acidosis with elevated procalcitonin  Possibly secondary to COVID19 pneumonia with superimposed bacterial co-infection vs UTI.  Patient had an elevated Pro calcitonin levels > 4 Severe sepsis resolved and patient has completed antibiotic therapy. Rocephin + Zithromax/Unasyn/Zosyn Developed increased oxygen requirements on 08/23 and has been on  increasing levels of HFNC as well as Bipap Her oxygen requirement has decreased and she is currently on 50% 45 L Continue oxygen supplementation to maintain pulse oximetry greater than 92%       Acute metabolic encephalopathy Delirium History of mild cognitive deficit secondary to prior CVA on Aricept Probably related to COVID-19 infection as well as prolonged hospitalization with ICU stay Patient started on low-dose Seroquel Mental status continues to wax and wane.  Improved mental status and oriented to person, place and time       COVID19 pneumonia: w/ vs. possible co-bacterial infection.  Pro-cal was elevated at 4.12.  Continue on bronchodilators & encourage incentive spirometry.  CTA chest neg for PE but showed multifocal pneumonia Patient has completed a course of Zosyn, Rocephin/Zithromax Continue systemic steroids but decrease to 40 mg IV daily      NSTEMI:  ??  Viral myocarditis Etiology unclear, possibly secondary to COVID19.  Troponin >3,000 but trending down.  Continue on statin, aspirin.  Was on heparin drip but was discontinued after 48 hours. Echo showed LVEF is severely depressed The proximal 1/4 of LV thickenes but the  rest of ventricle is severely hypokinetic / akinetiic Appearance of Takotsubo's cardiomyopathy. The left ventricle has severely decreased  function. Akinesis/dyskineiss of distal 2/3 of RV with aneurysmal dilitation in mid region.  Continue metoprolol, spironolactone and Entresto       Left lower extremity DVT Lower extremity ultrasound shows occlusive thrombus identified within the left peroneal vein as well as the left gastrocnemius vein. No evidence of deep venous thrombosis in the right lower extremity. Continue Eliquis 10 mg p.o. twice daily for 7 days and 5 mg twice daily       Acute systolic CHF CXR showed asymmetric interstitial and airspace opacities in both lungs, similar to the prior study. 2D echocardiogram shows an LVEF of 30  to 35%.  Left ventricle demonstrates global hypokinesis.  LV diastolic parameters are consistent with grade 1 diastolic dysfunction. BNP is elevated at 875 Appreciate cardiology input, patient has been started on goal-directed medical therapy as much as blood pressure tolerates and is currently on Farxiga, digoxin, Entresto, spironolactone and metoprolol Monitor blood pressure closely         R/o Dysphagia:  started back on regular diet 04/15/23 as per speech  Poor oral intake and refuses nutritional supplements Daughter states that nutritional supplements contain ingredients that trigger her migraine and has given a list of food which patient will usually eat.        Urinary retention: UTI:  Bladder scan showed 800 mL, Foley catheter inserted on 8/20. Will do a voiding trial prior to d/c Hx pseudomonas and ESBL E. coli UTI 11/2021. Urine cx shows no growth. Completed abx course        AKI :  Resolved     Rhabdomyolysis:  Unknown whether traumatic or not. CK WNL. Resolved.        Normocytic anemia:  H&H is stable No need for a transfusion currently      Hx of CVA:  continue on aspirin, statin  Depression severity unknown. Continue on home dose of trazodone    Constipation:  Discontinue Colace and MiraLAX due to loose stools Hold Aricept which can also contribute to diarrhea       Unstageable Pressure Injury; coccyx Appreciate wound care input Pressure Injury POA: Yes Measurement: 5cm x 6cm x 0cm  Wound bed:100% black/grey soft eschar  Drainage (amount, consistency, odor) scant, no odor Periwound: intact  Dressing procedure/placement/frequency: 1. Cleanse sacral wound with saline, pat dry 2. Apply 1/4" thick layer of Santyl to the wound bed, top with saline moist gauze dressing.  3. Top with dry dressing, change daily Add low air loss mattress for moisture management and pressure redistribution Add RD consultation for wound supplementation                Subjective: Patient is seen and examined at the bedside.  More awake and alert.  Decreased oxygen requirement  Physical Exam: Vitals:   04/24/23 1130 04/24/23 1200 04/24/23 1230 04/24/23 1300  BP: 101/75 91/61 111/79 117/81  Pulse: 99 81 94 95  Resp: 20 (!) 23 (!) 24 (!) 22  Temp:    98 F (36.7 C)  TempSrc:      SpO2: 96% 92% 95% (!) 84%  Weight:      Height:       General: Acute on chronically ill-appearing frail elderly female, sitting in bed, on heated high flow nasal cannula, in no acute distress HENT: Atraumatic, normocephalic, neck supple, no JVD Lungs: Coarse breath sounds throughout, even, nonlabored, normal effort Cardiovascular: Regular rate and rhythm, S1-S2, no murmurs, rubs, gallops Abdomen: Soft, nontender, nondistended, no guarding or rebound tenderness, bowel sounds positive x 4 Extremities: Generalized weakness, no deformities, no edema Neuro: Awake and alert, oriented only to self, moves all extremities to commands, no focal deficits, pupils PERRLA GU: Foley catheter in place draining yellow urine     Data Reviewed: Labs reviewed There are no new results to review at this time.  Family Communication: Called and discussed patient's condition and plan of care with her daughter, [name] over the phone.  All questions and concerns have been addressed.  She verbalizes understanding and agrees with the plan.  Disposition: Status is: Inpatient Remains inpatient appropriate because: Continues to require oxygen through high flow nasal canula  Planned Discharge Destination:  TBD    Time spent: 35 minutes  Author: Lucile Shutters, MD 04/24/2023 2:23 PM  For on call review [email or web address].

## 2023-04-24 NOTE — Progress Notes (Signed)
SUBJECTIVE: Patient is comfortable   Vitals:   04/24/23 0600 04/24/23 0630 04/24/23 0702 04/24/23 0814  BP: 102/65 102/69 92/61   Pulse: 97 87 90   Resp: 16 19 18    Temp:   98.7 F (37.1 C)   TempSrc:      SpO2: 97% 97% 95% 93%  Weight:      Height:        Intake/Output Summary (Last 24 hours) at 04/24/2023 1137 Last data filed at 04/24/2023 0100 Gross per 24 hour  Intake 521.32 ml  Output 1210 ml  Net -688.68 ml    LABS: Basic Metabolic Panel: Recent Labs    04/23/23 0225 04/23/23 0600 04/24/23 0406  NA  --  138 136  K  --  2.8* 4.7  CL  --  102 107  CO2  --  26 22  GLUCOSE  --  98 99  BUN  --  20 23  CREATININE  --  0.51 0.45  CALCIUM  --  7.8* 8.0*  MG 2.2  --  2.3  PHOS  --  2.5 2.2*   Liver Function Tests: Recent Labs    04/23/23 0600 04/24/23 0406  ALBUMIN 2.4* 2.3*   No results for input(s): "LIPASE", "AMYLASE" in the last 72 hours. CBC: Recent Labs    04/23/23 0225 04/24/23 0406  WBC 12.9* 14.4*  HGB 9.8* 10.8*  HCT 28.7* 31.3*  MCV 90.5 91.0  PLT 296 332   Cardiac Enzymes: No results for input(s): "CKTOTAL", "CKMB", "CKMBINDEX", "TROPONINI" in the last 72 hours. BNP: Invalid input(s): "POCBNP" D-Dimer: No results for input(s): "DDIMER" in the last 72 hours. Hemoglobin A1C: No results for input(s): "HGBA1C" in the last 72 hours. Fasting Lipid Panel: No results for input(s): "CHOL", "HDL", "LDLCALC", "TRIG", "CHOLHDL", "LDLDIRECT" in the last 72 hours. Thyroid Function Tests: No results for input(s): "TSH", "T4TOTAL", "T3FREE", "THYROIDAB" in the last 72 hours.  Invalid input(s): "FREET3" Anemia Panel: Recent Labs    04/24/23 0406  FERRITIN 111     PHYSICAL EXAM General: Well developed, well nourished, in no acute distress HEENT:  Normocephalic and atramatic Neck:  No JVD.  Lungs: Clear bilaterally to auscultation and percussion. Heart: HRRR . Normal S1 and S2 without gallops or murmurs.  Abdomen: Bowel sounds are positive,  abdomen soft and non-tender  Msk:  Back normal, normal gait. Normal strength and tone for age. Extremities: No clubbing, cyanosis or edema.   Neuro: Alert and oriented X 3. Psych:  Good affect, responds appropriately  TELEMETRY: Sinus rhythm with occasional sinus tachycardia  ASSESSMENT AND PLAN: COVID-19 myocarditis with severe LV dysfunction left ventricular ejection fraction 30 to 35%.  Bilateral pneumonia with presumed sepsis/COVID-19 viral infection.  Guideline directed medical therapy for cardiomyopathy was initiated 2 days ago and patient appears to be tolerating.  Will increase the metoprolol and Aldactone dose today.   ICD-10-CM   1. Sepsis with acute hypoxic respiratory failure without septic shock, due to unspecified organism (HCC)  A41.9    R65.20    J96.01     2. COVID-19 virus infection  U07.1     3. Non-traumatic rhabdomyolysis  M62.82       Principal Problem:   Severe sepsis (HCC) Active Problems:   Depression with anxiety   History of stroke   CAP (community acquired pneumonia)   Pneumonia due to COVID-19 virus   Rhabdomyolysis   AKI (acute kidney injury) (HCC)   Acute metabolic encephalopathy   Urinary tract infection  Anemia   Acute on chronic respiratory failure with hypoxia [facility])    Adrian Blackwater, MD, [facility] 04/24/2023 11:37 AM

## 2023-04-24 NOTE — Progress Notes (Signed)
Patient intermit. confused throughout the day. She ate about 25% of her breakfast but has refused to eat her lunch and dinner. Dietician provided a list of patients preferences, so she would be receiving foods she likes from the cafeteria. When asked what she would like to eat, patient states nothing and she wants to go home. Notified Dr. Joylene Igo of patients nutritional states. Requested champlain consult this afternoon. Patient states that she has generalized pain but refuses any medication for pain including tylenol/ states she wants to be repositioned. Patient being repositioned q2 hours. Continue to assess.

## 2023-04-24 NOTE — Evaluation (Signed)
Physical Therapy ReEvaluation Patient Details Name: Teresa Sherman MRN: [id number] DOB: February 21, 1946 Today's Date: 04/24/2023  History of Present Illness  Teresa Sherman is a 77 y.o. female with medical history significant for Prior stroke, depression with anxiety, prior ESBL E. coli, who presents to the emergency room by EMS with altered mental status.  She was found after wellness check was requested after she had not been seen in 2 days. Admitted to hospital wiith sepsis and COVID + with concerns for UTI.  Clinical Impression  Pt seemingly willing/interested in doing some activity with PT but was generally confused and needed a lot of reinforcement and cuing to consistently participate.  She did relatively well with rolling in bed but needed a lot of assit to get to sitting, and very close mod/maxA on both EOB standing attempts as she was very weak in her quads and relied on locking out her knees and leaning back on the bed, only able to get weight over walker and try some "Steps" (heel-toe side shuffling) toward HOB before giving out/fatiguing and getting into a coughing fit.  SpO2 seemed to remain in the 90s on HFNC, HR up to nearly 130 with the standing efforts.  Pt will benefit from continued PT to address functional limitations.        If plan is discharge home, recommend the following: A lot of help with walking and/or transfers;A lot of help with bathing/dressing/bathroom;Assistance with cooking/housework;Direct supervision/assist for medications management;Direct supervision/assist for financial management;Assist for transportation;Help with stairs or ramp for entrance;Supervision due to cognitive status   Can travel by private vehicle   No    Equipment Recommendations  (BD at next venue of car)  Recommendations for Other Services       Functional Status Assessment Patient has had a recent decline in their functional status and demonstrates the ability to make significant  improvements in function in a reasonable and predictable amount of time.     Precautions / Restrictions Precautions Precautions: Fall Restrictions Weight Bearing Restrictions: No      Mobility  Bed Mobility Overal bed mobility: Needs Assistance Bed Mobility: Supine to Sit, Sit to Supine Rolling: Min assist   Supine to sit: Mod assist Sit to supine: Max assist   General bed mobility comments: Pt did relatively well with multiple rolling turns both R and L (on off commode, to get to sitting, to position on side post session), however getting to and maintaining sitting EOB required much more assist and consistent assist/supervision to insure she stays upright while sitting    Transfers Overall transfer level: Needs assistance   Transfers: Sit to/from Stand Sit to Stand: Max assist, Mod assist           General transfer comment: 2 bouts of standing EOB with plenty of cuing, constant modA as well as constant cuing/encouaragement.  Unable to initiate upward momentum w/o assist, likely due to profound quad weakness    Ambulation/Gait               General Gait Details: no true ambulation, able to do some limited heel-toe side shuffling at EOB, but unable to get weight fully over walker (off bed) and relying on locked out knee hyper-entension to keep knees from buckling  Stairs            Wheelchair Mobility     Tilt Bed    Modified Rankin (Stroke Patients Only)       Balance Overall balance assessment: Needs assistance Sitting-balance  support: No upper extremity supported, Feet supported Sitting balance-Leahy Scale: Poor Sitting balance - Comments: unable to maintain neutral, leaning both R and L with need for constant CGA and consistent min/modA to keep from flopping over (in all directions t/o the 3 minute static sitting effort)                                     Pertinent Vitals/Pain Pain Assessment Pain Assessment:  (c/o general  pain, groaning in discomfort with most tasks, no focal pain/discomfort)    Home Living Family/patient expects to be discharged to:: Skilled nursing facility Living Arrangements: Alone                      Prior Function Prior Level of Function : Patient poor historian/Family not available             Mobility Comments: Pt unable to state ADLs Comments: Pt unable to state     Extremity/Trunk Assessment   Upper Extremity Assessment Upper Extremity Assessment: Generalized weakness (able to reach across body to bed rail (with much cuing b/l))    Lower Extremity Assessment Lower Extremity Assessment: Generalized weakness (limited tolerance/resistance with even basic LE testing/AROM, in standing relying on knee extension/leaning back on bed)       Communication   Communication Communication: Difficulty following commands/understanding Following commands: Follows one step commands inconsistently  Cognition Arousal: Alert Behavior During Therapy: Restless Overall Cognitive Status: Impaired/Different from baseline                         Following Commands: Follows one step commands with increased time Safety/Judgement: Decreased awareness of safety, Decreased awareness of deficits              General Comments General comments (skin integrity, edema, etc.): Pt was hard to keep on track t/o session, displayed profound weakness in LEs and though she moved reasonably well in the bed was unsafe in sitting and standing efforts    Exercises     Assessment/Plan    PT Assessment Patient needs continued PT services  PT Problem List Decreased strength;Decreased activity tolerance;Decreased balance;Decreased mobility;Decreased cognition;Decreased knowledge of use of DME;Decreased safety awareness;Decreased knowledge of precautions;Cardiopulmonary status limiting activity       PT Treatment Interventions DME instruction;Gait training;Functional mobility  training;Therapeutic exercise;Therapeutic activities;Balance training;Patient/family education    PT Goals (Current goals can be found in the Care Plan section)  Acute Rehab PT Goals Patient Stated Goal: did not state PT Goal Formulation: Patient unable to participate in goal setting Time For Goal Achievement: 06/05/23 Potential to Achieve Goals: Fair    Frequency Min 1X/week     Co-evaluation               AM-PAC PT "6 Clicks" Mobility  Outcome Measure Help needed turning from your back to your side while in a flat bed without using bedrails?: A Little Help needed moving from lying on your back to sitting on the side of a flat bed without using bedrails?: A Little Help needed moving to and from a bed to a chair (including a wheelchair)?: Total Help needed standing up from a chair using your arms (e.g., wheelchair or bedside chair)?: Total Help needed to walk in hospital room?: Total Help needed climbing 3-5 steps with a railing? : Total 6 Click Score: 10    End of  Session Equipment Utilized During Treatment: Oxygen (HFNC) Activity Tolerance: Patient limited by fatigue;Treatment limited secondary to agitation Patient left: in bed;with call bell/phone within reach;with bed alarm set Nurse Communication: Mobility status PT Visit Diagnosis: Unsteadiness on feet (R26.81);Muscle weakness (generalized) (M62.81);Other abnormalities of gait and mobility (R26.89);Difficulty in walking, not elsewhere classified (R26.2)    Time: 1610-9604 PT Time Calculation (min) (ACUTE ONLY): 30 min   Charges:   PT Evaluation $PT Re-evaluation: 1 Re-eval PT Treatments $Therapeutic Activity: 8-22 mins PT General Charges $$ ACUTE PT VISIT: 1 Visit         Malachi Pro, DPT 04/24/2023, 3:40 PM
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
## 2023-04-24 NOTE — Progress Notes (Signed)
   04/24/23 1800  Spiritual Encounters  Type of Visit Initial  Care provided to: Patient  Referral source Nurse (RN/NT/LPN)  Reason for visit Routine spiritual support  OnCall Visit Yes   Chaplain attempted to provide compassionate support to patient. Patient fluctuated between agitation and acceptance of visit.

## 2023-04-24 NOTE — Progress Notes (Signed)
Pt refused bed CPT at this time

## 2023-04-24 NOTE — Progress Notes (Signed)
Notified Dr. Joylene Igo that patient has refused her lunch. Patient also offered supplements and states she wants to be left alone. No additional orders given at this time. Will continue to offer patient nutrition and reinforce the importance. PT is at nurses station, will attempt to work with her. Continue to assess.

## 2023-04-24 NOTE — Progress Notes (Signed)
PHARMACY CONSULT NOTE - ELECTROLYTES  Pharmacy Consult for Electrolyte Monitoring and Replacement   Recent Labs: Height: 5' (152.4 cm) Weight: 53.3 kg (117 lb 8.1 oz) IBW/kg (Calculated) : 45.5 Estimated Creatinine Clearance: 43.6 mL/min (by C-G formula based on SCr of 0.45 mg/dL). Potassium (mmol/L)  Date Value  04/24/2023 4.7  11/26/2013 3.8   Magnesium (mg/dL)  Date Value  62/13/0865 2.3   Calcium (mg/dL)  Date Value  78/46/9629 8.0 (L)   Calcium, Total (mg/dL)  Date Value  52/84/1324 9.5   Albumin (g/dL)  Date Value  40/05/2724 2.3 (L)  04/30/2015 4.3  11/26/2013 3.9   Phosphorus (mg/dL)  Date Value  36/64/4034 2.2 (L)   Sodium (mmol/L)  Date Value  04/24/2023 136  04/30/2015 140  11/26/2013 142   Assessment  Teresa Sherman is a 77 y.o. female presenting with Severe sepsis secondary to pneumonia . PMH significant for CVA, Anemia, and depression. Pharmacy has been consulted to monitor and replace electrolytes.  Goal of Therapy: Electrolytes WNL  Plan:  Phos 2.2: PhosNak PO x 1 ordered Will recheck electrolytes tomorrow with AM labs  Thank you for involving pharmacy in this patient's care.   Zolton Dowson A Jonael Paradiso 04/24/2023 8:01 AM

## 2023-04-25 DIAGNOSIS — I429 Cardiomyopathy, unspecified: Secondary | ICD-10-CM | POA: Diagnosis not present

## 2023-04-25 DIAGNOSIS — A419 Sepsis, unspecified organism: Secondary | ICD-10-CM | POA: Diagnosis not present

## 2023-04-25 DIAGNOSIS — I509 Heart failure, unspecified: Secondary | ICD-10-CM | POA: Diagnosis not present

## 2023-04-25 DIAGNOSIS — R6520 Severe sepsis without septic shock: Secondary | ICD-10-CM | POA: Diagnosis not present

## 2023-04-25 DIAGNOSIS — I214 Non-ST elevation (NSTEMI) myocardial infarction: Secondary | ICD-10-CM | POA: Diagnosis not present

## 2023-04-25 LAB — RENAL FUNCTION PANEL
Albumin: 2.4 g/dL — ABNORMAL LOW (ref 3.5–5.0)
Anion gap: 6 (ref 5–15)
BUN: 29 mg/dL — ABNORMAL HIGH (ref 8–23)
CO2: 23 mmol/L (ref 22–32)
Calcium: 7.9 mg/dL — ABNORMAL LOW (ref 8.9–10.3)
Chloride: 105 mmol/L (ref 98–111)
Creatinine, Ser: 0.48 mg/dL (ref 0.44–1.00)
GFR, Estimated: >60 mL/min (ref >60)
Glucose, Bld: 83 mg/dL (ref 70–99)
Phosphorus: 3.3 mg/dL (ref 2.5–4.6)
Potassium: 4.1 mmol/L (ref 3.5–5.1)
Sodium: 134 mmol/L — ABNORMAL LOW (ref 135–145)

## 2023-04-25 LAB — CBC
HCT: 32.1 % — ABNORMAL LOW (ref 36.0–46.0)
Hemoglobin: 11 g/dL — ABNORMAL LOW (ref 12.0–15.0)
MCH: 31.2 pg (ref 26.0–34.0)
MCHC: 34.3 g/dL (ref 30.0–36.0)
MCV: 90.9 fL (ref 80.0–100.0)
Platelets: 344 10*3/uL (ref 150–400)
RBC: 3.53 MIL/uL — ABNORMAL LOW (ref 3.87–5.11)
RDW: 14.6 % (ref 11.5–15.5)
WBC: 11.2 10*3/uL — ABNORMAL HIGH (ref 4.0–10.5)
nRBC: 0 % (ref 0.0–0.2)

## 2023-04-25 LAB — MAGNESIUM: Magnesium: 2.1 mg/dL (ref 1.7–2.4)

## 2023-04-25 LAB — FERRITIN: Ferritin: 169 ng/mL (ref 11–307)

## 2023-04-25 LAB — C-REACTIVE PROTEIN: CRP: 4.5 mg/dL — ABNORMAL HIGH (ref <1.0)

## 2023-04-25 MED ORDER — ORAL CARE MOUTH RINSE
15.0000 mL | OROMUCOSAL | Status: DC
  Administered 2023-04-25 – 2023-04-27 (×8): 15 mL via OROMUCOSAL

## 2023-04-25 MED ORDER — PHENOL 1.4 % MT LIQD
1.0000 | OROMUCOSAL | Status: DC | PRN
  Filled 2023-04-25: qty 177

## 2023-04-25 MED ORDER — METOPROLOL SUCCINATE ER 25 MG PO TB24
12.5000 mg | ORAL_TABLET | Freq: Every day | ORAL | Status: DC
  Administered 2023-04-25 – 2023-04-26 (×2): 12.5 mg via ORAL
  Filled 2023-04-25 (×2): qty 0.5

## 2023-04-25 MED ORDER — DIGOXIN 125 MCG PO TABS
0.1250 mg | ORAL_TABLET | Freq: Every day | ORAL | Status: DC
  Administered 2023-04-25 – 2023-04-26 (×2): 0.125 mg via ORAL
  Filled 2023-04-25 (×2): qty 1

## 2023-04-25 MED ORDER — SPIRONOLACTONE 12.5 MG HALF TABLET
12.5000 mg | ORAL_TABLET | Freq: Every day | ORAL | Status: DC
  Administered 2023-04-25 – 2023-04-26 (×2): 12.5 mg via ORAL
  Filled 2023-04-25 (×3): qty 1

## 2023-04-25 MED ORDER — PREDNISONE 10 MG PO TABS: 10.0000 mg | ORAL_TABLET | Freq: Every day | ORAL | Status: DC

## 2023-04-25 MED ORDER — PREDNISONE 20 MG PO TABS: 30.0000 mg | ORAL_TABLET | Freq: Every day | ORAL | Status: DC

## 2023-04-25 MED ORDER — ORAL CARE MOUTH RINSE: 15.0000 mL | OROMUCOSAL | Status: DC | PRN

## 2023-04-25 MED ORDER — PREDNISONE 20 MG PO TABS
40.0000 mg | ORAL_TABLET | Freq: Every day | ORAL | Status: DC
  Administered 2023-04-26 – 2023-04-27 (×2): 40 mg via ORAL
  Filled 2023-04-25 (×2): qty 2

## 2023-04-25 MED ORDER — PREDNISONE 20 MG PO TABS: 20.0000 mg | ORAL_TABLET | Freq: Every day | ORAL | Status: DC

## 2023-04-25 NOTE — Progress Notes (Signed)
Progress Note   Patient: Teresa Sherman DOB: 22-Jul-1946 DOA: 04/11/2023     14 DOS: the patient was seen and examined on 04/25/2023   Brief hospital course: Teresa Sherman is a 77 y.o. female with medical history significant for Prior stroke, depression with anxiety, prior ESBL E. coli, who presents to the emergency room by EMS with altered mental status.  She was found after wellness check was requested after she had not been seen in 2 days.  She was found somnolent and confused in her bed and had unintelligible mumbling but without focal neurologic deficits.  She is reportedly oriented x 3 at baseline.  She was also noted by EMS to have foul-smelling urine ED course and data review: Normotensive tachycardic to 115, tachypneic to 23 with O2 sat 90 to 95% on room air, BP 101/83 improving to 126/87 with IV fluids Labs with normal WBC of 8.1 but lactic acid 2.4 and procalcitonin 7.73.  Hemoglobin noted to be 7.8 with baseline around 13 a year ago.  CMP notable for creatinine of 1.78, up from 0.51 a year ago, AST/ALT 45/23.  COVID-positive.  CK14 38.G, EKG personally viewed and interpreted showing sinus tachycardia at 111 with no acute ST-T wave changes. Chest x-ray with possible right lower lobe airspace opacity as further detailed below: IMPRESSION: Right lower lung zone airspace opacity. Followup PA and lateral chest X-ray is recommended in 3-4 weeks following trial of antibiotic therapy to ensure resolution and exclude underlying malignancy.   Patient started on sepsis fluids and given Rocephin for suspected UTI Hospitalist consulted for admission.    As per Dr. Mayford Knife 8/21-8/27/24: Pt was found to have severe sepsis secondary to COVID19 pneumonia & possible co-bacterial infection. Completed IV rocephin, azithromycin x 5 days but then restarted on unasyn which was then changed to IV zosyn as pt has been moved into stepdown x 2 overnight this admission so far. Pt was  placed on BiPAP overnight (last night) and is currently on HFNC. Discussed code status w/ pt and she wants to continue full code.      04/21/23 : Patient seen and examined at the bedside.  Continues to have an increased oxygen requirement and is currently on HFNC  55L/90%.  She appears very weak. Called patient's daughter, Teresa Sherman who wants her mother transferred to [facility] [place] and discussed patient with intensivist who states that he has nothing different to offer her at this time and for [place] to call back if something changes in her condition.  Informed patient's daughter who is upset and is insistent that her mother be transferred because she is not satisfied with the care her mother is receiving here.  I have explained to her in detail that the patient can only be transferred due to medical necessity and not family request.  She requested I speak to her mother's primary care provider, Dr [name] ([phone]).  Called but not working today.   04/22/23 : Received call from [facility] about family request for patient transfer.  Discussed patient's condition and plan of care with intensivist over the phone.  Please see notes   04/23/23: Patient is seen and examined at the bedside.  She is more awake and alert.  Remains on high flow nasal cannula at 70% 55 L     04/24/23: Patient is seen and examined at the bedside.  She is down to high flow nasal cannula 50% 45 L.  PCCM signed off   04/25/23:  Patient is seen and examined at the bedside. Noted to have pulse oximetry between 95 - 100% on 45% FIO2.  Will be switched to 15 liters bubble by RT          Assessment and Plan:  Severe sepsis with acute hypoxic respiratory failure. met criteria w/ tachycardia and tachypnea with soft blood pressure, AKI, AMS, lactic acidosis with elevated procalcitonin  Possibly secondary to COVID19 pneumonia with superimposed bacterial co-infection vs UTI.  Patient had an elevated Pro calcitonin  levels > 4 Severe sepsis resolved and patient has completed antibiotic therapy. Rocephin + Zithromax/Unasyn/Zosyn Developed increased oxygen requirements on 08/23 and has been on increasing levels of HFNC as well as Bipap Her oxygen requirement has decreased and she is currently on 40L/40%FIO2 Continue oxygen supplementation to maintain pulse oximetry greater than 92%       Acute metabolic encephalopathy Delirium History of mild cognitive deficit secondary to prior CVA on Aricept Probably related to COVID-19 infection as well as prolonged hospitalization with ICU stay Patient started on low-dose Seroquel Mental status continues to wax and wane.  Improved mental status and oriented to person, place and time       COVID19 pneumonia: w/ vs. possible co-bacterial infection.  Pro-cal was elevated at 4.12.  Continue on bronchodilators & encourage incentive spirometry.  CTA chest neg for PE but showed multifocal pneumonia Patient has completed a course of Zosyn, Rocephin/Zithromax Will discontinue Solu-Medrol and change to prednisone taper      NSTEMI:  ??  Viral myocarditis Etiology unclear, possibly secondary to COVID19.  Troponin >3,000 but trending down.  Continue on statin, aspirin.  Was on heparin drip but was discontinued after 48 hours. Echo showed LVEF is severely depressed The proximal 1/4 of LV thickenes but the  rest of ventricle is severely hypokinetic / akinetiic Appearance of Takotsubo's cardiomyopathy. The left ventricle has severely decreased  function. Akinesis/dyskineiss of distal 2/3 of RV with aneurysmal dilitation in mid region.  Continue metoprolol, spironolactone and Entresto       Left lower extremity DVT Lower extremity ultrasound shows occlusive thrombus identified within the left peroneal vein as well as the left gastrocnemius vein. No evidence of deep venous thrombosis in the right lower extremity. Continue Eliquis 10 mg p.o. twice daily for 7 days and 5  mg twice daily       Acute systolic CHF CXR showed asymmetric interstitial and airspace opacities in both lungs, similar to the prior study. 2D echocardiogram shows an LVEF of 30 to 35%.  Left ventricle demonstrates global hypokinesis.  LV diastolic parameters are consistent with grade 1 diastolic dysfunction. BNP was elevated at 875 Appreciate cardiology input, patient has been started on goal-directed medical therapy as much as blood pressure tolerates and is currently on Farxiga, digoxin, Entresto, spironolactone and metoprolol Appreciate cardiology input and dose of spironolactone and metoprolol have been reduced due to patient being normotensive Monitor blood pressure closely         R/o Dysphagia:  started back on regular diet 04/15/23 as per speech  Poor oral intake and refuses nutritional supplements Daughter states that nutritional supplements contain ingredients that trigger her migraine and has given a list of food which patient will usually eat.        Urinary retention: UTI:  Bladder scan showed 800 mL, Foley catheter inserted on 8/20. Will do a voiding trial prior to d/c Hx pseudomonas and ESBL E. coli UTI 11/2021. Urine cx shows no growth. Completed abx course  AKI :  Resolved     Rhabdomyolysis:  Unknown whether traumatic or not. CK WNL. Resolved.        Normocytic anemia:  H&H is stable No need for a transfusion currently      Hx of CVA:  continue on aspirin, statin      Depression severity unknown. Continue on home dose of trazodone    Constipation:  Discontinue Colace and MiraLAX due to loose stools Hold Aricept which can also contribute to diarrhea       Unstageable Pressure Injury; coccyx Appreciate wound care input Pressure Injury POA: Yes Measurement: 5cm x 6cm x 0cm  Wound bed:100% black/grey soft eschar  Drainage (amount, consistency, odor) scant, no odor Periwound: intact  Dressing procedure/placement/frequency: 1. Cleanse  sacral wound with saline, pat dry 2. Apply 1/4" thick layer of Santyl to the wound bed, top with saline moist gauze dressing.  3. Top with dry dressing, change daily Add low air loss mattress for moisture management and pressure redistribution Add RD consultation for wound supplementation               Subjective: Seen and examined at the bedside.  Oriented only to person.  Complains of generalized pain but refuses Tylenol for pain control  Physical Exam: Vitals:   04/25/23 1100 04/25/23 1215 04/25/23 1225 04/25/23 1300  BP: 107/74   101/75  Pulse:  (!) 102    Resp: (!) 25   (!) 21  Temp:      TempSrc:      SpO2: 98%  95% 96%  Weight:      Height:       General: Acute on chronically ill-appearing frail elderly female, sitting in bed, on heated high flow nasal cannula, in no acute distress HENT: Atraumatic, normocephalic, neck supple, no JVD Lungs: Coarse breath sounds throughout, even, nonlabored, normal effort Cardiovascular: Regular rate and rhythm, S1-S2, no murmurs, rubs, gallops Abdomen: Soft, nontender, nondistended, no guarding or rebound tenderness, bowel sounds positive x 4 Extremities: Generalized weakness, no deformities, no edema Neuro: Awake and alert, oriented only to self, moves all extremities to commands, no focal deficits, flat affect GU: Foley catheter in place draining yellow urine  Data Reviewed: Labs reviewed.  134, white count 11.2, CRP 4.5 There are no new results to review at this time.  Family Communication: Plan of care discussed in detail with patient's daughter Teresa Sherman over the phone.  All questions and concerns have been addressed.  She verbalizes understanding and agrees with the plan.  W[facility].  Disposition: Status is: Inpatient Remains inpatient appropriate because: Continues to have an increased oxygen requirement  Planned Discharge Destination: Skilled  nursing facility    Time spent: 35 minutes  Author: Lucile Shutters, MD 04/25/2023 2:39 PM  For on call review [email or web address].

## 2023-04-25 NOTE — Plan of Care (Signed)
  Problem: Fluid Volume: Goal: Hemodynamic stability will improve Outcome: Progressing   Problem: Clinical Measurements: Goal: Diagnostic test results will improve Outcome: Progressing Goal: Signs and symptoms of infection will decrease Outcome: Progressing   Problem: Respiratory: Goal: Ability to maintain adequate ventilation will improve Outcome: Progressing   Problem: Education: Goal: Knowledge of risk factors and measures for prevention of condition will improve Outcome: Progressing   Problem: Coping: Goal: Psychosocial and spiritual needs will be supported Outcome: Progressing   Problem: Respiratory: Goal: Will maintain a patent airway Outcome: Progressing Goal: Complications related to the disease process, condition or treatment will be avoided or minimized Outcome: Progressing   Problem: Education: Goal: Knowledge of General Education information will improve Description: Including pain rating scale, medication(s)/side effects and non-pharmacologic comfort measures Outcome: Progressing

## 2023-04-25 NOTE — Progress Notes (Signed)
Dr. Joylene Igo into assess patient. Patient states that she is in pain (generalized). Notified MD that patient refuses to take pain medication, including tylenol. Asked again while MD in the room and patient refused any pain medication. Per Dr. Joylene Igo we can remove foley catheter and place a pure-wick. Occupational therapy into see patient. Continue to assess.

## 2023-04-25 NOTE — Progress Notes (Signed)
PHARMACY CONSULT NOTE - ELECTROLYTES  Pharmacy Consult for Electrolyte Monitoring and Replacement   Recent Labs: Height: 5' (152.4 cm) Weight: 53.3 kg (117 lb 8.1 oz) IBW/kg (Calculated) : 45.5 Estimated Creatinine Clearance: 43.6 mL/min (by C-G formula based on SCr of 0.48 mg/dL). Potassium (mmol/L)  Date Value  04/25/2023 4.1  11/26/2013 3.8   Magnesium (mg/dL)  Date Value  13/03/6577 2.1   Calcium (mg/dL)  Date Value  46/96/2952 7.9 (L)   Calcium, Total (mg/dL)  Date Value  84/13/2440 9.5   Albumin (g/dL)  Date Value  06/19/2535 2.4 (L)  04/30/2015 4.3  11/26/2013 3.9   Phosphorus (mg/dL)  Date Value  64/40/3474 3.3   Sodium (mmol/L)  Date Value  04/25/2023 134 (L)  04/30/2015 140  11/26/2013 142   Assessment  Teresa Sherman is a 77 y.o. female presenting with Severe sepsis secondary to pneumonia . PMH significant for CVA, Anemia, and depression. Pharmacy has been consulted to monitor and replace electrolytes.  Goal of Therapy: Electrolytes WNL  Plan:  No electrolyte replacement indicated at this time Defer ordering of labs to primary team. Pharmacy will continue to follow along and replace where indicated  Thank you for involving pharmacy in this patient's care.   Teresa Sherman 04/25/2023 10:14 AM

## 2023-04-25 NOTE — Progress Notes (Signed)
SUBJECTIVE: Patient is complaining of pain in entire body but more alert today.   Vitals:   04/25/23 0801 04/25/23 0900 04/25/23 1000 04/25/23 1100  BP: 92/60 123/70 117/71 107/74  Pulse: 72     Resp: (!) 21 16 19  (!) 25  Temp:      TempSrc:      SpO2: 93% 99% 100% 98%  Weight:      Height:        Intake/Output Summary (Last 24 hours) at 04/25/2023 1157 Last data filed at 04/25/2023 0945 Gross per 24 hour  Intake 120 ml  Output 875 ml  Net -755 ml    LABS: Basic Metabolic Panel: Recent Labs    04/24/23 0406 04/25/23 0335  NA 136 134*  K 4.7 4.1  CL 107 105  CO2 22 23  GLUCOSE 99 83  BUN 23 29*  CREATININE 0.45 0.48  CALCIUM 8.0* 7.9*  MG 2.3 2.1  PHOS 2.2* 3.3   Liver Function Tests: Recent Labs    04/24/23 0406 04/25/23 0335  ALBUMIN 2.3* 2.4*   No results for input(s): "LIPASE", "AMYLASE" in the last 72 hours. CBC: Recent Labs    04/24/23 0406 04/25/23 0335  WBC 14.4* 11.2*  HGB 10.8* 11.0*  HCT 31.3* 32.1*  MCV 91.0 90.9  PLT 332 344   Cardiac Enzymes: No results for input(s): "CKTOTAL", "CKMB", "CKMBINDEX", "TROPONINI" in the last 72 hours. BNP: Invalid input(s): "POCBNP" D-Dimer: No results for input(s): "DDIMER" in the last 72 hours. Hemoglobin A1C: No results for input(s): "HGBA1C" in the last 72 hours. Fasting Lipid Panel: No results for input(s): "CHOL", "HDL", "LDLCALC", "TRIG", "CHOLHDL", "LDLDIRECT" in the last 72 hours. Thyroid Function Tests: No results for input(s): "TSH", "T4TOTAL", "T3FREE", "THYROIDAB" in the last 72 hours.  Invalid input(s): "FREET3" Anemia Panel: Recent Labs    04/25/23 0335  FERRITIN 169     PHYSICAL EXAM General: Well developed, well nourished, in no acute distress HEENT:  Normocephalic and atramatic Neck:  No JVD.  Lungs: Clear bilaterally to auscultation and percussion. Heart: HRRR . Normal S1 and S2 without gallops or murmurs.  Abdomen: Bowel sounds are positive, abdomen soft and non-tender   Msk:  Back normal, normal gait. Normal strength and tone for age. Extremities: No clubbing, cyanosis or edema.   Neuro: Alert and oriented X 3. Psych:  Good affect, responds appropriately  TELEMETRY: Sinus rhythm 98 bpm  ASSESSMENT AND PLAN: COVID-19 viral myocarditis with severe LV dysfunction left ventricular ejection fraction 30 to 35% with diffuse hypokinesis.  Patient was started on GDMT including Entresto Farxiga metoprolol succinate and Aldactone.  Will decrease the dose of Aldactone to 12.5 and decrease the dose of metoprolol to 12.5 mg once a day.  She was tolerating these medications yesterday but blood pressure has been lower today.  Sherryll Burger was already given since blood pressure recovered to 117 systolic.  Advise given medications by spreading them out so that blood pressure does not drop.   ICD-10-CM   1. Sepsis with acute hypoxic respiratory failure without septic shock, due to unspecified organism (HCC)  A41.9    R65.20    J96.01     2. COVID-19 virus infection  U07.1     3. Non-traumatic rhabdomyolysis  M62.82       Principal Problem:   Severe sepsis (HCC) Active Problems:   Depression with anxiety   History of stroke   CAP (community acquired pneumonia)   Pneumonia due to COVID-19 virus   Rhabdomyolysis  AKI (acute kidney injury) (HCC)   Acute metabolic encephalopathy   Urinary tract infection   Anemia   Acute on chronic respiratory failure with hypoxia [facility])    Adrian Blackwater, MD, [facility] 04/25/2023 11:57 AM

## 2023-04-25 NOTE — Evaluation (Signed)
Occupational Therapy Evaluation Patient Details Name: Teresa Sherman MRN: [id number] DOB: 09-Aug-1946 Today's Date: 04/25/2023   History of Present Illness Teresa Sherman is a 77 y.o. female with medical history significant for Prior stroke, depression with anxiety, prior ESBL E. coli, who presents to the emergency room by EMS with altered mental status.  She was found after wellness check was requested after she had not been seen in 2 days. Admitted to hospital wiith sepsis and COVID + with concerns for UTI.   Clinical Impression   [name] presents as highly agitated and confused today. She is aware that she is at "a hospital," can give the year as 2024 but is otherwise not oriented. She repeatedly states she is "ready to go home TODAY." When informed that she is too sick to go home at present, she responds, "that is because you are making me sick." She states she lives alone but is unable to provide any other details about her living situation or PLOF. Pt on 40 L HFNC, with O2 sats in upper 90s. She required Max A for rolling in bed, was unable to move into a seated position with Max support. Attempted UE and LE therex in supine, but pt able to participate only for a matter of seconds before becoming confused and/or too tired to continue. She endorses having pain "all over" and appears uncomfortable. Pt repositioned in bed for skin integrity and improved comfort, pt unable to follow any directions to participate in this effort. Given pt's high level of debility, recommend ongoing OT during hospitalization, if pt is able to participate meaningfully. Upon hospital discharge, anticipate that pt will require continued rehabilitation on a daily basis, <3 hrs/day.     If plan is discharge home, recommend the following: A lot of help with walking and/or transfers;A lot of help with bathing/dressing/bathroom;Supervision due to cognitive status;Help with stairs or ramp for entrance;Assistance with  feeding;Direct supervision/assist for medications management;Assistance with cooking/housework;Assist for transportation    Functional Status Assessment  Patient has had a recent decline in their functional status and demonstrates the ability to make significant improvements in function in a reasonable and predictable amount of time.  Equipment Recommendations  None recommended by OT    Recommendations for Other Services       Precautions / Restrictions Precautions Precautions: Fall Restrictions Weight Bearing Restrictions: No      Mobility Bed Mobility Overal bed mobility: Needs Assistance Bed Mobility: Rolling Rolling: Max assist         General bed mobility comments: Max A for rolling in bed, unable to come to sitting position Patient Response: Restless, Impulsive, Anxious  Transfers                   General transfer comment: unable      Balance Overall balance assessment: Needs assistance Sitting-balance support: No upper extremity supported, Feet supported Sitting balance-Leahy Scale: Poor Sitting balance - Comments: unable to come into full sitting posture     Standing balance-Leahy Scale: Zero                             ADL either performed or assessed with clinical judgement   ADL Overall ADL's : Needs assistance/impaired  General ADL Comments: Max A for all ADL at present     Vision         Perception         Praxis         Pertinent Vitals/Pain Pain Assessment Pain Assessment: Faces Faces Pain Scale: Hurts whole lot Pain Location: "all over" Pain Descriptors / Indicators: Moaning, Crying, Grimacing, Guarding Pain Intervention(s): Repositioned, Limited activity within patient's tolerance     Extremity/Trunk Assessment Upper Extremity Assessment Upper Extremity Assessment: Generalized weakness   Lower Extremity Assessment Lower Extremity Assessment:  Generalized weakness       Communication Communication Communication: Difficulty following commands/understanding   Cognition Arousal: Lethargic Behavior During Therapy: Agitated, Restless Overall Cognitive Status: Impaired/Different from baseline Area of Impairment: Orientation, Following commands, Safety/judgement                 Orientation Level: Disoriented to, Time, Situation, Place     Following Commands: Follows one step commands inconsistently       General Comments: agitated, unable to follow directions, AMS. Repeatedly states she is ready to go home now.     General Comments       Exercises Other Exercises Other Exercises: cognitive reorientation, limited UE therex in supine   Shoulder Instructions      Home Living Family/patient expects to be discharged to:: Private residence Living Arrangements: Alone Available Help at Discharge: Family;Available PRN/intermittently Type of Home: House Home Access: Stairs to enter [employer] of Steps: 1   Home Layout: One level     Bathroom Shower/Tub: Chief Strategy Officer: Standard         Additional Comments: pt unable to asnwer, home setup per chart      Prior Functioning/Environment Prior Level of Function : Patient poor historian/Family not available             Mobility Comments: Pt unable to state ADLs Comments: Pt unable to state        OT Problem List: Decreased strength;Decreased range of motion;Decreased activity tolerance;Impaired balance (sitting and/or standing);Decreased safety awareness;Decreased cognition;Impaired UE functional use      OT Treatment/Interventions: Self-care/ADL training;Therapeutic exercise;Energy conservation;DME and/or AE instruction;Therapeutic activities;Balance training;Patient/family education    OT Goals(Current goals can be found in the care plan section) Acute Rehab OT Goals Patient Stated Goal: to "go home today" OT Goal  Formulation: With patient Time For Goal Achievement: 05/09/23 Potential to Achieve Goals: Poor  OT Frequency: Min 1X/week    Co-evaluation              AM-PAC OT "6 Clicks" Daily Activity     Outcome Measure Help from another person eating meals?: A Lot Help from another person taking care of personal grooming?: A Lot Help from another person toileting, which includes using toliet, bedpan, or urinal?: A Lot Help from another person bathing (including washing, rinsing, drying)?: A Lot Help from another person to put on and taking off regular upper body clothing?: A Lot Help from another person to put on and taking off regular lower body clothing?: A Lot 6 Click Score: 12   End of Session Equipment Utilized During Treatment: Oxygen  Activity Tolerance: Treatment limited secondary to medical complications (Comment);Treatment limited secondary to agitation Patient left: in bed;with call bell/phone within reach;with bed alarm set  OT Visit Diagnosis: Unsteadiness on feet (R26.81);Muscle weakness (generalized) (M62.81)                Time: 1610-9604 OT Time Calculation (  min): 12 min Charges:  OT General Charges $OT Visit: 1 Visit OT Evaluation $OT Eval Moderate Complexity: 1 Mod Latina Craver, PhD, MS, OTR/L 04/25/23, 2:25 PM

## 2023-04-25 NOTE — Progress Notes (Signed)
Patient on 3 liters nasal Cannula, oxygenating 94%. Foley removed, pure-wick placed per MD. Patient had 2 BM during my shift.Generalized complaints of pain, patient refuses any pain medication. Continue to assess.

## 2023-04-26 DIAGNOSIS — R6520 Severe sepsis without septic shock: Secondary | ICD-10-CM | POA: Diagnosis not present

## 2023-04-26 DIAGNOSIS — A419 Sepsis, unspecified organism: Secondary | ICD-10-CM | POA: Diagnosis not present

## 2023-04-26 LAB — RENAL FUNCTION PANEL
Albumin: 2.5 g/dL — ABNORMAL LOW (ref 3.5–5.0)
Anion gap: 9 (ref 5–15)
BUN: 38 mg/dL — ABNORMAL HIGH (ref 8–23)
CO2: 21 mmol/L — ABNORMAL LOW (ref 22–32)
Calcium: 8.3 mg/dL — ABNORMAL LOW (ref 8.9–10.3)
Chloride: 104 mmol/L (ref 98–111)
Creatinine, Ser: 0.48 mg/dL (ref 0.44–1.00)
GFR, Estimated: >60 mL/min (ref >60)
Glucose, Bld: 108 mg/dL — ABNORMAL HIGH (ref 70–99)
Phosphorus: 3.5 mg/dL (ref 2.5–4.6)
Potassium: 4.2 mmol/L (ref 3.5–5.1)
Sodium: 134 mmol/L — ABNORMAL LOW (ref 135–145)

## 2023-04-26 LAB — CBC
HCT: 33.4 % — ABNORMAL LOW (ref 36.0–46.0)
Hemoglobin: 11.4 g/dL — ABNORMAL LOW (ref 12.0–15.0)
MCH: 31 pg (ref 26.0–34.0)
MCHC: 34.1 g/dL (ref 30.0–36.0)
MCV: 90.8 fL (ref 80.0–100.0)
Platelets: 383 10*3/uL (ref 150–400)
RBC: 3.68 MIL/uL — ABNORMAL LOW (ref 3.87–5.11)
RDW: 14.6 % (ref 11.5–15.5)
WBC: 14 10*3/uL — ABNORMAL HIGH (ref 4.0–10.5)
nRBC: 0 % (ref 0.0–0.2)

## 2023-04-26 LAB — BRAIN NATRIURETIC PEPTIDE: B Natriuretic Peptide: 158.5 pg/mL — ABNORMAL HIGH (ref 0.0–100.0)

## 2023-04-26 LAB — DIGOXIN LEVEL: Digoxin Level: 0.4 ng/mL — ABNORMAL LOW (ref 0.8–2.0)

## 2023-04-26 MED ORDER — HALOPERIDOL LACTATE 5 MG/ML IJ SOLN
1.0000 mg | Freq: Once | INTRAMUSCULAR | Status: AC
  Administered 2023-04-26: 1 mg via INTRAVENOUS
  Filled 2023-04-26: qty 1

## 2023-04-26 MED ORDER — METOPROLOL TARTRATE 5 MG/5ML IV SOLN
2.5000 mg | Freq: Once | INTRAVENOUS | Status: AC
  Administered 2023-04-26: 2.5 mg via INTRAVENOUS
  Filled 2023-04-26: qty 5

## 2023-04-26 MED ORDER — DIGOXIN 0.25 MG/ML IJ SOLN
0.1250 mg | INTRAMUSCULAR | Status: DC | PRN
  Administered 2023-04-26 – 2023-04-27 (×2): 0.125 mg via INTRAVENOUS
  Filled 2023-04-26 (×2): qty 2

## 2023-04-26 MED ORDER — SODIUM CHLORIDE 0.9 % IV BOLUS
250.0000 mL | Freq: Once | INTRAVENOUS | Status: AC
  Administered 2023-04-26: 250 mL via INTRAVENOUS

## 2023-04-26 MED ORDER — METOCLOPRAMIDE HCL 5 MG/ML IJ SOLN: 2.5000 mg | Freq: Once | INTRAMUSCULAR | Status: DC

## 2023-04-26 MED ORDER — METOPROLOL SUCCINATE ER 50 MG PO TB24: 25.0000 mg | ORAL_TABLET | Freq: Every day | ORAL | Status: DC

## 2023-04-26 MED ORDER — SODIUM CHLORIDE 0.9 % IV BOLUS
125.0000 mL | Freq: Once | INTRAVENOUS | Status: AC
  Administered 2023-04-26: 125 mL via INTRAVENOUS

## 2023-04-26 NOTE — Progress Notes (Signed)
[facility] CLINIC CARDIOLOGY PROGRESS NOTE   Patient ID: Teresa Sherman MRN: [id number] DOB/AGE: 1946/05/19 77 y.o.  Admit date: 04/11/2023 Referring Physician Dr. Joylene Igo Primary Physician Dr [name]  Primary Cardiologist none Reason for Consultation LV dysfunction  HPI: Teresa Sherman is a 77 y.o. female with a past medical history of CVA who presented to the ED on 04/11/2023 for SOB. She was seen by cardiology early in her admission for elevated troponin, NSTEMI in setting of covid infection. Cardiology reconsulted 8/30 for additional evaluation of severe LV dysfunction.  Interval History:  -Patient reports she is feeling well this AM, slightly confused at the time of evaluation.  -Denies any SOB, CP, palpitations. Supplemental oxygen currently being weaned.  -BP and HR remain stable with additions of GDMT.   Review of systems complete and found to be negative unless listed above    Vitals:   04/26/23 1100 04/26/23 1108 04/26/23 1200 04/26/23 1246  BP: 107/70 107/70 110/72   Pulse: (!) 110 (!) 105    Resp: (!) 25 19 (!) 26   Temp:      TempSrc:      SpO2: 90% 93%  95%  Weight:      Height:         Intake/Output Summary (Last 24 hours) at 04/26/2023 1247 Last data filed at 04/26/2023 1200 Gross per 24 hour  Intake 240 ml  Output 1195 ml  Net -955 ml     PHYSICAL EXAM General: Ill-appearing elderly female, well nourished, in no acute distress laying flat I hospital bed. HEENT: Normocephalic and atraumatic. Neck: No JVD.  Lungs: Normal respiratory effort on 3L . Clear bilaterally to auscultation. No wheezes, crackles, rhonchi.  Heart: HRRR. Normal S1 and S2 without gallops or murmurs. Radial & DP pulses 2+ bilaterally. Abdomen: Non-distended appearing.  Msk: Normal strength and tone for age. Extremities: No clubbing, cyanosis or edema.   Neuro: Alert and oriented X 3. Psych: Mood appropriate, affect congruent.    LABS: Basic Metabolic Panel: Recent Labs     04/24/23 0406 04/25/23 0335 04/26/23 0456  NA 136 134* 134*  K 4.7 4.1 4.2  CL 107 105 104  CO2 22 23 21*  GLUCOSE 99 83 108*  BUN 23 29* 38*  CREATININE 0.45 0.48 0.48  CALCIUM 8.0* 7.9* 8.3*  MG 2.3 2.1  --   PHOS 2.2* 3.3 3.5   Liver Function Tests: Recent Labs    04/25/23 0335 04/26/23 0456  ALBUMIN 2.4* 2.5*   No results for input(s): "LIPASE", "AMYLASE" in the last 72 hours. CBC: Recent Labs    04/25/23 0335 04/26/23 0456  WBC 11.2* 14.0*  HGB 11.0* 11.4*  HCT 32.1* 33.4*  MCV 90.9 90.8  PLT 344 383   Cardiac Enzymes: No results for input(s): "CKTOTAL", "CKMB", "CKMBINDEX", "TROPONINIHS" in the last 72 hours. BNP: Recent Labs    04/26/23 0456  BNP 158.5*   D-Dimer: No results for input(s): "DDIMER" in the last 72 hours. Hemoglobin A1C: No results for input(s): "HGBA1C" in the last 72 hours. Fasting Lipid Panel: No results for input(s): "CHOL", "HDL", "LDLCALC", "TRIG", "CHOLHDL", "LDLDIRECT" in the last 72 hours. Thyroid Function Tests: No results for input(s): "TSH", "T4TOTAL", "T3FREE", "THYROIDAB" in the last 72 hours.  Invalid input(s): "FREET3" Anemia Panel: Recent Labs    04/25/23 0335  FERRITIN 169    No results found.   ECHO 04/22/2023: 1. Left ventricular ejection fraction, by estimation, is 30 to 35%. Left ventricular ejection fraction by 3D  volume is 31 %. Left ventricular ejection fraction by PLAX is 34 %. The left ventricle has moderately decreased function. The left ventricle demonstrates global hypokinesis. Left ventricular diastolic parameters are consistent with Grade I diastolic dysfunction (impaired relaxation).   2. Right ventricular systolic function is mildly reduced. The right ventricular size is normal.   3. The mitral valve is normal in structure. Mild mitral valve regurgitation. No evidence of mitral stenosis.   4. The aortic valve has an indeterminant number of cusps. Aortic valve regurgitation is moderate. No  aortic stenosis is present.   5. The inferior vena cava is normal in size with greater than 50% respiratory variability, suggesting right atrial pressure of 3 mmHg.   TELEMETRY reviewed by me 04/26/23: NSR rate 90s  EKG reviewed by me 04/26/23: NSR rate 92 bpm, T wave inversions in aVL, V2  DATA reviewed by me 04/26/23: last 24h vitals tele labs imaging I/O, hospitalist progress notes, respiratory notes, nursing notes  Principal Problem:   Severe sepsis (HCC) Active Problems:   Depression with anxiety   History of stroke   CAP (community acquired pneumonia)   Pneumonia due to COVID-19 virus   Rhabdomyolysis   AKI (acute kidney injury) (HCC)   Acute metabolic encephalopathy   Urinary tract infection   Anemia   Acute on chronic respiratory failure with hypoxia (HCC)    ASSESSMENT AND PLAN: Teresa Sherman is a 77 y.o. female with a past medical history of CVA who presented to the ED on 04/11/2023 for SOB. She was seen by cardiology early in her admission for elevated troponin, NSTEMI in setting of covid infection. Cardiology reconsulted 8/30 for additional evaluation of severe LV dysfunction.  # Presumed covid-19 viral myocarditis # Acute heart failure reduced ejection fraction # NSTEMI Patient admitted with COVID-19 infection found to have NSTEMI on hospital day 5. Echo revealed severely reduced LV function, presumed viral myocarditis 2/2 covid infection.  -Increase metoprolol to 25 mg daily. Continue entresto 24-26 mg twice daily, spironolactone 12.5 mg twice daily, farxiga 10 mg daily.  # Acute DVT DVT found in LLE on [place] 04/22/2023.  -Continue eliquis 5 mg twice daily.   # Severe sepsis  # Acute hypoxic respiratory failure # Covid-19 pneumonia Had increased oxygen requirement on BiPAP earlier in admission now weaned to 3 L [place]. -Management per primary.   This patient's case was discussed and created with Dr. Juliann Pares and he is in agreement.  Signed:  Gale Journey,  PA-C  04/26/2023, 12:47 PM [facility]

## 2023-04-26 NOTE — Progress Notes (Signed)
Progress Note   Patient: Teresa Sherman ZOX:[id number] DOB: [birth year] DOA: 04/11/2023     15 DOS: the patient was seen and examined on 04/26/2023   Brief hospital course: Teresa Sherman is a 77 y.o. female with medical history significant for Prior stroke, depression with anxiety, prior ESBL E. coli, who presents to the emergency room by EMS with altered mental status.  She was found after wellness check was requested after she had not been seen in 2 days.  She was found somnolent and confused in her bed and had unintelligible mumbling but without focal neurologic deficits.  She is reportedly oriented x 3 at baseline.  She was also noted by EMS to have foul-smelling urine ED course and data review: Normotensive tachycardic to 115, tachypneic to 23 with O2 sat 90 to 95% on room air, BP 101/83 improving to 126/87 with IV fluids Labs with normal WBC of 8.1 but lactic acid 2.4 and procalcitonin 7.73.  Hemoglobin noted to be 7.8 with baseline around 13 a year ago.  CMP notable for creatinine of 1.78, up from 0.51 a year ago, AST/ALT 45/23.  COVID-positive.  CK14 38.G, EKG personally viewed and interpreted showing sinus tachycardia at 111 with no acute ST-T wave changes. Chest x-ray with possible right lower lobe airspace opacity as further detailed below: IMPRESSION: Right lower lung zone airspace opacity. Followup PA and lateral chest X-ray is recommended in 3-4 weeks following trial of antibiotic therapy to ensure resolution and exclude underlying malignancy.   Patient started on sepsis fluids and given Rocephin for suspected UTI Hospitalist consulted for admission.    As per Dr. Mayford Knife 8/21-8/27/24: Pt was found to have severe sepsis secondary to COVID19 pneumonia & possible co-bacterial infection. Completed IV rocephin, azithromycin x 5 days but then restarted on unasyn which was then changed to IV zosyn as pt has been moved into stepdown x 2 overnight this admission so far. Pt was  placed on BiPAP overnight (last night) and is currently on HFNC. Discussed code status w/ pt and she wants to continue full code.      04/21/23 : Patient seen and examined at the bedside.  Continues to have an increased oxygen requirement and is currently on HFNC  55L/90%.  She appears very weak. Called patient's daughter, [name] who wants her mother transferred to [facility] [place]. [facility] and discussed patient with intensivist who states that he has nothing different to offer her at this time and for [place] to call back if something changes in her condition.  Informed patient's daughter who is upset and is insistent that her mother be transferred because she is not satisfied with the care her mother is receiving here.  I have explained to her in detail that the patient can only be transferred due to medical necessity and not family request.  She requested I speak to her mother's primary care provider, Dr [name] ([phone]).  Called but not working today.   04/22/23 : Received call from [facility] about family request for patient transfer.  Discussed patient's condition and plan of care with intensivist over the phone.  Please see notes   04/23/23: Patient is seen and examined at the bedside.  She is more awake and alert.  Remains on high flow nasal cannula at 70% 55 L     04/24/23: Patient is seen and examined at the bedside.  She is down to high flow nasal cannula 50% 45 L.  PCCM signed off  04/25/23: Patient is seen and examined at the bedside. Noted to have pulse oximetry between 95 - 100% on 45% FIO2.  Will be switched to 15 liters bubble by RT       04/26/23 : On Room air.  Awake, alert and oriented to person and place.  Noted to be tachycardic on the monitor but denies any complaints.   Assessment and Plan:  Severe sepsis with acute hypoxic respiratory failure. met criteria w/ tachycardia and tachypnea with soft blood pressure, AKI, AMS, lactic acidosis with elevated  procalcitonin  Possibly secondary to COVID19 pneumonia with superimposed bacterial co-infection vs UTI.  Patient had an elevated Pro calcitonin levels > 4 Severe sepsis resolved and patient has completed antibiotic therapy. Rocephin + Zithromax/Unasyn/Zosyn Developed increased oxygen requirements on 08/23 and had been on increasing levels of HFNC as well as Bipap She has been weaned off oxygen and is currently on room air.       Acute metabolic encephalopathy Delirium History of mild cognitive deficit secondary to prior CVA on Aricept Probably related to COVID-19 infection as well as prolonged hospitalization with ICU stay Patient started on low-dose Seroquel Mental status continues to wax and wane.  Improved mental status and oriented to person, place and time       COVID19 pneumonia: w/ vs. possible co-bacterial infection.  Pro-cal was elevated at 4.12.  Continue on bronchodilators & encourage incentive spirometry.  CTA chest neg for PE but showed multifocal pneumonia Patient has completed a course of Zosyn, Rocephin/Zithromax Solu-Medrol has been discontinued and she remains on a prednisone taper      NSTEMI:  ??  Viral myocarditis Etiology unclear, possibly secondary to COVID19.  Troponin >3,000 but trending down.  Continue on statin, aspirin.  Was on heparin drip but was discontinued after 48 hours. Echo showed LVEF is severely depressed The proximal 1/4 of LV thickenes but the  rest of ventricle is severely hypokinetic / akinetiic Appearance of Takotsubo's cardiomyopathy. The left ventricle has severely decreased  function. Akinesis/dyskineiss of distal 2/3 of RV with aneurysmal dilitation in mid region.  Continue metoprolol, spironolactone and Entresto       Left lower extremity DVT Lower extremity ultrasound shows occlusive thrombus identified within the left peroneal vein as well as the left gastrocnemius vein. No evidence of deep venous thrombosis in the right lower  extremity. Continue Eliquis 10 mg p.o. twice daily for 7 days and 5 mg twice daily       Acute systolic CHF CXR showed asymmetric interstitial and airspace opacities in both lungs, similar to the prior study. 2D echocardiogram shows an LVEF of 30 to 35%.  Left ventricle demonstrates global hypokinesis.  LV diastolic parameters are consistent with grade 1 diastolic dysfunction. BNP was elevated at 875 Appreciate cardiology input, patient has been started on goal-directed medical therapy as much as blood pressure tolerates and is currently on Farxiga, digoxin, Entresto, spironolactone and metoprolol        R/o Dysphagia:  started back on regular diet 04/15/23 as per speech  Poor oral intake and refuses nutritional supplements Daughter states that nutritional supplements contain ingredients that trigger her migraine and has given a list of food which patient will usually eat.        Urinary retention: UTI:  Bladder scan showed 800 mL, Foley catheter inserted on 8/20. Will do a voiding trial prior to d/c Hx pseudomonas and ESBL E. coli UTI 11/2021. Urine cx shows no growth. Completed abx course  AKI :  Resolved     Rhabdomyolysis:  Unknown whether traumatic or not. CK WNL. Resolved.        Normocytic anemia:  H&H is stable No need for a transfusion currently      Hx of CVA:  continue on aspirin, statin      Depression severity unknown. Continue on home dose of trazodone    Constipation:  Discontinue Colace and MiraLAX due to loose stools Hold Aricept which can also contribute to diarrhea       Unstageable Pressure Injury; coccyx Appreciate wound care input Pressure Injury POA: Yes Measurement: 5cm x 6cm x 0cm  Wound bed:100% black/grey soft eschar  Drainage (amount, consistency, odor) scant, no odor Periwound: intact  Dressing procedure/placement/frequency: 1. Cleanse sacral wound with saline, pat dry 2. Apply 1/4" thick layer of Santyl to the wound bed,  top with saline moist gauze dressing.  3. Top with dry dressing, change daily Add low air loss mattress for moisture management and pressure redistribution Add RD consultation for wound supplementation      Physical deconditioning Secondary to prolonged hospitalization from acute illness PT and OT evaluation          Subjective: No new complaints.  Physical Exam: Vitals:   04/26/23 1108 04/26/23 1200 04/26/23 1246 04/26/23 1300  BP: 107/70 110/72  111/74  Pulse: (!) 105   (!) 108  Resp: 19 (!) 26  20  Temp:      TempSrc:      SpO2: 93%  95% 95%  Weight:      Height:       General: Acute on chronically ill-appearing frail elderly female, sitting in bed, on heated high flow nasal cannula, in no acute distress HENT: Atraumatic, normocephalic, neck supple, no JVD Lungs: Coarse breath sounds throughout, even, nonlabored, normal effort Cardiovascular: Regular rate and rhythm, tachycardic Abdomen: Soft, nontender, nondistended, no guarding or rebound tenderness, bowel sounds positive x 4 Extremities: Generalized weakness, no deformities, no edema Neuro: Awake and alert, oriented only to self, moves all extremities to commands, no focal deficits, flat affect GU: Foley catheter in place draining yellow urine   Data Reviewed: Labs reviewed.  White count 14.0, BNP 158, digoxin level 0.4 There are no new results to review at this time.  Family Communication: Called and discussed patient's condition and plan of care and hypertension, [name] over the phone.  All questions and concerns have been addressed.  She verbalizes understanding and agrees with the plan.  Will want her mother discharged to a skilled nursing facility and ambulance [place].  TOC informed  Disposition: Status is: Inpatient Remains inpatient appropriate because: Transfer out of stepdown to progressive unit   Planned Discharge Destination: Skilled nursing facility    Time spent: 34  minutes  Author: Lucile Shutters, MD 04/26/2023 2:55 PM  For on call review [email or web address].

## 2023-04-26 NOTE — Progress Notes (Signed)
PHARMACY CONSULT NOTE - ELECTROLYTES  Pharmacy Consult for Electrolyte Monitoring and Replacement   Recent Labs: Height: 5' (152.4 cm) Weight: 53.3 kg (117 lb 8.1 oz) IBW/kg (Calculated) : 45.5 Estimated Creatinine Clearance: 43.6 mL/min (by C-G formula based on SCr of 0.48 mg/dL). Potassium (mmol/L)  Date Value  04/26/2023 4.2  11/26/2013 3.8   Magnesium (mg/dL)  Date Value  09/81/1914 2.1   Calcium (mg/dL)  Date Value  78/29/5621 8.3 (L)   Calcium, Total (mg/dL)  Date Value  30/86/5784 9.5   Albumin (g/dL)  Date Value  69/62/9528 2.5 (L)  04/30/2015 4.3  11/26/2013 3.9   Phosphorus (mg/dL)  Date Value  41/32/4401 3.5   Sodium (mmol/L)  Date Value  04/26/2023 134 (L)  04/30/2015 140  11/26/2013 142   Assessment  Teresa Sherman is a 76 y.o. female presenting with Severe sepsis secondary to pneumonia . PMH significant for CVA, Anemia, and depression. Pharmacy has been consulted to monitor and replace electrolytes.  Goal of Therapy: Electrolytes WNL  Plan:  No electrolyte replacement indicated at this time Defer ordering of labs to primary team. Pharmacy will continue to follow along and replace where indicated  Thank you for involving pharmacy in this patient's care.   Lowella Bandy 04/26/2023 7:25 AM

## 2023-04-26 NOTE — Progress Notes (Signed)
Patient breathing in the high 30s, tossing, turning and pulling at bipap mask. Took bipap off and returned patient to room air.

## 2023-04-26 NOTE — Progress Notes (Signed)
Agbata, MD notified of sustained sinus tachycardia in the 130s, metoprolol IV given per order. Patient continues to have sustained sinus tachycardia in 130s and increasing agitation and restlessness, Agbata, MD notified. Haldol given per order.

## 2023-04-26 NOTE — Progress Notes (Addendum)
       CROSS COVER NOTE  NAME: Teresa Sherman MRN: [id number] DOB : 1945/11/08    Concern as stated by nurse / staff   bp is is 70/56 HR 120s she got haldol at 1830   1  11 mins MC hr 120s -130s      Pertinent findings on chart review: Last progress notes from MD, cardiologist and nursing reviewed along with prior vital signs.  Acute issues include presumed COVID viral myocarditis with acute heart failure and hypoxic, EF 25-30, and acute DVT essentially patient has been persistently tachycardic/sinus tach.  Got a dose of metoprolol IV.  Subsequently got a dose of Haldol IV for agitation.  BP was previously reasonable with GDMT on board for her systolic CHF    04/26/2023    8:39 PM 04/26/2023    7:00 PM 04/26/2023    6:30 PM  Vitals with BMI  Systolic 93 77   Diastolic 66 57   Pulse 125 118 128     Physical exam Physical Exam Vitals and nursing note reviewed.  Constitutional:      General: She is not in acute distress.    Comments: Frail-appearing elderly female, restless  HENT:     Head: Normocephalic and atraumatic.  Cardiovascular:     Rate and Rhythm: Regular rhythm. Tachycardia present.     Heart sounds: Normal heart sounds.  Pulmonary:     Effort: Tachypnea present.     Breath sounds: Normal breath sounds.  Abdominal:     Palpations: Abdomen is soft.     Tenderness: There is no abdominal tenderness.      Assessment and  Plan   Assessment:  -Sinus tachycardia -Acute systolic heart failure EF 30 to 35% with global hypokinesis -Hypotension, suspect related to medication, IV metoprolol and Haldol in addition to GDMT.  Per cardiology note, metoprolol was increased to 25 mg daily on 9/3.  She is also on Entresto 24 to 26 mg twice daily, spironolactone 12.5 twice daily  Plan: Hold BP meds for tonight Gentle 250 mL NS bolus Frequent BP checks with goal for MAP 60/65 Will give digoxin IV as needed for heart rate and if persistently tachycardic may consider  consulting cardiology for consideration of amiodarone If persistently hypotensive, not responding to holding meds and IV fluid bolus, will also consult for possible need for pressors/inotrope  Addendum: Patient continued to be hypotensive and tachycardic even after digoxin.  Repeated boluses given without significant change. Called and the cost Dr. Juliann Pares who agreed with amiodarone and started infusion... No bolus due to soft blood pressure PCCM consulted at 2:41 AM for assistance with management    CRITICAL CARE Performed by: Andris Baumann   Total critical care time: 120 minutes  Critical care time was exclusive of separately billable procedures and treating other patients.  Critical care was necessary to treat or prevent imminent or life-threatening deterioration.  Critical care was time spent personally by me on the following activities: development of treatment plan with patient and/or surrogate as well as nursing, discussions with consultants, evaluation of patient's response to treatment, examination of patient, obtaining history from patient or surrogate, ordering and performing treatments and interventions, ordering and review of laboratory studies, ordering and review of radiographic studies, pulse oximetry and re-evaluation of patient's condition.

## 2023-04-26 NOTE — TOC Progression Note (Signed)
Transition of Care [facility]) - Progression Note    Patient Details  Name: Teresa Sherman MRN: [id number] Date of Birth: 07/22/1946  [facility] CM/SW Contact  Kreg Shropshire, RN Phone Number: 04/26/2023, 3:09 PM  Clinical Narrative:    Cm received message from Dr. Lucile Shutters stating that pt family would like SNF to be in [facility]. Cm added for facilities in [facility] in bed search. Cm explained to Daughter Victorino Dike that authorization with insurance will be started closer to d/c date.        Expected Discharge Plan and Services                                               Social Determinants of Health (SDOH) Interventions SDOH Screenings   Food Insecurity: No Food Insecurity (04/13/2023)  Housing: Low Risk  (04/13/2023)  Transportation Needs: No Transportation Needs (04/13/2023)  Utilities: Not At Risk (04/13/2023)  Financial Resource Strain: Low Risk  (06/14/2021)   Received from [facility]  Physical Activity: Sufficiently Active (10/30/2021)   Received from [facility]  Social Connections: Socially Isolated (10/30/2021)   Received from [facility]  Stress: No Stress Concern Present (10/30/2021)   Received from [facility]  Tobacco Use: Low Risk  (04/11/2023)  Health Literacy: Medium Risk (10/30/2021)   Received from [facility]    Readmission Risk Interventions     No data to display

## 2023-04-27 ENCOUNTER — Inpatient Hospital Stay: Payer: Medicare HMO

## 2023-04-27 DIAGNOSIS — R6520 Severe sepsis without septic shock: Secondary | ICD-10-CM | POA: Diagnosis not present

## 2023-04-27 DIAGNOSIS — J9601 Acute respiratory failure with hypoxia: Secondary | ICD-10-CM

## 2023-04-27 DIAGNOSIS — A419 Sepsis, unspecified organism: Secondary | ICD-10-CM | POA: Diagnosis present

## 2023-04-27 LAB — BLOOD GAS, ARTERIAL
Acid-base deficit: 7.6 mmol/L — ABNORMAL HIGH (ref 0.0–2.0)
Acid-base deficit: 8.4 mmol/L — ABNORMAL HIGH (ref 0.0–2.0)
Bicarbonate: 15.7 mmol/L — ABNORMAL LOW (ref 20.0–28.0)
Bicarbonate: 17.2 mmol/L — ABNORMAL LOW (ref 20.0–28.0)
Delivery systems: POSITIVE
Expiratory PAP: 5 cmH2O
FIO2: 60 %
FIO2: 60 %
Inspiratory PAP: 10 cmH2O
MECHVT: 450 mL
Mechanical Rate: 26
O2 Saturation: 98.9 %
O2 Saturation: 98.5 %
PEEP: 5 cmH2O
Patient temperature: 37
Patient temperature: 37
pCO2 arterial: 26 mmHg — ABNORMAL LOW (ref 32–48)
pCO2 arterial: 35 mmHg (ref 32–48)
pH, Arterial: 7.39 (ref 7.35–7.45)
pH, Arterial: 7.3 — ABNORMAL LOW (ref 7.35–7.45)
pO2, Arterial: 97 mmHg (ref 83–108)
pO2, Arterial: 97 mmHg (ref 83–108)

## 2023-04-27 LAB — COMPREHENSIVE METABOLIC PANEL
ALT: 18 U/L (ref 0–44)
AST: 18 U/L (ref 15–41)
Albumin: 1.9 g/dL — ABNORMAL LOW (ref 3.5–5.0)
Alkaline Phosphatase: 40 U/L (ref 38–126)
Anion gap: 6 (ref 5–15)
BUN: 76 mg/dL — ABNORMAL HIGH (ref 8–23)
CO2: 19 mmol/L — ABNORMAL LOW (ref 22–32)
Calcium: 7.1 mg/dL — ABNORMAL LOW (ref 8.9–10.3)
Chloride: 112 mmol/L — ABNORMAL HIGH (ref 98–111)
Creatinine, Ser: 0.81 mg/dL (ref 0.44–1.00)
GFR, Estimated: >60 mL/min (ref >60)
Glucose, Bld: 290 mg/dL — ABNORMAL HIGH (ref 70–99)
Potassium: 4.7 mmol/L (ref 3.5–5.1)
Sodium: 137 mmol/L (ref 135–145)
Total Bilirubin: 0.8 mg/dL (ref 0.3–1.2)
Total Protein: 4 g/dL — ABNORMAL LOW (ref 6.5–8.1)

## 2023-04-27 LAB — CBC
HCT: 23.2 % — ABNORMAL LOW (ref 36.0–46.0)
HCT: 18.4 % — ABNORMAL LOW (ref 36.0–46.0)
Hemoglobin: 7.8 g/dL — ABNORMAL LOW (ref 12.0–15.0)
Hemoglobin: 6.1 g/dL — ABNORMAL LOW (ref 12.0–15.0)
MCH: 31.6 pg (ref 26.0–34.0)
MCH: 31.8 pg (ref 26.0–34.0)
MCHC: 33.6 g/dL (ref 30.0–36.0)
MCHC: 33.2 g/dL (ref 30.0–36.0)
MCV: 93.9 fL (ref 80.0–100.0)
MCV: 95.8 fL (ref 80.0–100.0)
Platelets: 356 10*3/uL (ref 150–400)
Platelets: 302 10*3/uL (ref 150–400)
RBC: 2.47 MIL/uL — ABNORMAL LOW (ref 3.87–5.11)
RBC: 1.92 MIL/uL — ABNORMAL LOW (ref 3.87–5.11)
RDW: 15.3 % (ref 11.5–15.5)
RDW: 15.8 % — ABNORMAL HIGH (ref 11.5–15.5)
WBC: 17.4 10*3/uL — ABNORMAL HIGH (ref 4.0–10.5)
WBC: 24.2 10*3/uL — ABNORMAL HIGH (ref 4.0–10.5)
nRBC: 0 % (ref 0.0–0.2)
nRBC: 0 % (ref 0.0–0.2)

## 2023-04-27 LAB — URINALYSIS, COMPLETE (UACMP) WITH MICROSCOPIC
Bilirubin Urine: NEGATIVE
Glucose, UA: >=500 mg/dL — AB
Ketones, ur: NEGATIVE mg/dL
Nitrite: NEGATIVE
Protein, ur: NEGATIVE mg/dL
Specific Gravity, Urine: 1.02 (ref 1.005–1.030)
Squamous Epithelial / HPF: NONE SEEN /HPF (ref 0–5)
WBC, UA: >50 WBC/hpf (ref 0–5)
pH: 5 (ref 5.0–8.0)

## 2023-04-27 LAB — GLUCOSE, CAPILLARY
Glucose-Capillary: 194 mg/dL — ABNORMAL HIGH (ref 70–99)
Glucose-Capillary: 133 mg/dL — ABNORMAL HIGH (ref 70–99)
Glucose-Capillary: 279 mg/dL — ABNORMAL HIGH (ref 70–99)
Glucose-Capillary: 263 mg/dL — ABNORMAL HIGH (ref 70–99)

## 2023-04-27 LAB — PHOSPHORUS: Phosphorus: 4.2 mg/dL (ref 2.5–4.6)

## 2023-04-27 LAB — PROTIME-INR
INR: 3.4 — ABNORMAL HIGH (ref 0.8–1.2)
Prothrombin Time: 34.3 s — ABNORMAL HIGH (ref 11.4–15.2)

## 2023-04-27 LAB — COOXEMETRY PANEL
Carboxyhemoglobin: 1.3 % (ref 0.5–1.5)
Methemoglobin: <0.7 % (ref 0.0–1.5)
O2 Saturation: 70.8 %
Total hemoglobin: <6.9 g/dL — CL (ref 12.0–16.0)
Total oxygen content: 69.7 %

## 2023-04-27 LAB — HEMOGLOBIN A1C
Hgb A1c MFr Bld: 5.9 % — ABNORMAL HIGH (ref 4.8–5.6)
Mean Plasma Glucose: 122.63 mg/dL

## 2023-04-27 LAB — MAGNESIUM
Magnesium: 2.2 mg/dL (ref 1.7–2.4)
Magnesium: 2 mg/dL (ref 1.7–2.4)

## 2023-04-27 LAB — LACTIC ACID, PLASMA
Lactic Acid, Venous: 4.3 mmol/L (ref 0.5–1.9)
Lactic Acid, Venous: 2.2 mmol/L (ref 0.5–1.9)
Lactic Acid, Venous: 2.6 mmol/L (ref 0.5–1.9)

## 2023-04-27 LAB — BASIC METABOLIC PANEL
Anion gap: 7 (ref 5–15)
BUN: 77 mg/dL — ABNORMAL HIGH (ref 8–23)
CO2: 18 mmol/L — ABNORMAL LOW (ref 22–32)
Calcium: 7.2 mg/dL — ABNORMAL LOW (ref 8.9–10.3)
Chloride: 115 mmol/L — ABNORMAL HIGH (ref 98–111)
Creatinine, Ser: 0.7 mg/dL (ref 0.44–1.00)
GFR, Estimated: >60 mL/min (ref >60)
Glucose, Bld: 178 mg/dL — ABNORMAL HIGH (ref 70–99)
Potassium: 4.5 mmol/L (ref 3.5–5.1)
Sodium: 140 mmol/L (ref 135–145)

## 2023-04-27 LAB — ABO/RH: ABO/RH(D): O POS

## 2023-04-27 LAB — PROCALCITONIN: Procalcitonin: <0.1 ng/mL

## 2023-04-27 LAB — PREPARE RBC (CROSSMATCH)

## 2023-04-27 MED ORDER — LACTATED RINGERS IV BOLUS
500.0000 mL | Freq: Once | INTRAVENOUS | Status: AC
  Administered 2023-04-27: 500 mL via INTRAVENOUS

## 2023-04-27 MED ORDER — SODIUM CHLORIDE 0.9 % IV SOLN
250.0000 mL | INTRAVENOUS | Status: DC
  Administered 2023-04-29: 250 mL via INTRAVENOUS

## 2023-04-27 MED ORDER — FENTANYL 2500MCG IN NS 250ML (10MCG/ML) PREMIX INFUSION
INTRAVENOUS | Status: AC
  Administered 2023-04-27: 25 ug/h
  Filled 2023-04-27: qty 250

## 2023-04-27 MED ORDER — VANCOMYCIN HCL 750 MG/150ML IV SOLN
750.0000 mg | INTRAVENOUS | Status: DC
  Administered 2023-04-28 – 2023-04-29 (×2): 750 mg via INTRAVENOUS
  Filled 2023-04-27 (×2): qty 150

## 2023-04-27 MED ORDER — AMIODARONE HCL IN DEXTROSE 360-4.14 MG/200ML-% IV SOLN: 30.0000 mg/h | INTRAVENOUS | Status: DC

## 2023-04-27 MED ORDER — KETAMINE HCL 50 MG/5ML IJ SOSY
2.0000 mg/kg | PREFILLED_SYRINGE | Freq: Once | INTRAMUSCULAR | Status: AC
  Administered 2023-04-27: 50 mg via INTRAVENOUS

## 2023-04-27 MED ORDER — FENTANYL CITRATE PF 50 MCG/ML IJ SOSY: 25.0000 ug | PREFILLED_SYRINGE | INTRAMUSCULAR | Status: DC | PRN

## 2023-04-27 MED ORDER — SODIUM CHLORIDE 0.9 % IV SOLN
1.0000 g | Freq: Two times a day (BID) | INTRAVENOUS | Status: DC
  Administered 2023-04-27 – 2023-04-28 (×4): 1 g via INTRAVENOUS
  Filled 2023-04-27 (×5): qty 20

## 2023-04-27 MED ORDER — MIDAZOLAM HCL 2 MG/2ML IJ SOLN: 2.0000 mg | Freq: Once | INTRAMUSCULAR | Status: AC

## 2023-04-27 MED ORDER — METHYLPREDNISOLONE SODIUM SUCC 40 MG IJ SOLR
10.0000 mg | Freq: Every day | INTRAMUSCULAR | Status: AC
  Administered 2023-05-01 – 2023-05-03 (×3): 10 mg via INTRAVENOUS
  Filled 2023-04-27 (×3): qty 1

## 2023-04-27 MED ORDER — DEXMEDETOMIDINE HCL IN NACL 400 MCG/100ML IV SOLN
INTRAVENOUS | Status: AC
  Administered 2023-04-27: 0.4 ug/kg/h via INTRAVENOUS
  Filled 2023-04-27: qty 100

## 2023-04-27 MED ORDER — DOCUSATE SODIUM 50 MG/5ML PO LIQD
100.0000 mg | Freq: Two times a day (BID) | ORAL | Status: DC
  Administered 2023-04-28 – 2023-04-29 (×4): 100 mg
  Filled 2023-04-27 (×5): qty 10

## 2023-04-27 MED ORDER — AMIODARONE LOAD VIA INFUSION
150.0000 mg | Freq: Once | INTRAVENOUS | Status: DC
  Filled 2023-04-27: qty 83.34

## 2023-04-27 MED ORDER — IPRATROPIUM-ALBUTEROL 0.5-2.5 (3) MG/3ML IN SOLN
3.0000 mL | RESPIRATORY_TRACT | Status: DC
  Administered 2023-04-27 – 2023-04-29 (×11): 3 mL via RESPIRATORY_TRACT
  Filled 2023-04-27 (×11): qty 3

## 2023-04-27 MED ORDER — SODIUM CHLORIDE 0.9 % IV SOLN: 250.0000 mL | INTRAVENOUS | Status: DC

## 2023-04-27 MED ORDER — LIDOCAINE HCL (PF) 2 % IJ SOLN
5.0000 mL | Freq: Once | INTRAMUSCULAR | Status: DC
  Filled 2023-04-27: qty 5

## 2023-04-27 MED ORDER — FAMOTIDINE IN NACL 20-0.9 MG/50ML-% IV SOLN
20.0000 mg | INTRAVENOUS | Status: DC
  Administered 2023-04-27 – 2023-04-30 (×4): 20 mg via INTRAVENOUS
  Filled 2023-04-27 (×4): qty 50

## 2023-04-27 MED ORDER — NOREPINEPHRINE 4 MG/250ML-% IV SOLN
2.0000 ug/min | INTRAVENOUS | Status: DC
  Administered 2023-04-27: 2 ug/min via INTRAVENOUS
  Administered 2023-04-27: 50 ug/min via INTRAVENOUS
  Filled 2023-04-27 (×2): qty 250

## 2023-04-27 MED ORDER — DEXMEDETOMIDINE HCL IN NACL 400 MCG/100ML IV SOLN: 0.0000 ug/kg/h | INTRAVENOUS | Status: DC

## 2023-04-27 MED ORDER — VASOPRESSIN 20 UNITS/100 ML INFUSION FOR SHOCK
0.0000 [IU]/min | INTRAVENOUS | Status: DC
  Administered 2023-04-27: 0.03 [IU]/min via INTRAVENOUS
  Administered 2023-04-28 (×2): 0.04 [IU]/min via INTRAVENOUS
  Filled 2023-04-27 (×3): qty 100

## 2023-04-27 MED ORDER — METOPROLOL TARTRATE 5 MG/5ML IV SOLN
INTRAVENOUS | Status: AC
  Filled 2023-04-27: qty 5

## 2023-04-27 MED ORDER — ALBUMIN HUMAN 25 % IV SOLN
25.0000 g | Freq: Four times a day (QID) | INTRAVENOUS | Status: AC
  Administered 2023-04-27 – 2023-04-28 (×2): 25 g via INTRAVENOUS
  Filled 2023-04-27 (×2): qty 100

## 2023-04-27 MED ORDER — POLYETHYLENE GLYCOL 3350 17 G PO PACK
17.0000 g | PACK | Freq: Every day | ORAL | Status: DC
  Administered 2023-04-28 – 2023-04-29 (×2): 17 g
  Filled 2023-04-27 (×3): qty 1

## 2023-04-27 MED ORDER — MIDAZOLAM HCL 2 MG/2ML IJ SOLN
INTRAMUSCULAR | Status: AC
  Administered 2023-04-27: 2 mg via INTRAVENOUS
  Filled 2023-04-27: qty 2

## 2023-04-27 MED ORDER — PHENYLEPHRINE HCL-NACL 20-0.9 MG/250ML-% IV SOLN
25.0000 ug/min | INTRAVENOUS | Status: DC
  Administered 2023-04-27: 205 ug/min via INTRAVENOUS
  Filled 2023-04-27: qty 250

## 2023-04-27 MED ORDER — ROCURONIUM BROMIDE 10 MG/ML (PF) SYRINGE
50.0000 mg | PREFILLED_SYRINGE | Freq: Once | INTRAVENOUS | Status: AC
  Administered 2023-04-27: 100 mg via INTRAVENOUS

## 2023-04-27 MED ORDER — METOPROLOL TARTRATE 5 MG/5ML IV SOLN: 2.5000 mg | Freq: Once | INTRAVENOUS | Status: DC

## 2023-04-27 MED ORDER — VANCOMYCIN HCL IN DEXTROSE 1-5 GM/200ML-% IV SOLN
1000.0000 mg | Freq: Once | INTRAVENOUS | Status: AC
  Administered 2023-04-27: 1000 mg via INTRAVENOUS
  Filled 2023-04-27: qty 200

## 2023-04-27 MED ORDER — MORPHINE SULFATE (PF) 2 MG/ML IV SOLN
2.0000 mg | Freq: Once | INTRAVENOUS | Status: AC
  Administered 2023-04-27: 2 mg via INTRAVENOUS
  Filled 2023-04-27: qty 1

## 2023-04-27 MED ORDER — AMIODARONE HCL IN DEXTROSE 360-4.14 MG/200ML-% IV SOLN
60.0000 mg/h | INTRAVENOUS | Status: DC
  Administered 2023-04-27: 60 mg/h via INTRAVENOUS

## 2023-04-27 MED ORDER — SODIUM CHLORIDE 0.9 % IV BOLUS
1000.0000 mL | Freq: Once | INTRAVENOUS | Status: AC
  Administered 2023-04-27: 1000 mL via INTRAVENOUS

## 2023-04-27 MED ORDER — PIPERACILLIN-TAZOBACTAM 3.375 G IVPB
3.3750 g | Freq: Three times a day (TID) | INTRAVENOUS | Status: DC
  Administered 2023-04-27: 3.375 g via INTRAVENOUS
  Filled 2023-04-27: qty 50

## 2023-04-27 MED ORDER — HEPARIN (PORCINE) 25000 UT/250ML-% IV SOLN: 900.0000 [IU]/h | INTRAVENOUS | Status: DC

## 2023-04-27 MED ORDER — AMIODARONE HCL IN DEXTROSE 360-4.14 MG/200ML-% IV SOLN: 60.0000 mg/h | INTRAVENOUS | Status: DC

## 2023-04-27 MED ORDER — PHENYLEPHRINE CONCENTRATED 100MG/250ML (0.4 MG/ML) INFUSION SIMPLE
0.0000 ug/min | INTRAVENOUS | Status: DC
  Filled 2023-04-27: qty 250

## 2023-04-27 MED ORDER — NOREPINEPHRINE 16 MG/250ML-% IV SOLN
0.0000 ug/min | INTRAVENOUS | Status: DC
  Administered 2023-04-27: 50 ug/min via INTRAVENOUS
  Administered 2023-04-28: 20 ug/min via INTRAVENOUS
  Filled 2023-04-27 (×2): qty 250

## 2023-04-27 MED ORDER — INSULIN ASPART 100 UNIT/ML IJ SOLN
0.0000 [IU] | INTRAMUSCULAR | Status: DC
  Administered 2023-04-27: 8 [IU] via SUBCUTANEOUS
  Administered 2023-04-27: 3 [IU] via SUBCUTANEOUS
  Administered 2023-04-27: 2 [IU] via SUBCUTANEOUS
  Administered 2023-04-27: 8 [IU] via SUBCUTANEOUS
  Administered 2023-04-28: 3 [IU] via SUBCUTANEOUS
  Administered 2023-04-29: 2 [IU] via SUBCUTANEOUS
  Administered 2023-04-30 – 2023-05-01 (×2): 3 [IU] via SUBCUTANEOUS
  Administered 2023-05-02 – 2023-05-06 (×7): 2 [IU] via SUBCUTANEOUS
  Filled 2023-04-27 (×13): qty 1

## 2023-04-27 MED ORDER — METHYLPREDNISOLONE SODIUM SUCC 40 MG IJ SOLR
20.0000 mg | Freq: Every day | INTRAMUSCULAR | Status: AC
  Administered 2023-04-28 – 2023-04-30 (×3): 20 mg via INTRAVENOUS
  Filled 2023-04-27 (×3): qty 1

## 2023-04-27 MED ORDER — MIDAZOLAM HCL 2 MG/2ML IJ SOLN
1.0000 mg | INTRAMUSCULAR | Status: DC | PRN
  Administered 2023-04-28: 2 mg via INTRAVENOUS
  Filled 2023-04-27: qty 2

## 2023-04-27 MED ORDER — DEXMEDETOMIDINE HCL IN NACL 400 MCG/100ML IV SOLN
0.0000 ug/kg/h | INTRAVENOUS | Status: DC
  Administered 2023-04-28: 0.2 ug/kg/h via INTRAVENOUS
  Filled 2023-04-27 (×2): qty 100

## 2023-04-27 MED ORDER — SODIUM CHLORIDE 0.9% IV SOLUTION: Freq: Once | INTRAVENOUS | Status: AC

## 2023-04-27 MED ORDER — FENTANYL CITRATE PF 50 MCG/ML IJ SOSY
25.0000 ug | PREFILLED_SYRINGE | INTRAMUSCULAR | Status: DC | PRN
  Administered 2023-04-28: 25 ug via INTRAVENOUS
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
## 2023-04-27 NOTE — Progress Notes (Signed)
PHARMACY CONSULT NOTE - ELECTROLYTES  Pharmacy Consult for Electrolyte Monitoring and Replacement   Recent Labs: Height: 5' (152.4 cm) Weight: 51 kg (112 lb 7 oz) IBW/kg (Calculated) : 45.5 Estimated Creatinine Clearance: 43.6 mL/min (by C-G formula based on SCr of 0.7 mg/dL). Potassium (mmol/L)  Date Value  04/27/2023 4.5  11/26/2013 3.8   Magnesium (mg/dL)  Date Value  16/05/9603 2.2   Calcium (mg/dL)  Date Value  54/04/8118 7.2 (L)   Calcium, Total (mg/dL)  Date Value  14/78/2956 9.5   Albumin (g/dL)  Date Value  21/30/8657 2.5 (L)  04/30/2015 4.3  11/26/2013 3.9   Phosphorus (mg/dL)  Date Value  84/69/6295 3.5   Sodium (mmol/L)  Date Value  04/27/2023 140  04/30/2015 140  11/26/2013 142   Assessment  Teresa Sherman is a 77 y.o. female presenting with Severe sepsis secondary to pneumonia . PMH significant for CVA, Anemia, and depression. Pharmacy has been consulted to monitor and replace electrolytes.  Goal of Therapy: Electrolytes WNL  Plan:  No electrolyte replacement indicated at this time Defer ordering of labs to primary team. Pharmacy will continue to follow along and replace where indicated  Thank you for involving pharmacy in this patient's care.   Lowella Bandy 04/27/2023 7:38 AM

## 2023-04-27 NOTE — Progress Notes (Signed)
Ouma NP aware of BP 79/63 (70) and HR in the 140s. As long as the Map is above 60 Neo does not need to be started.

## 2023-04-27 NOTE — Procedures (Signed)
Intubation Procedure Note  Teresa Sherman  [id number]  1945/12/15  Date:04/27/23  Time:8:57 PM   Provider Performing:Jean-Pierre Brooke Payes    Procedure: Intubation (31500)  Indication(s) Respiratory Failure  Consent Unable to obtain consent due to emergent nature of procedure.   Anesthesia Versed, Rocuronium, and Ketamine   Time Out Verified patient identification, verified procedure, site/side was marked, verified correct patient position, special equipment/implants available, medications/allergies/relevant history reviewed, required imaging and test results available.   Sterile Technique Usual hand hygeine, masks, and gloves were used   Procedure Description Patient positioned in bed supine.  Sedation given as noted above.  Patient was intubated with endotracheal tube using Glidescope.  View was Grade 1 full glottis .  Number of attempts was 1.  Colorimetric CO2 detector was consistent with tracheal placement.   Complications/Tolerance Patient became hypotensive post intubation expected post induction and HFrEF. Was able to recover quickly after increasing NE.   Chest X-ray is ordered to verify placement.

## 2023-04-27 NOTE — Final Progress Note (Signed)
1610 Reached out to Dr Para March regarding patient BP 77/57 and HR in sinus tach 120s-130s.  Dr Para March ordred a 250 N/S bolus. PRN digoxin placed for HR great than 125.  2128 Notified Dr Para March that PTS heart rate was better HR 110-120 and bp had improved to 97/73. Patient restless Bladder scan showed 500 mls of urine.   Dr Para March placed an order for an in and out.  Patient voided a small amount while nurse was placing in and out catheter. 850 Mls of urine drained from bladder.    2209  DR Para March notified HR is back up 125 to 130s BP is 66/58 (62).  Dr Para March placed an order for a 125 normal saline bolus 2303 paitents HR in the 130s Dose of digoxin given. 2307 Ask for BP goal for PT Dr Para March instructed nurse that a map greater then 60 was the goal for PT.   2331 Dr Para March notified that  Patient starting to become more restless HR 130s-135. Also notified that Patient is taking a break from bipap  0030 Dr Para March at Bedside to assess patient. bolus of normal saline ordered.  0207 Dr Para March notified her HR is back up 125 to 130s BP is 66/58 (62) I havnt given any PM meds. Dr Para March reached out to cardiology orders for an Amio drip started   0220 Patient began to desat on room arm to the the 70s patient placed on Bipap by RT and FIO2 increased from 40% to 60%  Dr. Para March made aware PCCM consulted.Patient O2 recovered after being placed on 60% bipap.   0300 Webb Silversmith NP at bedside instructed to give morphine for patients agitation  and a bolus.  OUMA NP instructed nurse to turn off Amio drip.  Orders were placed for lopresser and precedex nethier were started or given due to patients fllucating BP. Ouma NP aware of patients HR flucating from 130 to 50s.  Patients BP also fluctuating NP wants map of above 60.

## 2023-04-27 NOTE — Progress Notes (Signed)
Nutrition Follow-up  DOCUMENTATION CODES:   Not applicable  INTERVENTION:   Ice cream with meal trays.   MVI po daily   Vitamin C 500mg  po BID  Zinc 220mg  po daily x 14 days  Daily weights   NUTRITION DIAGNOSIS:   Increased nutrient needs related to acute illness (COVID-19) as evidenced by estimated needs. -ongoing  GOAL:   Patient will meet greater than or equal to 90% of their needs -progressing   MONITOR:   PO intake, Supplement acceptance, Labs, Weight trends, I & O's, Skin  ASSESSMENT:   77 y/o female with h/o anxiety, depression, stroke, dementia, spinal stenosis, HLD and GERD who is admitted with COVID 19, AKI, UTI, NSTEMI, DVT and sepsis.  Pt with improved appetite and oral intake; pt eating 50-80% of meals. Pt ate 75% of dinner last night but is noted to have increased agitation today. Refeed labs improved. Per chart, pt is down ~25lbs since admission and appears to be down ~7lbs(6%) from her UBW. RD will continue to monitor pt's oral intake in the setting of increased agitation. Pt non-compliant with supplements.   Medications reviewed and include: vitamin C, aspirin, insulin, melatonin, solu-medrol, MVI, zinc, pepcid, heparin, meropenem, levophed, vancomycin  Labs reviewed: K 4.5 wnl, BUN 77(H), P 3.5 wnl, Mg 2.2 wnl Wbc- 17.4(H), Hgb 7.8(L), Hct 23.2(L)  Diet Order:   Diet Order     None      EDUCATION NEEDS:   Education needs have been addressed  Skin:  Skin Assessment: Skin Integrity Issues: Skin Integrity Issues:: Unstageable DTI: coccyx Unstageable: rt coccyx  Last BM:  9/3- TYPE 6  Height:   Ht Readings from Last 1 Encounters:  04/19/23 5' (1.524 m)    Weight:   Wt Readings from Last 1 Encounters:  04/27/23 51 kg    Ideal Body Weight:  45.5 kg  BMI:  Body mass index is 21.96 kg/m.  Estimated Nutritional Needs:   Kcal:  1400-1600kcal/day  Protein:  70-80g/day  Fluid:  1.2-1.4L/day  Betsey Holiday MS, RD, LDN Please  refer to [facility] for RD and/or RD on-call/weekend/after hours pager

## 2023-04-27 NOTE — Progress Notes (Addendum)
PT Cancellation Note  Patient Details Name: Teresa Sherman MRN: [id number] DOB: [birth year]   Cancelled Treatment:    Reason Eval/Treat Not Completed: Medical issues which prohibited therapy MEWS of 6 this AM, spoke with nursing outside of her room and she is not appropriate for PT at this time.  Pt in respiratory distress - need BiPAP, tachycardic, continued low BP, delirium, etc.  Will hold at this time, and follow from a distance discussing with medical team on appropriateness for continued PT going forward.  Update 9/4 PM - pt to be intubated, per conversation with medical team will complete PT orders at this time.  Malachi Pro, DPT 04/27/2023, 11:43 AM

## 2023-04-27 NOTE — IPAL (Signed)
  Interdisciplinary Goals of Care Family Meeting   Date carried out: 04/27/2023  Location of the meeting: Phone conference  Member's involved: Nurse Practitioner and Family Member or next of kin  Durable Power of Attorney or acting medical decision maker: Pts daughter Caroline Sauger and pts brother Albertine Patricia pts POA     Discussion: We discussed goals of care for [employer] .  Discussed decline in pts condition due to worsening agitation/delirium; hypotension likely secondary to infectious source; and acute respiratory failure.  Informed pt is HIGH RISK FOR INTUBATION.  Discussed code status with Ms. Murrell Redden and Mr. Druscilla Brownie they both agreed with Full Code, and if deemed necessary they would like to proceed with mechanical intubation  Code status:   Code Status: Full Code   Disposition: Continue current acute care  Time spent for the meeting: 15 minutes     Zada Girt, AGNP  Pulmonary/Critical Care Pager [phone] (please enter 7 digits) PCCM Consult Pager [phone] (please enter 7 digits)

## 2023-04-27 NOTE — Consult Note (Signed)
ANTICOAGULATION CONSULT NOTE - Initial Consult  Pharmacy Consult for Heparin Infusion Indication: DVT  Allergies  Allergen Reactions   Duloxetine Hcl Nausea And Vomiting   Monosodium Glutamate     migraine   Nitrates, Organic     migraine   Sodium Benzoate [Nutritional Supplements]     migraine    Patient Measurements: Height: 5' (152.4 cm) Weight: 51 kg (112 lb 7 oz) IBW/kg (Calculated) : 45.5 Heparin Dosing Weight: 56.6 kg  Vital Signs: Temp: 98 F (36.7 C) (09/04 0800) Temp Source: Axillary (09/04 0800) BP: 95/69 (09/04 1230) Pulse Rate: 131 (09/04 1230)  Labs: Recent Labs    04/25/23 0335 04/26/23 0456 04/27/23 0626  HGB 11.0* 11.4* 7.8*  HCT 32.1* 33.4* 23.2*  PLT 344 383 356  CREATININE 0.48 0.48 0.70    Estimated Creatinine Clearance: 43.6 mL/min (by C-G formula based on SCr of 0.7 mg/dL).   Medical History: Past Medical History:  Diagnosis Date   Allergy    Anemia 04/11/2023   Anxiety    Cataract    Combined fat and carbohydrate induced hyperlipemia 01/26/2015   Depression    GERD (gastroesophageal reflux disease)     Medications:   Medications Prior to Admission  Medication Sig Dispense Refill Last Dose   acetaminophen (TYLENOL) 500 MG tablet Take 1,000 mg by mouth every 6 (six) hours as needed for fever or pain.   unk   aspirin 81 MG chewable tablet Chew 81 mg by mouth daily.   04/10/2023   aspirin-acetaminophen-caffeine (EXCEDRIN MIGRAINE) 250-250-65 MG tablet Take by mouth every 6 (six) hours as needed for headache.   unk   atorvastatin (LIPITOR) 40 MG tablet Take 40 mg by mouth daily.   Past Week   Azelastine HCl 137 MCG/SPRAY SOLN Place 1 spray into both nostrils at bedtime.   unk   Biotin 40981 MCG TBDP Take 1 tablet by mouth every morning.   Past Week   donepezil (ARICEPT) 10 MG tablet Take 10 mg by mouth at bedtime.   Past Week   guaiFENesin (ROBITUSSIN) 100 MG/5ML liquid Take 5 mLs by mouth every 4 (four) hours as needed for cough or to  loosen phlegm. 120 mL 0 unk   melatonin 1 MG TABS tablet Take 0.5 mg by mouth at bedtime.   Past Week   traZODone (DESYREL) 50 MG tablet Take 25 mg by mouth at bedtime as needed for sleep.   Past Week    Assessment: Teresa Sherman is a 77 y.o. female admitted 04/11/2023 for AMS, acute respiratory failure, acute metabolic encephalopathy in the setting of COVID-19. She was found to have LLE DVT on 04/22/23 and started on treatment. Due to patient's current high-risk status of tachycardia, hypotension requiring vasopressors, increased agitation/delirium and acute respiratory failure she has been switched from PO apixaban to IV heparin for treatment of DVT. Apixaban 10 mg last received at 0924 on 04/27/2023.  Baseline Labs: Hgb 7.8, Hct 23.2, Plt 356  Goal of Therapy:  Heparin level 0.3-0.7 units/ml aPTT 66-102 seconds Monitor platelets by anticoagulation protocol: Yes   Plan:  Start heparin infusion at 900 units/hr at 2100 on 04/27/2023 based on previous dosing during this admission.   Check aPTT level 8 hours after starting heparin infusion. Check heparin level for correlation at least once daily. We will use aPTT level for dosing until heparin and aPTT levels correlate. CBC at least once daily while on IV heparin.  Darolyn Rua, PharmD Student [facility] [employer]

## 2023-04-27 NOTE — Procedures (Signed)
Central Venous Catheter Insertion Procedure Note  Teresa Sherman  [id number]  1946/02/08  Date:04/27/23  Time:9:00 PM   Provider Performing:Jean-Pierre Joniel Graumann   Procedure: Insertion of Non-tunneled Central Venous Catheter(36556) with US guidance (40981)   Indication(s) Medication administration  Consent Unable to obtain consent due to emergent nature of procedure.  Anesthesia Topical only with 1% lidocaine   Timeout Verified patient identification, verified procedure, site/side was marked, verified correct patient position, special equipment/implants available, medications/allergies/relevant history reviewed, required imaging and test results available.  Sterile Technique Maximal sterile technique including full sterile barrier drape, hand hygiene, sterile gown, sterile gloves, mask, hair covering, sterile ultrasound probe cover (if used).  Procedure Description Area of catheter insertion was cleaned with chlorhexidine and draped in sterile fashion.  With real-time ultrasound guidance a central venous catheter was placed into the right internal jugular vein. Nonpulsatile blood flow and easy flushing noted in all ports.  The catheter was sutured in place and sterile dressing applied.  Complications/Tolerance None; patient tolerated the procedure well. Chest X-ray is ordered to verify placement for internal jugular or subclavian cannulation.   Chest x-ray is not ordered for femoral cannulation.  EBL Minimal  Specimen(s) None

## 2023-04-27 NOTE — Progress Notes (Cosign Needed Addendum)
[facility] CLINIC CARDIOLOGY PROGRESS NOTE   Patient ID: Teresa Sherman MRN: [id number] DOB/AGE: 1945-10-31 77 y.o.  Admit date: 04/11/2023 Referring Physician Dr. Joylene Igo Primary Physician Dr [name]  Primary Cardiologist none Reason for Consultation LV dysfunction  HPI: Teresa Sherman is a 77 y.o. female with a past medical history of CVA who presented to the ED on 04/11/2023 for SOB. She was seen by cardiology early in her admission for elevated troponin, NSTEMI in setting of covid infection. Cardiology reconsulted 8/30 for additional evaluation of severe LV dysfunction.  Interval History:  -Patient seen and examined this morning, sitting upright in hospital bed. -Patient confused on exam, mental status appears similar to yesterday. -Overnight had episodes of tachycardia to the 150s, now sustaining in the 120s on telemetry -Hypotensive so GDMT has been held, patient is net negative 8.1 L.  Review of systems complete and found to be negative unless listed above    Vitals:   04/27/23 1030 04/27/23 1040 04/27/23 1045 04/27/23 1100  BP: (!) 81/58  (!) 73/57 (!) 77/57  Pulse: (!) 111 (!) 116 (!) 115 (!) 117  Resp: (!) 25 (!) 25 (!) 28 (!) 30  Temp:      TempSrc:      SpO2: (!) 89% 90% 90% (!) 87%  Weight:      Height:         Intake/Output Summary (Last 24 hours) at 04/27/2023 1200 Last data filed at 04/27/2023 1100 Gross per 24 hour  Intake 298.4 ml  Output 1325 ml  Net -1026.6 ml     PHYSICAL EXAM General: Ill-appearing elderly female, well nourished, in no acute distress sitting upright in hospital bed. HEENT: Normocephalic and atraumatic. Neck: No JVD.  Lungs: Normal respiratory effort on room air. Clear bilaterally to auscultation. No wheezes, crackles, rhonchi.  Heart: fast rate regular rhythm. Normal S1 and S2 without gallops or murmurs. Radial & DP pulses 2+ bilaterally. Abdomen: Non-distended appearing.  Msk: Normal strength and tone for age. Extremities:  No clubbing, cyanosis or edema.   Neuro: Alert and oriented to person only Psych: Mood appropriate, affect congruent.    LABS: Basic Metabolic Panel: Recent Labs    04/25/23 0335 04/26/23 0456 04/27/23 0626  NA 134* 134* 140  K 4.1 4.2 4.5  CL 105 104 115*  CO2 23 21* 18*  GLUCOSE 83 108* 178*  BUN 29* 38* 77*  CREATININE 0.48 0.48 0.70  CALCIUM 7.9* 8.3* 7.2*  MG 2.1  --  2.2  PHOS 3.3 3.5  --    Liver Function Tests: Recent Labs    04/25/23 0335 04/26/23 0456  ALBUMIN 2.4* 2.5*   No results for input(s): "LIPASE", "AMYLASE" in the last 72 hours. CBC: Recent Labs    04/26/23 0456 04/27/23 0626  WBC 14.0* 17.4*  HGB 11.4* 7.8*  HCT 33.4* 23.2*  MCV 90.8 93.9  PLT 383 356   Cardiac Enzymes: No results for input(s): "CKTOTAL", "CKMB", "CKMBINDEX", "TROPONINIHS" in the last 72 hours. BNP: Recent Labs    04/26/23 0456  BNP 158.5*   D-Dimer: No results for input(s): "DDIMER" in the last 72 hours. Hemoglobin A1C: No results for input(s): "HGBA1C" in the last 72 hours. Fasting Lipid Panel: No results for input(s): "CHOL", "HDL", "LDLCALC", "TRIG", "CHOLHDL", "LDLDIRECT" in the last 72 hours. Thyroid Function Tests: No results for input(s): "TSH", "T4TOTAL", "T3FREE", "THYROIDAB" in the last 72 hours.  Invalid input(s): "FREET3" Anemia Panel: Recent Labs    04/25/23 0335  FERRITIN 169  DG Chest 1 View  Result Date: 04/27/2023 CLINICAL DATA:  Short S of breath EXAM: CHEST  1 VIEW COMPARISON:  04/21/2023 FINDINGS: Heart and mediastinal contours within normal limits. Improving bilateral airspace disease with persistent airspace opacity in the right upper lobe and minimal density at the left lung base. No effusions or acute bony abnormality. IMPRESSION: Improving bilateral airspace disease. Residual airspace opacity in the right upper lobe with minimal opacity at the left base. Electronically Signed   By: Charlett Nose M.D.   On: 04/27/2023 03:50     ECHO  04/22/2023: 1. Left ventricular ejection fraction, by estimation, is 30 to 35%. Left ventricular ejection fraction by 3D volume is 31 %. Left ventricular ejection fraction by PLAX is 34 %. The left ventricle has moderately decreased function. The left ventricle demonstrates global hypokinesis. Left ventricular diastolic parameters are consistent with Grade I diastolic dysfunction (impaired relaxation).   2. Right ventricular systolic function is mildly reduced. The right ventricular size is normal.   3. The mitral valve is normal in structure. Mild mitral valve regurgitation. No evidence of mitral stenosis.   4. The aortic valve has an indeterminant number of cusps. Aortic valve regurgitation is moderate. No aortic stenosis is present.   5. The inferior vena cava is normal in size with greater than 50% respiratory variability, suggesting right atrial pressure of 3 mmHg.   TELEMETRY reviewed by me 04/27/23: sinus tachycardia rate 120s, up to 150s overnight  EKG reviewed by me 04/27/23: NSR rate 92 bpm, T wave inversions in aVL, V2  DATA reviewed by me 04/27/23: last 24h vitals tele labs imaging I/O, hospitalist progress notes, respiratory notes, nursing notes  Principal Problem:   Severe sepsis (HCC) Active Problems:   Depression with anxiety   History of stroke   CAP (community acquired pneumonia)   Pneumonia due to COVID-19 virus   Rhabdomyolysis   AKI (acute kidney injury) (HCC)   Acute metabolic encephalopathy   Urinary tract infection   Anemia   Acute on chronic respiratory failure with hypoxia (HCC)    ASSESSMENT AND PLAN: Teresa Sherman is a 77 y.o. female with a past medical history of CVA who presented to the ED on 04/11/2023 for SOB. She was seen by cardiology early in her admission for elevated troponin, NSTEMI in setting of covid infection. Cardiology reconsulted 8/30 for additional evaluation of severe LV dysfunction.  # Presumed covid-19 viral myocarditis # Acute  heart failure reduced ejection fraction # NSTEMI Patient admitted with COVID-19 infection found to have NSTEMI on hospital day 5. Echo revealed severely reduced LV function, presumed viral myocarditis 2/2 covid infection. Was started on GDMT after BP was able to tolerate and doses titrated.  -GDMT currently held due to hypotension. Goal will be to restart as she improves and BP allows.  # Sinus tachycardia # ?Distributive shock Patient with rates elevated yesterday and overnight, up to 150s. Now in the 120s on telemetry. Hypotensive now started on neo. -Gentle IV hydration, she is net -8.1 L and given hypotension it is likely she was over-diuresed.  -Further workup pending.  # Acute DVT DVT found in LLE on [place] 04/22/2023.  -Eliquis switched to heparin per PCCM  # Severe sepsis  # Acute hypoxic respiratory failure # Covid-19 pneumonia Had increased oxygen requirement on BiPAP earlier in admission now weaned to room air. -Management per primary.   This patient's case was discussed and created with Dr. Juliann Pares and he is in agreement.  Signed:  Gale Journey, PA-C  04/27/2023, 12:00 PM [facility] Cardiology

## 2023-04-27 NOTE — Progress Notes (Addendum)
Pharmacy Antibiotic Note  Teresa Sherman is a 77 y.o. female admitted on 04/11/2023 with sepsis.  Pharmacy has been consulted for antibiotic dosing.  Plan:  1) Vancomycin load of 1,000 mg IV x1 then, 750 mg IV every 24 hours starting on 09/05 at 0600.  AUC: 502.8  If continued, check vancomycin level after 3rd or 4th dose.  2) Meropenem 1,000 mg q12h   PMH: CVA, depression, anxiety  Renal function stable  Height: 5' (152.4 cm) Weight: 51 kg (112 lb 7 oz) IBW/kg (Calculated) : 45.5  Temp (24hrs), Avg:97.8 F (36.6 C), Min:97.5 F (36.4 C), Max:98.2 F (36.8 C)  Recent Labs  Lab 04/23/23 0225 04/23/23 0600 04/24/23 0406 04/25/23 0335 04/26/23 0456 04/27/23 0626  WBC 12.9*  --  14.4* 11.2* 14.0* 17.4*  CREATININE  --  0.51 0.45 0.48 0.48 0.70    Estimated Creatinine Clearance: 43.6 mL/min (by C-G formula based on SCr of 0.7 mg/dL).    Allergies  Allergen Reactions   Duloxetine Hcl Nausea And Vomiting   Monosodium Glutamate     migraine   Nitrates, Organic     migraine   Sodium Benzoate [Nutritional Supplements]     migraine    Antimicrobials this admission: 9/4 Vancomycin >>  9/4 Meropenem >>\  9/4 Zosyn x1  8/26 Zosyn >> 8/31 8/24 Unasyn >> 8/26 8/19 Ceftriaxone >> 8/23 8/19 Azithromycin >> 8/23   Microbiology results: 9/4 BCx: pending 9/4 UCx: pending  9/4 Respiratory Cx: pending  8/19 Bcx: 1/4 Staphylococcus hominis 8/20 Ucx: NG 8/21 Bcx: NG 8/21 C diff: (-) 8/21 GIP: (-) 8/24 MRSA PCR: (-)  Thank you for allowing pharmacy to be a part of this patient's care.  Darolyn Rua, PharmD Student [facility] [employer]

## 2023-04-27 NOTE — Progress Notes (Deleted)
VOID

## 2023-04-28 ENCOUNTER — Inpatient Hospital Stay: Payer: Medicare HMO

## 2023-04-28 DIAGNOSIS — A419 Sepsis, unspecified organism: Secondary | ICD-10-CM | POA: Diagnosis not present

## 2023-04-28 DIAGNOSIS — R6520 Severe sepsis without septic shock: Secondary | ICD-10-CM | POA: Diagnosis not present

## 2023-04-28 LAB — BASIC METABOLIC PANEL
Anion gap: 8 (ref 5–15)
BUN: 68 mg/dL — ABNORMAL HIGH (ref 8–23)
CO2: 20 mmol/L — ABNORMAL LOW (ref 22–32)
Calcium: 7.6 mg/dL — ABNORMAL LOW (ref 8.9–10.3)
Chloride: 112 mmol/L — ABNORMAL HIGH (ref 98–111)
Creatinine, Ser: 0.84 mg/dL (ref 0.44–1.00)
GFR, Estimated: >60 mL/min (ref >60)
Glucose, Bld: 171 mg/dL — ABNORMAL HIGH (ref 70–99)
Potassium: 4 mmol/L (ref 3.5–5.1)
Sodium: 140 mmol/L (ref 135–145)

## 2023-04-28 LAB — CBC WITH DIFFERENTIAL/PLATELET
Abs Immature Granulocytes: 0.2 10*3/uL — ABNORMAL HIGH (ref 0.00–0.07)
Basophils Absolute: 0 10*3/uL (ref 0.0–0.1)
Basophils Relative: 0 %
Eosinophils Absolute: 0 10*3/uL (ref 0.0–0.5)
Eosinophils Relative: 0 %
HCT: 24 % — ABNORMAL LOW (ref 36.0–46.0)
Hemoglobin: 8.3 g/dL — ABNORMAL LOW (ref 12.0–15.0)
Immature Granulocytes: 1 %
Lymphocytes Relative: 9 %
Lymphs Abs: 1.6 10*3/uL (ref 0.7–4.0)
MCH: 32.2 pg (ref 26.0–34.0)
MCHC: 34.6 g/dL (ref 30.0–36.0)
MCV: 93 fL (ref 80.0–100.0)
Monocytes Absolute: 0.8 10*3/uL (ref 0.1–1.0)
Monocytes Relative: 5 %
Neutro Abs: 14.8 10*3/uL — ABNORMAL HIGH (ref 1.7–7.7)
Neutrophils Relative %: 85 %
Platelets: 231 10*3/uL (ref 150–400)
RBC: 2.58 MIL/uL — ABNORMAL LOW (ref 3.87–5.11)
RDW: 14.6 % (ref 11.5–15.5)
WBC: 17.4 10*3/uL — ABNORMAL HIGH (ref 4.0–10.5)
nRBC: 0 % (ref 0.0–0.2)

## 2023-04-28 LAB — CBC
HCT: 19.5 % — ABNORMAL LOW (ref 36.0–46.0)
Hemoglobin: 6.7 g/dL — ABNORMAL LOW (ref 12.0–15.0)
MCH: 32.4 pg (ref 26.0–34.0)
MCHC: 34.4 g/dL (ref 30.0–36.0)
MCV: 94.2 fL (ref 80.0–100.0)
Platelets: 160 10*3/uL (ref 150–400)
RBC: 2.07 MIL/uL — ABNORMAL LOW (ref 3.87–5.11)
RDW: 14.6 % (ref 11.5–15.5)
WBC: 13.6 10*3/uL — ABNORMAL HIGH (ref 4.0–10.5)
nRBC: 0.1 % (ref 0.0–0.2)

## 2023-04-28 LAB — BLOOD GAS, ARTERIAL
Acid-base deficit: 3.2 mmol/L — ABNORMAL HIGH (ref 0.0–2.0)
Bicarbonate: 19.5 mmol/L — ABNORMAL LOW (ref 20.0–28.0)
FIO2: 40 %
MECHVT: 450 mL
Mechanical Rate: 22
O2 Saturation: 99.9 %
PEEP: 5 cmH2O
Patient temperature: 37
pCO2 arterial: 28 mmHg — ABNORMAL LOW (ref 32–48)
pH, Arterial: 7.45 (ref 7.35–7.45)
pO2, Arterial: 108 mmHg (ref 83–108)

## 2023-04-28 LAB — BLOOD GAS, VENOUS
Acid-base deficit: 5.8 mmol/L — ABNORMAL HIGH (ref 0.0–2.0)
Bicarbonate: 18.5 mmol/L — ABNORMAL LOW (ref 20.0–28.0)
O2 Saturation: 77.4 %
Patient temperature: 37
pCO2, Ven: 32 mmHg — ABNORMAL LOW (ref 44–60)
pH, Ven: 7.37 (ref 7.25–7.43)
pO2, Ven: 40 mmHg (ref 32–45)

## 2023-04-28 LAB — GLUCOSE, CAPILLARY
Glucose-Capillary: 156 mg/dL — ABNORMAL HIGH (ref 70–99)
Glucose-Capillary: 92 mg/dL (ref 70–99)
Glucose-Capillary: 108 mg/dL — ABNORMAL HIGH (ref 70–99)
Glucose-Capillary: 117 mg/dL — ABNORMAL HIGH (ref 70–99)
Glucose-Capillary: 109 mg/dL — ABNORMAL HIGH (ref 70–99)
Glucose-Capillary: 113 mg/dL — ABNORMAL HIGH (ref 70–99)

## 2023-04-28 LAB — LACTIC ACID, PLASMA
Lactic Acid, Venous: 2.3 mmol/L (ref 0.5–1.9)
Lactic Acid, Venous: 1 mmol/L (ref 0.5–1.9)

## 2023-04-28 LAB — FUNGITELL BETA-D-GLUCAN
Fungitell Value:: <31.25 pg/mL
Result Name:: NEGATIVE

## 2023-04-28 LAB — PROCALCITONIN: Procalcitonin: 0.51 ng/mL

## 2023-04-28 LAB — PHOSPHORUS: Phosphorus: 3.6 mg/dL (ref 2.5–4.6)

## 2023-04-28 LAB — PREPARE RBC (CROSSMATCH)

## 2023-04-28 LAB — HEPARIN LEVEL (UNFRACTIONATED): Heparin Unfractionated: >1.1 [IU]/mL — ABNORMAL HIGH (ref 0.30–0.70)

## 2023-04-28 LAB — PROTIME-INR
INR: 2.6 — ABNORMAL HIGH (ref 0.8–1.2)
Prothrombin Time: 27.8 s — ABNORMAL HIGH (ref 11.4–15.2)

## 2023-04-28 LAB — HEMOGLOBIN AND HEMATOCRIT, BLOOD
HCT: 24.5 % — ABNORMAL LOW (ref 36.0–46.0)
Hemoglobin: 8.5 g/dL — ABNORMAL LOW (ref 12.0–15.0)

## 2023-04-28 LAB — MAGNESIUM: Magnesium: 2.1 mg/dL (ref 1.7–2.4)

## 2023-04-28 MED ORDER — ACETAMINOPHEN 650 MG RE SUPP: 650.0000 mg | Freq: Four times a day (QID) | RECTAL | Status: DC | PRN

## 2023-04-28 MED ORDER — ORAL CARE MOUTH RINSE
15.0000 mL | OROMUCOSAL | Status: DC
  Administered 2023-04-28 – 2023-04-29 (×17): 15 mL via OROMUCOSAL

## 2023-04-28 MED ORDER — MELATONIN 5 MG PO TABS
5.0000 mg | ORAL_TABLET | Freq: Every day | ORAL | Status: DC
  Administered 2023-04-28 – 2023-04-29 (×2): 5 mg
  Filled 2023-04-28 (×2): qty 1

## 2023-04-28 MED ORDER — ADULT MULTIVITAMIN W/MINERALS CH
1.0000 | ORAL_TABLET | Freq: Every day | ORAL | Status: DC
  Administered 2023-04-28 – 2023-04-29 (×2): 1
  Filled 2023-04-28 (×2): qty 1

## 2023-04-28 MED ORDER — ZINC SULFATE 220 (50 ZN) MG PO CAPS
220.0000 mg | ORAL_CAPSULE | Freq: Every day | ORAL | Status: DC
  Administered 2023-04-29: 220 mg
  Filled 2023-04-28 (×2): qty 1

## 2023-04-28 MED ORDER — GUAIFENESIN 100 MG/5ML PO LIQD
15.0000 mL | Freq: Three times a day (TID) | ORAL | Status: DC
  Administered 2023-04-28 – 2023-04-29 (×4): 15 mL
  Filled 2023-04-28 (×5): qty 20

## 2023-04-28 MED ORDER — FREE WATER
30.0000 mL | Status: DC
  Administered 2023-04-28 – 2023-04-29 (×3): 30 mL

## 2023-04-28 MED ORDER — VITAL AF 1.2 CAL PO LIQD
1000.0000 mL | ORAL | Status: DC
  Administered 2023-04-28: 1000 mL

## 2023-04-28 MED ORDER — ACETAMINOPHEN 325 MG PO TABS
650.0000 mg | ORAL_TABLET | Freq: Four times a day (QID) | ORAL | Status: DC | PRN
  Administered 2023-04-29: 650 mg
  Filled 2023-04-28: qty 2

## 2023-04-28 MED ORDER — ONDANSETRON HCL 4 MG/2ML IJ SOLN: 4.0000 mg | Freq: Four times a day (QID) | INTRAMUSCULAR | Status: DC | PRN

## 2023-04-28 MED ORDER — ROSUVASTATIN CALCIUM 10 MG PO TABS
40.0000 mg | ORAL_TABLET | Freq: Every day | ORAL | Status: DC
  Administered 2023-04-28 – 2023-04-29 (×2): 40 mg
  Filled 2023-04-28 (×2): qty 4

## 2023-04-28 MED ORDER — IOHEXOL 9 MG/ML PO SOLN
500.0000 mL | ORAL | Status: AC
  Administered 2023-04-28 (×2): 500 mL via ORAL

## 2023-04-28 MED ORDER — POLYETHYLENE GLYCOL 3350 17 G PO PACK: 17.0000 g | PACK | Freq: Every day | ORAL | Status: DC | PRN

## 2023-04-28 MED ORDER — JUVEN PO PACK
1.0000 | PACK | Freq: Two times a day (BID) | ORAL | Status: DC
  Administered 2023-04-29: 1

## 2023-04-28 MED ORDER — HEPARIN SODIUM (PORCINE) 5000 UNIT/ML IJ SOLN
5000.0000 [IU] | Freq: Three times a day (TID) | INTRAMUSCULAR | Status: DC
  Administered 2023-04-28 (×2): 5000 [IU] via SUBCUTANEOUS
  Filled 2023-04-28 (×2): qty 1

## 2023-04-28 MED ORDER — VITAMIN C 500 MG PO TABS
500.0000 mg | ORAL_TABLET | Freq: Two times a day (BID) | ORAL | Status: DC
  Administered 2023-04-28 – 2023-04-29 (×3): 500 mg
  Filled 2023-04-28 (×4): qty 1

## 2023-04-28 MED ORDER — SODIUM CHLORIDE 0.9% IV SOLUTION: Freq: Once | INTRAVENOUS | Status: AC

## 2023-04-28 MED ORDER — ONDANSETRON HCL 4 MG PO TABS: 4.0000 mg | ORAL_TABLET | Freq: Four times a day (QID) | ORAL | Status: DC | PRN

## 2023-04-28 MED ORDER — ORAL CARE MOUTH RINSE: 15.0000 mL | OROMUCOSAL | Status: DC | PRN

## 2023-04-28 NOTE — TOC Progression Note (Signed)
Transition of Care [facility]) - Progression Note    Patient Details  Name: Teresa Sherman MRN: [id number] Date of Birth: 05-01-1946  [facility]) CM/SW Contact  Kreg Shropshire, RN Phone Number: 04/28/2023, 10:56 AM  Clinical Narrative:     Cm continue to follow for toc needs. Pt currently intubated.        Expected Discharge Plan and Services                                               Social Determinants of Health (SDOH) Interventions SDOH Screenings   Food Insecurity: No Food Insecurity (04/13/2023)  Housing: Low Risk  (04/13/2023)  Transportation Needs: No Transportation Needs (04/13/2023)  Utilities: Not At Risk (04/13/2023)  Financial Resource Strain: Low Risk  (06/14/2021)   [facility]  Physical Activity: Sufficiently Active (10/30/2021)   Received from [facility]  Social Connections: Socially Isolated (10/30/2021)   Received from [facility]  Stress: No Stress Concern Present (10/30/2021)   Received from [facility]  Tobacco Use: Low Risk  (04/11/2023)  Health Literacy: Medium Risk (10/30/2021)   Received from [facility]    Readmission Risk Interventions     No data to display

## 2023-04-28 NOTE — Progress Notes (Signed)
Nutrition Follow-up  DOCUMENTATION CODES:   Not applicable  INTERVENTION:   Vital 1.2@50ml /hr- Initiate at 37ml/hr and increased by 13ml/hr q 8 hours until goal rate is reached.   Free water flushes 30ml q4 hours to maintain tube patency   Regimen provides 1440kcal/day, 90g/day protein and 1113ml/day of free water.   Pt at high refeed risk; recommend monitor potassium, magnesium and phosphorus labs daily until stable  Juven Fruit Punch BID via tube, each serving provides 95kcal and 2.5g of protein (amino acids glutamine and arginine)  Vitamin C 500mg  BID via tube  Zinc 220mg  po daily via tube x 14 days  Daily weights   NUTRITION DIAGNOSIS:   Increased nutrient needs related to acute illness (COVID-19) as evidenced by estimated needs. -ongoing  GOAL:   Patient will meet greater than or equal to 90% of their needs -progressing   MONITOR:   Vent status, Labs, Weight trends, TF tolerance, Skin, I & O's  ASSESSMENT:   77 y/o female with h/o anxiety, depression, stroke, dementia, spinal stenosis, HLD and GERD who is admitted with COVID 19, AKI, UTI, NSTEMI, DVT and sepsis.  Pt now intubated. OGT in place. Will plan to initiate tube feeds today. Pt is at high refeed risk.   Medications reviewed and include: vitamin C, colace, heparin, insulin, melatonin, solu-medrol, MVI, miralax, zinc, pepcid, meropenem, levophed, vancomycin, vasopressin   Labs reviewed: K 4.0 wnl, BUN 68(H), P 3.6 wnl, Mg 2.1 wnl Wbc- 17.4(H), Hgb 8.3(L), Hct 24.0(L) Cbgs- 92, 156 x 24 hrs   Patient is currently intubated on ventilator support MV: 13.1 L/min Temp (24hrs), Avg:97.8 F (36.6 C), Min:97.2 F (36.2 C), Max:98.4 F (36.9 C)  Propofol: none   MAP- >17mmHg   UOP-   Diet Order:   Diet Order             Diet NPO time specified  Diet effective now                  EDUCATION NEEDS:   Education needs have been addressed  Skin:  Skin Assessment: Skin Integrity  Issues: Skin Integrity Issues:: Unstageable DTI: coccyx Unstageable: rt coccyx  Last BM:  9/3- TYPE 6  Height:   Ht Readings from Last 1 Encounters:  04/19/23 5' (1.524 m)    Weight:   Wt Readings from Last 1 Encounters:  04/28/23 53.9 kg    Ideal Body Weight:  45.5 kg  BMI:  Body mass index is 23.21 kg/m.  Estimated Nutritional Needs:   Kcal:  1279kcal/day  Protein:  80-90g/day  Fluid:  1.2-1.4L/day  Betsey Holiday MS, RD, LDN Please refer to [facility] for RD and/or RD on-call/weekend/after hours pager

## 2023-04-28 NOTE — Progress Notes (Signed)
[facility] CLINIC CARDIOLOGY PROGRESS NOTE   Patient ID: Teresa Sherman MRN: [id number] DOB/AGE: 01/08/1946 77 y.o.  Admit date: 04/11/2023 Referring Physician Dr. Joylene Igo Primary Physician Dr [name]  Primary Cardiologist none Reason for Consultation LV dysfunction  HPI: Teresa Sherman is a 77 y.o. female with a past medical history of CVA who presented to the ED on 04/11/2023 for SOB. She was seen by cardiology early in her admission for elevated troponin, NSTEMI in setting of covid infection. Cardiology reconsulted 8/30 for additional evaluation of severe LV dysfunction.  Interval History:  -Respiratory status declined overnight. Patient now intubated, sedated.  -HR much improved in the 60-70s.  -BP improved on vasopressors, weaning doses as able.   Review of systems complete and found to be negative unless listed above    Vitals:   04/28/23 1000 04/28/23 1100 04/28/23 1124 04/28/23 1200  BP: 108/61 (!) 116/57  (!) 114/57  Pulse: 94 94  76  Resp: (!) 24 (!) 25  (!) 22  Temp:    (!) 97.1 F (36.2 C)  TempSrc:    Axillary  SpO2: 100% 99% 99% 99%  Weight:      Height:         Intake/Output Summary (Last 24 hours) at 04/28/2023 1352 Last data filed at 04/28/2023 1200 Gross per 24 hour  Intake 3326.69 ml  Output 1035 ml  Net 2291.69 ml     PHYSICAL EXAM General: Ill-appearing elderly female, in no acute distress. Intubated & sedated. HEENT: Normocephalic and atraumatic. Neck: No JVD.  Lungs: Intubated on 30% FiO2. Coarse breath sounds bilaterally.  Heart: fast rate regular rhythm. Normal S1 and S2 without gallops or murmurs. Radial & DP pulses 2+ bilaterally. Abdomen: Non-distended appearing.  Msk: Normal strength and tone for age. Extremities: No clubbing, cyanosis or edema.  Warm & well perfused. Neuro: Alert and oriented to person only Psych: Mood appropriate, affect congruent.    LABS: Basic Metabolic Panel: Recent Labs    04/27/23 2011 04/28/23 0329   NA 137 140  K 4.7 4.0  CL 112* 112*  CO2 19* 20*  GLUCOSE 290* 171*  BUN 76* 68*  CREATININE 0.81 0.84  CALCIUM 7.1* 7.6*  MG 2.0 2.1  PHOS 4.2 3.6   Liver Function Tests: Recent Labs    04/26/23 0456 04/27/23 2011  AST  --  18  ALT  --  18  ALKPHOS  --  40  BILITOT  --  0.8  PROT  --  4.0*  ALBUMIN 2.5* 1.9*   No results for input(s): "LIPASE", "AMYLASE" in the last 72 hours. CBC: Recent Labs    04/28/23 0329 04/28/23 1335  WBC 17.4* 13.6*  NEUTROABS 14.8*  --   HGB 8.3* 6.7*  HCT 24.0* 19.5*  MCV 93.0 94.2  PLT 231 160   Cardiac Enzymes: No results for input(s): "CKTOTAL", "CKMB", "CKMBINDEX", "TROPONINIHS" in the last 72 hours. BNP: Recent Labs    04/26/23 0456  BNP 158.5*   D-Dimer: No results for input(s): "DDIMER" in the last 72 hours. Hemoglobin A1C: Recent Labs    04/27/23 1139  HGBA1C 5.9*   Fasting Lipid Panel: No results for input(s): "CHOL", "HDL", "LDLCALC", "TRIG", "CHOLHDL", "LDLDIRECT" in the last 72 hours. Thyroid Function Tests: No results for input(s): "TSH", "T4TOTAL", "T3FREE", "THYROIDAB" in the last 72 hours.  Invalid input(s): "FREET3" Anemia Panel: No results for input(s): "VITAMINB12", "FOLATE", "FERRITIN", "TIBC", "IRON", "RETICCTPCT" in the last 72 hours.   DG Chest [facility] 1 [address]  Result Date: 04/28/2023 CLINICAL DATA:  Hypoxia, repositioned ET tube EXAM: PORTABLE CHEST 1 VIEW COMPARISON:  04/27/2023 FINDINGS: Endotracheal tube was retracted, now 3 cm above the carina. Right central line and NG tube are unchanged. Heart and mediastinal contours are within normal limits. Improved aeration in the right upper lobe. Residual left lower lobe atelectasis or infiltrate. IMPRESSION: Interval repositioning of the endotracheal tube in expected position. Improving aeration in the right upper lobe. Continued left lower lobe atelectasis or infiltrate. Electronically Signed   By: Charlett Nose M.D.   On: 04/28/2023 03:42   DG Chest Port 1  View  Result Date: 04/27/2023 CLINICAL DATA:  Endotracheal intubation and central line placement. EXAM: PORTABLE CHEST 1 VIEW COMPARISON:  04/27/2023 FINDINGS: An endotracheal tube has been placed. Tip is in the origin of the right mainstem bronchus. Suggest retracting about 2 cm. Right central venous catheter with tip over the low SVC region. No pneumothorax. Heart size and pulmonary vascularity are normal. Patchy infiltrates in the right upper lung and left lower lung may represent pneumonia. Emphysematous changes are suggested in the lungs. No pleural effusions. Postoperative changes in the lumbar spine. IMPRESSION: 1. Endotracheal tube tip is in the origin of the right mainstem bronchus. Suggest retracting the tube about 2 cm. 2. Right central venous catheter appears in satisfactory position. 3. Emphysematous changes in the lungs. Patchy airspace disease suggesting pneumonia. Electronically Signed   By: Burman Nieves M.D.   On: 04/27/2023 19:40   DG Chest 1 View  Result Date: 04/27/2023 CLINICAL DATA:  Short S of breath EXAM: CHEST  1 VIEW COMPARISON:  04/21/2023 FINDINGS: Heart and mediastinal contours within normal limits. Improving bilateral airspace disease with persistent airspace opacity in the right upper lobe and minimal density at the left lung base. No effusions or acute bony abnormality. IMPRESSION: Improving bilateral airspace disease. Residual airspace opacity in the right upper lobe with minimal opacity at the left base. Electronically Signed   By: Charlett Nose M.D.   On: 04/27/2023 03:50     ECHO 04/22/2023: 1. Left ventricular ejection fraction, by estimation, is 30 to 35%. Left ventricular ejection fraction by 3D volume is 31 %. Left ventricular ejection fraction by PLAX is 34 %. The left ventricle has moderately decreased function. The left ventricle demonstrates global hypokinesis. Left ventricular diastolic parameters are consistent with Grade I diastolic dysfunction (impaired  relaxation).   2. Right ventricular systolic function is mildly reduced. The right ventricular size is normal.   3. The mitral valve is normal in structure. Mild mitral valve regurgitation. No evidence of mitral stenosis.   4. The aortic valve has an indeterminant number of cusps. Aortic valve regurgitation is moderate. No aortic stenosis is present.   5. The inferior vena cava is normal in size with greater than 50% respiratory variability, suggesting right atrial pressure of 3 mmHg.   TELEMETRY reviewed by me 04/28/23: sinus rhythm rate 60-70s, paroxysms up to 100s this morning  EKG reviewed by me 04/28/23: NSR rate 92 bpm, T wave inversions in aVL, V2  DATA reviewed by me 04/28/23: last 24h vitals tele labs imaging I/O, hospitalist progress notes, respiratory notes, nursing notes  Principal Problem:   Severe sepsis (HCC) Active Problems:   Depression with anxiety   History of stroke   CAP (community acquired pneumonia)   Pneumonia due to COVID-19 virus   Rhabdomyolysis   AKI (acute kidney injury) (HCC)   Acute metabolic encephalopathy   Urinary tract infection   Anemia  Acute on chronic respiratory failure with hypoxia (HCC)   Sepsis with acute hypoxic respiratory failure without septic shock (HCC)    ASSESSMENT AND PLAN: Teresa Sherman is a 77 y.o. female with a past medical history of CVA who presented to the ED on 04/11/2023 for SOB. She was seen by cardiology early in her admission for elevated troponin, NSTEMI in setting of covid infection. Cardiology reconsulted 8/30 for additional evaluation of severe LV dysfunction.  # Takotsubo cardiomyopathy vs covid-19 viral myocarditis # Acute heart failure reduced ejection fraction # NSTEMI Patient admitted with COVID-19 infection found to have NSTEMI on hospital day 5. Echo revealed severely reduced LV function. Was started on GDMT after BP was able to tolerate and doses titrated. Developed hypotension requiring pressor  support on 9/4. Currently on levo and vasopressin for BP support but are weaning. -GDMT currently held due to hypotension. Goal will be to restart as she improves and BP allows.  # Septic shock 2/2 ESBL UTI # Sinus tachycardia Patient with rates elevated 9/3, up to 150s. Now in the 60s on telemetry. Rate appears much improved with sedation. BP remains soft.  -Gentle IV hydration, now net negative 3.9 L.  -Meropenem and vancomycin  # Acute hypoxic respiratory failure # Covid-19 pneumonia Had increased oxygen requirement on BiPAP earlier in admission then weaned to room air by 9/3. Respiratory status declined overnight on 9/4 requiring intubation and mechanical ventilation. -Management per primary.   # Acute DVT # Anemia DVT found in LLE on [place] 04/22/2023.  -Eliquis switched to heparin on 9/4 now held due to anemia requiring blood transfusion overnight on 9/4.  This patient's case was discussed and created with Dr. Juliann Pares and he is in agreement.  Signed:  Gale Journey, PA-C  04/28/2023, 1:52 PM [facility] Cardiology

## 2023-04-28 NOTE — Progress Notes (Signed)
PHARMACY CONSULT NOTE - ELECTROLYTES  Pharmacy Consult for Electrolyte Monitoring and Replacement   Recent Labs: Height: 5' (152.4 cm) Weight: 53.9 kg (118 lb 13.3 oz) IBW/kg (Calculated) : 45.5 Estimated Creatinine Clearance: 41.6 mL/min (by C-G formula based on SCr of 0.84 mg/dL). Potassium (mmol/L)  Date Value  04/28/2023 4.0  11/26/2013 3.8   Magnesium (mg/dL)  Date Value  41/32/4401 2.1   Calcium (mg/dL)  Date Value  02/72/5366 7.6 (L)   Calcium, Total (mg/dL)  Date Value  44/10/4740 9.5   Albumin (g/dL)  Date Value  59/56/3875 1.9 (L)  04/30/2015 4.3  11/26/2013 3.9   Phosphorus (mg/dL)  Date Value  64/33/2951 3.6   Sodium (mmol/L)  Date Value  04/28/2023 140  04/30/2015 140  11/26/2013 142   Assessment  Teresa Sherman is a 77 y.o. female presenting with Severe sepsis secondary to pneumonia . PMH significant for CVA, Anemia, and depression. Pharmacy has been consulted to monitor and replace electrolytes.  Patient was emergently intubated 9/4 and made NPO  Goal of Therapy: Electrolytes WNL  Plan:  No electrolyte replacement indicated at this time (Corrected calcium 9.3) Recheck electrolytes tomorrow morning  Thank you for involving pharmacy in this patient's care.   Darolyn Rua, PharmD Student [employer]

## 2023-04-28 NOTE — Progress Notes (Signed)
his is a case of 77 year old female patient with a past medical history of depression anxiety, ESBL E. coli in the past, prior stroke and dementia presented to the emergency department on 08/21 with COVID-19 pneumonia.  Unfortunately course was complicated by new decrease in EF 30 to 35% on echocardiogram done during this admission.  Thought was this secondary to stress-induced cardiomyopathy.  From a respiratory on 09/01 she was on high flow nasal cannula at 50% 45 L.  However on 09/4 she became increasingly agitated on the floor persistent tachycardic treated with digoxin metoprolol and amiodarone despite being sinus tachycardia and brought in to the ICU.  On encounter today she was on high flow nasal cannula appears dry and fluids was given.  She required low-dose Levophed.  Her respiratory status became labored she was on high flow nasal cannula at 100% FiO2.  Unable to tolerate BiPAP becoming tachycardic and tachypneic.  We had multiple discussions with the family and we decided to proceed with intubation and mechanical ventilation.  Furthermore she was found to have a UTI and course was complicated by worsened shock.  She has a history of ESBL E. coli and therefore started on meropenem and vancomycin.  Overnight received 1 Unit PRBC with good response. Lactate downtrending. Norepi downtrending. Doing well on the ventilator.    Objective  Physical exam GEN intubated sedated HEENT supple neck reactive pupils CVS normal S1, normal S2, regular rate and rhythm Lungs coarse breath sounds bilaterally Abdomen soft nontender nondistended positive bowel sounds Extremities warm well-perfused no edema   Vent settings: PRVC 450-01-16-59%   Lactic 2.3 - WBC 17 downtrending - H&H 8.3/24 stable - Cr. 0.84.   Is&Os - +2L in 24 hrs.   CXR 09/05 reviewed - Infiltrates in left lower lung. ET tube in place and CVC in place.   Assessment and plan  Case of a 77 year old female patient past medical history as  above presented initially with COVID-19 pneumonia.  Course complicated by hypoxic respiratory failure, stress-induced cardiomyopathy and worsening septic shock secondary to UTI.  Currently intubated mechanically ventilated on 09/4.  On norepinephrine and vasopressin for pressor support.   # Shock mixed picture septic and cardiogenic secondary to  # UTI with history of ESBL UTI # Takotsubo cardiomyopathy versus COVID-19 myocarditis EF 30% with global hypokinesis (08/30) #Acute hypoxic respiratory failure requiring intubation and mechanical ventilation.  # COVID-19 pneumonia #c/p HAGMA with lactic acidosis now improving # Anemia with hemoglobin 6.1 g/dL raises concern for bone marrow suppression no significant signs of active bleeding.  Will give 1 unit packed RBC and assess response.  Should that showed not adequate response then we will proceed with CTA abdomen pelvis. #LLE DVT on 08/30 - Was Anticoagulated with Eliquis on the floor. Holding in setting of Anemia. Plan to challenge with heparin drip once hgb stable. Will start DVT prophylaxis and rechallenge with heparin drip tomorrow should her Hgb remain stable. If unable will need an IVC filter.    Neuro: Precedex and fentanyl for RASS 0 to -1 and see about less than 2.  CVS/ID: Norepinephrine and vasopressin for MAP more than 65.  Vancomycin meropenem for antibiotics suspicion for UTI ESBL E. coli. Pulmonary: Vent support to maintain sats greater than 95. Wean as tolerated. Continue with prednisone taper. GI: Famotidine for GI prophylaxis.  Start Tube feeds.  Renal: Monitor UOP Endo: Glucose in range   F -none E -as needed N - start TF P - Start DVT prophylaxis. Rechallenge with heparin  drip in the AM if Hgb stable.  C-full code.   Janann Colonel, MD>

## 2023-04-28 NOTE — Plan of Care (Signed)
  Problem: Fluid Volume: Goal: Hemodynamic stability will improve Outcome: Progressing   Problem: Clinical Measurements: Goal: Signs and symptoms of infection will decrease Outcome: Progressing   Problem: Coping: Goal: Psychosocial and spiritual needs will be supported Outcome: Progressing   Problem: Respiratory: Goal: Will maintain a patent airway Outcome: Progressing Goal: Complications related to the disease process, condition or treatment will be avoided or minimized Outcome: Progressing   Problem: Education: Goal: Knowledge of General Education information will improve Description: Including pain rating scale, medication(s)/side effects and non-pharmacologic comfort measures Outcome: Progressing   Problem: Health Behavior/Discharge Planning: Goal: Ability to manage health-related needs will improve Outcome: Progressing   Problem: Clinical Measurements: Goal: Ability to maintain clinical measurements within normal limits will improve Outcome: Progressing Goal: Diagnostic test results will improve Outcome: Progressing Goal: Respiratory complications will improve Outcome: Progressing Goal: Cardiovascular complication will be avoided Outcome: Progressing   Problem: Activity: Goal: Risk for activity intolerance will decrease Outcome: Progressing   Problem: Nutrition: Goal: Adequate nutrition will be maintained Outcome: Progressing   Problem: Coping: Goal: Level of anxiety will decrease Outcome: Progressing   Problem: Elimination: Goal: Will not experience complications related to bowel motility Outcome: Progressing Goal: Will not experience complications related to urinary retention Outcome: Progressing   Problem: Pain Managment: Goal: General experience of comfort will improve Outcome: Progressing   Problem: Safety: Goal: Ability to remain free from injury will improve Outcome: Progressing   Problem: Coping: Goal: Ability to adjust to condition or  change in health will improve Outcome: Progressing   Problem: Fluid Volume: Goal: Ability to maintain a balanced intake and output will improve Outcome: Progressing   Problem: Metabolic: Goal: Ability to maintain appropriate glucose levels will improve Outcome: Progressing   Problem: Nutritional: Goal: Maintenance of adequate nutrition will improve Outcome: Progressing   Problem: Tissue Perfusion: Goal: Adequacy of tissue perfusion will improve Outcome: Progressing   Problem: Respiratory: Goal: Ability to maintain a clear airway and adequate ventilation will improve Outcome: Progressing   Problem: Role Relationship: Goal: Method of communication will improve Outcome: Progressing

## 2023-04-29 DIAGNOSIS — A419 Sepsis, unspecified organism: Secondary | ICD-10-CM | POA: Diagnosis not present

## 2023-04-29 DIAGNOSIS — D649 Anemia, unspecified: Secondary | ICD-10-CM

## 2023-04-29 DIAGNOSIS — I82452 Acute embolism and thrombosis of left peroneal vein: Secondary | ICD-10-CM

## 2023-04-29 DIAGNOSIS — R6520 Severe sepsis without septic shock: Secondary | ICD-10-CM | POA: Diagnosis not present

## 2023-04-29 DIAGNOSIS — G9341 Metabolic encephalopathy: Secondary | ICD-10-CM

## 2023-04-29 LAB — CBC WITH DIFFERENTIAL/PLATELET
Abs Immature Granulocytes: 0.1 10*3/uL — ABNORMAL HIGH (ref 0.00–0.07)
Basophils Absolute: 0 10*3/uL (ref 0.0–0.1)
Basophils Relative: 0 %
Eosinophils Absolute: 0 10*3/uL (ref 0.0–0.5)
Eosinophils Relative: 0 %
HCT: 22.9 % — ABNORMAL LOW (ref 36.0–46.0)
Hemoglobin: 7.9 g/dL — ABNORMAL LOW (ref 12.0–15.0)
Immature Granulocytes: 1 %
Lymphocytes Relative: 15 %
Lymphs Abs: 1.4 10*3/uL (ref 0.7–4.0)
MCH: 31.6 pg (ref 26.0–34.0)
MCHC: 34.5 g/dL (ref 30.0–36.0)
MCV: 91.6 fL (ref 80.0–100.0)
Monocytes Absolute: 0.7 10*3/uL (ref 0.1–1.0)
Monocytes Relative: 8 %
Neutro Abs: 7.4 10*3/uL (ref 1.7–7.7)
Neutrophils Relative %: 76 %
Platelets: 152 10*3/uL (ref 150–400)
RBC: 2.5 MIL/uL — ABNORMAL LOW (ref 3.87–5.11)
RDW: 14.8 % (ref 11.5–15.5)
WBC: 9.7 10*3/uL (ref 4.0–10.5)
nRBC: 0.3 % — ABNORMAL HIGH (ref 0.0–0.2)

## 2023-04-29 LAB — MAGNESIUM: Magnesium: 2.2 mg/dL (ref 1.7–2.4)

## 2023-04-29 LAB — GLUCOSE, CAPILLARY
Glucose-Capillary: 94 mg/dL (ref 70–99)
Glucose-Capillary: 90 mg/dL (ref 70–99)
Glucose-Capillary: 121 mg/dL — ABNORMAL HIGH (ref 70–99)
Glucose-Capillary: 115 mg/dL — ABNORMAL HIGH (ref 70–99)
Glucose-Capillary: 112 mg/dL — ABNORMAL HIGH (ref 70–99)
Glucose-Capillary: 101 mg/dL — ABNORMAL HIGH (ref 70–99)

## 2023-04-29 LAB — TYPE AND SCREEN
ABO/RH(D): O POS
Antibody Screen: NEGATIVE
Unit division: 0
Unit division: 0

## 2023-04-29 LAB — URINE CULTURE: Culture: <10000 — AB

## 2023-04-29 LAB — PHOSPHORUS: Phosphorus: 1.9 mg/dL — ABNORMAL LOW (ref 2.5–4.6)

## 2023-04-29 LAB — BASIC METABOLIC PANEL
Anion gap: 3 — ABNORMAL LOW (ref 5–15)
BUN: 39 mg/dL — ABNORMAL HIGH (ref 8–23)
CO2: 22 mmol/L (ref 22–32)
Calcium: 7.7 mg/dL — ABNORMAL LOW (ref 8.9–10.3)
Chloride: 112 mmol/L — ABNORMAL HIGH (ref 98–111)
Creatinine, Ser: 0.46 mg/dL (ref 0.44–1.00)
GFR, Estimated: >60 mL/min (ref >60)
Glucose, Bld: 93 mg/dL (ref 70–99)
Potassium: 3.5 mmol/L (ref 3.5–5.1)
Sodium: 137 mmol/L (ref 135–145)

## 2023-04-29 LAB — BPAM RBC
Blood Product Expiration Date: 202410072359
Blood Product Expiration Date: 202410072359
ISSUE DATE / TIME: 202409042252
ISSUE DATE / TIME: 202409051822
Unit Type and Rh: 5100
Unit Type and Rh: 5100

## 2023-04-29 LAB — HEMOGLOBIN AND HEMATOCRIT, BLOOD
HCT: 22.5 % — ABNORMAL LOW (ref 36.0–46.0)
Hemoglobin: 7.9 g/dL — ABNORMAL LOW (ref 12.0–15.0)

## 2023-04-29 LAB — PROCALCITONIN: Procalcitonin: 0.24 ng/mL

## 2023-04-29 MED ORDER — THIAMINE HCL 100 MG PO TABS
100.0000 mg | ORAL_TABLET | Freq: Every day | ORAL | Status: DC
  Administered 2023-04-29: 100 mg
  Filled 2023-04-29 (×4): qty 1

## 2023-04-29 MED ORDER — IPRATROPIUM-ALBUTEROL 0.5-2.5 (3) MG/3ML IN SOLN
3.0000 mL | Freq: Four times a day (QID) | RESPIRATORY_TRACT | Status: DC
  Administered 2023-04-29 – 2023-04-30 (×2): 3 mL via RESPIRATORY_TRACT
  Filled 2023-04-29 (×2): qty 3

## 2023-04-29 MED ORDER — LACTATED RINGERS IV BOLUS
500.0000 mL | Freq: Once | INTRAVENOUS | Status: AC
  Administered 2023-04-29: 500 mL via INTRAVENOUS

## 2023-04-29 MED ORDER — POTASSIUM PHOSPHATES 15 MMOLE/5ML IV SOLN
30.0000 mmol | Freq: Once | INTRAVENOUS | Status: AC
  Administered 2023-04-29: 30 mmol via INTRAVENOUS
  Filled 2023-04-29: qty 10

## 2023-04-29 NOTE — Progress Notes (Signed)
Extubation order written. Cuff leak noted. Patient extubated @ 1305 and placed on 3lpm [place].  Patient tolerated well. Will continue to monitor.

## 2023-04-29 NOTE — IPAL (Signed)
  Interdisciplinary Goals of Care Family Meeting   Date carried out: 04/29/2023  Location of the meeting: Conference room  Member's involved: Physician, Bedside Registered Nurse, and Family Member or next of kin   GOALS OF CARE DISCUSSION  The Clinical status was relayed to family in detail- Bothers sisters, Daughter HCPOA  Updated and notified of patients medical condition- Patient remains unresponsive and will not open eyes to command.   Patient is having a weak cough and struggling to remove secretions.   Patient with increased WOB and using accessory muscles to breathe Explained to family course of therapy and the modalities   Patient with Progressive multiorgan failure with a very high probablity of a very minimal chance of meaningful recovery despite all aggressive and optimal medical therapy.    Family understands the situation.  They have consented and agreed to DNR status and plan for SAT/SBT and plan for one way extubation at some point  Family are satisfied with Plan of action and management. All questions answered  Additional CC time 35 mins   Mikey Maffett Santiago Glad, M.D.  [facility]  Medical Director [facility] Medical Director [facility] Cardio-Pulmonary Department

## 2023-04-29 NOTE — Plan of Care (Signed)
Discussed with patient plan of care for the evening, pain management and medications with little teach back displayed.  What is important to patient is rest and reassurance.  Problem: Respiratory: Goal: Ability to maintain adequate ventilation will improve Outcome: Not Progressing   Problem: Coping: Goal: Psychosocial and spiritual needs will be supported Outcome: Not Progressing

## 2023-04-29 NOTE — Progress Notes (Signed)
Wasted 40 ml of Precedex when removing bag from room with CN Erica.  MD aware that Precedex, Levophed, Fentanyl and Versed orders still on the chart from when the patient was intubated.

## 2023-04-29 NOTE — Progress Notes (Signed)
[facility] CARDIOLOGY PROGRESS NOTE   Patient ID: Teresa Sherman MRN: [id number] DOB/AGE: 1946-04-12 77 y.o.  Admit date: 04/11/2023 Referring Physician Dr. Joylene Igo Primary Physician Dr [name]  Primary Cardiologist none Reason for Consultation LV dysfunction  HPI: Teresa Sherman is a 77 y.o. female with a past medical history of CVA who presented to the ED on 04/11/2023 for SOB. She was seen by cardiology early in her admission for elevated troponin, NSTEMI in setting of covid infection. Cardiology reconsulted 8/30 for additional evaluation of severe LV dysfunction.  Interval History:  -Patient remains intubated and sedated today. PCCM to have GOC discussion with family today. -BP improving, weaned off vasopressin and levo dose titrated down.  -HR stable in the 60-70s, brady to 40-50s overnight.   Review of systems complete and found to be negative unless listed above    Vitals:   04/29/23 1145 04/29/23 1200 04/29/23 1215 04/29/23 1230  BP: 111/61 112/63 108/67   Pulse: 90 74 66 88  Resp: (!) 24 20 (!) 22 17  Temp:  98.3 F (36.8 C)    TempSrc:  Axillary    SpO2: 100% 100% 99% 99%  Weight:      Height:         Intake/Output Summary (Last 24 hours) at 04/29/2023 1249 Last data filed at 04/29/2023 1200 Gross per 24 hour  Intake 1975.62 ml  Output 1400 ml  Net 575.62 ml     PHYSICAL EXAM General: Ill-appearing elderly female, in no acute distress. Intubated & sedated. HEENT: Normocephalic and atraumatic. Neck: No JVD.  Lungs: Intubated on 30% FiO2. Coarse breath sounds bilaterally.  Heart: HRRR. Normal S1 and S2 without gallops or murmurs. Radial & DP pulses 2+ bilaterally. Abdomen: Non-distended appearing.  Msk: Normal strength and tone for age. Extremities: No clubbing, cyanosis or edema.  Warm & well perfused.   LABS: Basic Metabolic Panel: Recent Labs    04/28/23 0329 04/29/23 0413  NA 140 137  K 4.0 3.5  CL 112* 112*  CO2 20* 22  GLUCOSE  171* 93  BUN 68* 39*  CREATININE 0.84 0.46  CALCIUM 7.6* 7.7*  MG 2.1 2.2  PHOS 3.6 1.9*   Liver Function Tests: Recent Labs    04/27/23 2011  AST 18  ALT 18  ALKPHOS 40  BILITOT 0.8  PROT 4.0*  ALBUMIN 1.9*   No results for input(s): "LIPASE", "AMYLASE" in the last 72 hours. CBC: Recent Labs    04/28/23 0329 04/28/23 1335 04/28/23 2228 04/29/23 0413  WBC 17.4* 13.6*  --  9.7  NEUTROABS 14.8*  --   --  7.4  HGB 8.3* 6.7* 8.5* 7.9*  HCT 24.0* 19.5* 24.5* 22.9*  MCV 93.0 94.2  --  91.6  PLT 231 160  --  152   Cardiac Enzymes: No results for input(s): "CKTOTAL", "CKMB", "CKMBINDEX", "TROPONINIHS" in the last 72 hours. BNP: No results for input(s): "BNP" in the last 72 hours.  D-Dimer: No results for input(s): "DDIMER" in the last 72 hours. Hemoglobin A1C: Recent Labs    04/27/23 1139  HGBA1C 5.9*   Fasting Lipid Panel: No results for input(s): "CHOL", "HDL", "LDLCALC", "TRIG", "CHOLHDL", "LDLDIRECT" in the last 72 hours. Thyroid Function Tests: No results for input(s): "TSH", "T4TOTAL", "T3FREE", "THYROIDAB" in the last 72 hours.  Invalid input(s): "FREET3" Anemia Panel: No results for input(s): "VITAMINB12", "FOLATE", "FERRITIN", "TIBC", "IRON", "RETICCTPCT" in the last 72 hours.   DG Abd Portable 1V  Result Date: 04/28/2023 CLINICAL DATA:  Orogastric tube placement. EXAM: PORTABLE ABDOMEN - 1 VIEW COMPARISON:  CT earlier today FINDINGS: Tip of the enteric tube in the right upper quadrant in the region of the distal stomach. The side-port is in the region of the gastric body. Contrast from prior CT is persistently in small bowel. IMPRESSION: Tip and side port of the enteric tube below the diaphragm in the stomach. Electronically Signed   By: Narda Rutherford M.D.   On: 04/28/2023 22:45   CT ABDOMEN PELVIS WO CONTRAST  Result Date: 04/28/2023 : Sepsis EXAM: CT ABDOMEN AND PELVIS WITHOUT CONTRAST TECHNIQUE: Multidetector CT imaging of the abdomen and pelvis was  performed following the standard protocol without IV contrast. RADIATION DOSE REDUCTION: This exam was performed according to the departmental dose-optimization program which includes automated exposure control, adjustment of the mA and/or kV according to patient size and/or use of iterative reconstruction technique. COMPARISON:  Chest x-ray 04/28/2023 FINDINGS: Lower chest: Visualized lungs demonstrate peribronchovascular ground-glass and consolidative airspace opacities. Trace right pleural effusion. Cardiac changes suggestive of anemia. Partially visualized central venous catheter with tip terminating at superior cavoatrial junction. Hepatobiliary: No focal liver abnormality. No gallstones, gallbladder wall thickening, or pericholecystic fluid. No biliary dilatation. Pancreas: No focal lesion. Normal pancreatic contour. No surrounding inflammatory changes. No main pancreatic ductal dilatation. Spleen: Normal in size without focal abnormality. Adrenals/Urinary Tract: No adrenal nodule bilaterally. Bilateral kidneys enhance symmetrically. No hydronephrosis. No hydroureter. The urinary bladder is decompressed with Foley catheter tip and balloon terminating within the lumen. Stomach/Bowel: PO contrast reaches the colon. Enteric tube courses below the hemidiaphragm with side port at the level of the pylorus and tip within the second portion of the duodenum. Stomach is within normal limits. No evidence of bowel wall thickening or dilatation. Colonic diverticulosis. Appendix appears normal. Vascular/Lymphatic: No abdominal aorta or iliac aneurysm. Mild atherosclerotic plaque of the aorta and its branches. No abdominal, pelvic, or inguinal lymphadenopathy. Reproductive: Uterus and bilateral adnexa are unremarkable. Other: No intraperitoneal free fluid. No intraperitoneal free gas. No organized fluid collection. Musculoskeletal: No abdominal wall hernia or abnormality. Diffusely decreased bone density. No suspicious lytic  or blastic osseous lesions. No acute displaced fracture. Multilevel degenerative changes of the spine. Chronic vertebral body height loss of T8, T11, T12 levels. L1-L5 posterolateral hardware fusion in the setting of chronic L3 compression fracture. IMPRESSION: 1. Visualized lungs demonstrate peribronchovascular ground-glass and consolidative airspace opacities. Finding may represent infection/inflammation with alveolar hemorrhage not excluded. 2. Trace right pleural effusion. 3. Enteric tube courses below the hemidiaphragm with side port at the level of the pylorus and tip within the second portion of the duodenum. Consider retracting by 12 cm. 4. Colonic diverticulosis with no acute diverticulitis. 5. Other imaging findings of potential clinical significance: Cardiac changes suggestive of anemia. Electronically Signed   By: Tish Frederickson M.D.   On: 04/28/2023 21:35   DG Chest Port 1 View  Result Date: 04/28/2023 CLINICAL DATA:  Hypoxia, repositioned ET tube EXAM: PORTABLE CHEST 1 VIEW COMPARISON:  04/27/2023 FINDINGS: Endotracheal tube was retracted, now 3 cm above the carina. Right central line and NG tube are unchanged. Heart and mediastinal contours are within normal limits. Improved aeration in the right upper lobe. Residual left lower lobe atelectasis or infiltrate. IMPRESSION: Interval repositioning of the endotracheal tube in expected position. Improving aeration in the right upper lobe. Continued left lower lobe atelectasis or infiltrate. Electronically Signed   By: Charlett Nose M.D.   On: 04/28/2023 03:42   DG Chest Port 1  View  Result Date: 04/27/2023 CLINICAL DATA:  Endotracheal intubation and central line placement. EXAM: PORTABLE CHEST 1 VIEW COMPARISON:  04/27/2023 FINDINGS: An endotracheal tube has been placed. Tip is in the origin of the right mainstem bronchus. Suggest retracting about 2 cm. Right central venous catheter with tip over the low SVC region. No pneumothorax. Heart size and  pulmonary vascularity are normal. Patchy infiltrates in the right upper lung and left lower lung may represent pneumonia. Emphysematous changes are suggested in the lungs. No pleural effusions. Postoperative changes in the lumbar spine. IMPRESSION: 1. Endotracheal tube tip is in the origin of the right mainstem bronchus. Suggest retracting the tube about 2 cm. 2. Right central venous catheter appears in satisfactory position. 3. Emphysematous changes in the lungs. Patchy airspace disease suggesting pneumonia. Electronically Signed   By: Burman Nieves M.D.   On: 04/27/2023 19:40     ECHO 04/22/2023: 1. Left ventricular ejection fraction, by estimation, is 30 to 35%. Left ventricular ejection fraction by 3D volume is 31 %. Left ventricular ejection fraction by PLAX is 34 %. The left ventricle has moderately decreased function. The left ventricle demonstrates global hypokinesis. Left ventricular diastolic parameters are consistent with Grade I diastolic dysfunction (impaired relaxation).   2. Right ventricular systolic function is mildly reduced. The right ventricular size is normal.   3. The mitral valve is normal in structure. Mild mitral valve regurgitation. No evidence of mitral stenosis.   4. The aortic valve has an indeterminant number of cusps. Aortic valve regurgitation is moderate. No aortic stenosis is present.   5. The inferior vena cava is normal in size with greater than 50% respiratory variability, suggesting right atrial pressure of 3 mmHg.   TELEMETRY reviewed by me 04/29/23: sinus rhythm rate 60-70s, bradycardic to 40-50s overnight  EKG reviewed by me 04/29/23: NSR rate 92 bpm, T wave inversions in aVL, V2  DATA reviewed by me 04/29/23: last 24h vitals tele labs imaging I/O, hospitalist progress notes, respiratory notes, nursing notes  Principal Problem:   Severe sepsis (HCC) Active Problems:   Depression with anxiety   History of stroke   CAP (community acquired pneumonia)    Pneumonia due to COVID-19 virus   Rhabdomyolysis   AKI (acute kidney injury) (HCC)   Acute metabolic encephalopathy   Urinary tract infection   Anemia   Acute on chronic respiratory failure with hypoxia (HCC)   Sepsis with acute hypoxic respiratory failure without septic shock (HCC)    ASSESSMENT AND PLAN: Ivylynn Florine Sybert is a 77 y.o. female with a past medical history of CVA who presented to the ED on 04/11/2023 for SOB. She was seen by cardiology early in her admission for elevated troponin, NSTEMI in setting of covid infection. Cardiology reconsulted 8/30 for additional evaluation of severe LV dysfunction.  # Takotsubo cardiomyopathy vs covid-19 viral myocarditis # Acute heart failure reduced ejection fraction # NSTEMI Patient admitted with COVID-19 infection found to have NSTEMI on hospital day 5. Echo revealed severely reduced LV function. Was started on GDMT after BP was able to tolerate and doses titrated. Developed hypotension requiring pressor support on 9/4.  -Vasopressin discontinued yesterday afternoon. Currently weaning levophed. -GDMT currently held due to hypotension. Goal will be to restart as she improves and BP allows.  # Septic shock 2/2 ESBL UTI # Sinus tachycardia, resolved Patient with rates elevated 9/3, up to 150s. Now in the 60s on telemetry. Rate appears much improved with sedation. BP remains soft.  -Gentle IV hydration,  now net negative 3.1 L.  -Meropenem and vancomycin now discontinued  # Acute hypoxic respiratory failure # Covid-19 pneumonia Had increased oxygen requirement on BiPAP earlier in admission then weaned to room air by 9/3. Respiratory status declined overnight on 9/4 requiring intubation and mechanical ventilation. -Management per primary.   # Acute DVT # Anemia DVT found in LLE on [place] 04/22/2023.  -Eliquis switched to heparin on 9/4 now held due to anemia requiring blood transfusion overnight on 9/4. -Continue to monitor H&H  closely.  This patient's case was discussed and created with Dr. Melton Alar and she is in agreement.  Signed:  Gale Journey, PA-C  04/29/2023, 12:49 PM [facility] Cardiology

## 2023-04-29 NOTE — Progress Notes (Signed)
NAME:  Teresa Sherman, MRN:  [id number], DOB:  06-08-1946, LOS: 18 ADMISSION DATE:  04/11/2023, CONSULTATION DATE:  04/21/2023 REFERRING MD:  Dr. Joylene Igo, CHIEF COMPLAINT:  AMS, Acute Hypoxic Respiratory Failure   Brief Pt Description / Synopsis:  77 y.o. female admitted with Sepsis, Acute Metabolic Encephalopathy, and Acute Hypoxic Respiratory Failure in the setting of COVID-19 Pneumonia and suspected superimposed bacterial pneumonia.  High risk for intubation.  History of Present Illness:  Teresa Sherman is a 77 y.o. female with medical history significant for Prior stroke, depression with anxiety, prior ESBL E. coli, who presents to the emergency room by EMS with altered mental status.  She was found after wellness check was requested after she had not been seen in 2 days.  She was found somnolent and confused in her bed and had unintelligible mumbling but without focal neurologic deficits.  She is reportedly oriented x 3 at baseline.  She was also noted by EMS to have foul-smelling urine   ED Course: Initial Vital Signs: Normotensive tachycardic to 115, tachypneic to 23 with O2 sat 90 to 95% on room air, BP 101/83 improving to 126/87 with IV fluids  Significant Labs: WBC of 8.1 but lactic acid 2.4 and procalcitonin 7.73. Hemoglobin noted to be 7.8 with baseline around 13 a year ago. CMP notable for creatinine of 1.78, up from 0.51 a year ago, AST/ALT 45/23. COVID-positive. CK14 38.G,  Imaging Chest X-ray>>IMPRESSION: Right lower lung zone airspace opacity. Followup PA and lateral chest X-ray is recommended in 3-4 weeks following trial of antibiotic therapy to ensure resolution and exclude underlying malignancy. Medications Administered: IV fluids, Rocephin for suspected UTI  Hospitalists were asked to admit for further workup and treatment.   Please see "Significant Hospital Events" section below for full detailed hospital course.  Pertinent  Medical History   Past Medical  History:  Diagnosis Date   Allergy    Anemia 04/11/2023   Anxiety    Cataract    Combined fat and carbohydrate induced hyperlipemia 01/26/2015   Depression    GERD (gastroesophageal reflux disease)      Micro Data:  8/19: SARS-CoV-2 PCR>> positive 8/19: Blood culture (aerobic bottle only)>> Staphylococcus hominis (felt to be contaminant) 8/20: Urine>> no growth 8/21: Repeat blood cultures>> no growth 8/29: Fungitell>>  Antimicrobials:   Anti-infectives (From admission, onward)    Start     Dose/Rate Route Frequency Ordered Stop   04/28/23 0600  vancomycin (VANCOREADY) IVPB 750 mg/150 mL        750 mg 150 mL/hr over 60 Minutes Intravenous Every 24 hours 04/27/23 1222     04/27/23 1400  meropenem (MERREM) 1 g in sodium chloride 0.9 % 100 mL IVPB        1 g 200 mL/hr over 30 Minutes Intravenous Every 12 hours 04/27/23 1210     04/27/23 1200  vancomycin (VANCOCIN) IVPB 1000 mg/200 mL premix        1,000 mg 200 mL/hr over 60 Minutes Intravenous  Once 04/27/23 0958 04/27/23 1259   04/27/23 1045  piperacillin-tazobactam (ZOSYN) IVPB 3.375 g  Status:  Discontinued        3.375 g 12.5 mL/hr over 240 Minutes Intravenous Every 8 hours 04/27/23 0958 04/27/23 1156   04/18/23 1230  piperacillin-tazobactam (ZOSYN) IVPB 3.375 g        3.375 g 12.5 mL/hr over 240 Minutes Intravenous Every 8 hours 04/18/23 1121 04/23/23 0135   04/18/23 1215  piperacillin-tazobactam (ZOSYN) IVPB 3.375 g  Status:  Discontinued        3.375 g 100 mL/hr over 30 Minutes Intravenous Every 6 hours 04/18/23 1117 04/18/23 1121   04/16/23 0230  Ampicillin-Sulbactam (UNASYN) 3 g in sodium chloride 0.9 % 100 mL IVPB  Status:  Discontinued        3 g 200 mL/hr over 30 Minutes Intravenous Every 6 hours 04/16/23 0143 04/18/23 1117   04/12/23 1600  cefTRIAXone (ROCEPHIN) 2 g in sodium chloride 0.9 % 100 mL IVPB        2 g 200 mL/hr over 30 Minutes Intravenous Every 24 hours 04/11/23 2030 04/15/23 1748   04/11/23 2100   azithromycin (ZITHROMAX) 500 mg in sodium chloride 0.9 % 250 mL IVPB        500 mg 250 mL/hr over 60 Minutes Intravenous Every 24 hours 04/11/23 2030 04/15/23 2227   04/11/23 1730  cefTRIAXone (ROCEPHIN) 2 g in sodium chloride 0.9 % 100 mL IVPB  Status:  Discontinued        2 g 200 mL/hr over 30 Minutes Intravenous Every 24 hours 04/11/23 1716 04/11/23 2040        Significant Hospital Events: Including procedures, antibiotic start and stop dates in addition to other pertinent events   8/19: Admitted by [facility]. 8/21-8/27 (per [facility]): Pt was found to have severe sepsis secondary to COVID19 pneumonia & possible co-bacterial infection. Completed IV rocephin, azithromycin x 5 days but then restarted on unasyn which was then changed to IV zosyn as pt has been moved into stepdown x 2 overnight this admission so far. Pt was placed on BiPAP overnight (last night) and is currently on HFNC. Discussed code status w/ pt and she wants to continue full code.  8/23: Developed hypotension and increased fiO2 requirements, received IF fluids and albumin, moved to Stepdown.  Troponin elevated, placed on Heparin gtt, Cardiology consulted 8/24: Elevated Troponin felt to be due to demand ischemia, BP too soft for GDMT 8/25: moved back to Progressive Cardiac unit 8/26: Moved back to Stepdown due to AMS and removing supplemental O2 with desaturations.  8/29: Increasing FiO2 requirements (up to 90% FiO2 via HHFNC).  PCCM consulted as she is high risk for intubation. 8/30: Decreased HHFNC to 65%/50L. Remains delirious. Found to have LLE DVT. 8/31: did not tolerate BPAP overnight. Remains on significant HHFNC. 9/1: tolerated BPAP overnight. Down to 50%/45L. PCCM signed off to [facility] 9/4: PCCM re-consulted for increased agitation, persistent tachycardia despite Dig, metoprolol and Amiodarone, also hypotensive  despite IVFs bolus now requiring vasopressor. SEVERE HYPOXIA REQUIRING INTUBATION  Interim History / Subjective:   REMAINS ON VENT CRITICALLY ILL SEVERE COVID INFECTION WITH HYPERCOAGULABLE STATE AND SEVERE ANEMIA  Objective   Blood pressure (!) 107/59, pulse (!) 57, temperature 97.7 F (36.5 C), temperature source Oral, resp. rate (!) 22, height 5' (1.524 m), weight 56.2 kg, SpO2 97%. CVP:  [4 mmHg-14 mmHg] 7 mmHg  Vent Mode: PRVC FiO2 (%):  [30 %-40 %] 30 % Set Rate:  [22 bmp] 22 bmp Vt Set:  [450 mL] 450 mL PEEP:  [5 cmH20] 5 cmH20 Plateau Pressure:  [15 cmH20-22 cmH20] 22 cmH20   Intake/Output Summary (Last 24 hours) at 04/29/2023 0753 Last data filed at 04/29/2023 0500 Gross per 24 hour  Intake 1343.15 ml  Output 1400 ml  Net -56.85 ml   Filed Weights   04/27/23 0500 04/28/23 0500 04/29/23 0328  Weight: 51 kg 53.9 kg 56.2 kg     REVIEW OF SYSTEMS  PATIENT IS UNABLE TO PROVIDE  COMPLETE REVIEW OF SYSTEMS DUE TO SEVERE CRITICAL ILLNESS   PHYSICAL EXAMINATION:  GENERAL:critically ill appearing, +resp distress EYES: Pupils equal, round, reactive to light.  No scleral icterus.  MOUTH: Moist mucosal membrane. INTUBATED NECK: Supple.  PULMONARY: Lungs clear to auscultation, +rhonchi, +wheezing CARDIOVASCULAR: S1 and S2.  Regular rate and rhythm GASTROINTESTINAL: Soft, nontender, -distended. Positive bowel sounds.  MUSCULOSKELETAL: No swelling, clubbing, or edema.  NEUROLOGIC: obtunded,sedated SKIN:normal, warm to touch, Capillary refill delayed  Pulses present bilaterally   Labs   CBC: Recent Labs  Lab 04/27/23 0626 04/27/23 2011 04/28/23 0329 04/28/23 1335 04/28/23 2228 04/29/23 0413  WBC 17.4* 24.2* 17.4* 13.6*  --  9.7  NEUTROABS  --   --  14.8*  --   --  7.4  HGB 7.8* 6.1* 8.3* 6.7* 8.5* 7.9*  HCT 23.2* 18.4* 24.0* 19.5* 24.5* 22.9*  MCV 93.9 95.8 93.0 94.2  --  91.6  PLT 356 302 231 160  --  152    Basic Metabolic Panel: Recent Labs  Lab 04/25/23 0335 04/26/23 0456 04/27/23 0626 04/27/23 2011 04/28/23 0329 04/29/23 0413  NA 134* 134* 140 137 140 137  K  4.1 4.2 4.5 4.7 4.0 3.5  CL 105 104 115* 112* 112* 112*  CO2 23 21* 18* 19* 20* 22  GLUCOSE 83 108* 178* 290* 171* 93  BUN 29* 38* 77* 76* 68* 39*  CREATININE 0.48 0.48 0.70 0.81 0.84 0.46  CALCIUM 7.9* 8.3* 7.2* 7.1* 7.6* 7.7*  MG 2.1  --  2.2 2.0 2.1 2.2  PHOS 3.3 3.5  --  4.2 3.6 1.9*   GFR: Estimated Creatinine Clearance: 47.8 mL/min (by C-G formula based on SCr of 0.46 mg/dL). Recent Labs  Lab 04/23/23 0225 04/24/23 0406 04/27/23 0626 04/27/23 1139 04/27/23 1557 04/27/23 2011 04/28/23 0329 04/28/23 1335 04/28/23 2228 04/29/23 0413  PROCALCITON 0.14  --  <0.10  --   --   --  0.51  --   --  0.24  WBC 12.9*   < > 17.4*  --   --  24.2* 17.4* 13.6*  --  9.7  LATICACIDVEN  --   --   --    < > 2.2* 2.6* 2.3*  --  1.0  --    < > = values in this interval not displayed.    Liver Function Tests: Recent Labs  Lab 04/23/23 0600 04/24/23 0406 04/25/23 0335 04/26/23 0456 04/27/23 2011  AST  --   --   --   --  18  ALT  --   --   --   --  18  ALKPHOS  --   --   --   --  40  BILITOT  --   --   --   --  0.8  PROT  --   --   --   --  4.0*  ALBUMIN 2.4* 2.3* 2.4* 2.5* 1.9*   No results for input(s): "LIPASE", "AMYLASE" in the last 168 hours. No results for input(s): "AMMONIA" in the last 168 hours.  ABG    Component Value Date/Time   PHART 7.45 04/28/2023 0912   PCO2ART 28 (L) 04/28/2023 0912   PO2ART 108 04/28/2023 0912   HCO3 19.5 (L) 04/28/2023 0912   ACIDBASEDEF 3.2 (H) 04/28/2023 0912   O2SAT 99.9 04/28/2023 0912     Coagulation Profile: Recent Labs  Lab 04/27/23 2012 04/28/23 0329  INR 3.4* 2.6*    Cardiac Enzymes: No results for input(s): "CKTOTAL", "CKMB", "CKMBINDEX", "TROPONINI" in  the last 168 hours.   HbA1C: Hgb A1c MFr Bld  Date/Time Value Ref Range Status  04/27/2023 11:39 AM 5.9 (H) 4.8 - 5.6 % Final    Comment:    (NOTE) Pre diabetes:          5.7%-6.4%  Diabetes:              >6.4%  Glycemic control for   <7.0% adults with diabetes    11/30/2021 04:21 AM 5.4 4.8 - 5.6 % Final    Comment:    (NOTE) Pre diabetes:          5.7%-6.4%  Diabetes:              >6.4%  Glycemic control for   <7.0% adults with diabetes     CBG: Recent Labs  Lab 04/28/23 1526 04/28/23 1943 04/28/23 2314 04/29/23 0328 04/29/23 0724  GLUCAP 117* 109* 113* 94 90     Assessment & Plan:   ADMITTED TO ICU Acute Hypoxic Respiratory Failure in setting of COVID-19 Pneumonia & suspected superimposed bacterial pneumonia WITH ESBL UTI SEPSIS WITH deconditioning STATS  Severe ACUTE Hypoxic and Hypercapnic Respiratory Failure -continue Mechanical Ventilator support -Wean Fio2 and PEEP as tolerated -VAP/VENT bundle implementation - Wean PEEP & FiO2 as tolerated, maintain SpO2 > 88% - Head of bed elevated 30 degrees, VAP protocol in place - Plateau pressures less than 30 cm H20  - Intermittent chest x-ray & ABG PRN - Ensure adequate pulmonary hygiene  -will perform SAT/SBT when respiratory parameters are met   Severe Sepsis in setting of COVID-19 Pneumonia, suspected superimposed bacterial pneumonia, & UTI PMHx: Pseudomonas and ESBL E. 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 Akinesis/dyskinesis of distal 2/3 of RV with aneurysmal dilation in mid region, RV sytolic function moderately reduced, moderate Aortic regurgitation -Maintain MAP >65 -Vasopressors as needed to maintain MAP goal  -HS Troponin peaked at 3,734 -Diuresis as BP and renal function permits ~ hold Lasix currently negative 9L -Seen by Cardiology earlier in course, signed off  on 8/25 , re-consulted on 8/30 to evaluate for severe LV dysfunction~ continue ASA and statin, Restarted GDMT due to BP and HR , holding due to hypotension -Discussed with on call Cardiologist Dr. Juliann Pares who agrees with holding Amiodarone and GDMTs for now   NEUROLOGY ACUTE METABOLIC ENCEPHALOPATHY Sepsis and COVID -need for sedation -Goal RASS -2 to -3 CT Head negative for acute intracranial abnormality on presentation -Treatment of sepsis and metabolic derangements as outlined above -Provide supportive care -Promote normal sleep/wake cycle and family presence -PRN Morphine, -Unable to start seroquel due to QTC prolongation -Avoid Benzo's cause paradoxical reaction with worsening agitation. ON PRECEDEX NOW  Acute DVT DVT found in LLE on [place] 04/22/2023.  -Continue eliquis 5 mg twice daily If signs of bleeding, recommend IVC filter   Best Practice (right click and "Reselect all SmartList Selections" daily)   Diet/type: Regular consistency (see orders) DVT prophylaxis: DOAC GI prophylaxis: N/A Lines: N/A Foley:  Yes, and it is still needed Code Status:  full code Last date of multidisciplinary goals of care discussion [8/29]    DVT/GI PRX  assessed I Assessed the  need for Labs I Assessed the need for Foley I Assessed the need for Central Venous Line Family Discussion when available I Assessed the need for Mobilization I made an Assessment of medications to be adjusted accordingly Safety Risk assessment completed  CASE DISCUSSED IN MULTIDISCIPLINARY ROUNDS WITH ICU TEAM     Critical Care Time devoted to patient care services described in this note is 55 minutes.  Critical care was necessary to treat /prevent imminent and life-threatening deterioration. Overall, patient is critically ill, prognosis is guarded.    Lucie Leather, M.D.  [facility]  Medical Director [facility] Medical Director [facility] Cardio-Pulmonary  Department

## 2023-04-29 NOTE — Progress Notes (Signed)
PHARMACY CONSULT NOTE - ELECTROLYTES  Pharmacy Consult for Electrolyte Monitoring and Replacement   Recent Labs: Height: 5' (152.4 cm) Weight: 56.2 kg (123 lb 14.4 oz) IBW/kg (Calculated) : 45.5 Estimated Creatinine Clearance: 47.8 mL/min (by C-G formula based on SCr of 0.46 mg/dL). Potassium (mmol/L)  Date Value  04/29/2023 3.5  11/26/2013 3.8   Magnesium (mg/dL)  Date Value  96/11/5407 2.2   Calcium (mg/dL)  Date Value  81/19/1478 7.7 (L)   Calcium, Total (mg/dL)  Date Value  29/56/2130 9.5   Albumin (g/dL)  Date Value  86/57/8469 1.9 (L)  04/30/2015 4.3  11/26/2013 3.9   Phosphorus (mg/dL)  Date Value  62/95/2841 1.9 (L)   Sodium (mmol/L)  Date Value  04/29/2023 137  04/30/2015 140  11/26/2013 142   Assessment  Shelia Zaylani Sherman is a 77 y.o. female presenting with Severe sepsis secondary to pneumonia. PMH significant for CVA, Anemia, and depression. Pharmacy has been consulted to monitor and replace electrolytes.  Patient was emergently intubated 9/4 and made NPO  Nutrition: Vital AF 1.2 CAL @ 50 mL/hr + free water 30 mL q4h  Goal of Therapy: Electrolytes WNL  Plan:  30 mmol potassium phosphate IV per NP Recheck electrolytes tomorrow morning  Thank you for involving pharmacy in this patient's care.   Darolyn Rua, PharmD Student [employer]

## 2023-04-29 NOTE — Plan of Care (Signed)
  Problem: Fluid Volume: Goal: Hemodynamic stability will improve Outcome: Progressing   Problem: Clinical Measurements: Goal: Signs and symptoms of infection will decrease Outcome: Progressing   Problem: Respiratory: Goal: Ability to maintain adequate ventilation will improve Outcome: Progressing   Problem: Coping: Goal: Psychosocial and spiritual needs will be supported Outcome: Progressing   Problem: Respiratory: Goal: Will maintain a patent airway Outcome: Progressing Goal: Complications related to the disease process, condition or treatment will be avoided or minimized Outcome: Progressing   Problem: Clinical Measurements: Goal: Ability to maintain clinical measurements within normal limits will improve Outcome: Progressing Goal: Diagnostic test results will improve Outcome: Progressing Goal: Respiratory complications will improve Outcome: Progressing Goal: Cardiovascular complication will be avoided Outcome: Progressing   Problem: Activity: Goal: Risk for activity intolerance will decrease Outcome: Progressing   Problem: Coping: Goal: Level of anxiety will decrease Outcome: Progressing   Problem: Elimination: Goal: Will not experience complications related to urinary retention Outcome: Progressing   Problem: Pain Managment: Goal: General experience of comfort will improve Outcome: Progressing   Problem: Safety: Goal: Ability to remain free from injury will improve Outcome: Progressing   Problem: Coping: Goal: Ability to adjust to condition or change in health will improve Outcome: Progressing   Problem: Fluid Volume: Goal: Ability to maintain a balanced intake and output will improve Outcome: Progressing   Problem: Metabolic: Goal: Ability to maintain appropriate glucose levels will improve Outcome: Progressing   Problem: Tissue Perfusion: Goal: Adequacy of tissue perfusion will improve Outcome: Progressing   Problem: Respiratory: Goal:  Ability to maintain a clear airway and adequate ventilation will improve Outcome: Progressing   Problem: Role Relationship: Goal: Method of communication will improve Outcome: Progressing

## 2023-04-29 NOTE — Progress Notes (Addendum)
Patient extubated at 1305. Tolerated well. Patient is now on 3 L of oxygen via nasal cannula. Patient's brother and daughter at bedside. Jeri Modena NP and Herbert Seta RT at bedside.  1:13 PM 04/29/2023 Bennetta Laos, RN
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
## 2023-04-30 DIAGNOSIS — R6520 Severe sepsis without septic shock: Secondary | ICD-10-CM | POA: Diagnosis not present

## 2023-04-30 DIAGNOSIS — A419 Sepsis, unspecified organism: Secondary | ICD-10-CM | POA: Diagnosis not present

## 2023-04-30 DIAGNOSIS — I5021 Acute systolic (congestive) heart failure: Secondary | ICD-10-CM | POA: Diagnosis present

## 2023-04-30 DIAGNOSIS — R7989 Other specified abnormal findings of blood chemistry: Secondary | ICD-10-CM | POA: Diagnosis present

## 2023-04-30 LAB — CBC WITH DIFFERENTIAL/PLATELET
Abs Immature Granulocytes: 0.12 10*3/uL — ABNORMAL HIGH (ref 0.00–0.07)
Basophils Absolute: 0 10*3/uL (ref 0.0–0.1)
Basophils Relative: 0 %
Eosinophils Absolute: 0 10*3/uL (ref 0.0–0.5)
Eosinophils Relative: 0 %
HCT: 24.9 % — ABNORMAL LOW (ref 36.0–46.0)
Hemoglobin: 8.4 g/dL — ABNORMAL LOW (ref 12.0–15.0)
Immature Granulocytes: 1 %
Lymphocytes Relative: 12 %
Lymphs Abs: 1.1 10*3/uL (ref 0.7–4.0)
MCH: 31.7 pg (ref 26.0–34.0)
MCHC: 33.7 g/dL (ref 30.0–36.0)
MCV: 94 fL (ref 80.0–100.0)
Monocytes Absolute: 0.7 10*3/uL (ref 0.1–1.0)
Monocytes Relative: 7 %
Neutro Abs: 7.8 10*3/uL — ABNORMAL HIGH (ref 1.7–7.7)
Neutrophils Relative %: 80 %
Platelets: 155 10*3/uL (ref 150–400)
RBC: 2.65 MIL/uL — ABNORMAL LOW (ref 3.87–5.11)
RDW: 15.4 % (ref 11.5–15.5)
WBC: 9.8 10*3/uL (ref 4.0–10.5)
nRBC: 0 % (ref 0.0–0.2)

## 2023-04-30 LAB — BASIC METABOLIC PANEL
Anion gap: 7 (ref 5–15)
BUN: 25 mg/dL — ABNORMAL HIGH (ref 8–23)
CO2: 23 mmol/L (ref 22–32)
Calcium: 8 mg/dL — ABNORMAL LOW (ref 8.9–10.3)
Chloride: 108 mmol/L (ref 98–111)
Creatinine, Ser: 0.37 mg/dL — ABNORMAL LOW (ref 0.44–1.00)
GFR, Estimated: >60 mL/min (ref >60)
Glucose, Bld: 82 mg/dL (ref 70–99)
Potassium: 3.7 mmol/L (ref 3.5–5.1)
Sodium: 138 mmol/L (ref 135–145)

## 2023-04-30 LAB — CULTURE, RESPIRATORY W GRAM STAIN: Culture: NORMAL

## 2023-04-30 LAB — HEMOGLOBIN AND HEMATOCRIT, BLOOD
HCT: 25.5 % — ABNORMAL LOW (ref 36.0–46.0)
Hemoglobin: 8.9 g/dL — ABNORMAL LOW (ref 12.0–15.0)

## 2023-04-30 LAB — GLUCOSE, CAPILLARY
Glucose-Capillary: 76 mg/dL (ref 70–99)
Glucose-Capillary: 78 mg/dL (ref 70–99)
Glucose-Capillary: 97 mg/dL (ref 70–99)
Glucose-Capillary: 160 mg/dL — ABNORMAL HIGH (ref 70–99)
Glucose-Capillary: 100 mg/dL — ABNORMAL HIGH (ref 70–99)

## 2023-04-30 LAB — MAGNESIUM: Magnesium: 2.2 mg/dL (ref 1.7–2.4)

## 2023-04-30 LAB — PHOSPHORUS: Phosphorus: 1.7 mg/dL — ABNORMAL LOW (ref 2.5–4.6)

## 2023-04-30 MED ORDER — POLYETHYLENE GLYCOL 3350 17 G PO PACK
17.0000 g | PACK | Freq: Every day | ORAL | Status: DC
  Administered 2023-05-01 – 2023-05-04 (×2): 17 g via ORAL
  Filled 2023-04-30 (×3): qty 1

## 2023-04-30 MED ORDER — POTASSIUM PHOSPHATES 15 MMOLE/5ML IV SOLN
30.0000 mmol | Freq: Once | INTRAVENOUS | Status: AC
  Administered 2023-04-30: 30 mmol via INTRAVENOUS
  Filled 2023-04-30: qty 10

## 2023-04-30 MED ORDER — ROSUVASTATIN CALCIUM 20 MG PO TABS
40.0000 mg | ORAL_TABLET | Freq: Every day | ORAL | Status: DC
  Administered 2023-04-30 – 2023-05-05 (×6): 40 mg via ORAL
  Filled 2023-04-30 (×6): qty 2

## 2023-04-30 MED ORDER — JUVEN PO PACK
1.0000 | PACK | Freq: Two times a day (BID) | ORAL | Status: DC
  Administered 2023-05-01 (×2): 1 via ORAL

## 2023-04-30 MED ORDER — ACETAMINOPHEN 650 MG RE SUPP: 650.0000 mg | Freq: Four times a day (QID) | RECTAL | Status: DC | PRN

## 2023-04-30 MED ORDER — DOCUSATE SODIUM 100 MG PO CAPS
100.0000 mg | ORAL_CAPSULE | Freq: Two times a day (BID) | ORAL | Status: DC
  Administered 2023-04-30: 100 mg via ORAL
  Filled 2023-04-30: qty 1

## 2023-04-30 MED ORDER — ALBUTEROL SULFATE HFA 108 (90 BASE) MCG/ACT IN AERS
2.0000 | INHALATION_SPRAY | Freq: Two times a day (BID) | RESPIRATORY_TRACT | Status: DC
  Administered 2023-05-01 – 2023-05-06 (×10): 2 via RESPIRATORY_TRACT
  Filled 2023-04-30: qty 6.7

## 2023-04-30 MED ORDER — K PHOS MONO-SOD PHOS DI & MONO 155-852-130 MG PO TABS
250.0000 mg | ORAL_TABLET | Freq: Every day | ORAL | Status: DC
  Filled 2023-04-30: qty 1

## 2023-04-30 MED ORDER — THIAMINE HCL 100 MG PO TABS
100.0000 mg | ORAL_TABLET | Freq: Every day | ORAL | Status: AC
  Administered 2023-05-01 – 2023-05-04 (×4): 100 mg via ORAL
  Filled 2023-04-30 (×8): qty 1

## 2023-04-30 MED ORDER — GUAIFENESIN 100 MG/5ML PO LIQD
15.0000 mL | Freq: Three times a day (TID) | ORAL | Status: DC
  Administered 2023-04-30 – 2023-05-02 (×6): 15 mL via ORAL
  Filled 2023-04-30 (×6): qty 20

## 2023-04-30 MED ORDER — VITAMIN C 500 MG PO TABS
500.0000 mg | ORAL_TABLET | Freq: Two times a day (BID) | ORAL | Status: DC
  Administered 2023-04-30 – 2023-05-06 (×11): 500 mg via ORAL
  Filled 2023-04-30 (×12): qty 1

## 2023-04-30 MED ORDER — ADULT MULTIVITAMIN W/MINERALS CH
1.0000 | ORAL_TABLET | Freq: Every day | ORAL | Status: DC
  Administered 2023-05-02 – 2023-05-06 (×4): 1 via ORAL
  Filled 2023-04-30 (×5): qty 1

## 2023-04-30 MED ORDER — ZINC SULFATE 220 (50 ZN) MG PO CAPS
220.0000 mg | ORAL_CAPSULE | Freq: Every day | ORAL | Status: AC
  Administered 2023-05-01: 220 mg via ORAL
  Filled 2023-04-30: qty 1

## 2023-04-30 MED ORDER — IPRATROPIUM-ALBUTEROL 0.5-2.5 (3) MG/3ML IN SOLN
3.0000 mL | Freq: Two times a day (BID) | RESPIRATORY_TRACT | Status: DC
  Administered 2023-04-30: 3 mL via RESPIRATORY_TRACT
  Filled 2023-04-30: qty 3

## 2023-04-30 MED ORDER — ALBUTEROL SULFATE HFA 108 (90 BASE) MCG/ACT IN AERS: 2.0000 | INHALATION_SPRAY | RESPIRATORY_TRACT | Status: DC | PRN

## 2023-04-30 MED ORDER — ACETAMINOPHEN 325 MG PO TABS
650.0000 mg | ORAL_TABLET | Freq: Four times a day (QID) | ORAL | Status: DC | PRN
  Filled 2023-04-30: qty 2

## 2023-04-30 MED ORDER — FAMOTIDINE 20 MG PO TABS
20.0000 mg | ORAL_TABLET | Freq: Every day | ORAL | Status: DC
  Administered 2023-05-01 – 2023-05-06 (×5): 20 mg via ORAL
  Filled 2023-04-30 (×6): qty 1

## 2023-04-30 MED ORDER — MELATONIN 5 MG PO TABS
5.0000 mg | ORAL_TABLET | Freq: Every day | ORAL | Status: DC
  Administered 2023-04-30 – 2023-05-05 (×6): 5 mg via ORAL
  Filled 2023-04-30 (×6): qty 1

## 2023-04-30 NOTE — Assessment & Plan Note (Addendum)
Resolved. Due to Covid Pneumonia, likely also bacterial.  Required intubation, extubated on 9/6.   Now weaned to room air. --O2 as needed --Maintain sats > 90% --Mgmt of underlying respiratory conditions as outlined. --Pulmonary hygiene

## 2023-04-30 NOTE — Progress Notes (Signed)
1809-pt transferred to room 117. Pts v/s are stable at this time. Pt has no c/o pain or discomfort noted or stated. Report was called.

## 2023-04-30 NOTE — Assessment & Plan Note (Signed)
Vascular was consulted for consideration of IVC filter. See consult note of 04/29/23.  Procedure was deferred due to goals of care and code status made DNR. --Started full dose Lovenox & Hbg steadily improving --Will transition Eliquis today --If pt does not tolerate anticoagulation, reach back out to Vascular for IVC filter placement --Monitor anemia closely - improving --Monitor for any signs of bleeding

## 2023-04-30 NOTE — Progress Notes (Signed)
PHARMACY CONSULT NOTE - ELECTROLYTES  Pharmacy Consult for Electrolyte Monitoring and Replacement   Recent Labs: Height: 5' (152.4 cm) Weight: 54.4 kg (119 lb 14.9 oz) IBW/kg (Calculated) : 45.5 Estimated Creatinine Clearance: 43.6 mL/min (A) (by C-G formula based on SCr of 0.37 mg/dL (L)). Potassium (mmol/L)  Date Value  04/30/2023 3.7  11/26/2013 3.8   Magnesium (mg/dL)  Date Value  40/98/1191 2.2   Calcium (mg/dL)  Date Value  47/82/9562 8.0 (L)   Calcium, Total (mg/dL)  Date Value  13/03/6577 9.5   Albumin (g/dL)  Date Value  46/96/2952 1.9 (L)  04/30/2015 4.3  11/26/2013 3.9   Phosphorus (mg/dL)  Date Value  84/13/2440 1.7 (L)   Sodium (mmol/L)  Date Value  04/30/2023 138  04/30/2015 140  11/26/2013 142   Assessment  Teresa Sherman is a 77 y.o. female presenting with Severe sepsis secondary to pneumonia. PMH significant for CVA, Anemia, and depression. Pharmacy has been consulted to monitor and replace electrolytes.  Patient was emergently intubated 9/4 and made NPO  Nutrition: Juven q packet BID.   Goal of Therapy: Electrolytes WNL  Plan:  Kphos 30 mmol IV x 1. Pt is also receiving Kphos tabs daily x 3 days.  F/u with AM labs.   Thank you for involving pharmacy in this patient's care.   Paschal Dopp, PharmD, BCPS

## 2023-04-30 NOTE — Assessment & Plan Note (Signed)
Resolved with replacement 9/7, 9/8 Monitor & replace PRN

## 2023-04-30 NOTE — Assessment & Plan Note (Signed)
Elevated Troponin / Demand Ischemia ?Covid-related myocarditis Echocardiogram 04/17/23: LVEF severely depressed, the proximal 1/4 of LV thickens but rest of ventricle is severely hypokinetic, concerning for Takotsubo's Cardiomyopathy. Akinesis/dyskinesis of distal 2/3 of RV with aneurysmal dilation in mid region, RV sytolic function moderately reduced, moderate Aortic regurgitation -Maintain MAP >65 --BP's stable off pressors --hs-Troponin peaked at 3,734 --Diuresis as needed, as BP and renal function permits ~ Lasix currently held  -Seen by Cardiology earlier in course, signed off on 8/25 , re-consulted on 8/30 to evaluate for severe LV dysfunction~ continue ASA and statin, Restarted GDMT due to BP and HR, now held due to hypotension --Resume GDMT therapies as tolerated
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
## 2023-04-30 NOTE — Progress Notes (Addendum)
Progress Note   Patient: Teresa Sherman ZOX:[id number] DOB: 04-26-1946 DOA: 04/11/2023     19 DOS: the patient was seen and examined on 04/30/2023   Brief hospital course: 77 y.o. female with medical history significant for Prior stroke, depression with anxiety, prior ESBL E. coli, admitted 04/11/2023 with Sepsis, Acute Metabolic Encephalopathy, and Acute Hypoxic Respiratory Failure in the setting of COVID-19 Pneumonia and suspected superimposed bacterial pneumonia.   She was found after wellness check by her niece when family was not able to reach patient for two days.  She was found somnolent and confused in her bed and had unintelligible mumbling but without focal neurologic deficits. She is reportedly oriented x 3 at baseline. She was also noted by EMS to have foul-smelling urine.  Initial Vitals: HR to 115, RR 23 with O2 sat 90 to 95% on room air, BP 101/83 improving to 126/87 with IV fluids  Significant Labs: WBC of 8.1 but lactic acid 2.4 and procalcitonin 7.73. Hemoglobin 7.8 with baseline around 13 a year ago. CMP notable for creatinine of 1.78, up from 0.51 a year ago, AST/ALT 45/23. COVID-positive. [id number].  Imaging Chest X-ray showed Right lower airspace opacity.   Pt was admitted to hospitalist service initially.   Prolonged and complicated hospital course as outlined below:  Significant Hospital Events 8/19: Admitted by [facility]. 8/21-8/27 (per [facility]): severe sepsis secondary to COVID19 pneumonia & possible co-bacterial infection. Completed IV rocephin, azithromycin x 5 days, later given Unasyn >> Zosyn.  Pt was moved to stepdown placed on BiPAP >> heated HFNC.  Discussed code status w/ pt and she wants to continue full code.  8/23: Developed hypotension and increased fiO2 requirements, received IF fluids and albumin, moved to Stepdown.  Troponin elevated, placed on Heparin gtt, Cardiology consulted 8/24: Elevated Troponin felt to be due to demand ischemia, BP too soft for  GDMT 8/25: moved back to Progressive Cardiac unit 8/26: Back to Stepdown due to AMS and removing supplemental O2 with desaturations.  8/29: Increasing FiO2 requirements (up to 90% FiO2 via HHFNC).  PCCM consulted with high risk for intubation. 8/30: Decreased HHFNC to 65%/50L. Remains delirious. Found to have LLE DVT. 8/31: did not tolerate BPAP overnight. Remains on significant HHFNC. 9/1: tolerated BPAP overnight. Down to 50%/45L. PCCM signed off to [facility] 9/4: PCCM re-consulted for increased agitation, persistent tachycardia despite Dig, metoprolol and Amiodarone, also hypotensive  despite IVFs bolus now requiring vasopressor. SEVERE HYPOXIA REQUIRING INTUBATION 9/6: Extubated 9/7: TRH resumed care of patient. On 4 L/min [place] O2.  SLP evaluated, dysphagia-1 diet started.     Assessment and Plan: * Severe sepsis (HCC) POA, resolved. Severe sepsis criteria include tachycardia and tachypnea with soft blood pressure, AKI, AMS, lactic acidosis with elevated procalcitonin Due to COVID pneumonia with secondary bacterial infection, UTI --Completed antibiotics  Pneumonia due to COVID-19 virus Right lower lobe pneumonia Completed Rocephin and azithromycin --COVID precautions --Pulmonary hygiene --O2 per protocol --Symptomatic care as needed --Remains on IV steroids - continue for now, transition to PO and taper when further improved O2 needs    CAP (community acquired pneumonia) Completed antibiotics. Suspect secondary bacterial pneumonia in setting of Covid-19  Urinary tract infection History of Pseudomonas and ESBL E. coli UTI 11/2021 Urine reportedly foul-smelling with EMS.  Urine culture insignificant growth. Completed antibiotics for pneumonia as outlined.   AKI (acute kidney injury) (HCC) Resolved.  Creatinine 1.78 on admission.  up from baseline of 0.51, likely prerenal and related to sepsis. Renal function normalized. --Monitor BMP's  Rhabdomyolysis CK near 1500 on  admission, unknown whether traumatic or not.  Patient was found on the bed. Resolved with IV hydration.  Acute metabolic encephalopathy Likely related to sepsis.   Required sedation. Mentation improving as clinical condition improves.  At baseline pt is oriented x 3 per family.  CT head negative. --Mgmt of underlying issues as outlined --Supportive care --Delirium precautions --Transfer out of ICU --Seroquel on hold due to Qtc prolongation --AVOID BENZO's due to paradoxical worsened agitation when given earlier in course of admission --Off Precedex  Anemia Uncertain acuity.  Hemoglobin 7.8 on admission, down from 13 a year ago. 9/7 - ICU RN reports pt has been having dark stools.  Anticoagulation was held and she was given 2 units pRBC's on 9/4 and 9/5.   --Monitor for bleeding signs --Daily CBC's, more frequent H&H as needed. Hbg stable at 8.4 >> 8.9 --Resume anticoagulation if further improvement and no bleeding  Hypophosphatemia Phos 1.7 being replaced (9/7) Monitor & replace PRN  Acute HFrEF (heart failure with reduced ejection fraction) (HCC) Elevated Troponin / Demand Ischemia ?Covid-related myocarditis Echocardiogram 04/17/23: LVEF severely depressed, the proximal 1/4 of LV thickens but rest of ventricle is severely hypokinetic, concerning for Takotsubo's Cardiomyopathy. Akinesis/dyskinesis of distal 2/3 of RV with aneurysmal dilation in mid region, RV sytolic function moderately reduced, moderate Aortic regurgitation -Maintain MAP >65 --BP's stable off pressors --hs-Troponin peaked at 3,734 --Diuresis as needed, as BP and renal function permits ~ Lasix currently held  -Seen by Cardiology earlier in course, signed off on 8/25 , re-consulted on 8/30 to evaluate for severe LV dysfunction~ continue ASA and statin, Restarted GDMT due to BP and HR, now held due to hypotension --Resume GDMT therapies as tolerated   Acute deep vein thrombosis (DVT) of left peroneal vein  [facility]) Vascular was consulted for consideration of IVC filter. See consult note of 04/29/23.  Procedure was deferred due to goals of care and code status made DNR.  Sepsis with acute hypoxic respiratory failure without septic shock (HCC) POA, now resolved.  Acute on chronic respiratory failure with hypoxia (HCC) Due to Covid Pneumonia, likely also bacterial.  Required intubation, extubated on 9/6.  Now on nasal cannula O2 at 4 L/min. --Wean O2 as tolerated --Maintain sats > 90% --Mgmt of underlying respiratory conditions as outlined. --Pulmonary hygiene  History of stroke Holding ASA and statin due to anemia requiring transfusion, resume when appropriate.  Depression with anxiety Insomnia Home trazodone on hold due to encephalopathy.  Resume when appropriate.        Subjective: Pt seen in ICU this AM, awake resting in bed.  She is tearful and begging to be sent home today.  States she wants to see her grandkids.  She perseverates on going home and adamantly denies having any bothersome symptoms.  Niece was outside the room, updated after encounter. She reports pt's grandchildren are grown and pt rarely sees them due to estranged from her daughter.    Physical Exam: Vitals:   04/30/23 1500 04/30/23 1530 04/30/23 1600 04/30/23 1700  BP: 121/66  127/74 129/72  Pulse: 91  94 84  Resp: (!) 23  (!) 25 (!) 27  Temp:  (!) 97.4 F (36.3 C)    TempSrc:  Axillary    SpO2: 90%  92% 91%  Weight:      Height:       General exam: awake, alert, no acute distress, frail and chronically ill appearing HEENT: nasal cannula in place, moist mucus membranes, hearing  grossly normal  Respiratory system: referred coarse secretion sounds, low volume inspirations, no wheezes, wet-sounding cough, normal respiratory effort, on 4 L/min [place] 02. Cardiovascular system: normal S1/S2, tachycardic, regular rhythm, +JVP, no pedal edema.   Gastrointestinal system: soft, NT, ND, no HSM felt, +bowel sounds. Central  nervous system: A&O x self hospital. no gross focal neurologic deficits, normal speech Extremities: moves all, no edema, normal tone Skin: dry, intact, normal temperature Psychiatry: normal mood, congruent affect, abnormal judgement and insight    Data Reviewed:  Notable labs --- BUN 25, Cr 0.37, Ca 8.0, phos 1.7 Hbg 8.4 >> 8.9  Family Communication: Niece present on rounds today  Disposition: Status is: Inpatient Remains inpatient appropriate because: severity of illness   Planned Discharge Destination: Skilled nursing facility    Time spent: 55 minutes  Author: Pennie Banter, DO 04/30/2023 6:00 PM  For on call review [email or web address].

## 2023-04-30 NOTE — Assessment & Plan Note (Signed)
POA, now resolved.

## 2023-04-30 NOTE — Hospital Course (Addendum)
77 y.o. female with medical history significant for Prior stroke, depression with anxiety, prior ESBL E. coli, admitted 04/11/2023 with Sepsis, Acute Metabolic Encephalopathy, and Acute Hypoxic Respiratory Failure in the setting of COVID-19 Pneumonia and suspected superimposed bacterial pneumonia.   She was found after wellness check by her niece when family was not able to reach patient for two days.  She was found somnolent and confused in her bed and had unintelligible mumbling but without focal neurologic deficits. She is reportedly oriented x 3 at baseline. She was also noted by EMS to have foul-smelling urine.  Initial Vitals: HR to 115, RR 23 with O2 sat 90 to 95% on room air, BP 101/83 improving to 126/87 with IV fluids  Significant Labs: WBC of 8.1 but lactic acid 2.4 and procalcitonin 7.73. Hemoglobin 7.8 with baseline around 13 a year ago. CMP notable for creatinine of 1.78, up from 0.51 a year ago, AST/ALT 45/23. COVID-positive. [id number].  Imaging Chest X-ray showed Right lower airspace opacity.   Pt was admitted to hospitalist service initially.   Prolonged and complicated hospital course as outlined below:  Significant Hospital Events 8/19: Admitted by [facility]. 8/21-8/27 (per Hospit[facility]): severe sepsis secondary to COVID19 pneumonia & possible co-bacterial infection. Completed IV rocephin, azithromycin x 5 days, later given Unasyn >> Zosyn.  Pt was moved to stepdown placed on BiPAP >> heated HFNC.  Discussed code status w/ pt and she wants to continue full code.  8/23: Developed hypotension and increased fiO2 requirements, received IF fluids and albumin, moved to Stepdown.  Troponin elevated, placed on Heparin gtt, Cardiology consulted 8/24: Elevated Troponin felt to be due to demand ischemia, BP too soft for GDMT 8/25: moved back to Progressive Cardiac unit 8/26: Back to Stepdown due to AMS and removing supplemental O2 with desaturations.  8/29: Increasing FiO2 requirements (up to 90% FiO2  via HHFNC).  PCCM consulted with high risk for intubation. 8/30: Decreased HHFNC to 65%/50L. Remains delirious. Found to have LLE DVT. 8/31: did not tolerate BPAP overnight. Remains on significant HHFNC. 9/1: tolerated BPAP overnight. Down to 50%/45L. PCCM signed off to [facility] 9/4: PCCM re-consulted for increased agitation, persistent tachycardia despite Dig, metoprolol and Amiodarone, also hypotensive  despite IVFs bolus now requiring vasopressor. SEVERE HYPOXIA REQUIRING INTUBATION 9/6: Extubated 9/7: TRH resumed care of patient. On 4 L/min [place] O2.  SLP evaluated, dysphagia-1 diet started.   9/8 - weaned to room air, placed back on 2 L/min O2 due to borderline O2 sats.  PT/OT evaluations. Pt hallucinating.

## 2023-04-30 NOTE — Assessment & Plan Note (Signed)
Completed antibiotics. Suspect secondary bacterial pneumonia in setting of Covid-19

## 2023-05-01 DIAGNOSIS — R531 Weakness: Secondary | ICD-10-CM

## 2023-05-01 DIAGNOSIS — F0390 Unspecified dementia without behavioral disturbance: Secondary | ICD-10-CM | POA: Diagnosis present

## 2023-05-01 DIAGNOSIS — A419 Sepsis, unspecified organism: Secondary | ICD-10-CM | POA: Diagnosis not present

## 2023-05-01 DIAGNOSIS — R6520 Severe sepsis without septic shock: Secondary | ICD-10-CM | POA: Diagnosis not present

## 2023-05-01 LAB — GLUCOSE, CAPILLARY
Glucose-Capillary: 86 mg/dL (ref 70–99)
Glucose-Capillary: 82 mg/dL (ref 70–99)
Glucose-Capillary: 165 mg/dL — ABNORMAL HIGH (ref 70–99)
Glucose-Capillary: 108 mg/dL — ABNORMAL HIGH (ref 70–99)
Glucose-Capillary: 91 mg/dL (ref 70–99)

## 2023-05-01 LAB — CBC WITH DIFFERENTIAL/PLATELET
Abs Immature Granulocytes: 0.15 10*3/uL — ABNORMAL HIGH (ref 0.00–0.07)
Basophils Absolute: 0 10*3/uL (ref 0.0–0.1)
Basophils Relative: 0 %
Eosinophils Absolute: 0 10*3/uL (ref 0.0–0.5)
Eosinophils Relative: 0 %
HCT: 28.3 % — ABNORMAL LOW (ref 36.0–46.0)
Hemoglobin: 9.7 g/dL — ABNORMAL LOW (ref 12.0–15.0)
Immature Granulocytes: 1 %
Lymphocytes Relative: 12 %
Lymphs Abs: 1.4 10*3/uL (ref 0.7–4.0)
MCH: 31.9 pg (ref 26.0–34.0)
MCHC: 34.3 g/dL (ref 30.0–36.0)
MCV: 93.1 fL (ref 80.0–100.0)
Monocytes Absolute: 0.7 10*3/uL (ref 0.1–1.0)
Monocytes Relative: 7 %
Neutro Abs: 8.7 10*3/uL — ABNORMAL HIGH (ref 1.7–7.7)
Neutrophils Relative %: 80 %
Platelets: 191 10*3/uL (ref 150–400)
RBC: 3.04 MIL/uL — ABNORMAL LOW (ref 3.87–5.11)
RDW: 15.6 % — ABNORMAL HIGH (ref 11.5–15.5)
WBC: 11 10*3/uL — ABNORMAL HIGH (ref 4.0–10.5)
nRBC: 0.2 % (ref 0.0–0.2)

## 2023-05-01 LAB — BASIC METABOLIC PANEL
Anion gap: 9 (ref 5–15)
BUN: 15 mg/dL (ref 8–23)
CO2: 22 mmol/L (ref 22–32)
Calcium: 8.1 mg/dL — ABNORMAL LOW (ref 8.9–10.3)
Chloride: 107 mmol/L (ref 98–111)
Creatinine, Ser: 0.32 mg/dL — ABNORMAL LOW (ref 0.44–1.00)
GFR, Estimated: >60 mL/min (ref >60)
Glucose, Bld: 83 mg/dL (ref 70–99)
Potassium: 3.4 mmol/L — ABNORMAL LOW (ref 3.5–5.1)
Sodium: 138 mmol/L (ref 135–145)

## 2023-05-01 LAB — PHOSPHORUS: Phosphorus: 2 mg/dL — ABNORMAL LOW (ref 2.5–4.6)

## 2023-05-01 LAB — MAGNESIUM: Magnesium: 2.1 mg/dL (ref 1.7–2.4)

## 2023-05-01 MED ORDER — ENOXAPARIN SODIUM 60 MG/0.6ML IJ SOSY
1.0000 mg/kg | PREFILLED_SYRINGE | Freq: Two times a day (BID) | INTRAMUSCULAR | Status: DC
  Administered 2023-05-01 – 2023-05-03 (×5): 55 mg via SUBCUTANEOUS
  Filled 2023-05-01 (×5): qty 0.6

## 2023-05-01 MED ORDER — DOCUSATE SODIUM 50 MG/5ML PO LIQD
100.0000 mg | Freq: Two times a day (BID) | ORAL | Status: DC
  Administered 2023-05-01 – 2023-05-05 (×6): 100 mg via ORAL
  Filled 2023-05-01 (×12): qty 10

## 2023-05-01 MED ORDER — POTASSIUM CHLORIDE CRYS ER 20 MEQ PO TBCR
40.0000 meq | EXTENDED_RELEASE_TABLET | Freq: Once | ORAL | Status: AC
  Administered 2023-05-01: 40 meq via ORAL
  Filled 2023-05-01: qty 2

## 2023-05-01 MED ORDER — K PHOS MONO-SOD PHOS DI & MONO 155-852-130 MG PO TABS
500.0000 mg | ORAL_TABLET | ORAL | Status: AC
  Administered 2023-05-01 (×2): 500 mg via ORAL
  Filled 2023-05-01 (×3): qty 2

## 2023-05-01 MED ORDER — TRAZODONE HCL 50 MG PO TABS
25.0000 mg | ORAL_TABLET | Freq: Every evening | ORAL | Status: DC | PRN
  Administered 2023-05-01: 25 mg via ORAL
  Filled 2023-05-01: qty 1

## 2023-05-01 MED ORDER — DONEPEZIL HCL 5 MG PO TABS
10.0000 mg | ORAL_TABLET | Freq: Every day | ORAL | Status: DC
  Administered 2023-05-01 – 2023-05-05 (×5): 10 mg via ORAL
  Filled 2023-05-01 (×5): qty 2

## 2023-05-01 MED ORDER — ASPIRIN 81 MG PO CHEW
81.0000 mg | CHEWABLE_TABLET | Freq: Every day | ORAL | Status: DC
  Administered 2023-05-01 – 2023-05-06 (×5): 81 mg via ORAL
  Filled 2023-05-01 (×6): qty 1

## 2023-05-01 NOTE — Progress Notes (Signed)
Speech Language Pathology Treatment: Dysphagia  Patient Details Name: Teresa Sherman MRN: [id number] DOB: August 22, 1946 Today's Date: 05/01/2023 Time: 4098-1191 SLP Time Calculation (min) (ACUTE ONLY): 10 min  Assessment / Plan / Recommendation Clinical Impression  Pt seen for diet tolerance and trials of upgraded textures. Pt alert, confused. Visually hallucinating. Cleared with RN who assisted with repositioning. Pt presents with s/sx mild oral dysphagia c/b prolonged mastication and trace lingual residual with solids which clears with liquid wash. Of note, no overt s/sx pharyngeal dysphagia; however, pt with baseline coughing prior to POs. Recommend diet upgraded to Dysphagia 2 with Thin Liquids and safe swallowing strategies/aspiration precautions as outlined below. Pt is at increased risk of aspiration/aspiration given AMS, need for assistance with feeding, and respiratory status. SLP to f/u per POC for diet tolerance.   HPI HPI: Pt is a 77 y.o. female with medical history significant for prior stroke, depression with anxiety, prior ESBL E. coli, who presents to the emergency room by EMS with altered mental status.  She was found after wellness check was requested after she had not been seen in 2 days.  She was found somnolent and confused in her bed and had unintelligible mumbling but without focal neurologic deficits.  She is reportedly oriented x 3 at baseline.  She was also noted by EMS to have foul-smelling urine ED course and data review:  COVID-positive.   Per her history, she was previously diagnosed with Mild Cognitive Impairment(MCI) but is able to speak in function with minimal issues.  She does occasionally display emotional lability and say inappropriate things at times however these have been ongoing issues.   CXR this admit: There has developed extensive airspace infiltrate throughout the  right lung in keeping with progressive pneumonic infiltrate. Left  lung is clear. No  pneumothorax or pleural effusion. Cardiac size  within normal limits. Pulmonary vascularity is normal.      SLP Plan  Continue with current plan of care      Recommendations for follow up therapy are one component of a multi-disciplinary discharge planning process, led by the attending physician.  Recommendations may be updated based on patient status, additional functional criteria and insurance authorization.    Recommendations  Diet recommendations: Dysphagia 2 (fine chop);Thin liquid Liquids provided via: Cup;Straw Medication Administration: Crushed with puree Supervision: Intermittent supervision to cue for compensatory strategies;Staff to assist with self feeding;Patient able to self feed Compensations: Minimize environmental distractions;Slow rate;Small sips/bites;Follow solids with liquid Postural Changes and/or Swallow Maneuvers: Seated upright 90 degrees;Upright 30-60 min after meal                  Oral care BID;Oral care before and after PO;Staff/trained caregiver to provide oral care   Frequent or constant Supervision/Assistance Dysphagia, pharyngeal phase (R13.13)     Continue with current plan of care    Clyde Canterbury, M.S., CCC-SLP Speech-Language Pathologist [facility] [phone] Arnette Felts)  Woodroe Chen  05/01/2023, 11:50 AM

## 2023-05-01 NOTE — Progress Notes (Signed)
Progress Note   Patient: Teresa Sherman [id number] DOB: July 30, 1946 DOA: 04/11/2023     20 DOS: the patient was seen and examined on 05/01/2023   Brief hospital course: 77 y.o. female with medical history significant for Prior stroke, depression with anxiety, prior ESBL E. coli, admitted 04/11/2023 with Sepsis, Acute Metabolic Encephalopathy, and Acute Hypoxic Respiratory Failure in the setting of COVID-19 Pneumonia and suspected superimposed bacterial pneumonia.   She was found after wellness check by her niece when family was not able to reach patient for two days.  She was found somnolent and confused in her bed and had unintelligible mumbling but without focal neurologic deficits. She is reportedly oriented x 3 at baseline. She was also noted by EMS to have foul-smelling urine.  Initial Vitals: HR to 115, RR 23 with O2 sat 90 to 95% on room air, BP 101/83 improving to 126/87 with IV fluids  Significant Labs: WBC of 8.1 but lactic acid 2.4 and procalcitonin 7.73. Hemoglobin 7.8 with baseline around 13 a year ago. CMP notable for creatinine of 1.78, up from 0.51 a year ago, AST/ALT 45/23. COVID-positive. [id number].  Imaging Chest X-ray showed Right lower airspace opacity.   Pt was admitted to hospitalist service initially.   Prolonged and complicated hospital course as outlined below:  Significant Hospital Events 8/19: Admitted by [facility]. 8/21-8/27 (per Hospit[facility]): severe sepsis secondary to COVID19 pneumonia & possible co-bacterial infection. Completed IV rocephin, azithromycin x 5 days, later given Unasyn >> Zosyn.  Pt was moved to stepdown placed on BiPAP >> heated HFNC.  Discussed code status w/ pt and she wants to continue full code.  8/23: Developed hypotension and increased fiO2 requirements, received IF fluids and albumin, moved to Stepdown.  Troponin elevated, placed on Heparin gtt, Cardiology consulted 8/24: Elevated Troponin felt to be due to demand ischemia, BP too soft for  GDMT 8/25: moved back to Progressive Cardiac unit 8/26: Back to Stepdown due to AMS and removing supplemental O2 with desaturations.  8/29: Increasing FiO2 requirements (up to 90% FiO2 via HHFNC).  PCCM consulted with high risk for intubation. 8/30: Decreased HHFNC to 65%/50L. Remains delirious. Found to have LLE DVT. 8/31: did not tolerate BPAP overnight. Remains on significant HHFNC. 9/1: tolerated BPAP overnight. Down to 50%/45L. PCCM signed off to [facility] 9/4: PCCM re-consulted for increased agitation, persistent tachycardia despite Dig, metoprolol and Amiodarone, also hypotensive  despite IVFs bolus now requiring vasopressor. SEVERE HYPOXIA REQUIRING INTUBATION 9/6: Extubated 9/7: TRH resumed care of patient. On 4 L/min [place] O2.  SLP evaluated, dysphagia-1 diet started.   9/8 - weaned to room air, placed back on 2 L/min O2 due to borderline O2 sats.  PT/OT evaluations. Pt hallucinating.   Assessment and Plan: * Severe sepsis (HCC) POA, resolved. Severe sepsis criteria include tachycardia and tachypnea with soft blood pressure, AKI, AMS, lactic acidosis with elevated procalcitonin Due to COVID pneumonia with secondary bacterial infection, UTI --Completed antibiotics  Acute deep vein thrombosis (DVT) of left peroneal vein [facility]) Vascular was consulted for consideration of IVC filter. See consult note of 04/29/23.  Procedure was deferred due to goals of care and code status made DNR. --Start full dose Lovenox --If pt does not tolerate anticoagulation, will reach back out to Vascular for IVC filter placement --Monitor anemia closely --Monitor for any signs of bleeding  Acute on chronic respiratory failure with hypoxia (HCC) Due to Covid Pneumonia, likely also bacterial.  Required intubation, extubated on 9/6.  Now on nasal cannula O2 at 4  L/min. --Wean O2 as tolerated --Maintain sats > 90% --Mgmt of underlying respiratory conditions as outlined. --Pulmonary hygiene  Acute metabolic  encephalopathy Likely related to sepsis.   Required sedation. Mentation improving as clinical condition improves.  At baseline pt is oriented x 3 per family.  CT head negative. --Mgmt of underlying issues as outlined --Supportive care --Delirium precautions --Resume trazodone at bedtime PRN --AVOID BENZO's due to paradoxical worsened agitation when given earlier in course of admission --Off Precedex  Pneumonia due to COVID-19 virus Right lower lobe pneumonia Completed Rocephin and azithromycin --Off COVID precautions --Pulmonary hygiene --O2 per protocol --Symptomatic care as needed --Remains on IV steroids 10 mg through 9/11  CAP (community acquired pneumonia) Completed antibiotics. Suspect secondary bacterial pneumonia in setting of Covid-19  Hypophosphatemia Phos 1.7 replaced on 9/7 Phos 2.0 today - further replacement ordered Monitor & replace PRN  Acute HFrEF (heart failure with reduced ejection fraction) (HCC) Elevated Troponin / Demand Ischemia ?Covid-related myocarditis Echocardiogram 04/17/23: LVEF severely depressed, the proximal 1/4 of LV thickens but rest of ventricle is severely hypokinetic, concerning for Takotsubo's Cardiomyopathy. Akinesis/dyskinesis of distal 2/3 of RV with aneurysmal dilation in mid region, RV sytolic function moderately reduced, moderate Aortic regurgitation -Maintain MAP >65 --BP's stable off pressors --hs-Troponin peaked at 3,734 --Diuresis as needed, as BP and renal function permits ~ Lasix currently held  -Seen by Cardiology earlier in course, signed off on 8/25 , re-consulted on 8/30 to evaluate for severe LV dysfunction~ continue ASA and statin, Restarted GDMT due to BP and HR, now held due to hypotension --Resume GDMT therapies as tolerated   Anemia Uncertain acuity.  Hemoglobin 7.8 on admission, down from 13 a year ago. 9/7 - ICU RN reports pt has been having dark stools.  Anticoagulation was held and she was given 2 units pRBC's  on 9/4 and 9/5.   --Monitor for bleeding signs --Daily CBC's, more frequent H&H as needed. Hbg stable at 8.4 >> 8.9 --Resume anticoagulation if further improvement and no bleeding  AKI (acute kidney injury) (HCC) Resolved.  Creatinine 1.78 on admission.  up from baseline of 0.51, likely prerenal and related to sepsis. Renal function normalized. --Monitor BMP's  History of stroke ASA was held and due to anemia requiring transfusion, resume. Continue statin.  Depression with anxiety Insomnia Resume home trazodone at bedtime PRN  Urinary tract infection History of Pseudomonas and ESBL E. coli UTI 11/2021 Urine reportedly foul-smelling with EMS.  Urine culture insignificant growth. Completed antibiotics for pneumonia as outlined.   Rhabdomyolysis CK near 1500 on admission, unknown whether traumatic or not.  Patient was found on the bed. Resolved with IV hydration.  Generalized weakness Due to prolonged & complicated hospitalization. --PT/OT evaluations --Fall precautions  Dementia (HCC) Resume Aricept Delirium precautions AVOID BENZO's  Sepsis with acute hypoxic respiratory failure without septic shock (HCC) POA, now resolved.        Subjective: Pt seen awake resting in bed, laying partly sideways.  She begs me to send her home and to "help me".  She is having hallucinations, pointing up to ceiling asks if I see that, stating there's a broken dish.  She seems to be having paranoid delusions but was reassured when I told her she is safe here.  She perseverates on going home, similar to yesterday.  Bedside RN reported to me and SLP patient having a lot of coughing with breakfast this AM.   Physical Exam: Vitals:   05/01/23 0123 05/01/23 0356 05/01/23 0500 05/01/23 0836  BP:  134/88 (!) 149/84  131/64  Pulse: 89 87  80  Resp: 18 16  16   Temp: 98 F (36.7 C) 98 F (36.7 C)  (!) 97.5 F (36.4 C)  TempSrc:  Oral    SpO2: 92% 96%  91%  Weight:   55.5 kg   Height:        General exam: awake, alert, no acute distress, confused, frail and chronically ill appearing HEENT: moist mucus membranes, hearing grossly normal  Respiratory system: lungs clear with diminished bases, low volume inspirations, no wheezes, wet-sounding cough, normal respiratory effort, on 2 L/min  02. Cardiovascular system: normal S1/S2, RRR, no pedal edema.   Gastrointestinal system: soft, NT, ND, no HSM felt, +bowel sounds. Central nervous system: A&O x self and hospital. no gross focal neurologic deficits, normal speech Extremities: moves all, no edema, normal tone Skin: dry, intact, normal temperature Psychiatry: anxious mood, congruent affect, abnormal judgement and insight, visual hallucinations noted (points to ceiling asking if I see "that broken dish")   Data Reviewed:  Notable labs --- K 3.4, Cr 0.32, Ca 8.1, phos 1.7 >> 2.0 Hbg 8.4 >> 8.9 >> 9.7 Hbg 9.8 >> 11.0   Micro:  Urine culture 9/4 - insignificant growth Blood cultures 9/5 - negative to date Sputum culture - normal respiratory flora  C-diff negative 04/13/23 GI panel negative 04/13/23  Blood cultures 8/19 -- +Staph hominis likely contaminant Repeat blood cultures 8/21 - negative & final  Covid-19 POSITIVE on 04/11/23    Family Communication: Niece present on rounds 9/7.    Disposition: Status is: Inpatient Remains inpatient appropriate because: severity of illness as above.    Planned Discharge Destination: Skilled nursing facility    Time spent: 45 minutes  Author: Pennie Banter, DO 05/01/2023 1:37 PM  For on call review [email or web address].

## 2023-05-01 NOTE — Progress Notes (Addendum)
PHARMACY CONSULT NOTE - ELECTROLYTES  Pharmacy Consult for Electrolyte Monitoring and Replacement   Recent Labs: Height: 5' (152.4 cm) Weight: 55.5 kg (122 lb 5.7 oz) IBW/kg (Calculated) : 45.5 Estimated Creatinine Clearance: 47.5 mL/min (A) (by C-G formula based on SCr of 0.32 mg/dL (L)). Potassium (mmol/L)  Date Value  05/01/2023 3.4 (L)  11/26/2013 3.8   Magnesium (mg/dL)  Date Value  60/45/4098 2.1   Calcium (mg/dL)  Date Value  11/91/4782 8.1 (L)   Calcium, Total (mg/dL)  Date Value  95/62/1308 9.5   Albumin (g/dL)  Date Value  65/78/4696 1.9 (L)  04/30/2015 4.3  11/26/2013 3.9   Phosphorus (mg/dL)  Date Value  29/52/8413 2.0 (L)   Sodium (mmol/L)  Date Value  05/01/2023 138  04/30/2015 140  11/26/2013 142   Assessment  Teresa Sherman is a 77 y.o. female presenting with Severe sepsis secondary to pneumonia. PMH significant for CVA, Anemia, and depression. Pharmacy has been consulted to monitor and replace electrolytes.  Patient was emergently intubated 9/4 and made NPO  Nutrition: Juven q packet BID.   Goal of Therapy: Electrolytes WNL  Plan:  Kphos tab 2 tab x 2. Kcl 40 mEq x 1.  F/u with AM labs. .    Thank you for involving pharmacy in this patient's care.   Paschal Dopp, PharmD, BCPS

## 2023-05-01 NOTE — TOC Progression Note (Signed)
Transition of Care [facility]) - Progression Note    Patient Details  Name: Teresa Sherman MRN: [id number] Date of Birth: Jun 28, 1946  Transition of Care [facility]) CM/SW Contact  Liliana Cline, LCSW Phone Number: 05/01/2023, 2:58 PM  Clinical Narrative:    PT now recommends CIR. TOC to follow for CIR evaluation.        Expected Discharge Plan and Services                                               Social Determinants of Health (SDOH) Interventions SDOH Screenings   Food Insecurity: No Food Insecurity (04/13/2023)  Housing: Low Risk  (04/13/2023)  Transportation Needs: No Transportation Needs (04/13/2023)  Utilities: Not At Risk (04/13/2023)  Financial Resource Strain: Low Risk  (06/14/2021)   Received from [facility], [facility]  Physical Activity: Sufficiently Active (10/30/2021)   Received from [facility]  Social Connections: Socially Isolated (10/30/2021)   Received from [facility]  Stress: No Stress Concern Present (10/30/2021)   Received from [facility]  Tobacco Use: Low Risk  (04/11/2023)  Health Literacy: Medium Risk (10/30/2021)   Received from [facility]    Readmission Risk Interventions     No data to display

## 2023-05-01 NOTE — Assessment & Plan Note (Signed)
Due to prolonged & complicated hospitalization. --PT/OT evaluations --Fall precautions

## 2023-05-01 NOTE — Progress Notes (Signed)
Inpatient Rehab Admissions Coordinator Note:   Per PT patient was screened for CIR candidacy by Aniela Caniglia Luvenia Starch, CCC-SLP. At this time, pt appears to be a potential candidate for CIR. I will place an order for rehab consult for full assessment, per our protocol.  Please contact me any with questions.Wolfgang Phoenix, MS, CCC-SLP Admissions Coordinator [phone] 05/01/23 4:11 PM

## 2023-05-01 NOTE — Assessment & Plan Note (Signed)
Resume Aricept Delirium precautions AVOID BENZO's

## 2023-05-01 NOTE — Progress Notes (Signed)
OT Cancellation Note  Patient Details Name: Teresa Sherman MRN: [id number] DOB: 04-06-1946   Cancelled Treatment:    Reason Eval/Treat Not Completed: Other (comment). Pt receiving care upon attempt this afternoon, unavailable for OT re-evaluation. Will re-attempt at later date/time as pt is available.  Arman Filter., MPH, MS, OTR/L ascom [phone] 05/01/23, 3:31 PM

## 2023-05-01 NOTE — Evaluation (Addendum)
Physical Therapy Evaluation Patient Details Name: Teresa Sherman MRN: [id number] DOB: 01-Mar-1946 Today's Date: 05/01/2023  History of Present Illness  Pt is a 77 y/o F admitted on 04/11/23 for sepsis, acute metabolic encephalopathy, acute hypoxic respiratory failure in the setting of COVID-19 PNA & suspected superimposed bacterial PNA. Pt found to have LLE DVT on 04/22/23, intubated on 04/27/23, extubated 04/29/23. PMH: stroke, depression, anxiety  Clinical Impression  Pt seen for PT evaluation with pt agreeable to tx. Pt is pleasant but confused throughout session, poor ability to follow simple commands during session. Pt requires assistance for supine>sit, max assist for STS with RW & stand pivot bed>recliner. Pt with poor ability to upright posture in standing, difficulty with sequencing transfer. Recommend ongoing PT services to maximize independence with mobility & reduce fall risk.    Addendum: MD cleared pt for participation with recommendation of holding PT evaluation until a few hours post anticoagulation, PT did this.      If plan is discharge home, recommend the following: Assistance with cooking/housework;Direct supervision/assist for medications management;Direct supervision/assist for financial management;Assist for transportation;Help with stairs or ramp for entrance;Supervision due to cognitive status;Two people to help with walking and/or transfers;Two people to help with bathing/dressing/bathroom   Can travel by private vehicle   No    Equipment Recommendations None recommended by PT (defer to next venue)  Recommendations for Other Services       Functional Status Assessment Patient has had a recent decline in their functional status and demonstrates the ability to make significant improvements in function in a reasonable and predictable amount of time.     Precautions / Restrictions Precautions Precautions: Fall Restrictions Weight Bearing Restrictions: No       Mobility  Bed Mobility Overal bed mobility: Needs Assistance Bed Mobility: Supine to Sit     Supine to sit: Mod assist, HOB elevated, Used rails          Transfers Overall transfer level: Needs assistance Equipment used: Rolling walker (2 wheels) Transfers: Sit to/from Stand, Bed to chair/wheelchair/BSC Sit to Stand: Max assist Stand pivot transfers: Max assist (cuing for hand placement & sequencing but pt requires significant help to pivot)         General transfer comment: STS from EOB with RW & max assist, max cuing for hand placement, powering up to standing, upright posture (but pt still with trunk flexion & unable to shift pelvis anteriorly).    Ambulation/Gait                  Stairs            Wheelchair Mobility     Tilt Bed    Modified Rankin (Stroke Patients Only)       Balance Overall balance assessment: Needs assistance Sitting-balance support: Feet supported, Bilateral upper extremity supported Sitting balance-Leahy Scale: Poor Sitting balance - Comments: close supervision for static sitting, posterior LOB when attmepting to perform BLE LAQ with poor righting reactions   Standing balance support: Bilateral upper extremity supported, Reliant on assistive device for balance Standing balance-Leahy Scale: Zero                               Pertinent Vitals/Pain Pain Assessment Pain Assessment: Faces Faces Pain Scale: No hurt    Home Living Family/patient expects to be discharged to:: Private residence Living Arrangements: Alone Available Help at Discharge: Family;Available PRN/intermittently Type of Home: House  Entrance Stairs-Number of Steps: 1   Home Layout: One level   Additional Comments: all information from previous chart entry    Prior Function Prior Level of Function : Patient poor historian/Family not available                     Extremity/Trunk Assessment   Upper Extremity  Assessment Upper Extremity Assessment: Generalized weakness    Lower Extremity Assessment Lower Extremity Assessment: Generalized weakness       Communication   Communication Communication: Difficulty following commands/understanding;Hearing impairment Following commands: Follows one step commands with increased time;Follows one step commands inconsistently Cueing Techniques: Verbal cues;Gestural cues;Tactile cues;Visual cues  Cognition Arousal: Alert Behavior During Therapy: WFL for tasks assessed/performed Overall Cognitive Status: Impaired/Different from baseline Area of Impairment: Orientation, Attention, Memory, Following commands, Awareness, Safety/judgement, Problem solving                 Orientation Level:  (oriented to month & year, location, situation but when asked her age she is only able to state her birthday but not current age)   Memory: Decreased recall of precautions, Decreased short-term memory Following Commands: Follows one step commands inconsistently, Follows one step commands with increased time Safety/Judgement: Decreased awareness of safety, Decreased awareness of deficits Awareness: Intellectual Problem Solving: Slow processing, Difficulty sequencing, Requires verbal cues, Requires tactile cues          General Comments General comments (skin integrity, edema, etc.): Pt with c/o lightheadedness once sitting EOB but reports it quickly goes away, BP in LUE in recliner at end of session: 132/78 mmHg MAP 92, SPO2 >90% on 2L/min via nasal cannula    Exercises General Exercises - Lower Extremity Long Arc Quad: AAROM, Seated, Strengthening, Both, 10 reps   Assessment/Plan    PT Assessment Patient needs continued PT services  PT Problem List Decreased strength;Decreased activity tolerance;Decreased balance;Decreased mobility;Decreased cognition;Decreased knowledge of use of DME;Decreased safety awareness;Decreased knowledge of  precautions;Cardiopulmonary status limiting activity;Decreased range of motion       PT Treatment Interventions DME instruction;Functional mobility training;Therapeutic exercise;Balance training;Patient/family education;Modalities;Neuromuscular re-education;Stair training;Cognitive remediation;Gait training;Wheelchair mobility training;Manual techniques;Therapeutic activities    PT Goals (Current goals can be found in the Care Plan section)  Acute Rehab PT Goals PT Goal Formulation: Patient unable to participate in goal setting Time For Goal Achievement: 05/15/23 Potential to Achieve Goals: Fair    Frequency Min 1X/week     Co-evaluation               AM-PAC PT "6 Clicks" Mobility  Outcome Measure Help needed turning from your back to your side while in a flat bed without using bedrails?: A Little Help needed moving from lying on your back to sitting on the side of a flat bed without using bedrails?: A Lot Help needed moving to and from a bed to a chair (including a wheelchair)?: Total Help needed standing up from a chair using your arms (e.g., wheelchair or bedside chair)?: Total Help needed to walk in hospital room?: Total Help needed climbing 3-5 steps with a railing? : Total 6 Click Score: 9    End of Session Equipment Utilized During Treatment: Oxygen Activity Tolerance: Patient tolerated treatment well Patient left: in chair;with chair alarm set;with call bell/phone within reach Nurse Communication: Mobility status PT Visit Diagnosis: Unsteadiness on feet (R26.81);Muscle weakness (generalized) (M62.81);Other abnormalities of gait and mobility (R26.89);Difficulty in walking, not elsewhere classified (R26.2)    Time: 1610-9604 PT Time Calculation (min) (ACUTE ONLY): 19  min   Charges:   PT Evaluation $PT Re-evaluation: 1 Re-eval   PT General Charges $$ ACUTE PT VISIT: 1 Visit         Aleda Grana, PT, DPT 05/01/23, 2:32 PM  Sandi Mariscal 05/01/2023,  1:20 PM

## 2023-05-02 DIAGNOSIS — R6520 Severe sepsis without septic shock: Secondary | ICD-10-CM | POA: Diagnosis not present

## 2023-05-02 DIAGNOSIS — A419 Sepsis, unspecified organism: Secondary | ICD-10-CM | POA: Diagnosis not present

## 2023-05-02 DIAGNOSIS — R1310 Dysphagia, unspecified: Secondary | ICD-10-CM

## 2023-05-02 LAB — BASIC METABOLIC PANEL
Anion gap: 10 (ref 5–15)
BUN: 18 mg/dL (ref 8–23)
CO2: 20 mmol/L — ABNORMAL LOW (ref 22–32)
Calcium: 7.9 mg/dL — ABNORMAL LOW (ref 8.9–10.3)
Chloride: 108 mmol/L (ref 98–111)
Creatinine, Ser: <0.3 mg/dL — ABNORMAL LOW (ref 0.44–1.00)
Glucose, Bld: 86 mg/dL (ref 70–99)
Potassium: 3 mmol/L — ABNORMAL LOW (ref 3.5–5.1)
Sodium: 138 mmol/L (ref 135–145)

## 2023-05-02 LAB — GLUCOSE, CAPILLARY
Glucose-Capillary: 112 mg/dL — ABNORMAL HIGH (ref 70–99)
Glucose-Capillary: 149 mg/dL — ABNORMAL HIGH (ref 70–99)
Glucose-Capillary: 71 mg/dL (ref 70–99)
Glucose-Capillary: 81 mg/dL (ref 70–99)
Glucose-Capillary: 82 mg/dL (ref 70–99)
Glucose-Capillary: 84 mg/dL (ref 70–99)

## 2023-05-02 LAB — CBC WITH DIFFERENTIAL/PLATELET
Abs Immature Granulocytes: 0.08 10*3/uL — ABNORMAL HIGH (ref 0.00–0.07)
Basophils Absolute: 0 10*3/uL (ref 0.0–0.1)
Basophils Relative: 0 %
Eosinophils Absolute: 0 10*3/uL (ref 0.0–0.5)
Eosinophils Relative: 0 %
HCT: 30.8 % — ABNORMAL LOW (ref 36.0–46.0)
Hemoglobin: 10.4 g/dL — ABNORMAL LOW (ref 12.0–15.0)
Immature Granulocytes: 1 %
Lymphocytes Relative: 8 %
Lymphs Abs: 1 10*3/uL (ref 0.7–4.0)
MCH: 31.1 pg (ref 26.0–34.0)
MCHC: 33.8 g/dL (ref 30.0–36.0)
MCV: 92.2 fL (ref 80.0–100.0)
Monocytes Absolute: 0.5 10*3/uL (ref 0.1–1.0)
Monocytes Relative: 4 %
Neutro Abs: 10.3 10*3/uL — ABNORMAL HIGH (ref 1.7–7.7)
Neutrophils Relative %: 87 %
Platelets: 190 10*3/uL (ref 150–400)
RBC: 3.34 MIL/uL — ABNORMAL LOW (ref 3.87–5.11)
RDW: 15.5 % (ref 11.5–15.5)
WBC: 11.8 10*3/uL — ABNORMAL HIGH (ref 4.0–10.5)
nRBC: 0 % (ref 0.0–0.2)

## 2023-05-02 LAB — CULTURE, BLOOD (ROUTINE X 2)
Culture: NO GROWTH
Culture: NO GROWTH

## 2023-05-02 LAB — PHOSPHORUS: Phosphorus: 2.8 mg/dL (ref 2.5–4.6)

## 2023-05-02 LAB — MAGNESIUM: Magnesium: 1.9 mg/dL (ref 1.7–2.4)

## 2023-05-02 MED ORDER — GUAIFENESIN ER 600 MG PO TB12: 600.0000 mg | ORAL_TABLET | Freq: Two times a day (BID) | ORAL | Status: DC

## 2023-05-02 MED ORDER — DAPAGLIFLOZIN PROPANEDIOL 10 MG PO TABS
10.0000 mg | ORAL_TABLET | Freq: Every day | ORAL | Status: DC
  Administered 2023-05-02 – 2023-05-04 (×3): 10 mg via ORAL
  Filled 2023-05-02 (×3): qty 1

## 2023-05-02 MED ORDER — METOPROLOL SUCCINATE ER 25 MG PO TB24
12.5000 mg | ORAL_TABLET | Freq: Every day | ORAL | Status: DC
  Administered 2023-05-02 – 2023-05-06 (×4): 12.5 mg via ORAL
  Filled 2023-05-02 (×5): qty 1

## 2023-05-02 MED ORDER — LISINOPRIL 5 MG PO TABS
5.0000 mg | ORAL_TABLET | Freq: Every day | ORAL | Status: DC
  Administered 2023-05-02 – 2023-05-03 (×2): 5 mg via ORAL
  Filled 2023-05-02 (×2): qty 1

## 2023-05-02 MED ORDER — NEPRO/CARBSTEADY PO LIQD
237.0000 mL | Freq: Three times a day (TID) | ORAL | Status: DC
  Administered 2023-05-02 – 2023-05-05 (×7): 237 mL via ORAL

## 2023-05-02 MED ORDER — GUAIFENESIN ER 600 MG PO TB12
600.0000 mg | ORAL_TABLET | Freq: Two times a day (BID) | ORAL | Status: DC
  Administered 2023-05-02: 600 mg via ORAL
  Filled 2023-05-02 (×2): qty 1

## 2023-05-02 MED ORDER — POTASSIUM CHLORIDE CRYS ER 20 MEQ PO TBCR
40.0000 meq | EXTENDED_RELEASE_TABLET | ORAL | Status: AC
  Administered 2023-05-02 (×2): 40 meq via ORAL
  Filled 2023-05-02 (×2): qty 2

## 2023-05-02 NOTE — Progress Notes (Signed)
Progress Note   Patient: Teresa Sherman ZOX:[id number] DOB: 1946/03/27 DOA: 04/11/2023     21 DOS: the patient was seen and examined on 05/02/2023   Brief hospital course: 77 y.o. female with medical history significant for Prior stroke, depression with anxiety, prior ESBL E. coli, admitted 04/11/2023 with Sepsis, Acute Metabolic Encephalopathy, and Acute Hypoxic Respiratory Failure in the setting of COVID-19 Pneumonia and suspected superimposed bacterial pneumonia.   She was found after wellness check by her niece when family was not able to reach patient for two days.  She was found somnolent and confused in her bed and had unintelligible mumbling but without focal neurologic deficits. She is reportedly oriented x 3 at baseline. She was also noted by EMS to have foul-smelling urine.  Initial Vitals: HR to 115, RR 23 with O2 sat 90 to 95% on room air, BP 101/83 improving to 126/87 with IV fluids  Significant Labs: WBC of 8.1 but lactic acid 2.4 and procalcitonin 7.73. Hemoglobin 7.8 with baseline around 13 a year ago. CMP notable for creatinine of 1.78, up from 0.51 a year ago, AST/ALT 45/23. COVID-positive. [id number].  Imaging Chest X-ray showed Right lower airspace opacity.   Pt was admitted to hospitalist service initially.   Prolonged and complicated hospital course as outlined below:  Significant Hospital Events 8/19: Admitted by [facility]. 8/21-8/27 (per [facility]): severe sepsis secondary to COVID19 pneumonia & possible co-bacterial infection. Completed IV rocephin, azithromycin x 5 days, later given Unasyn >> Zosyn.  Pt was moved to stepdown placed on BiPAP >> heated HFNC.  Discussed code status w/ pt and she wants to continue full code.  8/23: Developed hypotension and increased fiO2 requirements, received IF fluids and albumin, moved to Stepdown.  Troponin elevated, placed on Heparin gtt, Cardiology consulted 8/24: Elevated Troponin felt to be due to demand ischemia, BP too soft for  GDMT 8/25: moved back to Progressive Cardiac unit 8/26: Back to Stepdown due to AMS and removing supplemental O2 with desaturations.  8/29: Increasing FiO2 requirements (up to 90% FiO2 via HHFNC).  PCCM consulted with high risk for intubation. 8/30: Decreased HHFNC to 65%/50L. Remains delirious. Found to have LLE DVT. 8/31: did not tolerate BPAP overnight. Remains on significant HHFNC. 9/1: tolerated BPAP overnight. Down to 50%/45L. PCCM signed off to [facility] 9/4: PCCM re-consulted for increased agitation, persistent tachycardia despite Dig, metoprolol and Amiodarone, also hypotensive  despite IVFs bolus now requiring vasopressor. SEVERE HYPOXIA REQUIRING INTUBATION 9/6: Extubated 9/7: [facility] resumed care of patient. On 4 L/min [place] O2.  SLP evaluated, dysphagia-1 diet started.   9/8 - weaned to room air, placed back on 2 L/min O2 due to borderline O2 sats.  PT/OT evaluations. Pt hallucinating.   Assessment and Plan: * Severe sepsis (HCC) POA, resolved. Severe sepsis criteria include tachycardia and tachypnea with soft blood pressure, AKI, AMS, lactic acidosis with elevated procalcitonin Due to COVID pneumonia with secondary bacterial infection, UTI --Completed antibiotics  Acute deep vein thrombosis (DVT) of left peroneal vein [facility]) Vascular was consulted for consideration of IVC filter. See consult note of 04/29/23.  Procedure was deferred due to goals of care and code status made DNR. --Start full dose Lovenox --If pt does not tolerate anticoagulation, will reach back out to Vascular for IVC filter placement --Monitor anemia closely - improving --Can likely transition to Eliquis in 1-2 days if anemia continues to improve --Monitor for any signs of bleeding  Acute on chronic respiratory failure with hypoxia (HCC) Resolved. Due to Covid Pneumonia, likely  also bacterial.  Required intubation, extubated on 9/6.   Now weaned to room air. --O2 as needed --Maintain sats > 90% --Mgmt of underlying  respiratory conditions as outlined. --Pulmonary hygiene  Acute metabolic encephalopathy Underlying Dementia vs [facility] Delirium Initially related to sepsis which is now resolved.  Ongoing confusion, hallucinations consistent with delirium, not unusual given prolonged admission complicated by ICU/ intubation. At baseline pt is oriented x 3 per family with only mild cognitive impairment.  CT head negative. --Mgmt of underlying issues as outlined --Supportive care --Delirium precautions --Resume trazodone at bedtime PRN --AVOID BENZO's due to paradoxical worsened agitation when given earlier in course of admission --Off Precedex  Pneumonia due to COVID-19 virus Right lower lobe pneumonia Completed Rocephin and azithromycin --Off COVID precautions --Pulmonary hygiene --O2 per protocol --Symptomatic care as needed --Remains on IV steroids 10 mg through 9/11  CAP (community acquired pneumonia) Completed antibiotics. Suspect secondary bacterial pneumonia in setting of Covid-19  Hypophosphatemia Resolved with replacement 9/7, 9/8 Monitor & replace PRN  Acute HFrEF (heart failure with reduced ejection fraction) (HCC) Elevated Troponin / Demand Ischemia ?Covid-related myocarditis Echocardiogram 04/17/23: LVEF severely depressed, the proximal 1/4 of LV thickens but rest of ventricle is severely hypokinetic, concerning for Takotsubo's Cardiomyopathy. Akinesis/dyskinesis of distal 2/3 of RV with aneurysmal dilation in mid region, RV sytolic function moderately reduced, moderate Aortic regurgitation -Maintain MAP >65 --BP's stable off pressors --hs-Troponin peaked at 3,734 --Diuresis as needed, as BP and renal function permits ~ Lasix currently held  -Seen by Cardiology earlier in course, signed off on 8/25 , re-consulted on 8/30 to evaluate for severe LV dysfunction~ continue ASA and statin, Restarted GDMT due to BP and HR, now held due to hypotension --9/9 - Cardiology started low  dose lisinopril, metoprolol with BP hold parameters, Farxiga --Plan to up-titrate GDMT as BP allows   Anemia Improving. Uncertain acuity.  Hemoglobin 7.8 on admission, down from 13 a year ago. 9/7 - ICU RN reports pt has been having dark stools.  Anticoagulation was held and she was given 2 units pRBC's on 9/4 and 9/5.   --Monitor for bleeding signs --Daily CBC's, more frequent H&H as needed. Hbg stable at 8.4 >> 8.9 >> 9.7 >> 10.4 --Resumed anticoagulation and anemia continues to improve --No reports of dark stools in recent days  AKI (acute kidney injury) (HCC) Resolved.  Creatinine 1.78 on admission.  up from baseline of 0.51, likely prerenal and related to sepsis. Renal function normalized. --Monitor BMP's  History of stroke ASA was held and due to anemia requiring transfusion, resume. Continue statin.  Depression with anxiety Insomnia Resume home trazodone at bedtime PRN  Urinary tract infection History of Pseudomonas and ESBL E. coli UTI 11/2021 Urine reportedly foul-smelling with EMS.  Urine culture insignificant growth. Completed antibiotics for pneumonia as outlined.   Rhabdomyolysis CK near 1500 on admission, unknown whether traumatic or not.  Patient was found on the bed. Resolved with IV hydration.  Dysphagia Seems related to acute delirium. Pt was seen by SLP and on regular diet earlier this admission, pre-intubation.  Since extubation, pt's mental status remains altered and poor awareness in general. Ongoing wet sounding cough. --SLP following, appreciate recommendations --On pureed diet with nectar-thickened liquids.  Diet advancement per SLP --Assist with feeding patient  Generalized weakness Due to prolonged & complicated hospitalization. --PT/OT evaluations --Fall precautions  Dementia (HCC) Resume Aricept Delirium precautions AVOID BENZO's  Sepsis with acute hypoxic respiratory failure without septic shock (HCC) POA, now resolved.  Subjective: Pt seen awake resting in bed, remains very confused and hallucinating.  We has wet sounding cough and vocal quality today.  It clears briefly after she coughs, but quickly recurs, but no sign of respiratory distress and O2 sats stable off oxygen.   She thinks she is at home, someone is at the back door she wants to let in.     Physical Exam: Vitals:   05/02/23 0417 05/02/23 0500 05/02/23 0752 05/02/23 1224  BP: (!) 108/97  (!) 152/56 133/83  Pulse: 97  (!) 108 (!) 102  Resp: 16  16 18   Temp: 97.9 F (36.6 C)  98 F (36.7 C)   TempSrc: Oral     SpO2: 91%  95% 92%  Weight:  53 kg    Height:       General exam: awake, alert, no acute distress, confused, frail and chronically ill appearing HEENT: moist mucus membranes, hearing grossly normal  Respiratory system: lungs clear with diminished bases, low volume inspirations, no wheezes, wet-sounding cough, normal respiratory effort, on room air Cardiovascular system: normal S1/S2, RRR, no pedal edema.   Gastrointestinal system: soft, NT, ND, bowel sounds present Central nervous system: A&O x self. no gross focal neurologic deficits, normal speech Extremities: moves all, no edema, normal tone Skin: dry, intact, normal temperature Psychiatry: normal mood, congruent affect, abnormal judgement and insight, visual hallucinations noted    Data Reviewed:  Notable labs --- K 3.0, Cr < 0.30, Ca 7.9,  Hbg 8.4 >> 8.9 >> 9.7 >> 10.4 WBC 11.8   Micro:  Urine culture 9/4 - insignificant growth Blood cultures 9/5 - negative to date Sputum culture - normal respiratory flora  C-diff negative 04/13/23 GI panel negative 04/13/23  Blood cultures 8/19 -- +Staph hominis likely contaminant Repeat blood cultures 8/21 - negative & final  Covid-19 POSITIVE on 04/11/23 -- now off precautions    Family Communication: Daughter Victorino Dike updated at bedside this afternoon (9/9)  Disposition: Status is: Inpatient Remains inpatient  appropriate because: severity of illness as above.    Planned Discharge Destination: Skilled nursing facility    Time spent: 48 minutes  Author: Pennie Banter, DO 05/02/2023 2:23 PM  For on call review [email or web address].

## 2023-05-02 NOTE — Progress Notes (Signed)
PHARMACY CONSULT NOTE - ELECTROLYTES  Pharmacy Consult for Electrolyte Monitoring and Replacement   Recent Labs: Height: 5' (152.4 cm) Weight: 53 kg (116 lb 13.5 oz) IBW/kg (Calculated) : 45.5 CrCl cannot be calculated (This lab value cannot be used to calculate CrCl because it is not a number: <0.30). Potassium (mmol/L)  Date Value  05/02/2023 3.0 (L)  11/26/2013 3.8   Magnesium (mg/dL)  Date Value  84/69/6295 1.9   Calcium (mg/dL)  Date Value  28/41/3244 7.9 (L)   Calcium, Total (mg/dL)  Date Value  08/25/7251 9.5   Albumin (g/dL)  Date Value  66/44/0347 1.9 (L)  04/30/2015 4.3  11/26/2013 3.9   Phosphorus (mg/dL)  Date Value  42/59/5638 2.8   Sodium (mmol/L)  Date Value  05/02/2023 138  04/30/2015 140  11/26/2013 142   Assessment  Teresa Sherman is a 77 y.o. female presenting with Severe sepsis secondary to pneumonia. PMH significant for CVA, Anemia, and depression. Pharmacy has been consulted to monitor and replace electrolytes.  Diet/Nutrition: Dysphagia 2 diet orderedJuven q packet BID.   Goal of Therapy: Electrolytes WNL  Plan:  Kcl x 2 doses F/u with AM labs. .    Thank you for involving pharmacy in this patient's care.   Bettey Costa, PharmD Clinical Pharmacist 05/02/2023 8:38 AM

## 2023-05-02 NOTE — Progress Notes (Addendum)
Nutrition Follow-up  DOCUMENTATION CODES:   Not applicable  INTERVENTION:   -D/c Juven -D/c Vital AF 1.2 -Continue 100 mg thiamine daily -MVI with minerals daily -Continue 500 mg vitamin C BID -Magic cup TID with meals, each supplement provides 290 kcal and 9 grams of protein  -Nepro Shake po TID, each supplement provides 425 kcal and 19 grams protein  -Feeding assistance with meals  NUTRITION DIAGNOSIS:   Increased nutrient needs related to acute illness (COVID-19) as evidenced by estimated needs.  Ongoing  GOAL:   Patient will meet greater than or equal to 90% of their needs  Progressing   MONITOR:   PO intake, Supplement acceptance, Diet advancement  REASON FOR ASSESSMENT:   Consult Assessment of nutrition requirement/status  ASSESSMENT:   77 y/o female with h/o anxiety, depression, stroke, dementia, spinal stenosis, HLD and GERD who is admitted with COVID 19, AKI, UTI, NSTEMI, DVT and sepsis.  8/23- s/p BSE- advanced to regular diet  9/4- intubated 9/5- TF initiated 9/6- extubated 9/7- s/p BSE- advanced to dysphagia 1 diet with nectar thick liquids 9/8- s/p BSE- advanced to dysphagia 2 diet with thin liquids 9/9- downgraded to dysphagia 1 diet with nectar thick liquids  Reviewed I/O's: -2.1 L x 24 hours and -10.3 L since 04/18/23  UUP: 2 L x 24 hours   Pt working with acute rehab team at time of visit. Pt has been confused and experiencing hallucinations at times. Noted meal tray at bedside; untouched. Documented meal completions 0-50%.   Noted TF orders still ordered, however, pt with no feeding access.   Wt has been stable over the past week.   Per TOC notes, plan for potential CIR admit.   Medications reviewed and include vitamin C, farxiga, colace, melatonin, and solu-medrol.   Labs reviewed: CBGS: 71-165 (inpatient orders for glycemic control are 0-15 units insulin aspart every 4 hours).    Diet Order:   Diet Order             DIET - DYS 1  Room service appropriate? Yes with Assist; Fluid consistency: Nectar Thick  Diet effective now                   EDUCATION NEEDS:   Education needs have been addressed  Skin:  Skin Assessment: Skin Integrity Issues: Skin Integrity Issues:: Unstageable DTI: coccyx Unstageable: rt coccyx  Last BM:  05/02/23 (type 6)  Height:   Ht Readings from Last 1 Encounters:  04/19/23 5' (1.524 m)    Weight:   Wt Readings from Last 1 Encounters:  05/02/23 53 kg    Ideal Body Weight:  45.5 kg  BMI:  Body mass index is 22.82 kg/m.  Estimated Nutritional Needs:   Kcal:  1650-1850  Protein:  85-100 grams  Fluid:  > 1.7 L    Levada Schilling, RD, LDN, CDCES Registered Dietitian II Certified Diabetes Care and Education Specialist Please refer to [facility] for RD and/or RD on-call/weekend/after hours pager

## 2023-05-02 NOTE — Progress Notes (Signed)
Occupational Therapy Re-Evaluation Patient Details Name: Teresa Sherman MRN: [id number] DOB: 06/30/1946 Today's Date: 05/02/2023   History of present illness Pt is a 77 y/o F admitted on 04/11/23 for sepsis, acute metabolic encephalopathy, acute hypoxic respiratory failure in the setting of COVID-19 PNA & suspected superimposed bacterial PNA. Pt found to have LLE DVT on 04/22/23, intubated on 04/27/23, extubated 04/29/23. PMH: stroke, depression, anxiety   OT comments  Ms Zeger was seen for OT treatment on this date. Upon arrival to room pt reclined in bed, agreeable to tx. Progress limited by baseline cognitive deficits. Pt requires MIN A face washing seated EOB, intermittent assist for sitting balance. Tolerates ~10 min static sitting with CGA - MIN A. X3 standing trials with MOD A for 2 trials, MAX A for 3rd trial. Follows 1 step commands however difficulty maintaining upright posture in sitting and standing. Goals remain appropriate, will continue to follow POC. Discharge recommendation remains appropriate.        If plan is discharge home, recommend the following:  A lot of help with walking and/or transfers;A lot of help with bathing/dressing/bathroom;Supervision due to cognitive status;Help with stairs or ramp for entrance;Assistance with feeding;Direct supervision/assist for medications management;Assistance with cooking/housework;Assist for transportation   Equipment Recommendations  None recommended by OT    Recommendations for Other Services      Precautions / Restrictions Precautions Precautions: Fall Restrictions Weight Bearing Restrictions: No       Mobility Bed Mobility Overal bed mobility: Needs Assistance Bed Mobility: Supine to Sit, Sit to Supine     Supine to sit: Mod assist, HOB elevated, Used rails Sit to supine: Max assist        Transfers Overall transfer level: Needs assistance Equipment used: 1 person hand held assist Transfers: Sit to/from  Stand Sit to Stand: Mod assist           General transfer comment: MOD A for 2 standing trials, MAX A for 3rd trial     Balance Overall balance assessment: Needs assistance Sitting-balance support: Feet supported, Bilateral upper extremity supported Sitting balance-Leahy Scale: Fair     Standing balance support: Bilateral upper extremity supported, Reliant on assistive device for balance Standing balance-Leahy Scale: Poor                             ADL either performed or assessed with clinical judgement   ADL Overall ADL's : Needs assistance/impaired                                       General ADL Comments: MIN A face washing seated EOB, intetmittent assist for sitting balance. Tolerates ~10 min static sitting with CGA - MIN A    Extremity/Trunk Assessment Upper Extremity Assessment Upper Extremity Assessment: Generalized weakness   Lower Extremity Assessment Lower Extremity Assessment: Generalized weakness         Cognition Arousal: Alert Behavior During Therapy: WFL for tasks assessed/performed Overall Cognitive Status: No family/caregiver present to determine baseline cognitive functioning Area of Impairment: Orientation, Attention, Memory, Following commands, Awareness, Safety/judgement, Problem solving                 Orientation Level: Disoriented to, Time, Situation, Place   Memory: Decreased recall of precautions, Decreased short-term memory Following Commands: Follows one step commands consistently Safety/Judgement: Decreased awareness of safety, Decreased awareness of deficits  Problem Solving: Requires verbal cues, Requires tactile cues General Comments: States location as home,                   Pertinent Vitals/ Pain       Pain Assessment Pain Assessment: No/denies pain   Frequency  Min 1X/week        Progress Toward Goals  OT Goals(current goals can now be found in the care plan section)   Progress towards OT goals: Progressing toward goals  Acute Rehab OT Goals Patient Stated Goal: to go home OT Goal Formulation: With patient Time For Goal Achievement: 05/16/23 Potential to Achieve Goals: Fair ADL Goals Pt Will Perform Grooming: with modified independence;standing Pt Will Perform Lower Body Dressing: with modified independence;sit to/from stand Pt Will Transfer to Toilet: with modified independence;ambulating;regular height toilet  Plan      Co-evaluation                 AM-PAC OT "6 Clicks" Daily Activity     Outcome Measure   Help from another person eating meals?: A Little Help from another person taking care of personal grooming?: A Little Help from another person toileting, which includes using toliet, bedpan, or urinal?: A Lot Help from another person bathing (including washing, rinsing, drying)?: A Lot Help from another person to put on and taking off regular upper body clothing?: A Little Help from another person to put on and taking off regular lower body clothing?: A Lot 6 Click Score: 15    End of Session Equipment Utilized During Treatment: Gait belt  OT Visit Diagnosis: Unsteadiness on feet (R26.81);Muscle weakness (generalized) (M62.81)   Activity Tolerance Patient tolerated treatment well   Patient Left in bed;with call bell/phone within reach;with bed alarm set   Nurse Communication          Time: 1610-9604 OT Time Calculation (min): 23 min  Charges: OT General Charges $OT Visit: 1 Visit OT Evaluation $OT Re-eval: 1 Re-eval OT Treatments $Self Care/Home Management : 8-22 mins  Kathie Dike, M.S. OTR/L  05/02/23, 12:35 PM  ascom [phone]

## 2023-05-02 NOTE — Assessment & Plan Note (Addendum)
Seems related to acute delirium. Pt was seen by SLP and on regular diet earlier this admission, pre-intubation.  Since extubation, pt's mental status remains altered and poor awareness in general. Ongoing wet sounding cough. --SLP following, appreciate recommendations --On pureed diet with nectar-thickened liquids.  Diet advancement per SLP --Assist with feeding patient

## 2023-05-02 NOTE — Progress Notes (Addendum)
Speech Language Pathology Treatment: Dysphagia  Patient Details Name: Teresa Sherman MRN: [id number] DOB: Jan 16, 1946 Today's Date: 05/02/2023 Time: 7829-5621 SLP Time Calculation (min) (ACUTE ONLY): 50 min  Assessment / Plan / Recommendation Clinical Impression  Pt seen today for ongoing assessment of swallowing; toleration of oral diet. Pt is exhibited increased Hallucinations today requiring more Supervision. She is also exhibited wet vocal quality BEFORE/DURING/AFTER po intake -- dry, then wet vocal quality noted as pt sat in the bed talking followed by throat clear/cough.  Pt extremely Distracted and Impulsive throughout session secondary to the Hallucinations; she required MOD+ verbal/visual cues. Dtr present.  On RA, afebrile.   During po trials, similar presentation was noted; pt consumed puree trials w/ Crushed pills w/ NSG alternating b/t puree and sips of Nectar consistency liquids. Similar dry, then wet vocal quality noted intermittently during po trials but no overall decline in pt's ANS presentation. She exhibited no decline in respiratory function requiring O2 support; no continuous coughing prominent. MD was present during much of this presentation.   Unsure why pt is now exhibiting increased wet vocal quality (not always w/ po intake) -- suspect potential impact from Phlegm. But d/t pt's increased Hallucinations and decreased attention to task, risk for aspiration/aspiration pneumonia is increased.  Recommend downgrade of pt's diet back to a Dysphagia level 1 w/ Nectar liquids w/ strict aspiration precautions. Pt continues to be tx'd for "severe sepsis criteria include tachycardia and tachypnea with soft blood pressure, AKI, AMS, lactic acidosis with elevated procalcitonin; COVID pneumonia with secondary bacterial infection, UTI", per MD note.  ST services will f/u w/ toleration of diet, monitor her overall status, and education on precautions; monitor for need for objective  swallow assessment next 1-3 days. MD agreed. NSG updated on strict aspiration precautions and Supervision w/ all oral intake.       HPI HPI: Pt is a 77 y.o. female with medical history significant for prior stroke, depression with anxiety, prior ESBL E. coli, who presents to the emergency room by EMS with altered mental status.  She was found after wellness check was requested after she had not been seen in 2 days.  She was found somnolent and confused in her bed and had unintelligible mumbling but without focal neurologic deficits.  She is reportedly oriented x 3 at baseline.  She was also noted by EMS to have foul-smelling urine ED course and data review:  COVID-positive.   Per her history, she was previously diagnosed with Mild Cognitive Impairment(MCI) but is able to speak in function with minimal issues.  She does occasionally display emotional lability and say inappropriate things at times however these have been ongoing issues.   CXR this admit: There has developed extensive airspace infiltrate throughout the  right lung in keeping with progressive pneumonic infiltrate. Left  lung is clear. No pneumothorax or pleural effusion. Cardiac size  within normal limits. Pulmonary vascularity is normal.      SLP Plan  Continue with current plan of care      Recommendations for follow up therapy are one component of a multi-disciplinary discharge planning process, led by the attending physician.  Recommendations may be updated based on patient status, additional functional criteria and insurance authorization.    Recommendations  Diet recommendations: Dysphagia 1 (puree);Nectar-thick liquid Liquids provided via: Cup;Teaspoon Medication Administration: Crushed with puree Supervision: Full supervision/cueing for compensatory strategies Compensations: Minimize environmental distractions;Slow rate;Small sips/bites;Lingual sweep for clearance of pocketing;Multiple dry swallows after each bite/sip;Follow  solids with liquid Postural  Changes and/or Swallow Maneuvers: Out of bed for meals;Seated upright 90 degrees;Upright 30-60 min after meal (REFLUX precautions)                 (Dietician f/u) Oral care BID;Oral care before and after PO;Staff/trained caregiver to provide oral care   Frequent or constant Supervision/Assistance Dysphagia, pharyngeal phase (R13.13);Dysphagia, pharyngoesophageal phase (R13.14)     Continue with current plan of care       Jerilynn Som, MS, CCC-SLP Speech Language Pathologist Rehab Services; [facility] [place] [phone] (ascom) Amica Harron  05/02/2023, 5:22 PM

## 2023-05-02 NOTE — Plan of Care (Signed)
  Problem: Fluid Volume: Goal: Hemodynamic stability will improve Outcome: Progressing   Problem: Clinical Measurements: Goal: Diagnostic test results will improve Outcome: Progressing Goal: Signs and symptoms of infection will decrease Outcome: Progressing   Problem: Respiratory: Goal: Ability to maintain adequate ventilation will improve Outcome: Progressing   Problem: Coping: Goal: Psychosocial and spiritual needs will be supported Outcome: Progressing   Problem: Respiratory: Goal: Will maintain a patent airway Outcome: Progressing Goal: Complications related to the disease process, condition or treatment will be avoided or minimized Outcome: Progressing   Problem: Clinical Measurements: Goal: Ability to maintain clinical measurements within normal limits will improve Outcome: Progressing Goal: Will remain free from infection Outcome: Progressing Goal: Diagnostic test results will improve Outcome: Progressing Goal: Respiratory complications will improve Outcome: Progressing Goal: Cardiovascular complication will be avoided Outcome: Progressing   Problem: Activity: Goal: Risk for activity intolerance will decrease Outcome: Progressing   Problem: Nutrition: Goal: Adequate nutrition will be maintained Outcome: Progressing   Problem: Coping: Goal: Level of anxiety will decrease Outcome: Progressing   Problem: Elimination: Goal: Will not experience complications related to bowel motility Outcome: Progressing Goal: Will not experience complications related to urinary retention Outcome: Progressing   Problem: Pain Managment: Goal: General experience of comfort will improve Outcome: Progressing   Problem: Safety: Goal: Ability to remain free from injury will improve Outcome: Progressing   Problem: Skin Integrity: Goal: Risk for impaired skin integrity will decrease Outcome: Progressing   Problem: Coping: Goal: Ability to adjust to condition or change in  health will improve Outcome: Progressing   Problem: Fluid Volume: Goal: Ability to maintain a balanced intake and output will improve Outcome: Progressing   Problem: Metabolic: Goal: Ability to maintain appropriate glucose levels will improve Outcome: Progressing   Problem: Nutritional: Goal: Maintenance of adequate nutrition will improve Outcome: Progressing Goal: Progress toward achieving an optimal weight will improve Outcome: Progressing   Problem: Skin Integrity: Goal: Risk for impaired skin integrity will decrease Outcome: Progressing   Problem: Tissue Perfusion: Goal: Adequacy of tissue perfusion will improve Outcome: Progressing   Problem: Respiratory: Goal: Ability to maintain a clear airway and adequate ventilation will improve Outcome: Progressing   Problem: Role Relationship: Goal: Method of communication will improve Outcome: Progressing

## 2023-05-02 NOTE — Progress Notes (Signed)
[facility] CLINIC CARDIOLOGY PROGRESS NOTE   Patient ID: Teresa Sherman MRN: [id number] DOB/AGE: 75-Dec-1947 75 y.o.  Admit date: 04/11/2023 Referring Physician Dr. Joylene Igo Primary Physician Dr [name]  Primary Cardiologist none Reason for Consultation LV dysfunction  HPI: Teresa Sherman is a 77 y.o. female with a past medical history of CVA who presented to the ED on 04/11/2023 for SOB. She was seen by cardiology early in her admission for elevated troponin, NSTEMI in setting of covid infection. Cardiology reconsulted 8/30 for additional evaluation of severe LV dysfunction.  Interval History:  -Patient seen and examined, oriented x1 this afternoon. -BP improved, HR elevated on AM vitals. Not on tele. -Renal function stable. Continues to have good UOP.  Review of systems complete and found to be negative unless listed above    Vitals:   05/01/23 2108 05/02/23 0417 05/02/23 0500 05/02/23 0752  BP: 136/87 (!) 108/97  (!) 152/56  Pulse: 96 97  (!) 108  Resp: 16 16  16   Temp: 97.7 F (36.5 C) 97.9 F (36.6 C)  98 F (36.7 C)  TempSrc: Oral Oral    SpO2: 91% 91%  95%  Weight:   53 kg   Height:         Intake/Output Summary (Last 24 hours) at 05/02/2023 1224 Last data filed at 05/02/2023 1144 Gross per 24 hour  Intake 0 ml  Output 1800 ml  Net -1800 ml     PHYSICAL EXAM General: Well-appearing elderly female, in no acute distress. HEENT: Normocephalic and atraumatic. Neck: No JVD.  Lungs: Normal respiratory effort on room air. Coarse breath sounds bilaterally.  Heart: HRRR. Normal S1 and S2 without gallops or murmurs. Radial & DP pulses 2+ bilaterally. Abdomen: Non-distended appearing.  Msk: Normal strength and tone for age. Extremities: No clubbing, cyanosis or edema.  Warm & well perfused.   LABS: Basic Metabolic Panel: Recent Labs    05/01/23 0625 05/02/23 0519  NA 138 138  K 3.4* 3.0*  CL 107 108  CO2 22 20*  GLUCOSE 83 86  BUN 15 18  CREATININE  0.32* <0.30*  CALCIUM 8.1* 7.9*  MG 2.1 1.9  PHOS 2.0* 2.8   Liver Function Tests: No results for input(s): "AST", "ALT", "ALKPHOS", "BILITOT", "PROT", "ALBUMIN" in the last 72 hours.  No results for input(s): "LIPASE", "AMYLASE" in the last 72 hours. CBC: Recent Labs    05/01/23 0625 05/02/23 0519  WBC 11.0* 11.8*  NEUTROABS 8.7* 10.3*  HGB 9.7* 10.4*  HCT 28.3* 30.8*  MCV 93.1 92.2  PLT 191 190   Cardiac Enzymes: No results for input(s): "CKTOTAL", "CKMB", "CKMBINDEX", "TROPONINIHS" in the last 72 hours. BNP: No results for input(s): "BNP" in the last 72 hours.  D-Dimer: No results for input(s): "DDIMER" in the last 72 hours. Hemoglobin A1C: No results for input(s): "HGBA1C" in the last 72 hours.  Fasting Lipid Panel: No results for input(s): "CHOL", "HDL", "LDLCALC", "TRIG", "CHOLHDL", "LDLDIRECT" in the last 72 hours. Thyroid Function Tests: No results for input(s): "TSH", "T4TOTAL", "T3FREE", "THYROIDAB" in the last 72 hours.  Invalid input(s): "FREET3" Anemia Panel: No results for input(s): "VITAMINB12", "FOLATE", "FERRITIN", "TIBC", "IRON", "RETICCTPCT" in the last 72 hours.   No results found.  ECHO 04/22/2023: 1. Left ventricular ejection fraction, by estimation, is 30 to 35%. Left ventricular ejection fraction by 3D volume is 31 %. Left ventricular ejection fraction by PLAX is 34 %. The left ventricle has moderately decreased function. The left ventricle demonstrates global hypokinesis. Left ventricular  diastolic parameters are consistent with Grade I diastolic dysfunction (impaired relaxation).   2. Right ventricular systolic function is mildly reduced. The right ventricular size is normal.   3. The mitral valve is normal in structure. Mild mitral valve regurgitation. No evidence of mitral stenosis.   4. The aortic valve has an indeterminant number of cusps. Aortic valve regurgitation is moderate. No aortic stenosis is present.   5. The inferior vena cava is  normal in size with greater than 50% respiratory variability, suggesting right atrial pressure of 3 mmHg.   TELEMETRY reviewed by me 05/02/23: not on tele  EKG reviewed by me 05/02/23: NSR rate 92 bpm, T wave inversions in aVL, V2  DATA reviewed by me 05/02/23: last 24h vitals tele labs imaging I/O, hospitalist progress notes, respiratory notes, nursing notes  Principal Problem:   Severe sepsis (HCC) Active Problems:   Depression with anxiety   History of stroke   CAP (community acquired pneumonia)   Pneumonia due to COVID-19 virus   Rhabdomyolysis   AKI (acute kidney injury) (HCC)   Acute metabolic encephalopathy   Urinary tract infection   Anemia   Acute on chronic respiratory failure with hypoxia (HCC)   Sepsis with acute hypoxic respiratory failure without septic shock (HCC)   Acute deep vein thrombosis (DVT) of left peroneal vein (HCC)   Acute HFrEF (heart failure with reduced ejection fraction) (HCC)   Elevated troponin   Hypophosphatemia   Dementia (HCC)   Generalized weakness    ASSESSMENT AND PLAN: Teresa Sherman is a 77 y.o. female with a past medical history of CVA who presented to the ED on 04/11/2023 for SOB. She was seen by cardiology early in her admission for elevated troponin, NSTEMI in setting of covid infection. Cardiology reconsulted 8/30 for additional evaluation of severe LV dysfunction.  # Takotsubo cardiomyopathy vs covid-19 viral myocarditis # Acute heart failure reduced ejection fraction # NSTEMI Patient admitted with COVID-19 infection found to have NSTEMI on hospital day 5. Echo revealed severely reduced LV function. Was started on GDMT after BP was able to tolerate and doses titrated. Developed hypotension requiring pressor support on 9/4. Now weaned off of pressors and transferred out off ICU. -Start low dose lisinopril, metoprolol. Hold parameters in place. -Start farxiga. Consider loop diuretic if volume status appears up. -Plan to  uptitrate doses as BP and renal function allow.  # Acute metabolic encephalopathy Patient oriented x1 this AM, baseline oriented x3.  -Management per primary  # Septic shock 2/2 ESBL UTI # Sinus tachycardia, resolved Patient with rates elevated 9/3, up to 150s. Rates improved with sedation while in the ICU.  -Gentle IV hydration, now net negative 4.5 L.  -Meropenem and vancomycin now discontinued  # Acute hypoxic respiratory failure # Covid-19 pneumonia Had increased oxygen requirement on BiPAP earlier in admission then weaned to room air by 9/3. Respiratory status declined overnight on 9/4 requiring intubation and mechanical ventilation. Extubated on 9/6. Now on room air. -Management per primary.   # Acute DVT # Anemia DVT found in LLE on [place] 04/22/2023.  -Eliquis switched to heparin on 9/4 then held due to anemia requiring blood transfusion overnight on 9/4. Hemoglobin now uptrending.  -Plan to restart eliquis as hemoglobin improves -Continue to monitor H&H closely.  This patient's case was discussed and created with Dr. Melton Alar and she is in agreement.  Signed:  Gale Journey, PA-C  05/02/2023, 12:24 PM [facility]

## 2023-05-02 NOTE — Progress Notes (Signed)
Physical Therapy Treatment Patient Details Name: Teresa Sherman MRN: [id number] DOB: 10/29/1945 Today's Date: 05/02/2023   History of Present Illness Pt is a 77 y/o F admitted on 04/11/23 for sepsis, acute metabolic encephalopathy, acute hypoxic respiratory failure in the setting of COVID-19 PNA & suspected superimposed bacterial PNA. Pt found to have LLE DVT on 04/22/23, intubated on 04/27/23, extubated 04/29/23. PMH: stroke, depression, anxiety    PT Comments  Pt found supine in bed upon entry. Pt willing to work with physical therapy and put forth fair effort today, though she is confused and not oriented. Nursing made aware of pt's cognitive status. Pt is Mod A +2 for bed mobility and transfers secondary to pt's inability to consistently follow commands. Pt was able to take a few shuffling steps towards [facility] in a crouched position, needing Mod a +2 for safety and AD management. Pt given cues for correct posture and safe sequencing but pt made minimal changes overall. Pt will benefit from continued PT services upon discharge to safely address deficits listed in patient problem list for decreased caregiver assistance and eventual return to PLOF.    If plan is discharge home, recommend the following: Assistance with cooking/housework;Direct supervision/assist for medications management;Direct supervision/assist for financial management;Assist for transportation;Help with stairs or ramp for entrance;Supervision due to cognitive status;Two people to help with walking and/or transfers;Two people to help with bathing/dressing/bathroom   Can travel by private vehicle     No  Equipment Recommendations  None recommended by PT (TBD at next venue of care)    Recommendations for Other Services       Precautions / Restrictions Precautions Precautions: Fall Restrictions Weight Bearing Restrictions: No     Mobility  Bed Mobility Overal bed mobility: Needs Assistance Bed Mobility: Rolling,  Sidelying to Sit, Sit to Supine Rolling: Mod assist, +2 for physical assistance Sidelying to sit: +2 for physical assistance, Mod assist   Sit to supine: Mod assist, +2 for physical assistance   General bed mobility comments: Pt Mod A +2 for general bed mobility    Transfers Overall transfer level: Needs assistance Equipment used: Rolling walker (2 wheels) Transfers: Sit to/from Stand Sit to Stand: Mod assist, +2 physical assistance           General transfer comment: STS from EOB mod A+2, provided max cues and redirects for task at hand, pt struggling to maintain upright posture, kept in slight crouched position despite verbal cues for corrections    Ambulation/Gait Ambulation/Gait assistance: Mod assist, +2 physical assistance Gait Distance (Feet): 3 Feet (shuflfing side steps) Assistive device: Rolling walker (2 wheels) Gait Pattern/deviations: Shuffle Gait velocity: decreased     General Gait Details: Pt able to take few shuflfing side steps towards [facility] with verbal and tactile cues, needing assit for AD management as well.   Stairs             Wheelchair Mobility     Tilt Bed    Modified Rankin (Stroke Patients Only)       Balance   Sitting-balance support: Feet supported, Bilateral upper extremity supported Sitting balance-Leahy Scale: Fair (poor>fair) Sitting balance - Comments: initally poor, improved to fair with close supervision over ~3-4 min   Standing balance support: Bilateral upper extremity supported, Reliant on assistive device for balance Standing balance-Leahy Scale: Zero Standing balance comment: static standing at RW, needing constand Mod A to maintain balance  Cognition Arousal: Alert Behavior During Therapy: Impulsive Overall Cognitive Status: No family/caregiver present to determine baseline cognitive functioning                                          Exercises       General Comments        Pertinent Vitals/Pain Pain Assessment Pain Assessment: Faces Faces Pain Scale: No hurt    Home Living                          Prior Function            PT Goals (current goals can now be found in the care plan section) Progress towards PT goals: Progressing toward goals    Frequency    Min 1X/week      PT Plan      Co-evaluation              AM-PAC PT "6 Clicks" Mobility   Outcome Measure  Help needed turning from your back to your side while in a flat bed without using bedrails?: A Lot Help needed moving from lying on your back to sitting on the side of a flat bed without using bedrails?: A Lot Help needed moving to and from a bed to a chair (including a wheelchair)?: Total Help needed standing up from a chair using your arms (e.g., wheelchair or bedside chair)?: A Lot Help needed to walk in hospital room?: A Lot Help needed climbing 3-5 steps with a railing? : Total 6 Click Score: 10    End of Session Equipment Utilized During Treatment: Gait belt Activity Tolerance: Patient tolerated treatment well Patient left: in bed;with call bell/phone within reach;with bed alarm set Nurse Communication: Mobility status PT Visit Diagnosis: Unsteadiness on feet (R26.81);Muscle weakness (generalized) (M62.81);Other abnormalities of gait and mobility (R26.89);Difficulty in walking, not elsewhere classified (R26.2)     Time: 7829-5621 PT Time Calculation (min) (ACUTE ONLY): 29 min  Charges:    $Therapeutic Activity: 23-37 mins PT General Charges $$ ACUTE PT VISIT: 1 Visit                     Cecile Sheerer, SPT 05/02/23, 1:42 PM

## 2023-05-02 NOTE — Progress Notes (Signed)
Inpatient Rehab Admissions Coordinator:   Left message for brother HCPOA to review CIR recommendations and caregiver support.  Will follow.   Estill Dooms, PT, DPT Admissions Coordinator [phone] 05/02/23  11:30 AM

## 2023-05-03 ENCOUNTER — Inpatient Hospital Stay: Payer: Medicare HMO

## 2023-05-03 DIAGNOSIS — R6520 Severe sepsis without septic shock: Secondary | ICD-10-CM | POA: Diagnosis not present

## 2023-05-03 DIAGNOSIS — A419 Sepsis, unspecified organism: Secondary | ICD-10-CM | POA: Diagnosis not present

## 2023-05-03 LAB — BASIC METABOLIC PANEL
Anion gap: 7 (ref 5–15)
BUN: 24 mg/dL — ABNORMAL HIGH (ref 8–23)
CO2: 23 mmol/L (ref 22–32)
Calcium: 8.5 mg/dL — ABNORMAL LOW (ref 8.9–10.3)
Chloride: 111 mmol/L (ref 98–111)
Creatinine, Ser: 0.47 mg/dL (ref 0.44–1.00)
GFR, Estimated: >60 mL/min (ref >60)
Glucose, Bld: 96 mg/dL (ref 70–99)
Potassium: 4.7 mmol/L (ref 3.5–5.1)
Sodium: 141 mmol/L (ref 135–145)

## 2023-05-03 LAB — GLUCOSE, CAPILLARY
Glucose-Capillary: 119 mg/dL — ABNORMAL HIGH (ref 70–99)
Glucose-Capillary: 147 mg/dL — ABNORMAL HIGH (ref 70–99)
Glucose-Capillary: 77 mg/dL (ref 70–99)
Glucose-Capillary: 84 mg/dL (ref 70–99)
Glucose-Capillary: 88 mg/dL (ref 70–99)

## 2023-05-03 LAB — CBC
HCT: 33.7 % — ABNORMAL LOW (ref 36.0–46.0)
Hemoglobin: 11.1 g/dL — ABNORMAL LOW (ref 12.0–15.0)
MCH: 31.5 pg (ref 26.0–34.0)
MCHC: 32.9 g/dL (ref 30.0–36.0)
MCV: 95.7 fL (ref 80.0–100.0)
Platelets: 224 10*3/uL (ref 150–400)
RBC: 3.52 MIL/uL — ABNORMAL LOW (ref 3.87–5.11)
RDW: 15.9 % — ABNORMAL HIGH (ref 11.5–15.5)
WBC: 15.7 10*3/uL — ABNORMAL HIGH (ref 4.0–10.5)
nRBC: 0 % (ref 0.0–0.2)

## 2023-05-03 LAB — MAGNESIUM: Magnesium: 2.1 mg/dL (ref 1.7–2.4)

## 2023-05-03 LAB — PHOSPHORUS: Phosphorus: 2.6 mg/dL (ref 2.5–4.6)

## 2023-05-03 MED ORDER — APIXABAN 5 MG PO TABS
10.0000 mg | ORAL_TABLET | Freq: Two times a day (BID) | ORAL | Status: DC
  Administered 2023-05-03 – 2023-05-06 (×4): 10 mg via ORAL
  Filled 2023-05-03 (×6): qty 2

## 2023-05-03 MED ORDER — LISINOPRIL 10 MG PO TABS: 10.0000 mg | ORAL_TABLET | Freq: Every day | ORAL | Status: DC

## 2023-05-03 MED ORDER — LISINOPRIL 5 MG PO TABS
5.0000 mg | ORAL_TABLET | Freq: Every day | ORAL | Status: DC
  Administered 2023-05-04: 5 mg via ORAL
  Filled 2023-05-03 (×2): qty 1

## 2023-05-03 MED ORDER — APIXABAN 5 MG PO TABS: 5.0000 mg | ORAL_TABLET | Freq: Two times a day (BID) | ORAL | Status: DC

## 2023-05-03 MED ORDER — FUROSEMIDE 20 MG PO TABS
20.0000 mg | ORAL_TABLET | Freq: Every day | ORAL | Status: DC
  Administered 2023-05-03 – 2023-05-04 (×2): 20 mg via ORAL
  Filled 2023-05-03 (×2): qty 1

## 2023-05-03 NOTE — Progress Notes (Signed)
Inpatient Rehabilitation Admissions Coordinator   I spoke with daughter by phone. She and pt's brother are co health Care POA's. Daughter coming from [facility] and prefers SNF in [place] co. Patient does not have 24/7 caregiver supports at present. Daughter states she may be looking at SNF then ALF if cognition does not improve to return home alone. I have updated Acute team and TOC. We will sign off.  Ottie Glazier, RN, MSN Rehab Admissions Coordinator [phone] 05/03/2023 12:03 PM

## 2023-05-03 NOTE — Progress Notes (Signed)
MEWS Progress Note  Patient Details Name: Teresa Sherman MRN: [id number] DOB: 28-Oct-1945 Today's Date: 05/03/2023   MEWS Flowsheet Documentation:  Assess: MEWS Score Temp: 98.3 F (36.8 C) BP: 113/77 MAP (mmHg): 89 Pulse Rate: (!) 104 ECG Heart Rate: 83 Resp: (!) 24 Level of Consciousness: Alert SpO2: 91 % O2 Device: Nasal Cannula Patient Activity (if Appropriate): In bed Heater temperature: 35.6 F (2 C) O2 Flow Rate (L/min): 2 L/min FiO2 (%): 30 % Assess: MEWS Score MEWS Temp: 0 MEWS Systolic: 0 MEWS Pulse: 1 MEWS RR: 1 MEWS LOC: 0 MEWS Score: 2 MEWS Score Color: Yellow Assess: SIRS CRITERIA SIRS Temperature : 0 SIRS Respirations : 1 SIRS Pulse: 0 SIRS WBC: 0 SIRS Score Sum : 1 Assess: if the MEWS score is Yellow or Red Were vital signs accurate and taken at a resting state?: Yes Does the patient meet 2 or more of the SIRS criteria?: No Does the patient have a confirmed or suspected source of infection?: Yes MEWS guidelines implemented : Yes, yellow Treat MEWS Interventions: Considered administering scheduled or prn medications/treatments as ordered Take Vital Signs Increase Vital Sign Frequency : Yellow: Q2hr x1, continue Q4hrs until patient remains green for 12hrs Escalate MEWS: Escalate: Yellow: Discuss with charge nurse and consider notifying provider and/or RRT Notify: Charge Nurse/RN Name of Charge Nurse/RN Notified: Debi Pinkerton RN      Teresa Sherman 05/03/2023, 8:54 AM

## 2023-05-03 NOTE — Plan of Care (Signed)
  Problem: Fluid Volume: Goal: Hemodynamic stability will improve Outcome: Progressing   Problem: Clinical Measurements: Goal: Signs and symptoms of infection will decrease Outcome: Progressing   Problem: Respiratory: Goal: Complications related to the disease process, condition or treatment will be avoided or minimized Outcome: Progressing   Problem: Clinical Measurements: Goal: Respiratory complications will improve Outcome: Progressing

## 2023-05-03 NOTE — Progress Notes (Signed)
Physical Therapy Treatment Patient Details Name: Teresa Sherman MRN: [id number] DOB: Dec 05, 1945 Today's Date: 05/03/2023   History of Present Illness Pt is a 77 y/o F admitted on 04/11/23 for sepsis, acute metabolic encephalopathy, acute hypoxic respiratory failure in the setting of COVID-19 PNA & suspected superimposed bacterial PNA. Pt found to have LLE DVT on 04/22/23, intubated on 04/27/23, extubated 04/29/23. PMH: stroke, depression, anxiety    PT Comments  Pt found supine in bed with daughter in room. Pt willing to work with therapy this session, putting forth good effort throughout. Pt continues to be confused and not oriented, per daughters report pt's mental status is far from baseline, and that she is typically oriented and independent with housework. Pt SpO2 was monitored throughout on RA, remaining in low 90's at rest, dropping slightly with activity but quickly recovering to low 90's. Pt continues to be mod A +2 for bed mobility. Pt performing STS transfers requiring Mod A; +2 only for AD management and safety. Once standing pt able to take a few steps forwards with RW and Mod A that quickly progressed to Max A to sit in chair. Consistent cues for safety and hand placement during mobility as well as redirections provided throughout session. Pt will benefit from continued PT services upon discharge to safely address deficits listed in patient problem list for decreased caregiver assistance and eventual return to PLOF.    If plan is discharge home, recommend the following: Assistance with cooking/housework;Direct supervision/assist for medications management;Direct supervision/assist for financial management;Assist for transportation;Help with stairs or ramp for entrance;Supervision due to cognitive status;Two people to help with walking and/or transfers;Two people to help with bathing/dressing/bathroom   Can travel by private vehicle     No  Equipment Recommendations  Other (comment)  (TBD at next venue of care)    Recommendations for Other Services       Precautions / Restrictions Precautions Precautions: Fall Restrictions Weight Bearing Restrictions: No     Mobility  Bed Mobility Overal bed mobility: Needs Assistance Bed Mobility: Supine to Sit, Sit to Supine     Supine to sit: Mod assist, HOB elevated, +2 for physical assistance Sit to supine: Mod assist, +2 for physical assistance, HOB elevated        Transfers Overall transfer level: Needs assistance Equipment used: Rolling walker (2 wheels) Transfers: Sit to/from Stand Sit to Stand: Mod assist, +2 safety/equipment           General transfer comment: x2 standing trials, able to take a few steps with second trial    Ambulation/Gait Ambulation/Gait assistance: Max assist Gait Distance (Feet): 4 Feet Assistive device: Rolling walker (2 wheels)   Gait velocity: decreased     General Gait Details: Pt initally mod A but regressed to Max A after a few steps. Pt able to take a few steps forwards and turn to sit in chair. Pt given cues to keep walker out in front due to pt significant posterior lean, also given cues for AD management and to lean forwards   Stairs             Wheelchair Mobility     Tilt Bed    Modified Rankin (Stroke Patients Only)       Balance Overall balance assessment: Needs assistance Sitting-balance support: Feet supported, Bilateral upper extremity supported Sitting balance-Leahy Scale: Fair Sitting balance - Comments: once sit upright pt able to manage balance with BUE support   Standing balance support: Bilateral upper extremity supported, Reliant on  assistive device for balance, During functional activity Standing balance-Leahy Scale: Poor Standing balance comment: Pt showing significant posterior lean, cues provided to lean forwards for better anterior weight shift.                            Cognition Arousal: Alert Behavior During  Therapy: WFL for tasks assessed/performed Overall Cognitive Status: Impaired/Different from baseline                                 General Comments: Pt's daughter in room, reports pt is typically independent with housework, typically oriented to time and place as well.        Exercises      General Comments        Pertinent Vitals/Pain Pain Assessment Pain Assessment: Faces Faces Pain Scale: No hurt    Home Living                          Prior Function            PT Goals (current goals can now be found in the care plan section) Progress towards PT goals: Progressing toward goals    Frequency    Min 1X/week      PT Plan      Co-evaluation              AM-PAC PT "6 Clicks" Mobility   Outcome Measure  Help needed turning from your back to your side while in a flat bed without using bedrails?: A Lot Help needed moving from lying on your back to sitting on the side of a flat bed without using bedrails?: A Lot Help needed moving to and from a bed to a chair (including a wheelchair)?: A Lot Help needed standing up from a chair using your arms (e.g., wheelchair or bedside chair)?: A Lot Help needed to walk in hospital room?: A Lot Help needed climbing 3-5 steps with a railing? : Total 6 Click Score: 11    End of Session Equipment Utilized During Treatment: Gait belt Activity Tolerance: Patient tolerated treatment well Patient left: in chair;with chair alarm set;with family/visitor present Nurse Communication: Mobility status PT Visit Diagnosis: Unsteadiness on feet (R26.81);Muscle weakness (generalized) (M62.81);Other abnormalities of gait and mobility (R26.89);Difficulty in walking, not elsewhere classified (R26.2)     Time: 1340-1403 PT Time Calculation (min) (ACUTE ONLY): 23 min  Charges:                            Cecile Sheerer, SPT 05/03/23, 3:50 PM

## 2023-05-03 NOTE — Progress Notes (Signed)
PHARMACY CONSULT NOTE - ELECTROLYTES  Pharmacy Consult for Electrolyte Monitoring and Replacement   Recent Labs: Height: 5' (152.4 cm) Weight: 53.2 kg (117 lb 4.6 oz) IBW/kg (Calculated) : 45.5 Estimated Creatinine Clearance: 43.6 mL/min (by C-G formula based on SCr of 0.47 mg/dL). Potassium (mmol/L)  Date Value  05/03/2023 4.7  11/26/2013 3.8   Magnesium (mg/dL)  Date Value  81/19/1478 2.1   Calcium (mg/dL)  Date Value  29/56/2130 8.5 (L)   Calcium, Total (mg/dL)  Date Value  86/57/8469 9.5   Albumin (g/dL)  Date Value  62/95/2841 1.9 (L)  04/30/2015 4.3  11/26/2013 3.9   Phosphorus (mg/dL)  Date Value  32/44/0102 2.6   Sodium (mmol/L)  Date Value  05/03/2023 141  04/30/2015 140  11/26/2013 142   Assessment  Teresa Sherman is a 77 y.o. female presenting with Severe sepsis secondary to pneumonia. PMH significant for CVA, Anemia, and depression. Pharmacy has been consulted to monitor and replace electrolytes.  Diet/Nutrition: Dysphagia 2 diet orderedJuven q packet BID.   Goal of Therapy: Electrolytes WNL  Plan:  No replacement currently indicated. F/u tomorrow with AM labs.   Thank you for involving pharmacy in this patient's care.   Bettey Costa, PharmD Clinical Pharmacist 05/03/2023 7:35 AM

## 2023-05-03 NOTE — TOC Progression Note (Signed)
Transition of Care [facility]) - Progression Note    Patient Details  Name: Teresa Sherman MRN: [id number] Date of Birth: 18-Sep-1945  [facility] of [facility]) CM/SW Contact  Allena Katz, LCSW Phone Number: 05/03/2023, 12:11 PM  Clinical Narrative:   CSW spoke with brother who reports since CIR is not an option he is agreeable to peak.          Expected Discharge Plan and Services                                               Social Determinants of Health (SDOH) Interventions SDOH Screenings   Food Insecurity: No Food Insecurity (04/13/2023)  Housing: Low Risk  (04/13/2023)  Transportation Needs: No Transportation Needs (04/13/2023)  Utilities: Not At Risk (04/13/2023)  Financial Resource Strain: Low Risk  (06/14/2021)   Received from [facility], [facility]  Physical Activity: Sufficiently Active (10/30/2021)   Received from [facility]  Social Connections: Socially Isolated (10/30/2021)   Received from [facility]  Stress: No Stress Concern Present (10/30/2021)   Received from [facility]  Tobacco Use: Low Risk  (04/11/2023)  Health Literacy: Medium Risk (10/30/2021)   Received from [facility]    Readmission Risk Interventions     No data to display

## 2023-05-03 NOTE — Progress Notes (Signed)
[facility] CLINIC CARDIOLOGY PROGRESS NOTE   Patient ID: Teresa Sherman MRN: [id number] DOB/AGE: Jan 25, 1946 77 y.o.  Admit date: 04/11/2023 Referring Physician Dr. Joylene Igo Primary Physician Dr [name]  Primary Cardiologist none Reason for Consultation LV dysfunction  HPI: Teresa Sherman is a 77 y.o. female with a past medical history of CVA who presented to the ED on 04/11/2023 for SOB. She was seen by cardiology early in her admission for elevated troponin, NSTEMI in setting of covid infection. Cardiology reconsulted 8/30 for additional evaluation of severe LV dysfunction.  Interval History:  -Patient feeling ok this AM, more oriented today than she has been. She is without complaint. -BP remains stable and tolerating GDMT. HR in the 90-100s. -Continues to have good UOP, renal function stable.  -Noted to be hypoxic with SpO2 in the 80s this AM, started on 2L [place].   Review of systems complete and found to be negative unless listed above    Vitals:   05/02/23 2013 05/03/23 0431 05/03/23 0500 05/03/23 0831  BP: 107/67 128/83  113/77  Pulse:      Resp: 20 20  (!) 24  Temp: 99.9 F (37.7 C) 97.6 F (36.4 C)  98.3 F (36.8 C)  TempSrc: Oral   Oral  SpO2: 93% (!) 84%  91%  Weight:   53.2 kg   Height:         Intake/Output Summary (Last 24 hours) at 05/03/2023 0910 Last data filed at 05/02/2023 1144 Gross per 24 hour  Intake 0 ml  Output 300 ml  Net -300 ml     PHYSICAL EXAM General: Well-appearing elderly female, in no acute distress sitting upright in hospital bed eating breakfast with help of staff. HEENT: Normocephalic and atraumatic. Neck: No JVD.  Lungs: Normal respiratory effort on 2L [place]. Coarse breath sounds bilaterally.  Heart: HRRR. Normal S1 and S2 without gallops or murmurs. Radial & DP pulses 2+ bilaterally. Abdomen: Non-distended appearing.  Msk: Normal strength and tone for age. Extremities: No clubbing, cyanosis or edema.  Warm & well  perfused.   LABS: Basic Metabolic Panel: Recent Labs    05/02/23 0519 05/03/23 0401  NA 138 141  K 3.0* 4.7  CL 108 111  CO2 20* 23  GLUCOSE 86 96  BUN 18 24*  CREATININE <0.30* 0.47  CALCIUM 7.9* 8.5*  MG 1.9 2.1  PHOS 2.8 2.6   Liver Function Tests: No results for input(s): "AST", "ALT", "ALKPHOS", "BILITOT", "PROT", "ALBUMIN" in the last 72 hours.  No results for input(s): "LIPASE", "AMYLASE" in the last 72 hours. CBC: Recent Labs    05/01/23 0625 05/02/23 0519 05/03/23 0401  WBC 11.0* 11.8* 15.7*  NEUTROABS 8.7* 10.3*  --   HGB 9.7* 10.4* 11.1*  HCT 28.3* 30.8* 33.7*  MCV 93.1 92.2 95.7  PLT 191 190 224   Cardiac Enzymes: No results for input(s): "CKTOTAL", "CKMB", "CKMBINDEX", "TROPONINIHS" in the last 72 hours. BNP: No results for input(s): "BNP" in the last 72 hours.  D-Dimer: No results for input(s): "DDIMER" in the last 72 hours. Hemoglobin A1C: No results for input(s): "HGBA1C" in the last 72 hours.  Fasting Lipid Panel: No results for input(s): "CHOL", "HDL", "LDLCALC", "TRIG", "CHOLHDL", "LDLDIRECT" in the last 72 hours. Thyroid Function Tests: No results for input(s): "TSH", "T4TOTAL", "T3FREE", "THYROIDAB" in the last 72 hours.  Invalid input(s): "FREET3" Anemia Panel: No results for input(s): "VITAMINB12", "FOLATE", "FERRITIN", "TIBC", "IRON", "RETICCTPCT" in the last 72 hours.   No results found.  ECHO 04/22/2023:  1. Left ventricular ejection fraction, by estimation, is 30 to 35%. Left ventricular ejection fraction by 3D volume is 31 %. Left ventricular ejection fraction by PLAX is 34 %. The left ventricle has moderately decreased function. The left ventricle demonstrates global hypokinesis. Left ventricular diastolic parameters are consistent with Grade I diastolic dysfunction (impaired relaxation).   2. Right ventricular systolic function is mildly reduced. The right ventricular size is normal.   3. The mitral valve is normal in  structure. Mild mitral valve regurgitation. No evidence of mitral stenosis.   4. The aortic valve has an indeterminant number of cusps. Aortic valve regurgitation is moderate. No aortic stenosis is present.   5. The inferior vena cava is normal in size with greater than 50% respiratory variability, suggesting right atrial pressure of 3 mmHg.   TELEMETRY reviewed by me 05/03/23: not on tele  EKG reviewed by me 05/03/23: NSR rate 92 bpm, T wave inversions in aVL, V2  DATA reviewed by me 05/03/23: last 24h vitals tele labs imaging I/O, hospitalist progress notes, respiratory notes, nursing notes  Principal Problem:   Severe sepsis (HCC) Active Problems:   Depression with anxiety   History of stroke   CAP (community acquired pneumonia)   Pneumonia due to COVID-19 virus   Rhabdomyolysis   AKI (acute kidney injury) (HCC)   Acute metabolic encephalopathy   Urinary tract infection   Anemia   Acute on chronic respiratory failure with hypoxia (HCC)   Sepsis with acute hypoxic respiratory failure without septic shock (HCC)   Acute deep vein thrombosis (DVT) of left peroneal vein (HCC)   Acute HFrEF (heart failure with reduced ejection fraction) (HCC)   Elevated troponin   Hypophosphatemia   Dementia (HCC)   Generalized weakness   Dysphagia    ASSESSMENT AND PLAN: Teresa Sherman is a 77 y.o. female with a past medical history of CVA who presented to the ED on 04/11/2023 for SOB. She was seen by cardiology early in her admission for elevated troponin, NSTEMI in setting of covid infection. Cardiology reconsulted 8/30 for additional evaluation of severe LV dysfunction.  # Takotsubo cardiomyopathy vs covid-19 viral myocarditis # Acute heart failure reduced ejection fraction # NSTEMI Patient admitted with COVID-19 infection found to have NSTEMI on hospital day 5. Echo revealed severely reduced LV function. Was started on GDMT after BP was able to tolerate and doses titrated. Developed  hypotension requiring pressor support on 9/4. Now weaned off of pressors and transferred out off ICU. -Start lasix 20 mg daily. Continue farxiga. -Continue lisinopril, metoprolol.  -Plan to uptitrate doses as HR/BP and renal function allow.  # Acute metabolic encephalopathy Patient oriented x1 this AM, baseline oriented x3.  -Management per primary  # Septic shock 2/2 ESBL UTI # Sinus tachycardia, resolved Patient with rates elevated 9/3, up to 150s. Rates improved with sedation while in the ICU.  -Gentle IV hydration, now net negative 3.6 L.  -Meropenem and vancomycin now discontinued  # Acute hypoxic respiratory failure # Covid-19 pneumonia Had increased oxygen requirement on BiPAP earlier in admission then weaned to room air by 9/3. Respiratory status declined overnight on 9/4 requiring intubation and mechanical ventilation. Extubated on 9/6. Now on room air. -Management per primary.   # Acute DVT # Anemia DVT found in LLE on [place] 04/22/2023.  -Eliquis switched to heparin on 9/4 then held due to anemia requiring blood transfusion overnight on 9/4. Hemoglobin now uptrending.  -Per primary, plan to restart eliquis as hemoglobin improves -Continue  to monitor H&H closely.  This patient's case was discussed and created with Dr. Juliann Pares and he is in agreement.  Signed:  Gale Journey, PA-C  05/03/2023, 9:10 AM [facility] Cardiology

## 2023-05-03 NOTE — TOC Progression Note (Signed)
Transition of Care [facility]) - Progression Note    Patient Details  Name: Teresa Sherman MRN: [id number] Date of Birth: 10-Aug-1946  Transition [facility]) CM/SW Contact  Allena Katz, LCSW Phone Number: 05/03/2023, 12:04 PM  Clinical Narrative:   Per CIR admissions, pt does not want [facility] due to not having 24/7 care after discharge. Family prefers [facility] or [employer]. Both have declined patient. CSW will follow up with family on additional preferences.          Expected Discharge Plan and Services                                               Social Determinants of Health (SDOH) Interventions SDOH Screenings   Food Insecurity: No Food Insecurity (04/13/2023)  Housing: Low Risk  (04/13/2023)  Transportation Needs: No Transportation Needs (04/13/2023)  Utilities: Not At Risk (04/13/2023)  Financial Resource Strain: Low Risk  (06/14/2021)   Received from [facility], [facility]  Physical Activity: Sufficiently Active (10/30/2021)   Received from [facility]  Social Connections: Socially Isolated (10/30/2021)   Received from [facility]  Stress: No Stress Concern Present (10/30/2021)   Received from [facility]  Tobacco Use: Low Risk  (04/11/2023)  Health Literacy: Medium Risk (10/30/2021)   Received from [facility] [place]    Readmission Risk Interventions     No data to display

## 2023-05-03 NOTE — Progress Notes (Signed)
Progress Note   Patient: Teresa Sherman WUJ:[id number] DOB: [birth year] DOA: 04/11/2023     22 DOS: the patient was seen and examined on 05/03/2023   Brief hospital course: 77 y.o. female with medical history significant for Prior stroke, depression with anxiety, prior ESBL E. coli, admitted 04/11/2023 with Sepsis, Acute Metabolic Encephalopathy, and Acute Hypoxic Respiratory Failure in the setting of COVID-19 Pneumonia and suspected superimposed bacterial pneumonia.   She was found after wellness check by her niece when family was not able to reach patient for two days.  She was found somnolent and confused in her bed and had unintelligible mumbling but without focal neurologic deficits. She is reportedly oriented x 3 at baseline. She was also noted by EMS to have foul-smelling urine.  Initial Vitals: HR to 115, RR 23 with O2 sat 90 to 95% on room air, BP 101/83 improving to 126/87 with IV fluids  Significant Labs: WBC of 8.1 but lactic acid 2.4 and procalcitonin 7.73. Hemoglobin 7.8 with baseline around 13 a year ago. CMP notable for creatinine of 1.78, up from 0.51 a year ago, AST/ALT 45/23. COVID-positive. [id number].  Imaging Chest X-ray showed Right lower airspace opacity.   Pt was admitted to hospitalist service initially.   Prolonged and complicated hospital course as outlined below:  Significant Hospital Events 8/19: Admitted by TRH. 8/21-8/27 (per [facility]): severe sepsis secondary to COVID19 pneumonia & possible co-bacterial infection. Completed IV rocephin, azithromycin x 5 days, later given Unasyn >> Zosyn.  Pt was moved to stepdown placed on BiPAP >> heated HFNC.  Discussed code status w/ pt and she wants to continue full code.  8/23: Developed hypotension and increased fiO2 requirements, received IF fluids and albumin, moved to Stepdown.  Troponin elevated, placed on Heparin gtt, Cardiology consulted 8/24: Elevated Troponin felt to be due to demand ischemia, BP too soft for  GDMT 8/25: moved back to Progressive Cardiac unit 8/26: Back to Stepdown due to AMS and removing supplemental O2 with desaturations.  8/29: Increasing FiO2 requirements (up to 90% FiO2 via HHFNC).  PCCM consulted with high risk for intubation. 8/30: Decreased HHFNC to 65%/50L. Remains delirious. Found to have LLE DVT. 8/31: did not tolerate BPAP overnight. Remains on significant HHFNC. 9/1: tolerated BPAP overnight. Down to 50%/45L. PCCM signed off to [facility] 9/4: PCCM re-consulted for increased agitation, persistent tachycardia despite Dig, metoprolol and Amiodarone, also hypotensive  despite IVFs bolus now requiring vasopressor. SEVERE HYPOXIA REQUIRING INTUBATION 9/6: Extubated 9/7: TRH resumed care of patient. On 4 L/min [place] O2.  SLP evaluated, dysphagia-1 diet started.   9/8 - weaned to room air, placed back on 2 L/min O2 due to borderline O2 sats.  PT/OT evaluations. Pt hallucinating, delirious. 9/9-10 - very slowly improving delirium. Ongoing cough. On pureed diet (was regular before intubation).  Repeating CXR with rising WBC and persistent cough.     Assessment and Plan: * Severe sepsis (HCC) POA, resolved. Severe sepsis criteria include tachycardia and tachypnea with soft blood pressure, AKI, AMS, lactic acidosis with elevated procalcitonin Due to COVID pneumonia with secondary bacterial infection, UTI --Completed antibiotics  Acute deep vein thrombosis (DVT) of left peroneal vein [facility]) Vascular was consulted for consideration of IVC filter. See consult note of 04/29/23.  Procedure was deferred due to goals of care and code status made DNR. --Started full dose Lovenox & Hbg steadily improving --Will transition Eliquis today --If pt does not tolerate anticoagulation, reach back out to Vascular for IVC filter placement --Monitor anemia closely - improving --  Monitor for any signs of bleeding  Acute on chronic respiratory failure with hypoxia (HCC) Resolved. Has ongoing wet-sounding  cough and new dysphagia. Due to Covid Pneumonia, likely also bacterial.  Required intubation, extubated on 9/6.   Now weaned to room air. --Chest x-ray due to ongoing cough in setting of dysphagia, worsening leukocytosis (?Aspirating) --O2 as needed --Maintain sats > 90% --Mgmt of underlying respiratory conditions as outlined. --Pulmonary hygiene  Acute metabolic encephalopathy Underlying Dementia vs [facility] Delirium Initially related to sepsis which is now resolved.  Ongoing confusion, hallucinations consistent with delirium, not unusual given prolonged admission complicated by ICU/ intubation. At baseline pt is oriented x 3 per family with only mild cognitive impairment.  CT head negative. --Mgmt of underlying issues as outlined --Supportive care --Delirium precautions --Resume trazodone at bedtime PRN --AVOID BENZO's due to paradoxical worsened agitation when given earlier in course of admission --Off Precedex  Pneumonia due to COVID-19 virus Right lower lobe pneumonia Completed Rocephin and azithromycin --Off COVID precautions --Pulmonary hygiene --O2 per protocol --Symptomatic care as needed --Completed steroids 9/10  CAP (community acquired pneumonia) Completed antibiotics. Suspect secondary bacterial pneumonia in setting of Covid-19  Hypophosphatemia Resolved with replacement 9/7, 9/8 Monitor & replace PRN  Acute HFrEF (heart failure with reduced ejection fraction) (HCC) Elevated Troponin / Demand Ischemia ?Covid-related myocarditis Echocardiogram 04/17/23: LVEF severely depressed, the proximal 1/4 of LV thickens but rest of ventricle is severely hypokinetic, concerning for Takotsubo's Cardiomyopathy. Akinesis/dyskinesis of distal 2/3 of RV with aneurysmal dilation in mid region, RV sytolic function moderately reduced, moderate Aortic regurgitation -Maintain MAP >65 --BP's stable off pressors --hs-Troponin peaked at 3,734 --Diuresis as needed, as BP and renal  function permits ~ Lasix currently held  -Seen by Cardiology earlier in course, signed off on 8/25 , re-consulted on 8/30 to evaluate for severe LV dysfunction~ continue ASA and statin, Restarted GDMT due to BP and HR, now held due to hypotension --9/9 - Cardiology started low dose lisinopril, metoprolol with BP hold parameters, Farxiga - continue -- 9/10 - Cardiology added Lasix 20 mg PO daily --Plan to up-titrate GDMT as BP allows   Anemia Improving. Uncertain acuity.  Hemoglobin 7.8 on admission, down from 13 a year ago. 9/7 - ICU RN reports pt has been having dark stools.  Anticoagulation was held and she was given 2 units pRBC's on 9/4 and 9/5.   --Monitor for bleeding signs --Daily CBC's Hbg improving steadily, 11.1 this AM  AKI (acute kidney injury) (HCC) Resolved.  Creatinine 1.78 on admission.  up from baseline of 0.51, likely prerenal and related to sepsis. Renal function normalized. --Monitor BMP's  History of stroke ASA was held and due to anemia requiring transfusion, resume. Continue statin.  Depression with anxiety Insomnia Resume home trazodone at bedtime PRN  Urinary tract infection History of Pseudomonas and ESBL E. coli UTI 11/2021 Urine reportedly foul-smelling with EMS.  Urine culture insignificant growth. Completed antibiotics for pneumonia as outlined.   Rhabdomyolysis CK near 1500 on admission, unknown whether traumatic or not.  Patient was found on the bed. Resolved with IV hydration.  Dysphagia Seems related to acute delirium. Pt was seen by SLP and on regular diet earlier this admission, pre-intubation.  Since extubation, pt's mental status remains altered and poor awareness in general. Ongoing wet sounding cough. --SLP following, appreciate recommendations --On pureed diet with nectar-thickened liquids.  Diet advancement per SLP --Assist with feeding patient  Generalized weakness Due to prolonged & complicated hospitalization. --PT/OT  evaluations --Fall precautions  Dementia (HCC) Resume Aricept Delirium precautions AVOID BENZO's  Sepsis with acute hypoxic respiratory failure without septic shock (HCC) POA, now resolved.        Subjective: Pt seen awake resting in bed, daughter at bedside.  Pt does not seem to be hallucinating today, but remains confused.  She perseverates on going home, and that she "wants all her belongings back".  Seems concerned about the status of her home and property.  Pt has no complaints except wanting to go home.  Delirium seems slightly / slowly improved over past 2-3 days.   Physical Exam: Vitals:   05/03/23 0431 05/03/23 0500 05/03/23 0831 05/03/23 1245  BP: 128/83  113/77 97/67  Pulse:   (!) 112 (!) 105  Resp: 20  (!) 24 19  Temp: 97.6 F (36.4 C)  98.3 F (36.8 C) 98.7 F (37.1 C)  TempSrc:   Oral   SpO2: (!) 84%  91% 91%  Weight:  53.2 kg    Height:       General exam: awake, alert, no acute distress, confused, frail and chronically ill appearing HEENT: moist mucus membranes, hearing grossly normal  Respiratory system: lungs clear but coarse upper airway secretion sounds, diminished bases due to poor inspiratory volumes, normal respiratory effort on room air Cardiovascular system: normal S1/S2, RRR, no pedal edema.   Gastrointestinal system: soft, NT, ND Central nervous system: A&O x self. no gross focal neurologic deficits, normal speech Extremities: moves all, no edema, normal tone Skin: dry, intact, normal temperature Psychiatry: normal mood, congruent affect, abnormal judgement and insight, visual hallucinations noted    Data Reviewed:  Notable labs --- BUN 24, Ca 8.5,  Hbg 8.4 >> 8.9 >> 9.7 >> 10.4 >> 11.1 WBC 11.8 >> 15.7   Micro:  Urine culture 9/4 - insignificant growth Blood cultures 9/5 - negative to date Sputum culture - normal respiratory flora  C-diff negative 04/13/23 GI panel negative 04/13/23  Blood cultures 8/19 -- +Staph hominis likely  contaminant Repeat blood cultures 8/21 - negative & final  Covid-19 POSITIVE on 04/11/23 -- now off precautions    Family Communication: Daughter Victorino Dike updated at bedside this afternoon (9/10)  Disposition: Status is: Inpatient Remains inpatient appropriate because: persistent encephalopathy and new dysphagia. Needs further improvement before stable for d/c.    Planned Discharge Destination: Skilled nursing facility    Time spent: 38 minutes  Author: Pennie Banter, DO 05/03/2023 1:59 PM  For on call review [email or web address].

## 2023-05-04 DIAGNOSIS — R6520 Severe sepsis without septic shock: Secondary | ICD-10-CM | POA: Diagnosis not present

## 2023-05-04 DIAGNOSIS — A419 Sepsis, unspecified organism: Secondary | ICD-10-CM | POA: Diagnosis not present

## 2023-05-04 LAB — GLUCOSE, CAPILLARY
Glucose-Capillary: 132 mg/dL — ABNORMAL HIGH (ref 70–99)
Glucose-Capillary: 134 mg/dL — ABNORMAL HIGH (ref 70–99)
Glucose-Capillary: 142 mg/dL — ABNORMAL HIGH (ref 70–99)
Glucose-Capillary: 88 mg/dL (ref 70–99)
Glucose-Capillary: 91 mg/dL (ref 70–99)
Glucose-Capillary: 97 mg/dL (ref 70–99)

## 2023-05-04 LAB — BASIC METABOLIC PANEL
Anion gap: 5 (ref 5–15)
BUN: 33 mg/dL — ABNORMAL HIGH (ref 8–23)
CO2: 23 mmol/L (ref 22–32)
Calcium: 7.9 mg/dL — ABNORMAL LOW (ref 8.9–10.3)
Chloride: 113 mmol/L — ABNORMAL HIGH (ref 98–111)
Creatinine, Ser: 0.35 mg/dL — ABNORMAL LOW (ref 0.44–1.00)
GFR, Estimated: >60 mL/min (ref >60)
Glucose, Bld: 81 mg/dL (ref 70–99)
Potassium: 3.6 mmol/L (ref 3.5–5.1)
Sodium: 141 mmol/L (ref 135–145)

## 2023-05-04 LAB — CBC
HCT: 26.8 % — ABNORMAL LOW (ref 36.0–46.0)
Hemoglobin: 8.9 g/dL — ABNORMAL LOW (ref 12.0–15.0)
MCH: 31.4 pg (ref 26.0–34.0)
MCHC: 33.2 g/dL (ref 30.0–36.0)
MCV: 94.7 fL (ref 80.0–100.0)
Platelets: 178 10*3/uL (ref 150–400)
RBC: 2.83 MIL/uL — ABNORMAL LOW (ref 3.87–5.11)
RDW: 15.9 % — ABNORMAL HIGH (ref 11.5–15.5)
WBC: 8.8 10*3/uL (ref 4.0–10.5)
nRBC: 0 % (ref 0.0–0.2)

## 2023-05-04 LAB — PHOSPHORUS: Phosphorus: 2.7 mg/dL (ref 2.5–4.6)

## 2023-05-04 LAB — MAGNESIUM: Magnesium: 2.2 mg/dL (ref 1.7–2.4)

## 2023-05-04 MED ORDER — GUAIFENESIN 100 MG/5ML PO LIQD
10.0000 mL | Freq: Two times a day (BID) | ORAL | Status: DC
  Administered 2023-05-04 – 2023-05-06 (×4): 10 mL via ORAL
  Filled 2023-05-04 (×5): qty 10

## 2023-05-04 MED ORDER — FUROSEMIDE 20 MG PO TABS: 20.0000 mg | ORAL_TABLET | Freq: Every day | ORAL | Status: DC | PRN

## 2023-05-04 NOTE — TOC Progression Note (Signed)
Transition of Care [facility]) - Progression Note    Patient Details  Name: Teresa Sherman MRN: [id number] Date of Birth: April 08, 1946  Transition of Care [facility]) CM/SW Contact  Allena Katz, LCSW Phone Number: 05/04/2023, 2:42 PM  Clinical Narrative:  MD stating pt is medically ready for [place] to start auth tomorrow. Tammy with [facility] notified to start auth for tomorrow.          Expected Discharge Plan and Services                                               Social Determinants of Health (SDOH) Interventions SDOH Screenings   Food Insecurity: No Food Insecurity (04/13/2023)  Housing: Low Risk  (04/13/2023)  Transportation Needs: No Transportation Needs (04/13/2023)  Utilities: Not At Risk (04/13/2023)  Financial Resource Strain: Low Risk  (06/14/2021)   Received from [facility], [facility]  Physical Activity: Sufficiently Active (10/30/2021)   Received from [facility]  Social Connections: Socially Isolated (10/30/2021)   Received from [facility]  Stress: No Stress Concern Present (10/30/2021)   Received from [facility]  Tobacco Use: Low Risk  (04/11/2023)  Health Literacy: Medium Risk (10/30/2021)   Received from [facility]    Readmission Risk Interventions     No data to display

## 2023-05-04 NOTE — Progress Notes (Signed)
[facility] CARDIOLOGY PROGRESS NOTE   Patient ID: Teresa Sherman MRN: [id number] DOB/AGE: March 15, 1946 77 y.o.  Admit date: 04/11/2023 Referring Physician Dr. Joylene Igo Primary Physician Dr [name]  Primary Cardiologist none Reason for Consultation LV dysfunction  HPI: Teresa Sherman is a 77 y.o. female with a past medical history of CVA who presented to the ED on 04/11/2023 for SOB. She was seen by cardiology early in her admission for elevated troponin, NSTEMI in setting of covid infection. Cardiology reconsulted 8/30 for additional evaluation of severe LV dysfunction.  Interval History:  -Patient feeling "bad" this morning, complaining of productive cough. Remains on supplemental oxygen.  -Denies any chest pain, shortness of breath, palpitations.  -She is oriented x3 this AM.  -No documented output yesterday but weight remains stable.   Review of systems complete and found to be negative unless listed above    Vitals:   05/03/23 2013 05/04/23 0410 05/04/23 0716 05/04/23 0755  BP: 98/63 102/67  (!) 99/59  Pulse: 86 72  63  Resp: 18 20  20   Temp: 98.1 F (36.7 C) 98.8 F (37.1 C)  97.7 F (36.5 C)  TempSrc:    Oral  SpO2: (!) 89% 96%  96%  Weight:   52.6 kg   Height:        No intake or output data in the 24 hours ending 05/04/23 0953   PHYSICAL EXAM General: elderly female, in no acute distress sitting upright in hospital bed. HEENT: Normocephalic and atraumatic. Neck: No JVD.  Lungs: Normal respiratory effort on 2L [place]. Coarse breath sounds bilaterally.  Heart: HRRR. Normal S1 and S2 without gallops or murmurs. Radial & DP pulses 2+ bilaterally. Abdomen: Non-distended appearing.  Msk: Normal strength and tone for age. Extremities: No clubbing, cyanosis or edema.  Warm & well perfused.   LABS: Basic Metabolic Panel: Recent Labs    05/03/23 0401 05/04/23 0519  NA 141 141  K 4.7 3.6  CL 111 113*  CO2 23 23  GLUCOSE 96 81  BUN 24* 33*  CREATININE  0.47 0.35*  CALCIUM 8.5* 7.9*  MG 2.1 2.2  PHOS 2.6 2.7   Liver Function Tests: No results for input(s): "AST", "ALT", "ALKPHOS", "BILITOT", "PROT", "ALBUMIN" in the last 72 hours.  No results for input(s): "LIPASE", "AMYLASE" in the last 72 hours. CBC: Recent Labs    05/02/23 0519 05/03/23 0401 05/04/23 0519  WBC 11.8* 15.7* 8.8  NEUTROABS 10.3*  --   --   HGB 10.4* 11.1* 8.9*  HCT 30.8* 33.7* 26.8*  MCV 92.2 95.7 94.7  PLT 190 224 178   Cardiac Enzymes: No results for input(s): "CKTOTAL", "CKMB", "CKMBINDEX", "TROPONINIHS" in the last 72 hours. BNP: No results for input(s): "BNP" in the last 72 hours.  D-Dimer: No results for input(s): "DDIMER" in the last 72 hours. Hemoglobin A1C: No results for input(s): "HGBA1C" in the last 72 hours.  Fasting Lipid Panel: No results for input(s): "CHOL", "HDL", "LDLCALC", "TRIG", "CHOLHDL", "LDLDIRECT" in the last 72 hours. Thyroid Function Tests: No results for input(s): "TSH", "T4TOTAL", "T3FREE", "THYROIDAB" in the last 72 hours.  Invalid input(s): "FREET3" Anemia Panel: No results for input(s): "VITAMINB12", "FOLATE", "FERRITIN", "TIBC", "IRON", "RETICCTPCT" in the last 72 hours.   DG Chest Port 1 View  Result Date: 05/03/2023 CLINICAL DATA:  Cough EXAM: PORTABLE CHEST 1 VIEW COMPARISON:  04/28/2023 FINDINGS: Interval extubation. Pulmonary insufflation has decreased in the interval though remains within normal limits and is symmetric. Right internal jugular central venous  catheter has been removed. Bilateral perihilar and right upper lobe are asymmetric pulmonary infiltrates, likely infectious or inflammatory, appear progressive since prior examination. No pneumothorax or pleural effusion. Cardiac size within normal limits. No acute bone abnormality. Lumbar fusion hardware partially visualized. IMPRESSION: 1. Interval extubation. Slight interval decrease in pulmonary insufflation. 2. Progressive bilateral perihilar and right upper  lobe pulmonary infiltrates, likely infectious or inflammatory. Electronically Signed   By: Helyn Numbers M.D.   On: 05/03/2023 18:15    ECHO 04/22/2023: 1. Left ventricular ejection fraction, by estimation, is 30 to 35%. Left ventricular ejection fraction by 3D volume is 31 %. Left ventricular ejection fraction by PLAX is 34 %. The left ventricle has moderately decreased function. The left ventricle demonstrates global hypokinesis. Left ventricular diastolic parameters are consistent with Grade I diastolic dysfunction (impaired relaxation).   2. Right ventricular systolic function is mildly reduced. The right ventricular size is normal.   3. The mitral valve is normal in structure. Mild mitral valve regurgitation. No evidence of mitral stenosis.   4. The aortic valve has an indeterminant number of cusps. Aortic valve regurgitation is moderate. No aortic stenosis is present.   5. The inferior vena cava is normal in size with greater than 50% respiratory variability, suggesting right atrial pressure of 3 mmHg.   TELEMETRY reviewed by me 05/04/23: not on tele  EKG reviewed by me 05/04/23: NSR rate 92 bpm, T wave inversions in aVL, V2  DATA reviewed by me 05/04/23: last 24h vitals tele labs imaging I/O, hospitalist progress notes, respiratory notes, nursing notes  Principal Problem:   Severe sepsis (HCC) Active Problems:   Depression with anxiety   History of stroke   CAP (community acquired pneumonia)   Pneumonia due to COVID-19 virus   Rhabdomyolysis   AKI (acute kidney injury) (HCC)   Acute metabolic encephalopathy   Urinary tract infection   Anemia   Acute on chronic respiratory failure with hypoxia (HCC)   Sepsis with acute hypoxic respiratory failure without septic shock (HCC)   Acute deep vein thrombosis (DVT) of left peroneal vein (HCC)   Acute HFrEF (heart failure with reduced ejection fraction) (HCC)   Elevated troponin   Hypophosphatemia   Dementia (HCC)   Generalized  weakness   Dysphagia    ASSESSMENT AND PLAN: Teresa Sherman is a 77 y.o. female with a past medical history of CVA who presented to the ED on 04/11/2023 for SOB. She was seen by cardiology early in her admission for elevated troponin, NSTEMI in setting of covid infection. Cardiology reconsulted 8/30 for additional evaluation of severe LV dysfunction.  # Takotsubo cardiomyopathy vs covid-19 viral myocarditis # Acute heart failure reduced ejection fraction # NSTEMI Patient admitted with COVID-19 infection found to have NSTEMI on hospital day 5. Echo revealed severely reduced LV function. Was started on GDMT after BP was able to tolerate and doses titrated. Developed hypotension requiring pressor support on 9/4. Now weaned off of pressors and transferred out off ICU. Euvolemic on exam. Weight remains stable. -Continue farxiga. Lasix changed to prn given stable fluid status and poor PO intake. Continue to monitor fluid status closely. -Continue lisinopril, metoprolol.  -Plan to uptitrate doses as HR/BP and renal function allow.  # Acute metabolic encephalopathy Patient oriented x3 this AM. Slowing improving. -Management per primary  # Septic shock 2/2 ESBL UTI # Sinus tachycardia, resolved Patient with rates elevated 9/3, up to 150s. Rates improved with sedation while in the ICU.  -S/p gentle IV hydration -  S/p Meropenem and vancomycin  # Acute hypoxic respiratory failure # Covid-19 pneumonia Had increased oxygen requirement on BiPAP earlier in admission then weaned to room air by 9/3. Respiratory status declined overnight on 9/4 requiring intubation and mechanical ventilation. Extubated on 9/6. Now on room air. -Management per primary.   # Acute DVT # Anemia DVT found in LLE on [place] 04/22/2023.  -Eliquis switched to heparin on 9/4 then held due to anemia requiring blood transfusion overnight on 9/4. Hemoglobin uptrending & eliquis restarted 9/10. -Continue to monitor H&H  closely.  This patient's case was discussed and created with Dr. Juliann Pares and he is in agreement.  Signed:  Gale Journey, PA-C  05/04/2023, 9:53 AM [facility]

## 2023-05-04 NOTE — Progress Notes (Signed)
Progress Note   Patient: Teresa Sherman ZOX:[id number] DOB: [birth year] DOA: 04/11/2023     23 DOS: the patient was seen and examined on 05/04/2023    Subjective:  Pt seen awake resting in bed, daughter at bedside.  Pt does not seem to be hallucinating today, but remains confused.  She perseverates on going home, and that she "wants all her belongings back".  Seems concerned about the status of her home and property.  Pt has no complaints except wanting to go home.  Delirium seems slightly / slowly improved over past 2-3 days.  Brief hospital course: 77 y.o. female with medical history significant for Prior stroke, depression with anxiety, prior ESBL E. coli, admitted 04/11/2023 with Sepsis, Acute Metabolic Encephalopathy, and Acute Hypoxic Respiratory Failure in the setting of COVID-19 Pneumonia and suspected superimposed bacterial pneumonia.   She was found after wellness check by her niece when family was not able to reach patient for two days.  She was found somnolent and confused in her bed and had unintelligible mumbling but without focal neurologic deficits. She is reportedly oriented x 3 at baseline. She was also noted by EMS to have foul-smelling urine.   Initial Vitals: HR to 115, RR 23 with O2 sat 90 to 95% on room air, BP 101/83 improving to 126/87 with IV fluids  Significant Labs: WBC of 8.1 but lactic acid 2.4 and procalcitonin 7.73. Hemoglobin 7.8 with baseline around 13 a year ago. CMP notable for creatinine of 1.78, up from 0.51 a year ago, AST/ALT 45/23. COVID-positive. [id number].  Imaging Chest X-ray showed Right lower airspace opacity.    Pt was admitted to hospitalist service initially.   Prolonged and complicated hospital course as outlined below:   Significant Hospital Events 8/19: Admitted by [facility]. 8/21-8/27 (per [facility]): severe sepsis secondary to COVID19 pneumonia & possible co-bacterial infection. Completed IV rocephin, azithromycin x 5 days, later given Unasyn >>  Zosyn.  Pt was moved to stepdown placed on BiPAP >> heated HFNC.  Discussed code status w/ pt and she wants to continue full code.  8/23: Developed hypotension and increased fiO2 requirements, received IF fluids and albumin, moved to Stepdown.  Troponin elevated, placed on Heparin gtt, Cardiology consulted 8/24: Elevated Troponin felt to be due to demand ischemia, BP too soft for GDMT 8/25: moved back to Progressive Cardiac unit 8/26: Back to Stepdown due to AMS and removing supplemental O2 with desaturations.  8/29: Increasing FiO2 requirements (up to 90% FiO2 via HHFNC).  PCCM consulted with high risk for intubation. 8/30: Decreased HHFNC to 65%/50L. Remains delirious. Found to have LLE DVT. 8/31: did not tolerate BPAP overnight. Remains on significant HHFNC. 9/1: tolerated BPAP overnight. Down to 50%/45L. PCCM signed off to [facility] [place] 9/4: PCCM re-consulted for increased agitation, persistent tachycardia despite Dig, metoprolol and Amiodarone, also hypotensive  despite IVFs bolus now requiring vasopressor. SEVERE HYPOXIA REQUIRING INTUBATION 9/6: Extubated 9/7: TRH resumed care of patient. On 4 L/min [place] O2.  SLP evaluated, dysphagia-1 diet started.   9/8 - weaned to room air, placed back on 2 L/min O2 due to borderline O2 sats.  PT/OT evaluations. Pt hallucinating, delirious. 9/9-10 - very slowly improving delirium. Ongoing cough. On pureed diet (was regular before intubation).  Repeating CXR with rising WBC and persistent cough.         Assessment and Plan: * Severe sepsis (HCC) POA, resolved. Severe sepsis criteria include tachycardia and tachypnea with soft blood pressure, AKI, AMS, lactic acidosis with elevated procalcitonin Due to COVID  pneumonia with secondary bacterial infection, UTI --Completed antibiotics   Acute deep vein thrombosis (DVT) of left peroneal vein [facility]) Vascular was consulted for consideration of IVC filter. See consult note of 04/29/23.  Procedure was deferred due to goals  of care and code status made DNR. Continue Eliquis --If pt does not tolerate anticoagulation, reach back out to Vascular for IVC filter placement --Monitor anemia closely - improving --Monitor for any signs of bleeding   Acute on chronic respiratory failure with hypoxia (HCC) Possible aspiration pneumonia Resolved. Has ongoing wet-sounding cough and new dysphagia. Due to Covid Pneumonia, likely also bacterial.  Required intubation, extubated on 9/6.   Patient was seen by speech therapist Has completed antibiotic course Continue to monitor oxygen needs   Acute metabolic encephalopathy Underlying Dementia vs [facility] Delirium Initially related to sepsis which is now resolved.  Ongoing confusion, hallucinations consistent with delirium, not unusual given prolonged admission complicated by ICU/ intubation. At baseline pt is oriented x 3 per family with only mild cognitive impairment.  CT head negative. --Mgmt of underlying issues as outlined --Supportive care -- Continue delirium precautions Continue trazodone at bedtime Avoid benzodiazepines   Pneumonia due to COVID-19 virus Right lower lobe pneumonia Completed Rocephin and azithromycin --Off COVID precautions --Pulmonary hygiene --O2 per protocol --Symptomatic care as needed --Completed steroids 9/10   CAP (community acquired pneumonia) Patient has completed a course of antibiotics Suspect secondary bacterial pneumonia in setting of Covid-19   Hypophosphatemia Resolved with replacement 9/7, 9/8 Monitor & replace PRN   Acute HFrEF (heart failure with reduced ejection fraction) (HCC) Elevated Troponin / Demand Ischemia ?Covid-related myocarditis Echocardiogram 04/17/23: LVEF severely depressed, the proximal 1/4 of LV thickens but rest of ventricle is severely hypokinetic, concerning for Takotsubo's Cardiomyopathy. Akinesis/dyskinesis of distal 2/3 of RV with aneurysmal dilation in mid region, RV sytolic function moderately  reduced, moderate Aortic regurgitation -Maintain MAP >65 --BP's stable off pressors --hs-Troponin peaked at 3,734 Audiology on board and case discussed continue aspirin and statin therapy Continue Farxiga, metoprolol, lisinopril We will continue to titrate medication for GDMT as blood pressure allows I have discussed the case with cardiologist today Lasix have been switched to as needed by cardiology team as patient appears euvolemic     Anemia Improving. Uncertain acuity.  Hemoglobin 7.8 on admission, down from 13 a year ago. 9/7 - ICU RN reports pt has been having dark stools.  Anticoagulation was held and she was given 2 units pRBC's on 9/4 and 9/5.   --Monitor for bleeding signs --Daily CBC's Continue monitoring hemoglobin   AKI (acute kidney injury) (HCC) Resolved.  Creatinine 1.78 on admission.  up from baseline of 0.51, likely prerenal and related to sepsis. Continue to monitor renal function closely   History of stroke ASA was held and due to anemia requiring transfusion, resume. Continue statin therapy   Depression with anxiety Insomnia Continue home trazodone at bedtime PRN   Urinary tract infection History of Pseudomonas and ESBL E. coli UTI 11/2021 Urine reportedly foul-smelling with EMS.  Urine culture insignificant growth. Completed antibiotics for pneumonia as outlined.     Rhabdomyolysis CK near 1500 on admission, unknown whether traumatic or not.  Patient was found on the bed. Resolved with IV hydration.   Dysphagia Seems related to acute delirium. Pt was seen by SLP and on regular diet earlier this admission, pre-intubation.  Since extubation, pt's mental status remains altered and poor awareness in general. Ongoing wet sounding cough. --SLP following, appreciate recommendations --On pureed diet  with nectar-thickened liquids.  Diet advancement per SLP Continue assistance with feeding   Generalized weakness Due to prolonged & complicated  hospitalization. Continue PT OT   Dementia (HCC) Continue Aricept Continue delirium precaution  Avoid benzodiazepine    Sepsis with acute hypoxic respiratory failure without septic shock (HCC) POA, now resolved     Physical Exam: General exam: awake, alert, no acute distress, confused, frail and chronically ill appearing HEENT: moist mucus membranes, hearing grossly normal  Respiratory system: lungs clear but coarse upper airway secretion sounds, diminished bases due to poor inspiratory volumes, normal respiratory effort on room air Cardiovascular system: normal S1/S2, RRR, no pedal edema.   Gastrointestinal system: soft, NT, ND Central nervous system: A&O x self. no gross focal neurologic deficits, normal speech Extremities: moves all, no edema, normal tone Skin: dry, intact, normal temperature Psychiatry: normal mood, congruent affect, abnormal judgement and insight, visual hallucinations noted      Data Reviewed: I have reviewed patient's CBC, BMP as well as previous provider documentation, I have reviewed transition of care manager documentation and cardiologist documentation        Latest Ref Rng & Units 05/04/2023    5:19 AM 05/03/2023    4:01 AM 05/02/2023    5:19 AM  CBC  WBC 4.0 - 10.5 K/uL 8.8  15.7  11.8   Hemoglobin 12.0 - 15.0 g/dL 8.9  98.1  19.1   Hematocrit 36.0 - 46.0 % 26.8  33.7  30.8   Platelets 150 - 400 K/uL 178  224  190          Latest Ref Rng & Units 05/04/2023    5:19 AM 05/03/2023    4:01 AM 05/02/2023    5:19 AM  BMP  Glucose 70 - 99 mg/dL 81  96  86   BUN 8 - 23 mg/dL 33  24  18   Creatinine 0.44 - 1.00 mg/dL 4.78  2.95  <6.21   Sodium 135 - 145 mmol/L 141  141  138   Potassium 3.5 - 5.1 mmol/L 3.6  4.7  3.0   Chloride 98 - 111 mmol/L 113  111  108   CO2 22 - 32 mmol/L 23  23  20    Calcium 8.9 - 10.3 mg/dL 7.9  8.5  7.9     Micro:   Urine culture 9/4 - insignificant growth Blood cultures 9/5 - negative to date Sputum culture - normal  respiratory flora   C-diff negative 04/13/23 GI panel negative 04/13/23   Blood cultures 8/19 -- +Staph hominis likely contaminant Repeat blood cultures 8/21 - negative & final   Covid-19 POSITIVE on 04/11/23 -- now off precautions       Family Communication: Daughter Teresa Sherman updated at bedside (9/10)   Disposition: Status is: Inpatient Remains inpatient appropriate because: persistent encephalopathy and new dysphagia. Needs further improvement before stable for d/c.    Planned Discharge Destination: Skilled nursing facility    Vitals:   05/03/23 2013 05/04/23 0410 05/04/23 0716 05/04/23 0755  BP: 98/63 102/67  (!) 99/59  Pulse: 86 72  63  Resp: 18 20  20   Temp: 98.1 F (36.7 C) 98.8 F (37.1 C)  97.7 F (36.5 C)  TempSrc:    Oral  SpO2: (!) 89% 96%  96%  Weight:   52.6 kg   Height:         Author: Loyce Dys, MD 05/04/2023 3:27 PM  For on call review [email or web address].

## 2023-05-04 NOTE — TOC Progression Note (Signed)
Transition of Care [facility]) - Progression Note    Patient Details  Name: Teresa Sherman MRN: [id number] Date of Birth: 07/05/1946  Transition of [facility]) CM/SW Contact  Allena Katz, LCSW Phone Number: 05/04/2023, 4:44 PM  Clinical Narrative:   CSW spoke with patients daughter who wants home care or ALF at [facility] after [facility]. Daughter agreeable to [facility] with [facility] Daughter understands [facility] not [facility].          Expected Discharge Plan and Services                                               Social Determinants of Health (SDOH) Interventions SDOH Screenings   Food Insecurity: No Food Insecurity (04/13/2023)  Housing: Low Risk  (04/13/2023)  Transportation Needs: No Transportation Needs (04/13/2023)  Utilities: Not At Risk (04/13/2023)  Financial Resource Strain: Low Risk  (06/14/2021)   Received from [facility]  Physical Activity: Sufficiently Active (10/30/2021)   Received from [facility]  Social Connections: Socially Isolated (10/30/2021)   Received from [facility]  Stress: No Stress Concern Present (10/30/2021)   Received from [facility]  Tobacco Use: Low Risk  (04/11/2023)  Health Literacy: Medium Risk (10/30/2021)   Received from [facility]    Readmission Risk Interventions     No data to display

## 2023-05-04 NOTE — Progress Notes (Signed)
PHARMACY CONSULT NOTE - ELECTROLYTES  Pharmacy Consult for Electrolyte Monitoring and Replacement   Recent Labs: Height: 5' (152.4 cm) Weight: 53.2 kg (117 lb 4.6 oz) IBW/kg (Calculated) : 45.5 Estimated Creatinine Clearance: 43.6 mL/min (A) (by C-G formula based on SCr of 0.35 mg/dL (L)). Potassium (mmol/L)  Date Value  05/04/2023 3.6  11/26/2013 3.8   Magnesium (mg/dL)  Date Value  95/62/1308 2.2   Calcium (mg/dL)  Date Value  65/78/4696 7.9 (L)   Calcium, Total (mg/dL)  Date Value  29/52/8413 9.5   Albumin (g/dL)  Date Value  24/40/1027 1.9 (L)  04/30/2015 4.3  11/26/2013 3.9   Phosphorus (mg/dL)  Date Value  25/36/6440 2.7   Sodium (mmol/L)  Date Value  05/04/2023 141  04/30/2015 140  11/26/2013 142   Corrected Calcium: 9.58 mg/dL  Assessment  Teresa Sherman is a 77 y.o. female presenting with Severe sepsis secondary to pneumonia. PMH significant for CVA, Anemia, and depression. Pharmacy has been consulted to monitor and replace electrolytes.  Diet/Nutrition: Dysphagia 1 diet ordered, Juven q packet BID.   Goal of Therapy: Electrolytes WNL  Plan:  No replacement currently indicated. F/u tomorrow with AM labs.  Thank you for involving pharmacy in this patient's care.   Bettey Costa, PharmD Clinical Pharmacist 05/04/2023 8:09 AM

## 2023-05-04 NOTE — Progress Notes (Signed)
Occupational Therapy Treatment Patient Details Name: Teresa Sherman MRN: [id number] DOB: 06-17-1946 Today's Date: 05/04/2023   History of present illness Pt is a 77 y/o F admitted on 04/11/23 for sepsis, acute metabolic encephalopathy, acute hypoxic respiratory failure in the setting of COVID-19 PNA & suspected superimposed bacterial PNA. Pt found to have LLE DVT on 04/22/23, intubated on 04/27/23, extubated 04/29/23. PMH: stroke, depression, anxiety   OT comments  Ms Gladys was seen for OT treatment on this date. Upon arrival to room pt seated in chair, agreeable to tx. Pt requires SETUP + MIN cues self-feeding in sitting, cues for aspiration precautions. MIN A x2 + RW for functional mobility ~15 ft, assist to manage RW. Significantly improved cognition this date however not oriented to situation. Pt making good progress toward goals, will continue to follow POC. Discharge recommendation remains appropriate.        If plan is discharge home, recommend the following:  A lot of help with walking and/or transfers;A lot of help with bathing/dressing/bathroom;Supervision due to cognitive status;Help with stairs or ramp for entrance;Assistance with feeding;Direct supervision/assist for medications management;Assistance with cooking/housework;Assist for transportation   Equipment Recommendations  Other (comment) (defer)    Recommendations for Other Services      Precautions / Restrictions Precautions Precautions: Fall Restrictions Weight Bearing Restrictions: No       Mobility Bed Mobility               General bed mobility comments: not tested    Transfers Overall transfer level: Needs assistance Equipment used: Rolling walker (2 wheels) Transfers: Sit to/from Stand Sit to Stand: Min assist, +2 safety/equipment                 Balance Overall balance assessment: Needs assistance Sitting-balance support: Feet supported, Bilateral upper extremity supported Sitting  balance-Leahy Scale: Good     Standing balance support: Bilateral upper extremity supported, Reliant on assistive device for balance Standing balance-Leahy Scale: Fair                             ADL either performed or assessed with clinical judgement   ADL Overall ADL's : Needs assistance/impaired                                       General ADL Comments: SETUP + MIN cues self-feeding in sitting, cues for aspiration precautions. MIN A x2 + RW for BSC t/f      Cognition Arousal: Alert Behavior During Therapy: WFL for tasks assessed/performed Overall Cognitive Status: Impaired/Different from baseline Area of Impairment: Following commands, Safety/judgement                       Following Commands: Follows one step commands consistently Safety/Judgement: Decreased awareness of safety, Decreased awareness of deficits     General Comments: orientation log: 20/30 - orietnted to location and time with cues, not oriented to situation. imporved command following                   Pertinent Vitals/ Pain       Pain Assessment Pain Assessment: Faces Faces Pain Scale: Hurts a little bit Pain Location: bottom Pain Descriptors / Indicators: Aching, Dull Pain Intervention(s): Limited activity within patient's tolerance, Repositioned   Frequency  Min 1X/week        Progress  Toward Goals  OT Goals(current goals can now be found in the care plan section)  Progress towards OT goals: Progressing toward goals  Acute Rehab OT Goals Patient Stated Goal: to get warm OT Goal Formulation: With patient Time For Goal Achievement: 05/16/23 Potential to Achieve Goals: Fair ADL Goals Pt Will Perform Grooming: with modified independence;standing Pt Will Perform Lower Body Dressing: with modified independence;sit to/from stand Pt Will Transfer to Toilet: with modified independence;ambulating;regular height toilet   AM-PAC OT "6 Clicks" Daily  Activity     Outcome Measure   Help from another person eating meals?: A Little Help from another person taking care of personal grooming?: A Little Help from another person toileting, which includes using toliet, bedpan, or urinal?: A Lot Help from another person bathing (including washing, rinsing, drying)?: A Lot Help from another person to put on and taking off regular upper body clothing?: A Little Help from another person to put on and taking off regular lower body clothing?: A Lot 6 Click Score: 15    End of Session    OT Visit Diagnosis: Unsteadiness on feet (R26.81);Muscle weakness (generalized) (M62.81)   Activity Tolerance Patient tolerated treatment well   Patient Left in chair;with call bell/phone within reach;with chair alarm set   Nurse Communication          Time: 1478-2956 OT Time Calculation (min): 28 min  Charges: OT General Charges $OT Visit: 1 Visit OT Treatments $Self Care/Home Management : 23-37 mins  Kathie Dike, M.S. OTR/L  05/04/23, 12:12 PM  ascom [phone]

## 2023-05-04 NOTE — Progress Notes (Signed)
Speech Language Pathology Treatment: Dysphagia  Patient Details Name: Teresa Sherman MRN: [id number] DOB: December 18, 1945 Today's Date: 05/04/2023 Time: 1400-1430 SLP Time Calculation (min) (ACUTE ONLY): 30 min  Assessment / Plan / Recommendation Clinical Impression  Pt seen today for ongoing assessment of swallowing; toleration of oral diet. Pt is exhibiting less Hallucinations today; min improvement w/ residual Confusion still. Less wet vocal quality BEFORE/DURING/AFTER po intake. She is Distracted but not impulsive during session; she required MOD+ verbal/visual cues. Dtr present and stated pt was "much better today". On RA, afebrile. WBC WNL.   During po trials, pt consumed puree trials feeding self sitting in chair; Nectar consistency liquids via cup. Similar dry, then wet vocal quality noted intermittently during po trials but much less than at previous session. No overall decline in pt's ANS presentation now respiratory status. She exhibited no decline in respiratory function; no continuous coughing prominent. Oral phase Slow; slow bolus management and oral clearing. Monitored for pt to clear mouth w/ Dry swallow even b/f taking another bite/sip -- too much residue/bolus if not fully clearing. MD note: "Severe sepsis criteria include tachycardia and tachypnea with soft blood pressure, AKI, AMS, lactic acidosis with elevated procalcitonin due to COVID pneumonia with secondary bacterial infection, UTI".      In setting of pt's overall presentation and Slow motor movements including Slow bolus manipulation (of Puree foods, Nectar liquids), recommend continue a Dysphagia level 1 w/ Nectar liquids w/ strict aspiration precautions. Pt continues to be tx'd for ongoing medical issues which may be exacerbating her Cognitive awareness during swallowing.  ST services will f/u w/ toleration of diet, monitor her overall status, and education on precautions; monitor for need for objective swallow  assessment next 1-3 days. Dtr agreed. MD agreed. NSG updated on strict aspiration precautions and Supervision w/ all oral intake.      HPI HPI: Pt is a 77 y.o. female with medical history significant for prior stroke, depression with anxiety, prior ESBL E. coli, who presents to the emergency room by EMS with altered mental status.  She was found after wellness check was requested after she had not been seen in 2 days.  She was found somnolent and confused in her bed and had unintelligible mumbling but without focal neurologic deficits.  She is reportedly oriented x 3 at baseline.  She was also noted by EMS to have foul-smelling urine ED course and data review:  COVID-positive.   Per her history, she was previously diagnosed with Mild Cognitive Impairment(MCI) but is able to speak in function with minimal issues.  She does occasionally display emotional lability and say inappropriate things at times however these have been ongoing issues.   CXR this admit: There has developed extensive airspace infiltrate throughout the  right lung in keeping with progressive pneumonic infiltrate. Left  lung is clear. No pneumothorax or pleural effusion. Cardiac size  within normal limits. Pulmonary vascularity is normal.      SLP Plan  Continue with current plan of care      Recommendations for follow up therapy are one component of a multi-disciplinary discharge planning process, led by the attending physician.  Recommendations may be updated based on patient status, additional functional criteria and insurance authorization.    Recommendations  Diet recommendations: Dysphagia 1 (puree);Nectar-thick liquid Liquids provided via: Cup Medication Administration: Crushed with puree Supervision: Full supervision/cueing for compensatory strategies Compensations: Minimize environmental distractions;Slow rate;Small sips/bites;Lingual sweep for clearance of pocketing;Multiple dry swallows after each bite/sip;Follow solids  with liquid Postural  Changes and/or Swallow Maneuvers: Out of bed for meals;Seated upright 90 degrees;Upright 30-60 min after meal (REFLUX precs.)                 (Dietician f/u; GI f/u?) Oral care BID;Oral care before and after PO;Staff/trained caregiver to provide oral care   Frequent or constant Supervision/Assistance Dysphagia, pharyngeal phase (R13.13);Dysphagia, pharyngoesophageal phase (R13.14)     Continue with current plan of care       Teresa Som, MS, CCC-SLP Speech Language Pathologist Rehab Services; [facility] [phone] (ascom) Teresa Sherman  05/04/2023, 5:35 PM

## 2023-05-04 NOTE — Care Management Important Message (Signed)
Important Message  Patient Details  Name: Teresa Sherman MRN: [id number] Date of Birth: Mar 08, 1946   Medicare Important Message Given:  Yes  I reviewed the Important Message from Medicare with one of the patient's HCPOA's, Teresa Sherman, daughter by phone [phone] and she stated she understood her rights. She lives in [place] and asks to be notified of when she will transfer to SNF and let her know I would put a note in the [facility] handoff column so they would know to contact her and thanked her for her time.    Olegario Messier A Joban Colledge 05/04/2023, 2:24 PM

## 2023-05-05 DIAGNOSIS — A419 Sepsis, unspecified organism: Secondary | ICD-10-CM | POA: Diagnosis not present

## 2023-05-05 DIAGNOSIS — R6520 Severe sepsis without septic shock: Secondary | ICD-10-CM | POA: Diagnosis not present

## 2023-05-05 LAB — BASIC METABOLIC PANEL
Anion gap: 8 (ref 5–15)
Anion gap: 10 (ref 5–15)
BUN: 27 mg/dL — ABNORMAL HIGH (ref 8–23)
BUN: 28 mg/dL — ABNORMAL HIGH (ref 8–23)
CO2: 24 mmol/L (ref 22–32)
CO2: 23 mmol/L (ref 22–32)
Calcium: 7.7 mg/dL — ABNORMAL LOW (ref 8.9–10.3)
Calcium: 7.8 mg/dL — ABNORMAL LOW (ref 8.9–10.3)
Chloride: 103 mmol/L (ref 98–111)
Chloride: 103 mmol/L (ref 98–111)
Creatinine, Ser: 0.38 mg/dL — ABNORMAL LOW (ref 0.44–1.00)
Creatinine, Ser: 0.38 mg/dL — ABNORMAL LOW (ref 0.44–1.00)
GFR, Estimated: >60 mL/min (ref >60)
GFR, Estimated: >60 mL/min (ref >60)
Glucose, Bld: 104 mg/dL — ABNORMAL HIGH (ref 70–99)
Glucose, Bld: 139 mg/dL — ABNORMAL HIGH (ref 70–99)
Potassium: 2.9 mmol/L — ABNORMAL LOW (ref 3.5–5.1)
Potassium: 3.3 mmol/L — ABNORMAL LOW (ref 3.5–5.1)
Sodium: 135 mmol/L (ref 135–145)
Sodium: 136 mmol/L (ref 135–145)

## 2023-05-05 LAB — BLOOD GAS, VENOUS
Acid-Base Excess: 4.5 mmol/L — ABNORMAL HIGH (ref 0.0–2.0)
Bicarbonate: 26.5 mmol/L (ref 20.0–28.0)
O2 Saturation: 90.7 %
Patient temperature: 37
pCO2, Ven: 31 mmHg — ABNORMAL LOW (ref 44–60)
pH, Ven: 7.54 — ABNORMAL HIGH (ref 7.25–7.43)
pO2, Ven: 54 mmHg — ABNORMAL HIGH (ref 32–45)

## 2023-05-05 LAB — TROPONIN I (HIGH SENSITIVITY)
Troponin I (High Sensitivity): 18 ng/L — ABNORMAL HIGH (ref <18)
Troponin I (High Sensitivity): 17 ng/L (ref <18)

## 2023-05-05 LAB — CBC
HCT: 26.5 % — ABNORMAL LOW (ref 36.0–46.0)
Hemoglobin: 8.9 g/dL — ABNORMAL LOW (ref 12.0–15.0)
MCH: 31.9 pg (ref 26.0–34.0)
MCHC: 33.6 g/dL (ref 30.0–36.0)
MCV: 95 fL (ref 80.0–100.0)
Platelets: 191 10*3/uL (ref 150–400)
RBC: 2.79 MIL/uL — ABNORMAL LOW (ref 3.87–5.11)
RDW: 15.5 % (ref 11.5–15.5)
WBC: 9.6 10*3/uL (ref 4.0–10.5)
nRBC: 0 % (ref 0.0–0.2)

## 2023-05-05 LAB — LACTIC ACID, PLASMA
Lactic Acid, Venous: 1 mmol/L (ref 0.5–1.9)
Lactic Acid, Venous: 0.7 mmol/L (ref 0.5–1.9)

## 2023-05-05 LAB — GLUCOSE, CAPILLARY
Glucose-Capillary: 108 mg/dL — ABNORMAL HIGH (ref 70–99)
Glucose-Capillary: 122 mg/dL — ABNORMAL HIGH (ref 70–99)
Glucose-Capillary: 142 mg/dL — ABNORMAL HIGH (ref 70–99)
Glucose-Capillary: 149 mg/dL — ABNORMAL HIGH (ref 70–99)
Glucose-Capillary: 88 mg/dL (ref 70–99)
Glucose-Capillary: 96 mg/dL (ref 70–99)

## 2023-05-05 LAB — PROCALCITONIN: Procalcitonin: <0.1 ng/mL

## 2023-05-05 LAB — BRAIN NATRIURETIC PEPTIDE: B Natriuretic Peptide: 70.7 pg/mL (ref 0.0–100.0)

## 2023-05-05 LAB — MAGNESIUM: Magnesium: 1.8 mg/dL (ref 1.7–2.4)

## 2023-05-05 MED ORDER — DAPAGLIFLOZIN PROPANEDIOL 10 MG PO TABS
10.0000 mg | ORAL_TABLET | Freq: Every day | ORAL | Status: DC
  Administered 2023-05-06: 10 mg via ORAL
  Filled 2023-05-05: qty 1

## 2023-05-05 MED ORDER — POTASSIUM CHLORIDE 20 MEQ PO PACK: 20.0000 meq | PACK | Freq: Two times a day (BID) | ORAL | Status: DC

## 2023-05-05 MED ORDER — POTASSIUM CHLORIDE CRYS ER 20 MEQ PO TBCR
20.0000 meq | EXTENDED_RELEASE_TABLET | Freq: Two times a day (BID) | ORAL | Status: DC
  Administered 2023-05-05 – 2023-05-06 (×2): 20 meq via ORAL
  Filled 2023-05-05 (×3): qty 1

## 2023-05-05 MED ORDER — POTASSIUM CHLORIDE 10 MEQ/100ML IV SOLN
10.0000 meq | Freq: Once | INTRAVENOUS | Status: AC
  Administered 2023-05-05: 10 meq via INTRAVENOUS
  Filled 2023-05-05: qty 100

## 2023-05-05 NOTE — Progress Notes (Signed)
[facility] CLINIC CARDIOLOGY PROGRESS NOTE   Patient ID: Teresa Sherman MRN: [id number] DOB/AGE: 03/27/1946 77 y.o.  Admit date: 04/11/2023 Referring Physician Dr. Joylene Igo Primary Physician Dr [name]  Primary Cardiologist none Reason for Consultation LV dysfunction  HPI: Teresa Sherman is a 77 y.o. female with a past medical history of CVA who presented to the ED on 04/11/2023 for SOB. She was seen by cardiology early in her admission for elevated troponin, NSTEMI in setting of covid infection. Cardiology reconsulted 8/30 for additional evaluation of severe LV dysfunction.  Interval History:  -Patient feeling ok this AM, daughter present today.  -Mental status much improved. Out of bed today for the first time. -Denies SOB, CP, palpitations. Remains without LE edema.   Review of systems complete and found to be negative unless listed above    Vitals:   05/04/23 2117 05/05/23 0400 05/05/23 0500 05/05/23 0815  BP: 103/62 (!) 85/53  (!) 91/58  Pulse: (!) 102 78  73  Resp: 20 18  18   Temp: 98.4 F (36.9 C) 98.6 F (37 C)  98.8 F (37.1 C)  TempSrc:      SpO2: (!) 88% 93%  93%  Weight:   51.9 kg   Height:        No intake or output data in the 24 hours ending 05/05/23 1423   PHYSICAL EXAM General: elderly female, in no acute distress sitting upright in bedside chair with daughter present at bedside. HEENT: Normocephalic and atraumatic. Neck: No JVD.  Lungs: Normal respiratory effort on 2L [place]. Coarse breath sounds bilaterally.  Heart: HRRR. Normal S1 and S2 without gallops or murmurs. Radial & DP pulses 2+ bilaterally. Abdomen: Non-distended appearing.  Msk: Normal strength and tone for age. Extremities: No clubbing, cyanosis or edema.  Warm & well perfused.   LABS: Basic Metabolic Panel: Recent Labs    05/03/23 0401 05/04/23 0519 05/05/23 0318  NA 141 141 135  K 4.7 3.6 2.9*  CL 111 113* 103  CO2 23 23 24   GLUCOSE 96 81 104*  BUN 24* 33* 27*   CREATININE 0.47 0.35* 0.38*  CALCIUM 8.5* 7.9* 7.7*  MG 2.1 2.2 1.8  PHOS 2.6 2.7  --    Liver Function Tests: No results for input(s): "AST", "ALT", "ALKPHOS", "BILITOT", "PROT", "ALBUMIN" in the last 72 hours.  No results for input(s): "LIPASE", "AMYLASE" in the last 72 hours. CBC: Recent Labs    05/04/23 0519 05/05/23 0318  WBC 8.8 9.6  HGB 8.9* 8.9*  HCT 26.8* 26.5*  MCV 94.7 95.0  PLT 178 191   Cardiac Enzymes: Recent Labs    05/05/23 0318 05/05/23 0516  TROPONINIHS 18* 17   BNP: Recent Labs    05/05/23 0318  BNP 70.7    D-Dimer: No results for input(s): "DDIMER" in the last 72 hours. Hemoglobin A1C: No results for input(s): "HGBA1C" in the last 72 hours.  Fasting Lipid Panel: No results for input(s): "CHOL", "HDL", "LDLCALC", "TRIG", "CHOLHDL", "LDLDIRECT" in the last 72 hours. Thyroid Function Tests: No results for input(s): "TSH", "T4TOTAL", "T3FREE", "THYROIDAB" in the last 72 hours.  Invalid input(s): "FREET3" Anemia Panel: No results for input(s): "VITAMINB12", "FOLATE", "FERRITIN", "TIBC", "IRON", "RETICCTPCT" in the last 72 hours.   No results found.  ECHO 04/22/2023: 1. Left ventricular ejection fraction, by estimation, is 30 to 35%. Left ventricular ejection fraction by 3D volume is 31 %. Left ventricular ejection fraction by PLAX is 34 %. The left ventricle has moderately decreased function. The  left ventricle demonstrates global hypokinesis. Left ventricular diastolic parameters are consistent with Grade I diastolic dysfunction (impaired relaxation).   2. Right ventricular systolic function is mildly reduced. The right ventricular size is normal.   3. The mitral valve is normal in structure. Mild mitral valve regurgitation. No evidence of mitral stenosis.   4. The aortic valve has an indeterminant number of cusps. Aortic valve regurgitation is moderate. No aortic stenosis is present.   5. The inferior vena cava is normal in size with greater  than 50% respiratory variability, suggesting right atrial pressure of 3 mmHg.   TELEMETRY reviewed by me 05/05/23: not on tele  EKG reviewed by me 05/05/23: NSR rate 92 bpm, T wave inversions in aVL, V2  DATA reviewed by me 05/05/23: last 24h vitals tele labs imaging I/O, hospitalist progress notes, respiratory notes, nursing notes  Principal Problem:   Severe sepsis (HCC) Active Problems:   Depression with anxiety   History of stroke   CAP (community acquired pneumonia)   Pneumonia due to COVID-19 virus   Rhabdomyolysis   AKI (acute kidney injury) (HCC)   Acute metabolic encephalopathy   Urinary tract infection   Anemia   Acute on chronic respiratory failure with hypoxia (HCC)   Sepsis with acute hypoxic respiratory failure without septic shock (HCC)   Acute deep vein thrombosis (DVT) of left peroneal vein (HCC)   Acute HFrEF (heart failure with reduced ejection fraction) (HCC)   Elevated troponin   Hypophosphatemia   Dementia (HCC)   Generalized weakness   Dysphagia    ASSESSMENT AND PLAN: Teresa Sherman is a 77 y.o. female with a past medical history of CVA who presented to the ED on 04/11/2023 for SOB. She was seen by cardiology early in her admission for elevated troponin, NSTEMI in setting of covid infection. Cardiology reconsulted 8/30 for additional evaluation of severe LV dysfunction.  # Takotsubo cardiomyopathy vs covid-19 viral myocarditis # Acute heart failure reduced ejection fraction # NSTEMI Patient admitted with COVID-19 infection found to have NSTEMI on hospital day 5. Echo revealed severely reduced LV function. Was started on GDMT after BP was able to tolerate and doses titrated. Developed hypotension requiring pressor support on 9/4. Now weaned off of pressors and transferred out off ICU. Euvolemic on exam. Weight remains stable. -Continue farxiga. Lasix changed to prn given stable fluid status. Continue to monitor fluid status closely. -Continue  lisinopril, metoprolol. Hold parameters in place.  -Goal is to uptitrate doses as HR/BP and renal function allow. Daughter reports her BP usually runs low.  # Hypokalemia K noted to 2.9, supplementing today.  -Monitor and replenish electrolytes for a goal K >4, Mag >2   # Acute metabolic encephalopathy Patient oriented x3 this AM. Slowing improving. -Management per primary  # Septic shock 2/2 ESBL UTI # Sinus tachycardia, resolved Patient with rates elevated 9/3, up to 150s. Rates improved with sedation while in the ICU.  -S/p gentle IV hydration -S/p Meropenem and vancomycin  # Acute hypoxic respiratory failure # Covid-19 pneumonia Had increased oxygen requirement on BiPAP earlier in admission then weaned to room air by 9/3. Respiratory status declined overnight on 9/4 requiring intubation and mechanical ventilation. Extubated on 9/6. Now on room air. -Management per primary.   # Acute DVT # Anemia DVT found in LLE on [place] 04/22/2023.  -Eliquis switched to heparin on 9/4 then held due to anemia requiring blood transfusion overnight on 9/4. Hemoglobin uptrending & eliquis restarted 9/10. -Continue to monitor H&H closely.  This patient's case was discussed and created with Dr. Darrold Junker and he is in agreement.  Signed:  Gale Journey, PA-C  05/05/2023, 2:23 PM [facility] Cardiology

## 2023-05-05 NOTE — Progress Notes (Signed)
Physical Therapy Treatment Patient Details Name: Teresa Sherman MRN: [id number] DOB: 06-Mar-1946 Today's Date: 05/05/2023   History of Present Illness Pt is a 77 y/o F admitted on 04/11/23 for sepsis, acute metabolic encephalopathy, acute hypoxic respiratory failure in the setting of COVID-19 PNA & suspected superimposed bacterial PNA. Pt found to have LLE DVT on 04/22/23, intubated on 04/27/23, extubated 04/29/23. PMH: stroke, depression, anxiety    PT Comments  Pt received in bed agreeable to PT and requesting BSC, daughter present. Pt required ModA for bed mobility and transfers sue to generalized weakness and fatigue. 90-91% on 2.5L throughout session. Slight SOB and +productive cough noted. Pt able to advance a few steps with RW and ModA for safety due to B LE weakness and high risk for buckling. Pt educated on importance of upright posture, pulmonary toileting, and PLB technique. Overall good tolerance for activity, pt remains very motivated to return to PLOF. Cognition appears to be improving as well. Pt awaiting short term SNF prior to returning home.   If plan is discharge home, recommend the following: Assistance with cooking/housework;Direct supervision/assist for medications management;Direct supervision/assist for financial management;Assist for transportation;Help with stairs or ramp for entrance;Supervision due to cognitive status;Two people to help with walking and/or transfers;Two people to help with bathing/dressing/bathroom   Can travel by private vehicle     No  Equipment Recommendations  Other (comment) (TBD at next venue of care)    Recommendations for Other Services       Precautions / Restrictions Precautions Precautions: Fall Restrictions Weight Bearing Restrictions: No     Mobility  Bed Mobility Overal bed mobility: Needs Assistance Bed Mobility: Supine to Sit     Supine to sit: Mod assist, HOB elevated     General bed mobility comments: Increased time  to complete bed mobility    Transfers Overall transfer level: Needs assistance Equipment used: Rolling walker (2 wheels) Transfers: Sit to/from Stand, Bed to chair/wheelchair/BSC Sit to Stand: Mod assist   Step pivot transfers: Mod assist       General transfer comment:  (ModA to maintain safety incase of LOB from generalized weakness. Assist to maneuver RW)    Ambulation/Gait Ambulation/Gait assistance: Mod assist Gait Distance (Feet): 2 Feet Assistive device: Rolling walker (2 wheels) Gait Pattern/deviations: Shuffle Gait velocity: decreased     General Gait Details: ModA to advance a few steps from bed to chair, no "true" continuous gait tolerated. Pt very fatigued   Stairs             Wheelchair Mobility     Tilt Bed    Modified Rankin (Stroke Patients Only)       Balance Overall balance assessment: Needs assistance Sitting-balance support: Feet supported, Bilateral upper extremity supported Sitting balance-Leahy Scale: Fair Sitting balance - Comments: CGA at times to prevent posterior LOB in sitting due to fatigue Postural control: Posterior lean Standing balance support: Bilateral upper extremity supported, Reliant on assistive device for balance Standing balance-Leahy Scale: Poor Standing balance comment: high fall risk due to generalized weakness and fatigue                            Cognition Arousal: Alert Behavior During Therapy: WFL for tasks assessed/performed Overall Cognitive Status: Impaired/Different from baseline Area of Impairment: Following commands, Safety/judgement                 Orientation Level: Disoriented to, Time, Situation     Following  Commands: Follows one step commands consistently Safety/Judgement: Decreased awareness of safety, Decreased awareness of deficits Awareness: Intellectual Problem Solving: Requires verbal cues, Requires tactile cues General Comments:  (Daughter present)         Exercises General Exercises - Lower Extremity Ankle Circles/Pumps: AROM, AAROM, Both, 10 reps, Supine Long Arc Quad: AROM, Both, 10 reps, Seated Hip Flexion/Marching: AROM, Both, 5 reps, Supine Other Exercises Other Exercises: Pt and daughter educated on role of PT, importance of OOB activity, pulmonary toileting, and PLB technique, re-education beneficial    General Comments General comments (skin integrity, edema, etc.): Pt on 2.5L O2 this date with SpO2 at 90-91% throughout session. Increased SOB and fatigue with minimal activity.      Pertinent Vitals/Pain Pain Assessment Pain Assessment: No/denies pain    Home Living                          Prior Function            PT Goals (current goals can now be found in the care plan section) Acute Rehab PT Goals Patient Stated Goal: Get back home Progress towards PT goals: Progressing toward goals    Frequency    Min 1X/week      PT Plan      Co-evaluation              AM-PAC PT "6 Clicks" Mobility   Outcome Measure  Help needed turning from your back to your side while in a flat bed without using bedrails?: A Lot Help needed moving from lying on your back to sitting on the side of a flat bed without using bedrails?: A Lot Help needed moving to and from a bed to a chair (including a wheelchair)?: A Lot Help needed standing up from a chair using your arms (e.g., wheelchair or bedside chair)?: A Lot Help needed to walk in hospital room?: A Lot Help needed climbing 3-5 steps with a railing? : Total 6 Click Score: 11    End of Session Equipment Utilized During Treatment: Gait belt Activity Tolerance: Patient limited by fatigue Patient left: in chair;with call bell/phone within reach;with chair alarm set;with family/visitor present Nurse Communication: Mobility status;Other (comment) (+BM on BSC, SpO2 %) PT Visit Diagnosis: Unsteadiness on feet (R26.81);Muscle weakness (generalized) (M62.81);Other  abnormalities of gait and mobility (R26.89);Difficulty in walking, not elsewhere classified (R26.2)     Time: 3295-1884 PT Time Calculation (min) (ACUTE ONLY): 40 min  Charges:    $Gait Training: 8-22 mins $Therapeutic Exercise: 8-22 mins $Therapeutic Activity: 8-22 mins PT General Charges $$ ACUTE PT VISIT: 1 Visit                    Zadie Cleverly, PTA  Jannet Askew 05/05/2023, 2:06 PM

## 2023-05-05 NOTE — Progress Notes (Addendum)
Progress Note   Patient: Teresa Sherman WUJ:[id number] DOB: 1946-08-21 DOA: 04/11/2023     24 DOS: the patient was seen and examined on 05/05/2023    Subjective:  Patient seen and examined at bedside this morning Denies nausea vomiting Appears lethargic   Brief hospital course: 77 y.o. female with medical history significant for Prior stroke, depression with anxiety, prior ESBL E. coli, admitted 04/11/2023 with Sepsis, Acute Metabolic Encephalopathy, and Acute Hypoxic Respiratory Failure in the setting of COVID-19 Pneumonia and suspected superimposed bacterial pneumonia.   She was found after wellness check by her niece when family was not able to reach patient for two days.  She was found somnolent and confused in her bed and had unintelligible mumbling but without focal neurologic deficits. She is reportedly oriented x 3 at baseline. She was also noted by EMS to have foul-smelling urine.   Initial Vitals: HR to 115, RR 23 with O2 sat 90 to 95% on room air, BP 101/83 improving to 126/87 with IV fluids  Significant Labs: WBC of 8.1 but lactic acid 2.4 and procalcitonin 7.73. Hemoglobin 7.8 with baseline around 13 a year ago. CMP notable for creatinine of 1.78, up from 0.51 a year ago, AST/ALT 45/23. COVID-positive. [id number].  Imaging Chest X-ray showed Right lower airspace opacity.    Pt was admitted to hospitalist service initially.   Prolonged and complicated hospital course as outlined below:   Significant Hospital Events 8/19: Admitted by [facility]. 8/21-8/27 (per [facility]): severe sepsis secondary to COVID19 pneumonia & possible co-bacterial infection. Completed IV rocephin, azithromycin x 5 days, later given Unasyn >> Zosyn.  Pt was moved to stepdown placed on BiPAP >> heated HFNC.  Discussed code status w/ pt and she wants to continue full code.  8/23: Developed hypotension and increased fiO2 requirements, received IF fluids and albumin, moved to Stepdown.  Troponin elevated, placed  on Heparin gtt, Cardiology consulted 8/24: Elevated Troponin felt to be due to demand ischemia, BP too soft for GDMT 8/25: moved back to Progressive Cardiac unit 8/26: Back to Stepdown due to AMS and removing supplemental O2 with desaturations.  8/29: Increasing FiO2 requirements (up to 90% FiO2 via HHFNC).  PCCM consulted with high risk for intubation. 8/30: Decreased HHFNC to 65%/50L. Remains delirious. Found to have LLE DVT. 8/31: did not tolerate BPAP overnight. Remains on significant HHFNC. 9/1: tolerated BPAP overnight. Down to 50%/45L. PCCM signed off to [facility] 9/4: PCCM re-consulted for increased agitation, persistent tachycardia despite Dig, metoprolol and Amiodarone, also hypotensive  despite IVFs bolus now requiring vasopressor. SEVERE HYPOXIA REQUIRING INTUBATION 9/6: Extubated 9/7: [facility] resumed care of patient. On 4 L/min [place] O2.  SLP evaluated, dysphagia-1 diet started.   9/8 - weaned to room air, placed back on 2 L/min O2 due to borderline O2 sats.  PT/OT evaluations. Pt hallucinating, delirious. 9/9-10 - very slowly improving delirium. Ongoing cough. On pureed diet (was regular before intubation).  Repeating CXR with rising WBC and persistent cough.         Assessment and Plan: * Severe sepsis (HCC) POA, resolved. Severe sepsis criteria include tachycardia and tachypnea with soft blood pressure, AKI, AMS, lactic acidosis with elevated procalcitonin Due to COVID pneumonia with secondary bacterial infection, UTI --Completed antibiotics   Acute deep vein thrombosis (DVT) of left peroneal vein [facility]) Vascular was consulted for consideration of IVC filter. See consult note of 04/29/23.  Procedure was deferred due to goals of care and code status made DNR. Continue Eliquis --If pt does not  tolerate anticoagulation, reach back out to Vascular for IVC filter placement --Monitor anemia closely - improving --Monitor for any signs of bleeding   Acute on chronic respiratory failure with  hypoxia (HCC) Possible aspiration pneumonia Resolved. Has ongoing wet-sounding cough and new dysphagia. Due to Covid Pneumonia, likely also bacterial.  Required intubation, extubated on 9/6.   Patient was seen by speech therapist Has completed antibiotic course Continue to monitor oxygen needs   Acute metabolic encephalopathy Underlying Dementia vs [facility] Delirium Initially related to sepsis which is now resolved.  Ongoing confusion, hallucinations consistent with delirium, not unusual given prolonged admission complicated by ICU/ intubation. At baseline pt is oriented x 3 per family with only mild cognitive impairment.  CT head negative. --Mgmt of underlying issues as outlined --Supportive care -- Continue delirium precautions Continue trazodone at bedtime Avoid benzodiazepines   Pneumonia due to COVID-19 virus Right lower lobe pneumonia Completed Rocephin and azithromycin --Off COVID precautions --Pulmonary hygiene --O2 per protocol --Symptomatic care as needed --Completed steroids 9/10   CAP (community acquired pneumonia) Patient has completed a course of antibiotics Suspect secondary bacterial pneumonia in setting of Covid-19   Hypophosphatemia Resolved with replacement 9/7, 9/8 Monitor & replace PRN   Acute HFrEF (heart failure with reduced ejection fraction) (HCC) Elevated Troponin / Demand Ischemia ?Covid-related myocarditis Echocardiogram 04/17/23: LVEF severely depressed, the proximal 1/4 of LV thickens but rest of ventricle is severely hypokinetic, concerning for Takotsubo's Cardiomyopathy. Akinesis/dyskinesis of distal 2/3 of RV with aneurysmal dilation in mid region, RV sytolic function moderately reduced, moderate Aortic regurgitation -Maintain MAP >65 --BP's stable off pressors --hs-Troponin peaked at 3,734 Cardiology on board and case discussed continue aspirin and statin therapy Continue Farxiga, metoprolol, lisinopril We will continue to titrate  medication for GDMT as blood pressure allows I have discussed the case with cardiologist Lasix have been switched to as needed by cardiology team as patient appears euvolemic     Anemia Improving. Uncertain acuity.  Hemoglobin 7.8 on admission, down from 13 a year ago. 9/7 - ICU RN reports pt has been having dark stools.  Anticoagulation was held and she was given 2 units pRBC's on 9/4 and 9/5.   --Monitor for bleeding signs --Daily CBC's Continue monitoring Hb   AKI (acute kidney injury) (HCC) Resolved.  Creatinine 1.78 on admission.  up from baseline of 0.51, likely prerenal and related to sepsis. Continue to monitor renal function closely   History of stroke ASA was held and due to anemia requiring transfusion, resume. Continue statin therapy   Depression with anxiety Insomnia Continue home trazodone at bedtime PRN   Urinary tract infection History of Pseudomonas and ESBL E. coli UTI 11/2021 Urine reportedly foul-smelling with EMS.  Urine culture insignificant growth. Completed antibiotics for pneumonia as outlined.     Rhabdomyolysis CK near 1500 on admission, unknown whether traumatic or not.  Patient was found on the bed. Resolved with IV hydration.   Dysphagia Seems related to acute delirium. Pt was seen by SLP and on regular diet earlier this admission, pre-intubation.  Since extubation, pt's mental status remains altered and poor awareness in general. Ongoing wet sounding cough. --SLP following, appreciate recommendations --On pureed diet with nectar-thickened liquids.  Diet advancement per SLP Continue assistance with feeding   Generalized weakness Due to prolonged & complicated hospitalization. Continue PT OT   Dementia (HCC) Continue Aricept Continue delirium precaution  Avoid benzodiazepine     Sepsis with acute hypoxic respiratory failure without septic shock (HCC) POA, now resolved  Physical Exam: General exam: awake, with intermittent  confusion, frail and chronically ill appearing HEENT: moist mucus membranes, hearing grossly normal  Respiratory system: lungs clear but coarse upper airway secretion sounds, diminished bases due to poor inspiratory volumes, normal respiratory effort on room air Cardiovascular system: normal S1/S2, RRR, no pedal edema.   Gastrointestinal system: soft, NT, ND Central nervous system: A&O x self. no gross focal neurologic deficits, normal speech Extremities: moves all, no edema, normal tone Skin: dry, intact, normal temperature Psychiatry: normal mood, congruent affect, abnormal judgement and insight, visual hallucinations noted      Data Reviewed: I have reviewed patient's CBC, BMP as well as previous provider documentation, I have reviewed transition of care manager documentation and cardiologist documentation        Latest Ref Rng & Units 05/05/2023    3:18 AM 05/04/2023    5:19 AM 05/03/2023    4:01 AM  CBC  WBC 4.0 - 10.5 K/uL 9.6  8.8  15.7   Hemoglobin 12.0 - 15.0 g/dL 8.9  8.9  95.2   Hematocrit 36.0 - 46.0 % 26.5  26.8  33.7   Platelets 150 - 400 K/uL 191  178  224        Latest Ref Rng & Units 05/05/2023    2:10 PM 05/05/2023    3:18 AM 05/04/2023    5:19 AM  CMP  Glucose 70 - 99 mg/dL 841  324  81   BUN 8 - 23 mg/dL 28  27  33   Creatinine 0.44 - 1.00 mg/dL 4.01  0.27  2.53   Sodium 135 - 145 mmol/L 136  135  141   Potassium 3.5 - 5.1 mmol/L 3.3  2.9  3.6   Chloride 98 - 111 mmol/L 103  103  113   CO2 22 - 32 mmol/L 23  24  23    Calcium 8.9 - 10.3 mg/dL 7.8  7.7  7.9       Urine culture 9/4 - insignificant growth Blood cultures 9/5 - negative to date Sputum culture - normal respiratory flora   C-diff negative 04/13/23 GI panel negative 04/13/23   Blood cultures 8/19 -- +Staph hominis likely contaminant Repeat blood cultures 8/21 - negative & final   Covid-19 POSITIVE on 04/11/23 -- now off precautions       Family Communication: Daughter Victorino Dike updated at  bedside (9/10)   Disposition: Status is: Inpatient Remains inpatient appropriate because: persistent encephalopathy and new dysphagia. Needs further improvement before stable for d/c.    Planned Discharge Destination: Skilled nursing facility       Vitals:   05/04/23 2117 05/05/23 0400 05/05/23 0500 05/05/23 0815  BP: 103/62 (!) 85/53  (!) 91/58  Pulse: (!) 102 78  73  Resp: 20 18  18   Temp: 98.4 F (36.9 C) 98.6 F (37 C)  98.8 F (37.1 C)  TempSrc:      SpO2: (!) 88% 93%  93%  Weight:   51.9 kg   Height:         Author: Loyce Dys, MD 05/05/2023 4:18 PM  For on call review [email or web address].

## 2023-05-05 NOTE — Progress Notes (Signed)
Patient refused all morning medications stating that she did not want medications, she wants water and pineapple juice.  I verified orientation and patient oriented to person. Place and time but could not remember why she was in the hospital.  I explained why she needed each medication and patient states that she does not want to take anymore medications.  Patient with 2 IVs in her left arm.  Both flush without difficulty.  IV potassium started in proximal IV site.  Site checked after infusion started and IV had infiltrated.  Infusion stopped, attending and pharmacist notified and IV team consult entered.  Warm compress applied to site per pharmacist reommendation.  Will continue to monitor site.

## 2023-05-05 NOTE — Plan of Care (Signed)
  Problem: Fluid Volume: Goal: Hemodynamic stability will improve Outcome: Progressing   Problem: Clinical Measurements: Goal: Diagnostic test results will improve Outcome: Progressing Goal: Signs and symptoms of infection will decrease Outcome: Progressing   Problem: Respiratory: Goal: Ability to maintain adequate ventilation will improve Outcome: Progressing   Problem: Coping: Goal: Psychosocial and spiritual needs will be supported Outcome: Progressing   Problem: Respiratory: Goal: Will maintain a patent airway Outcome: Progressing Goal: Complications related to the disease process, condition or treatment will be avoided or minimized Outcome: Progressing   Problem: Clinical Measurements: Goal: Ability to maintain clinical measurements within normal limits will improve Outcome: Progressing Goal: Will remain free from infection Outcome: Progressing Goal: Diagnostic test results will improve Outcome: Progressing Goal: Respiratory complications will improve Outcome: Progressing Goal: Cardiovascular complication will be avoided Outcome: Progressing   Problem: Activity: Goal: Risk for activity intolerance will decrease Outcome: Progressing   Problem: Nutrition: Goal: Adequate nutrition will be maintained Outcome: Progressing   Problem: Coping: Goal: Level of anxiety will decrease Outcome: Progressing   Problem: Elimination: Goal: Will not experience complications related to bowel motility Outcome: Progressing Goal: Will not experience complications related to urinary retention Outcome: Progressing   Problem: Pain Managment: Goal: General experience of comfort will improve Outcome: Progressing   Problem: Safety: Goal: Ability to remain free from injury will improve Outcome: Progressing   Problem: Skin Integrity: Goal: Risk for impaired skin integrity will decrease Outcome: Progressing   Problem: Coping: Goal: Ability to adjust to condition or change in  health will improve Outcome: Progressing   Problem: Fluid Volume: Goal: Ability to maintain a balanced intake and output will improve Outcome: Progressing   Problem: Metabolic: Goal: Ability to maintain appropriate glucose levels will improve Outcome: Progressing   Problem: Nutritional: Goal: Maintenance of adequate nutrition will improve Outcome: Progressing Goal: Progress toward achieving an optimal weight will improve Outcome: Progressing   Problem: Skin Integrity: Goal: Risk for impaired skin integrity will decrease Outcome: Progressing   Problem: Tissue Perfusion: Goal: Adequacy of tissue perfusion will improve Outcome: Progressing   Problem: Respiratory: Goal: Ability to maintain a clear airway and adequate ventilation will improve Outcome: Progressing   Problem: Role Relationship: Goal: Method of communication will improve Outcome: Progressing

## 2023-05-05 NOTE — Progress Notes (Signed)
PHARMACY CONSULT NOTE - ELECTROLYTES  Pharmacy Consult for Electrolyte Monitoring and Replacement   Recent Labs: Height: 5' (152.4 cm) Weight: 51.9 kg (114 lb 6.7 oz) IBW/kg (Calculated) : 45.5 Estimated Creatinine Clearance: 43.6 mL/min (A) (by C-G formula based on SCr of 0.38 mg/dL (L)). Potassium (mmol/L)  Date Value  05/05/2023 2.9 (L)  11/26/2013 3.8   Magnesium (mg/dL)  Date Value  44/08/270 1.8   Calcium (mg/dL)  Date Value  53/66/4403 7.7 (L)   Calcium, Total (mg/dL)  Date Value  47/42/5956 9.5   Albumin (g/dL)  Date Value  38/75/6433 1.9 (L)  04/30/2015 4.3  11/26/2013 3.9   Phosphorus (mg/dL)  Date Value  29/51/8841 2.7   Sodium (mmol/L)  Date Value  05/05/2023 135  04/30/2015 140  11/26/2013 142   Corrected Calcium: 9.58 mg/dL  Assessment  Teresa Sherman is a 77 y.o. female presenting with Severe sepsis secondary to pneumonia. PMH significant for CVA, Anemia, and depression. Pharmacy has been consulted to monitor and replace electrolytes.  Diet/Nutrition: Dysphagia 1 diet ordered, Juven q packet BID.   Goal of Therapy: Electrolytes WNL  Plan:  - K 2.9: Kcl IV x 1 and Kcl PO BID, both ordered by attending physician - F/u tomorrow with AM labs.  Thank you for involving pharmacy in this patient's care.   Bettey Costa, PharmD Clinical Pharmacist 05/05/2023 7:58 AM

## 2023-05-06 DIAGNOSIS — A419 Sepsis, unspecified organism: Secondary | ICD-10-CM | POA: Diagnosis not present

## 2023-05-06 DIAGNOSIS — R6520 Severe sepsis without septic shock: Secondary | ICD-10-CM | POA: Diagnosis not present

## 2023-05-06 LAB — CBC
HCT: 25 % — ABNORMAL LOW (ref 36.0–46.0)
Hemoglobin: 8.5 g/dL — ABNORMAL LOW (ref 12.0–15.0)
MCH: 31.7 pg (ref 26.0–34.0)
MCHC: 34 g/dL (ref 30.0–36.0)
MCV: 93.3 fL (ref 80.0–100.0)
Platelets: 199 10*3/uL (ref 150–400)
RBC: 2.68 MIL/uL — ABNORMAL LOW (ref 3.87–5.11)
RDW: 15.2 % (ref 11.5–15.5)
WBC: 9.1 10*3/uL (ref 4.0–10.5)
nRBC: 0 % (ref 0.0–0.2)

## 2023-05-06 LAB — BASIC METABOLIC PANEL
Anion gap: 7 (ref 5–15)
BUN: 30 mg/dL — ABNORMAL HIGH (ref 8–23)
CO2: 23 mmol/L (ref 22–32)
Calcium: 7.6 mg/dL — ABNORMAL LOW (ref 8.9–10.3)
Chloride: 105 mmol/L (ref 98–111)
Creatinine, Ser: 0.34 mg/dL — ABNORMAL LOW (ref 0.44–1.00)
GFR, Estimated: >60 mL/min (ref >60)
Glucose, Bld: 134 mg/dL — ABNORMAL HIGH (ref 70–99)
Potassium: 3.6 mmol/L (ref 3.5–5.1)
Sodium: 135 mmol/L (ref 135–145)

## 2023-05-06 LAB — GLUCOSE, CAPILLARY
Glucose-Capillary: 112 mg/dL — ABNORMAL HIGH (ref 70–99)
Glucose-Capillary: 132 mg/dL — ABNORMAL HIGH (ref 70–99)
Glucose-Capillary: 143 mg/dL — ABNORMAL HIGH (ref 70–99)
Glucose-Capillary: 84 mg/dL (ref 70–99)

## 2023-05-06 LAB — MAGNESIUM: Magnesium: 1.9 mg/dL (ref 1.7–2.4)

## 2023-05-06 LAB — PHOSPHORUS: Phosphorus: 2.4 mg/dL — ABNORMAL LOW (ref 2.5–4.6)

## 2023-05-06 MED ORDER — FAMOTIDINE 20 MG PO TABS: 20.0000 mg | ORAL_TABLET | Freq: Every day | ORAL | Status: AC

## 2023-05-06 MED ORDER — DOCUSATE SODIUM 50 MG/5ML PO LIQD: 100.0000 mg | Freq: Two times a day (BID) | ORAL | Status: AC

## 2023-05-06 MED ORDER — LISINOPRIL 2.5 MG PO TABS: 2.5000 mg | ORAL_TABLET | Freq: Every day | ORAL | Status: DC

## 2023-05-06 MED ORDER — POLYETHYLENE GLYCOL 3350 17 G PO PACK: 17.0000 g | PACK | Freq: Every day | ORAL | Status: AC | PRN

## 2023-05-06 MED ORDER — MELATONIN 5 MG PO TABS: 5.0000 mg | ORAL_TABLET | Freq: Every day | ORAL | Status: DC

## 2023-05-06 MED ORDER — DAPAGLIFLOZIN PROPANEDIOL 10 MG PO TABS: 10.0000 mg | ORAL_TABLET | Freq: Every day | ORAL | Status: AC

## 2023-05-06 MED ORDER — K PHOS MONO-SOD PHOS DI & MONO 155-852-130 MG PO TABS
500.0000 mg | ORAL_TABLET | Freq: Once | ORAL | Status: AC
  Administered 2023-05-06: 500 mg via ORAL
  Filled 2023-05-06: qty 2

## 2023-05-06 MED ORDER — APIXABAN 5 MG PO TABS: ORAL_TABLET | ORAL | Status: AC

## 2023-05-06 MED ORDER — FUROSEMIDE 20 MG PO TABS: 20.0000 mg | ORAL_TABLET | Freq: Every day | ORAL | Status: AC | PRN

## 2023-05-06 MED ORDER — METOPROLOL SUCCINATE ER 25 MG PO TB24: 12.5000 mg | ORAL_TABLET | Freq: Every day | ORAL | Status: AC

## 2023-05-06 MED ORDER — LISINOPRIL 5 MG PO TABS
2.5000 mg | ORAL_TABLET | Freq: Every day | ORAL | Status: DC
  Administered 2023-05-06: 2.5 mg via ORAL
  Filled 2023-05-06: qty 1

## 2023-05-06 NOTE — TOC Transition Note (Signed)
Transition of Care [facility]) - CM/SW Discharge Note   Patient Details  Name: Teresa Sherman MRN: [id number] Date of Birth: [birth year]  [facility] CM/SW Contact:  Hetty Ely, RN Phone Number: 05/06/2023, 11:47 AM   Clinical Narrative: To discharge to [facility] room 804, RN to call report [phone]. [facility] arranged.      Final next level of care: Skilled Nursing Facility Barriers to Discharge: Barriers Resolved   Patient Goals and CMS Choice      Discharge Placement                Patient chooses bed at: [facility] Patient to be transferred to facility by: [facility] Name of family member notified: MD spoke with [place] Patient and family notified of of transfer: 05/06/23  Discharge Plan and Services Additional resources added to the After Visit Summary for                  DME Arranged: N/A DME Agency: NA       HH Arranged: NA HH Agency: NA        Social Determinants of Health (SDOH) Interventions SDOH Screenings   Food Insecurity: No Food Insecurity (04/13/2023)  Housing: Low Risk  (04/13/2023)  Transportation Needs: No Transportation Needs (04/13/2023)  Utilities: Not At Risk (04/13/2023)  Financial Resource Strain: Low Risk  (06/14/2021)   Received from [facility]  Physical Activity: Sufficiently Active (10/30/2021)   Received from [facility]  Social Connections: Socially Isolated (10/30/2021)   Received from [facility]  Stress: No Stress Concern Present (10/30/2021)   Received from [facility]  Tobacco Use: Low Risk  (04/11/2023)  Health Literacy: Medium Risk (10/30/2021)   Received from [facility]     Readmission Risk Interventions     No data to display

## 2023-05-06 NOTE — Progress Notes (Signed)
[facility] CLINIC CARDIOLOGY PROGRESS NOTE   Patient ID: Teresa Sherman MRN: [id number] DOB/AGE: Feb 20, 1946 77 y.o.  Admit date: 04/11/2023 Referring Physician Dr. Joylene Igo Primary Physician Dr [name]  Primary Cardiologist none Reason for Consultation LV dysfunction  HPI: Teresa Sherman is a 77 y.o. female with a past medical history of CVA who presented to the ED on 04/11/2023 for SOB. She was seen by cardiology early in her admission for elevated troponin, NSTEMI in setting of covid infection. Cardiology reconsulted 8/30 for additional evaluation of severe LV dysfunction.  Interval History:  -Patient feeling ok this AM, states she is ready to go to facility.  -Denies SOB, CP, palpitations. Remains without LE edema.  -Potassium improved on AM labs.  Review of systems complete and found to be negative unless listed above    Vitals:   05/05/23 2117 05/06/23 0342 05/06/23 0500 05/06/23 0734  BP: (!) 93/58 94/62  102/68  Pulse: 100   (!) 102  Resp: 19 18  16   Temp: 98.7 F (37.1 C) 99.2 F (37.3 C)  97.6 F (36.4 C)  TempSrc: Oral     SpO2: 96% 91%  93%  Weight:   32.9 kg   Height:        No intake or output data in the 24 hours ending 05/06/23 1152   PHYSICAL EXAM General: elderly female, in no acute distress sitting upright in bedside chair with daughter present at bedside. HEENT: Normocephalic and atraumatic. Neck: No JVD.  Lungs: Normal respiratory effort on 2L [place]. Coarse breath sounds bilaterally.  Heart: HRRR. Normal S1 and S2 without gallops or murmurs. Radial & DP pulses 2+ bilaterally. Abdomen: Non-distended appearing.  Msk: Normal strength and tone for age. Extremities: No clubbing, cyanosis or edema.  Warm & well perfused.   LABS: Basic Metabolic Panel: Recent Labs    05/04/23 0519 05/05/23 0318 05/05/23 1410 05/06/23 0332  NA 141 135 136 135  K 3.6 2.9* 3.3* 3.6  CL 113* 103 103 105  CO2 23 24 23 23   GLUCOSE 81 104* 139* 134*  BUN 33* 27*  28* 30*  CREATININE 0.35* 0.38* 0.38* 0.34*  CALCIUM 7.9* 7.7* 7.8* 7.6*  MG 2.2 1.8  --  1.9  PHOS 2.7  --   --  2.4*   Liver Function Tests: No results for input(s): "AST", "ALT", "ALKPHOS", "BILITOT", "PROT", "ALBUMIN" in the last 72 hours.  No results for input(s): "LIPASE", "AMYLASE" in the last 72 hours. CBC: Recent Labs    05/05/23 0318 05/06/23 0332  WBC 9.6 9.1  HGB 8.9* 8.5*  HCT 26.5* 25.0*  MCV 95.0 93.3  PLT 191 199   Cardiac Enzymes: Recent Labs    05/05/23 0318 05/05/23 0516  TROPONINIHS 18* 17   BNP: Recent Labs    05/05/23 0318  BNP 70.7    D-Dimer: No results for input(s): "DDIMER" in the last 72 hours. Hemoglobin A1C: No results for input(s): "HGBA1C" in the last 72 hours.  Fasting Lipid Panel: No results for input(s): "CHOL", "HDL", "LDLCALC", "TRIG", "CHOLHDL", "LDLDIRECT" in the last 72 hours. Thyroid Function Tests: No results for input(s): "TSH", "T4TOTAL", "T3FREE", "THYROIDAB" in the last 72 hours.  Invalid input(s): "FREET3" Anemia Panel: No results for input(s): "VITAMINB12", "FOLATE", "FERRITIN", "TIBC", "IRON", "RETICCTPCT" in the last 72 hours.   No results found.  ECHO 04/22/2023: 1. Left ventricular ejection fraction, by estimation, is 30 to 35%. Left ventricular ejection fraction by 3D volume is 31 %. Left ventricular ejection fraction by  PLAX is 34 %. The left ventricle has moderately decreased function. The left ventricle demonstrates global hypokinesis. Left ventricular diastolic parameters are consistent with Grade I diastolic dysfunction (impaired relaxation).   2. Right ventricular systolic function is mildly reduced. The right ventricular size is normal.   3. The mitral valve is normal in structure. Mild mitral valve regurgitation. No evidence of mitral stenosis.   4. The aortic valve has an indeterminant number of cusps. Aortic valve regurgitation is moderate. No aortic stenosis is present.   5. The inferior vena cava  is normal in size with greater than 50% respiratory variability, suggesting right atrial pressure of 3 mmHg.   TELEMETRY reviewed by me 05/06/23: not on tele  EKG reviewed by me 05/06/23: NSR rate 92 bpm, T wave inversions in aVL, V2  DATA reviewed by me 05/06/23: last 24h vitals tele labs imaging I/O, hospitalist progress notes, respiratory notes, nursing notes  Principal Problem:   Severe sepsis (HCC) Active Problems:   Depression with anxiety   History of stroke   CAP (community acquired pneumonia)   Pneumonia due to COVID-19 virus   Rhabdomyolysis   AKI (acute kidney injury) (HCC)   Acute metabolic encephalopathy   Urinary tract infection   Anemia   Acute on chronic respiratory failure with hypoxia (HCC)   Sepsis with acute hypoxic respiratory failure without septic shock (HCC)   Acute deep vein thrombosis (DVT) of left peroneal vein (HCC)   Acute HFrEF (heart failure with reduced ejection fraction) (HCC)   Elevated troponin   Hypophosphatemia   Dementia (HCC)   Generalized weakness   Dysphagia    ASSESSMENT AND PLAN: Teresa Sherman is a 77 y.o. female with a past medical history of CVA who presented to the ED on 04/11/2023 for SOB. She was seen by cardiology early in her admission for elevated troponin, NSTEMI in setting of covid infection. Cardiology reconsulted 8/30 for additional evaluation of severe LV dysfunction.  # Takotsubo cardiomyopathy vs covid-19 viral myocarditis # Acute heart failure reduced ejection fraction # NSTEMI Patient admitted with COVID-19 infection found to have NSTEMI on hospital day 5. Echo revealed severely reduced LV function. Was started on GDMT after BP was able to tolerate and doses titrated. Developed hypotension requiring pressor support on 9/4. Weaned off of pressors and transferred out off ICU 9/8. Currently euvolemic on exam.  -Continue farxiga. Lasix changed to prn given stable fluid status.  -Continue lisinopril, metoprolol. Hold  parameters in place.  -Goal is to uptitrate doses as HR/BP and renal function allow. Daughter reports her BP usually runs low.  # Hypokalemia K down to 2.9 yesterday, improved to 3.6 today. -Monitor and replenish electrolytes for a goal K >4, Mag >2   # Acute metabolic encephalopathy Patient oriented x3 this AM.  -Management per primary  # Septic shock 2/2 ESBL UTI # Sinus tachycardia, resolved Patient with rates elevated 9/3, up to 150s. Rates improved with sedation and treating underlying sepsis while in the ICU.  -S/p Meropenem and vancomycin  # Acute hypoxic respiratory failure # Covid-19 pneumonia Had increased oxygen requirement on BiPAP earlier in admission then weaned to room air by 9/3. Respiratory status declined overnight on 9/4 requiring intubation and mechanical ventilation. Extubated on 9/6. Now on room air. -Management per primary.   # Acute DVT # Anemia DVT found in LLE on [place] 04/22/2023.  -Eliquis switched to heparin on 9/4 then held due to anemia requiring blood transfusion overnight on 9/4. Hemoglobin uptrending & eliquis  restarted 9/10. -Continue to monitor H&H closely.  Patient is stable for discharge to rehab facility today from cardiac perspective. Per daughter, plan is for patient to establish care with cardiologist at [facility] as she goes to [facility] for primary care. Daughter to set up appointment on discharge.  This patient's case was discussed and created with Dr. Darrold Junker and he is in agreement.  Signed:  Gale Journey, PA-C  05/06/2023, 11:52 AM [facility] Cardiology

## 2023-05-06 NOTE — Progress Notes (Signed)
PHARMACY CONSULT NOTE - ELECTROLYTES  Pharmacy Consult for Electrolyte Monitoring and Replacement   Recent Labs: Height: 5' (152.4 cm) Weight: 32.9 kg (72 lb 8.5 oz) IBW/kg (Calculated) : 45.5 Estimated Creatinine Clearance: 31.6 mL/min (A) (by C-G formula based on SCr of 0.34 mg/dL (L)). Potassium (mmol/L)  Date Value  05/06/2023 3.6  11/26/2013 3.8   Magnesium (mg/dL)  Date Value  40/34/7425 1.9   Calcium (mg/dL)  Date Value  95/63/8756 7.6 (L)   Calcium, Total (mg/dL)  Date Value  43/32/9518 9.5   Albumin (g/dL)  Date Value  84/16/6063 1.9 (L)  04/30/2015 4.3  11/26/2013 3.9   Phosphorus (mg/dL)  Date Value  01/60/1093 2.4 (L)   Sodium (mmol/L)  Date Value  05/06/2023 135  04/30/2015 140  11/26/2013 142   Corrected Calcium: 9.28 mg/dL  Assessment  Teresa Sherman is a 77 y.o. female presenting with Severe sepsis secondary to pneumonia. PMH significant for CVA, Anemia, and depression. Pharmacy has been consulted to monitor and replace electrolytes.  Diet/Nutrition: Dysphagia 1 diet ordered, Juven q packet BID.  Pertinent Medications: Kcl BID  Goal of Therapy: Electrolytes WNL  Plan:  - Phos 2.4: Kphos 500mg  PO x 1 - Will continue Kcl BID for now as K on lower end of normal - F/u tomorrow with AM labs.  Thank you for involving pharmacy in this patient's care.   Bettey Costa, PharmD Clinical Pharmacist 05/06/2023 7:33 AM

## 2023-05-06 NOTE — Discharge Summary (Signed)
Physician Discharge Summary   Patient: Teresa Sherman MRN: [id number] DOB: October 31, 1945  Admit date:     04/11/2023  Discharge date: 05/06/23  Discharge Physician: Loyce Dys   [facility]   Recommendations at discharge:  Follow-up with your primary care physician  Discharge Diagnoses: Severe sepsis (HCC) Acute deep vein thrombosis (DVT) of left peroneal vein (HCC) Acute on chronic respiratory failure with hypoxia (HCC) Possible aspiration pneumonia Acute metabolic encephalopathy Underlying Dementia vs [facility] Delirium Pneumonia due to COVID-19 virus Right lower lobe pneumonia CAP (community acquired pneumonia) Hypophosphatemia Acute HFrEF (heart failure with reduced ejection fraction) (HCC) Elevated Troponin / Demand Ischemia ?Covid-related myocarditis Anemia AKI (acute kidney injury) (HCC) History of stroke Depression with anxiety Insomnia Urinary tract infection History of Pseudomonas and ESBL E. coli UTI 11/2021 Rhabdomyolysis Dysphagia Generalized weakness Dementia (HCC) Sepsis with acute hypoxic respiratory failure without septic shock [facility])   Hospital Course: 77 y.o. female with medical history significant for Prior stroke, depression with anxiety, prior ESBL E. coli, admitted 04/11/2023 with Sepsis, Acute Metabolic Encephalopathy, and Acute Hypoxic Respiratory Failure in the setting of COVID-19 Pneumonia and suspected superimposed bacterial pneumonia.   She was found after wellness check by her niece when family was not able to reach patient for two days.  She was found somnolent and confused in her bed and had unintelligible mumbling but without focal neurologic deficits. She is reportedly oriented x 3 at baseline. She was also noted by EMS to have foul-smelling urine.   Initial Vitals: HR to 115, RR 23 with O2 sat 90 to 95% on room air, BP 101/83 improving to 126/87 with IV fluids  Significant Labs: WBC of 8.1 but lactic acid 2.4 and  procalcitonin 7.73. Hemoglobin 7.8 with baseline around 13 a year ago. CMP notable for creatinine of 1.78, up from 0.51 a year ago, AST/ALT 45/23. COVID-positive. [id number].  Imaging Chest X-ray showed Right lower airspace opacity.    Pt was admitted to hospitalist service initially.   Prolonged and complicated hospital course as outlined below:   Significant Hospital Events 8/19: Admitted by [facility]. 8/21-8/27 (per [facility]): severe sepsis secondary to COVID19 pneumonia & possible co-bacterial infection. Completed IV rocephin, azithromycin x 5 days, later given Unasyn >> Zosyn.  Pt was moved to stepdown placed on BiPAP >> heated HFNC.  Discussed code status w/ pt and she wants to continue full code.  8/23: Developed hypotension and increased fiO2 requirements, received IF fluids and albumin, moved to Stepdown.  Troponin elevated, placed on Heparin gtt, Cardiology consulted 8/24: Elevated Troponin felt to be due to demand ischemia, BP too soft for GDMT 8/25: moved back to Progressive Cardiac unit 8/26: Back to Stepdown due to AMS and removing supplemental O2 with desaturations.  8/29: Increasing FiO2 requirements (up to 90% FiO2 via HHFNC).  PCCM consulted with high risk for intubation. 8/30: Decreased HHFNC to 65%/50L. Remains delirious. Found to have LLE DVT. 8/31: did not tolerate BPAP overnight. Remains on significant HHFNC. 9/1: tolerated BPAP overnight. Down to 50%/45L. PCCM signed off to [facility] 9/4: PCCM re-consulted for increased agitation, persistent tachycardia despite Dig, metoprolol and Amiodarone, also hypotensive  despite IVFs bolus now requiring vasopressor. SEVERE HYPOXIA REQUIRING INTUBATION 9/6: Extubated 9/7: [facility] resumed care of patient. On 4 L/min [place] O2.  SLP evaluated, dysphagia-1 diet started.   9/8 - weaned to room air, placed back on 2 L/min O2 due to borderline O2 sats.  PT/OT evaluations. Pt hallucinating, delirious. 9/9-10 - very slowly improving delirium.  Ongoing cough. On  pureed diet (was regular before intubation).  Repeating CXR with rising WBC and persistent cough. 9/11-9/13-mental status normalized and WBC also within normal limits with normal vitals able to engage in conversation.  Patient is therefore ready for discharge today and to follow-up with her outpatient physicians       Consultants: As mentioned above Procedures performed: As mentioned above Disposition: Skilled nursing facility Diet recommendation:  Discharge Diet Orders (From admission, onward)     Start     Ordered   05/06/23 0000  Diet - low sodium heart healthy        05/06/23 1152           Cardiac diet DISCHARGE MEDICATION: Allergies as of 05/06/2023       Reactions   Duloxetine Hcl Nausea And Vomiting   Monosodium Glutamate    migraine   Nitrates, Organic    migraine   Sodium Benzoate [nutritional Supplements]    migraine        Medication List     STOP taking these medications    aspirin-acetaminophen-caffeine 250-250-65 MG tablet Commonly known as: EXCEDRIN MIGRAINE       TAKE these medications    acetaminophen 500 MG tablet Commonly known as: TYLENOL Take 1,000 mg by mouth every 6 (six) hours as needed for fever or pain.   apixaban 5 MG Tabs tablet Commonly known as: ELIQUIS Take 2 tablets (10 mg total) by mouth 2 (two) times daily for 2 days, THEN 1 tablet (5 mg total) 2 (two) times daily. Start taking on: May 06, 2023   aspirin 81 MG chewable tablet Chew 81 mg by mouth daily.   atorvastatin 40 MG tablet Commonly known as: LIPITOR Take 40 mg by mouth daily.   Azelastine HCl 137 MCG/SPRAY Soln Place 1 spray into both nostrils at bedtime.   Biotin 54098 MCG Tbdp Take 1 tablet by mouth every morning.   dapagliflozin propanediol 10 MG Tabs tablet Commonly known as: FARXIGA Take 1 tablet (10 mg total) by mouth daily. Start taking on: May 07, 2023   docusate 50 MG/5ML liquid Commonly known as: COLACE Take 10 mLs (100 mg  total) by mouth 2 (two) times daily.   donepezil 10 MG tablet Commonly known as: ARICEPT Take 10 mg by mouth at bedtime.   famotidine 20 MG tablet Commonly known as: PEPCID Take 1 tablet (20 mg total) by mouth daily. Start taking on: May 07, 2023   furosemide 20 MG tablet Commonly known as: LASIX Take 1 tablet (20 mg total) by mouth daily as needed for edema or fluid (Increased leg swelling, SOB, weight gain >3 lbs in 1 day).   guaiFENesin 100 MG/5ML liquid Commonly known as: ROBITUSSIN Take 5 mLs by mouth every 4 (four) hours as needed for cough or to loosen phlegm.   lisinopril 2.5 MG tablet Commonly known as: ZESTRIL Take 1 tablet (2.5 mg total) by mouth daily. Start taking on: May 07, 2023   melatonin 5 MG Tabs Take 1 tablet (5 mg total) by mouth at bedtime. What changed:  medication strength how much to take   metoprolol succinate 25 MG 24 hr tablet Commonly known as: TOPROL-XL Take 0.5 tablets (12.5 mg total) by mouth daily. Start taking on: May 07, 2023   polyethylene glycol 17 g packet Commonly known as: MIRALAX / GLYCOLAX Place 17 g into feeding tube daily as needed for mild constipation or moderate constipation.   traZODone 50 MG tablet Commonly known as:  DESYREL Take 25 mg by mouth at bedtime as needed for sleep.               Discharge Care Instructions  (From admission, onward)           Start     Ordered   05/06/23 0000  Change dressing (specify)       Comments: 1. Cleanse sacral wound with saline, pat dry 2. Apply 1/4" thick layer of Santyl to the wound bed, top with saline moist gauze dressing.  3. Top with dry dressing, change daily Patient on AM bath schedule   05/06/23 1152            Contact information for after-discharge care     Destination     [facility] Preferred SNF .   Service: Skilled Nursing Contact information: [address] [place]  [address] [phone]                    Discharge Exam: Filed Weights   05/04/23 0716 05/05/23 0500 05/06/23 0500  Weight: 52.6 kg 51.9 kg 32.9 kg   General exam: awake, with intermittent confusion, frail and chronically ill appearing HEENT: moist mucus membranes, hearing grossly normal  Respiratory system: Decreased in the lung bases Cardiovascular system: normal S1/S2, RRR, no pedal edema.   Gastrointestinal system: soft, NT, ND Central nervous system: A&O x self. no gross focal neurologic deficits, normal speech Extremities: moves all, no edema, normal tone Skin: dry, intact, normal temperature Psychiatry: normal mood    Condition at discharge: good   Discharge time spent:  36 minutes.  Signed: Loyce Dys, MD [facility] 05/06/2023

## 2023-05-06 NOTE — Plan of Care (Signed)
  Problem: Fluid Volume: Goal: Hemodynamic stability will improve Outcome: Completed/Met   Problem: Clinical Measurements: Goal: Diagnostic test results will improve Outcome: Completed/Met Goal: Signs and symptoms of infection will decrease Outcome: Completed/Met   Problem: Respiratory: Goal: Ability to maintain adequate ventilation will improve Outcome: Completed/Met   Problem: Coping: Goal: Psychosocial and spiritual needs will be supported Outcome: Completed/Met   Problem: Respiratory: Goal: Will maintain a patent airway Outcome: Completed/Met Goal: Complications related to the disease process, condition or treatment will be avoided or minimized Outcome: Completed/Met   Problem: Clinical Measurements: Goal: Ability to maintain clinical measurements within normal limits will improve Outcome: Completed/Met Goal: Will remain free from infection Outcome: Completed/Met Goal: Diagnostic test results will improve Outcome: Completed/Met Goal: Respiratory complications will improve Outcome: Completed/Met Goal: Cardiovascular complication will be avoided Outcome: Completed/Met   Problem: Activity: Goal: Risk for activity intolerance will decrease Outcome: Completed/Met   Problem: Nutrition: Goal: Adequate nutrition will be maintained Outcome: Completed/Met   Problem: Coping: Goal: Level of anxiety will decrease Outcome: Completed/Met   Problem: Elimination: Goal: Will not experience complications related to bowel motility Outcome: Completed/Met Goal: Will not experience complications related to urinary retention Outcome: Completed/Met   Problem: Pain Managment: Goal: General experience of comfort will improve Outcome: Completed/Met   Problem: Safety: Goal: Ability to remain free from injury will improve Outcome: Completed/Met   Problem: Skin Integrity: Goal: Risk for impaired skin integrity will decrease Outcome: Completed/Met   Problem: Coping: Goal:  Ability to adjust to condition or change in health will improve Outcome: Completed/Met   Problem: Fluid Volume: Goal: Ability to maintain a balanced intake and output will improve Outcome: Completed/Met   Problem: Metabolic: Goal: Ability to maintain appropriate glucose levels will improve Outcome: Completed/Met   Problem: Nutritional: Goal: Maintenance of adequate nutrition will improve Outcome: Completed/Met Goal: Progress toward achieving an optimal weight will improve Outcome: Completed/Met   Problem: Skin Integrity: Goal: Risk for impaired skin integrity will decrease Outcome: Completed/Met   Problem: Tissue Perfusion: Goal: Adequacy of tissue perfusion will improve Outcome: Completed/Met   Problem: Respiratory: Goal: Ability to maintain a clear airway and adequate ventilation will improve Outcome: Completed/Met   Problem: Role Relationship: Goal: Method of communication will improve Outcome: Completed/Met

## 2023-05-06 NOTE — Progress Notes (Signed)
Patient report given to assigned RN [email or web address] any questions and concern at this time.

## 2023-05-06 NOTE — Plan of Care (Signed)
Problem: Fluid Volume: Goal: Hemodynamic stability will improve Outcome: Progressing   Problem: Clinical Measurements: Goal: Diagnostic test results will improve Outcome: Progressing Goal: Signs and symptoms of infection will decrease Outcome: Progressing   Problem: Respiratory: Goal: Ability to maintain adequate ventilation will improve Outcome: Progressing   Problem: Coping: Goal: Psychosocial and spiritual needs will be supported Outcome: Progressing   Problem: Respiratory: Goal: Will maintain a patent airway Outcome: Progressing Goal: Complications related to the disease process, condition or treatment will be avoided or minimized Outcome: Progressing   Problem: Clinical Measurements: Goal: Ability to maintain clinical measurements within normal limits will improve Outcome: Progressing Goal: Will remain free from infection Outcome: Progressing Goal: Diagnostic test results will improve Outcome: Progressing Goal: Respiratory complications will improve Outcome: Progressing Goal: Cardiovascular complication will be avoided Outcome: Progressing   Problem: Activity: Goal: Risk for activity intolerance will decrease Outcome: Progressing   Problem: Nutrition: Goal: Adequate nutrition will be maintained Outcome: Progressing   Problem: Coping: Goal: Level of anxiety will decrease Outcome: Progressing   Problem: Elimination: Goal: Will not experience complications related to bowel motility Outcome: Progressing Goal: Will not experience complications related to urinary retention Outcome: Progressing   Problem: Pain Managment: Goal: General experience of comfort will improve Outcome: Progressing   Problem: Safety: Goal: Ability to remain free from injury will improve Outcome: Progressing   Problem: Skin Integrity: Goal: Risk for impaired skin integrity will decrease Outcome: Progressing   Problem: Coping: Goal: Ability to adjust to condition or change in  health will improve Outcome: Progressing   Problem: Fluid Volume: Goal: Ability to maintain a balanced intake and output will improve Outcome: Progressing   Problem: Metabolic: Goal: Ability to maintain appropriate glucose levels will improve Outcome: Progressing   Problem: Nutritional: Goal: Maintenance of adequate nutrition will improve Outcome: Progressing Goal: Progress toward achieving an optimal weight will improve Outcome: Progressing   Problem: Skin Integrity: Goal: Risk for impaired skin integrity will decrease Outcome: Progressing   Problem: Tissue Perfusion: Goal: Adequacy of tissue perfusion will improve Outcome: Progressing   Problem: Respiratory: Goal: Ability to maintain a clear airway and adequate ventilation will improve Outcome: Progressing   Problem: Role Relationship: Goal: Method of communication will improve Outcome: Progressing

## 2023-12-07 ENCOUNTER — Encounter: Payer: Self-pay | Admitting: Ophthalmology

## 2023-12-07 NOTE — Anesthesia Preprocedure Evaluation (Addendum)
 Anesthesia Evaluation  Patient identified by MRN, date of birth, ID band Patient awake and Patient confused  General Assessment Comment:Pt was alert to person, place, time, event. She had decreased cognitive performance with conversation and questioned who I was after being told.   Reviewed: Allergy & Precautions, H&P , NPO status , Patient's Chart, lab work & pertinent test results  Airway Mallampati: II  TM Distance: >3 FB Neck ROM: full    Dental no notable dental hx.    Pulmonary  States she has long covid syndrome   Pulmonary exam normal        Cardiovascular Normal cardiovascular exam+ Valvular Problems/Murmurs MR   ECHO 10/2023: Summary   1. The left ventricle is normal in size with normal wall thickness.    2. The left ventricular systolic function is normal, LVEF is visually  estimated at > 55%.    3. There is grade I diastolic dysfunction (impaired relaxation).    4. There is mild aortic regurgitation.    5. The right ventricle is normal in size, with normal systolic function.    04-22-23 echo 1. Left ventricular ejection fraction, by estimation, is 30 to 35%. Left  ventricular ejection fraction by 3D volume is 31 %. Left ventricular  ejection fraction by PLAX is 34 %. The left ventricle has moderately  decreased function. The left ventricle  demonstrates global hypokinesis. Left ventricular diastolic parameters are  consistent with Grade I diastolic dysfunction (impaired relaxation).   2. Right ventricular systolic function is mildly reduced. The right  ventricular size is normal.   3. The mitral valve is normal in structure. Mild mitral valve  regurgitation. No evidence of mitral stenosis.   4. The aortic valve has an indeterminant number of cusps. Aortic valve  regurgitation is moderate. No aortic stenosis is present.   5. The inferior vena cava is normal in size with greater than 50%  respiratory variability,  suggesting right atrial pressure of 3 mmHg.   Office note 11-17-23:#HFrEF, possibly secondary to Takotsubo cardiomyopathy vs. Covid 19 myocarditis  Repeat TTE 3/27 done but report pending. Per review of cardiology's note, EF improved to 60%.    Neuro/Psych  PSYCHIATRIC DISORDERS Anxiety     CVA, No Residual Symptoms    GI/Hepatic negative GI ROS, Neg liver ROS,,,  Endo/Other  negative endocrine ROS    Renal/GU      Musculoskeletal   Abdominal   Peds  Hematology  (+) Blood dyscrasia, anemia   Anesthesia Other Findings Allergy  Anxiety Cataract  Depression GERD (gastroesophageal reflux disease) Combined fat and carbohydrate induced hyperlipemia Anemia  Migraine headache Long COVID  Vertigo    Reproductive/Obstetrics negative OB ROS                              Anesthesia Physical Anesthesia Plan  ASA: 3  Anesthesia Plan: MAC   Post-op Pain Management:    Induction: Intravenous  PONV Risk Score and Plan:   Airway Management Planned: Natural Airway and Nasal Cannula  Additional Equipment:   Intra-op Plan:   Post-operative Plan:   Informed Consent: I have reviewed the patients History and Physical, chart, labs and discussed the procedure including the risks, benefits and alternatives for the proposed anesthesia with the patient or authorized representative who has indicated his/her understanding and acceptance.       Plan Discussed with: Anesthesiologist, CRNA and Surgeon  Anesthesia Plan Comments: (Pt's sister in law was  present for consent. )         Anesthesia Quick Evaluation

## 2023-12-12 ENCOUNTER — Ambulatory Visit: Payer: Self-pay | Admitting: Anesthesiology

## 2023-12-12 ENCOUNTER — Other Ambulatory Visit: Payer: Self-pay

## 2023-12-12 ENCOUNTER — Encounter: Payer: Self-pay | Admitting: Ophthalmology

## 2023-12-12 ENCOUNTER — Ambulatory Visit
Admission: RE | Admit: 2023-12-12 | Discharge: 2023-12-12 | Disposition: A | Discharge status: Home / Self Care | Attending: Ophthalmology | Admitting: Ophthalmology

## 2023-12-12 ENCOUNTER — Encounter
Admission: RE | Disposition: A | Discharge status: Home / Self Care | Payer: Self-pay | Source: Home / Self Care | Attending: Ophthalmology

## 2023-12-12 DIAGNOSIS — H2511 Age-related nuclear cataract, right eye: Secondary | ICD-10-CM | POA: Insufficient documentation

## 2023-12-12 HISTORY — DX: Unspecified systolic (congestive) heart failure: I50.20

## 2023-12-12 HISTORY — DX: Dizziness and giddiness: R42

## 2023-12-12 HISTORY — DX: Nonrheumatic aortic (valve) insufficiency: I35.1

## 2023-12-12 HISTORY — DX: Migraine, unspecified, not intractable, without status migrainosus: G43.909

## 2023-12-12 HISTORY — DX: Deep phlebothrombosis in pregnancy, unspecified trimester: O22.30

## 2023-12-12 HISTORY — DX: Chronic cough: R05.3

## 2023-12-12 HISTORY — DX: Nonrheumatic mitral (valve) insufficiency: I34.0

## 2023-12-12 HISTORY — PX: CATARACT EXTRACTION W/PHACO: SHX586

## 2023-12-12 HISTORY — DX: Post covid-19 condition, unspecified: U09.9

## 2023-12-12 SURGERY — PHACOEMULSIFICATION, CATARACT, WITH IOL INSERTION
Anesthesia: Monitor Anesthesia Care | Site: Eye | Laterality: Right

## 2023-12-12 MED ORDER — ONDANSETRON HCL 4 MG/2ML IJ SOLN
4.0000 mg | Freq: Once | INTRAMUSCULAR | Status: AC
  Administered 2023-12-12: 4 mg via INTRAVENOUS

## 2023-12-12 MED ORDER — SIGHTPATH DOSE#1 BSS IO SOLN
INTRAOCULAR | Status: DC | PRN
  Administered 2023-12-12: 95 mL via OPHTHALMIC

## 2023-12-12 MED ORDER — TETRACAINE HCL 0.5 % OP SOLN
1.0000 [drp] | OPHTHALMIC | Status: DC | PRN
  Administered 2023-12-12 (×3): 1 [drp] via OPHTHALMIC

## 2023-12-12 MED ORDER — TETRACAINE HCL 0.5 % OP SOLN
OPHTHALMIC | Status: AC
  Filled 2023-12-12: qty 4

## 2023-12-12 MED ORDER — ARMC OPHTHALMIC DILATING DROPS
OPHTHALMIC | Status: AC
  Filled 2023-12-12: qty 0.5

## 2023-12-12 MED ORDER — FENTANYL CITRATE (PF) 100 MCG/2ML IJ SOLN
INTRAMUSCULAR | Status: AC
  Filled 2023-12-12: qty 2

## 2023-12-12 MED ORDER — SIGHTPATH DOSE#1 BSS IO SOLN
INTRAOCULAR | Status: DC | PRN
  Administered 2023-12-12: 15 mL via INTRAOCULAR

## 2023-12-12 MED ORDER — LIDOCAINE HCL (PF) 2 % IJ SOLN
INTRAOCULAR | Status: DC | PRN
  Administered 2023-12-12: 4 mL via INTRAOCULAR

## 2023-12-12 MED ORDER — MIDAZOLAM HCL 2 MG/2ML IJ SOLN
INTRAMUSCULAR | Status: AC
  Filled 2023-12-12: qty 2

## 2023-12-12 MED ORDER — ONDANSETRON HCL 4 MG/2ML IJ SOLN
INTRAMUSCULAR | Status: AC
Start: 2023-12-12 — End: ?
  Filled 2023-12-12: qty 2

## 2023-12-12 MED ORDER — SIGHTPATH DOSE#1 NA HYALUR & NA CHOND-NA HYALUR IO KIT
PACK | INTRAOCULAR | Status: DC | PRN
  Administered 2023-12-12: 1 via OPHTHALMIC

## 2023-12-12 MED ORDER — ARMC OPHTHALMIC DILATING DROPS
1.0000 | OPHTHALMIC | Status: DC | PRN
  Administered 2023-12-12 (×3): 1 via OPHTHALMIC

## 2023-12-12 MED ORDER — MOXIFLOXACIN HCL 0.5 % OP SOLN
OPHTHALMIC | Status: DC | PRN
  Administered 2023-12-12: .2 mL via OPHTHALMIC

## 2023-12-12 MED ORDER — FENTANYL CITRATE (PF) 100 MCG/2ML IJ SOLN
INTRAMUSCULAR | Status: DC | PRN
  Administered 2023-12-12 (×2): 25 ug via INTRAVENOUS

## 2023-12-12 SURGICAL SUPPLY — 12 items
CATARACT SUITE SIGHTPATH (MISCELLANEOUS) ×1 IMPLANT
DISSECTOR HYDRO NUCLEUS 50X22 (MISCELLANEOUS) ×1 IMPLANT
FEE CATARACT SUITE SIGHTPATH (MISCELLANEOUS) ×1 IMPLANT
GLOVE PI ULTRA LF STRL 7.5 (GLOVE) ×1 IMPLANT
GLOVE SURG POLYISOPRENE 8.5 (GLOVE) ×1 IMPLANT
GLOVE SURG PROTEXIS BL SZ6.5 (GLOVE) ×1 IMPLANT
GLOVE SURG SYN 6.5 PF PI BL (GLOVE) ×1 IMPLANT
GLOVE SURG SYN 8.5 PF PI BL (GLOVE) ×1 IMPLANT
LENS IOL TECNIS EYHANCE 21.5 (Intraocular Lens) IMPLANT
NDL FILTER BLUNT 18X1 1/2 (NEEDLE) ×1 IMPLANT
NEEDLE FILTER BLUNT 18X1 1/2 (NEEDLE) ×1 IMPLANT
SYR 3ML LL SCALE MARK (SYRINGE) ×1 IMPLANT

## 2023-12-12 NOTE — Discharge Instructions (Signed)

## 2023-12-12 NOTE — Op Note (Signed)
 OPERATIVE NOTE  Samie Barclift [id number] 12/12/2023   PREOPERATIVE DIAGNOSIS:  Nuclear sclerotic cataract right eye.  H25.11   POSTOPERATIVE DIAGNOSIS:    Nuclear sclerotic cataract right eye.     PROCEDURE:  Phacoemusification with posterior chamber intraocular lens placement of the right eye   LENS:   Implant Name Type Inv. Item Serial No. Manufacturer Lot No. LRB No. Used Action  LENS IOL TECNIS EYHANCE 21.5 - [id number] Intraocular Lens LENS IOL TECNIS EYHANCE 21.5 [id number] [employer]  Right 1 Implanted       Procedure(s): PHACOEMULSIFICATION, CATARACT, WITH IOL INSERTION 12.17 01:13.2 (Right)  SURGEON:  Dusty Gin, MD, MPH  ANESTHESIOLOGIST: Anesthesiologist: Baltazar Bonier, MD CRNA: Annamarie Kid, CRNA   ANESTHESIA:  Topical with tetracaine  drops augmented with 1% preservative-free intracameral lidocaine .  ESTIMATED BLOOD LOSS: less than 1 mL.   COMPLICATIONS:  None.   DESCRIPTION OF PROCEDURE:  The patient was identified in the holding room and transported to the operating room and placed in the supine position under the operating microscope.  The right eye was identified as the operative eye and it was prepped and draped in the usual sterile ophthalmic fashion.   A 1.0 millimeter clear-corneal paracentesis was made at the 10:30 position. 0.5 ml of preservative-free 1% lidocaine  with epinephrine  was injected into the anterior chamber.  The anterior chamber was filled with viscoelastic.  A 2.4 millimeter keratome was used to make a near-clear corneal incision at the 8:00 position.  A curvilinear capsulorrhexis was made with a cystotome and capsulorrhexis forceps.  Balanced salt  solution was used to hydrodissect and hydrodelineate the nucleus.   Phacoemulsification was then used in stop and chop fashion to remove the lens nucleus and epinucleus.  The remaining cortex was then removed using the irrigation and aspiration handpiece. Viscoelastic was then  placed into the capsular bag to distend it for lens placement.  A lens was then injected into the capsular bag.  The remaining viscoelastic was aspirated.   Wounds were hydrated with balanced salt  solution.  The anterior chamber was inflated to a physiologic pressure with balanced salt  solution.   Intracameral vigamox  0.1 mL undiluted was injected into the eye and a drop placed onto the ocular surface.  No wound leaks were noted.  The patient was taken to the recovery room in stable condition without complications of anesthesia or surgery  Dusty Gin 12/12/2023, 12:41 PM

## 2023-12-12 NOTE — Transfer of Care (Signed)
 Immediate Anesthesia Transfer of Care Note  Patient: Teresa Sherman  Procedure(s) Performed: PHACOEMULSIFICATION, CATARACT, WITH IOL INSERTION 12.17 01:13.2 (Right: Eye)  Patient Location: PACU  Anesthesia Type: MAC  Level of Consciousness: awake, alert  and patient cooperative  Airway and Oxygen Therapy: Patient Spontanous Breathing and Patient connected to supplemental oxygen  Post-op Assessment: Post-op Vital signs reviewed, Patient's Cardiovascular Status Stable, Respiratory Function Stable, Patent Airway and No signs of Nausea or vomiting  Post-op Vital Signs: Reviewed and stable  Complications: No notable events documented.
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
## 2023-12-12 NOTE — Anesthesia Postprocedure Evaluation (Signed)
 Anesthesia Post Note  Patient: Conservation officer, historic buildings  Procedure(s) Performed: PHACOEMULSIFICATION, CATARACT, WITH IOL INSERTION 12.17 01:13.2 (Right: Eye)  Patient location during evaluation: PACU Anesthesia Type: MAC Level of consciousness: awake and alert Pain management: pain level controlled Vital Signs Assessment: post-procedure vital signs reviewed and stable Respiratory status: spontaneous breathing, nonlabored ventilation and respiratory function stable Cardiovascular status: stable and blood pressure returned to baseline Postop Assessment: no apparent nausea or vomiting Anesthetic complications: no Comments: Slight nausea treated with zofran  with quick resolution   No notable events documented.   Last Vitals:  Vitals:   12/12/23 1245 12/12/23 1250  BP: 128/81 129/80  Pulse: 81 66  Resp: 18 12  Temp:  (!) 36.4 C  SpO2: 97% 94%    Last Pain:  Vitals:   12/12/23 1250  TempSrc:   PainSc: 0-No pain                 Baltazar Bonier

## 2023-12-20 ENCOUNTER — Encounter: Payer: Self-pay | Admitting: Ophthalmology

## 2023-12-20 NOTE — Anesthesia Preprocedure Evaluation (Addendum)
 Anesthesia Evaluation  Patient identified by MRN, date of birth, ID band Patient awake and Patient confused  General Assessment Comment:Pt was alert to person, place, time, event. She had decreased cognitive performance with conversation and questioned who I was after being told.   Reviewed: Allergy & Precautions, H&P , NPO status , Patient's Chart, lab work & pertinent test results  Airway Mallampati: II  TM Distance: >3 FB Neck ROM: full    Dental no notable dental hx.    Pulmonary  States she has long covid syndrome   Pulmonary exam normal        Cardiovascular Normal cardiovascular exam+ Valvular Problems/Murmurs MR   04-22-23 echo  1. Left ventricular ejection fraction, by estimation, is 30 to 35%. Left  ventricular ejection fraction by 3D volume is 31 %. Left ventricular  ejection fraction by PLAX is 34 %. The left ventricle has moderately  decreased function. The left ventricle  demonstrates global hypokinesis. Left ventricular diastolic parameters are  consistent with Grade I diastolic dysfunction (impaired relaxation).   2. Right ventricular systolic function is mildly reduced. The right  ventricular size is normal.   3. The mitral valve is normal in structure. Mild mitral valve  regurgitation. No evidence of mitral stenosis.   4. The aortic valve has an indeterminant number of cusps. Aortic valve  regurgitation is moderate. No aortic stenosis is present.   5. The inferior vena cava is normal in size with greater than 50%  respiratory variability, suggesting right atrial pressure of 3 mmHg.      Neuro/Psych  Headaches PSYCHIATRIC DISORDERS Anxiety     CVA, No Residual Symptoms    GI/Hepatic negative GI ROS, Neg liver ROS,,,  Endo/Other  negative endocrine ROS    Renal/GU      Musculoskeletal   Abdominal   Peds  Hematology  (+) Blood dyscrasia, anemia   Anesthesia Other  Findings Allergy  Anxiety Cataract  Depression GERD (gastroesophageal reflux disease) Combined fat and carbohydrate induced hyperlipemia Anemia  Migraine headache Long COVID  Vertigo Chronic cough  DVT (deep vein thrombosis) in pregnancy HFrEF (heart failure with reduced ejection fraction) (HCC)  Mild mitral regurgitation by prior echocardiogram Moderate aortic regurgitation  Grade I diastolic dysfunction     Reproductive/Obstetrics negative OB ROS                              Anesthesia Physical Anesthesia Plan  ASA: 3  Anesthesia Plan: MAC   Post-op Pain Management:    Induction: Intravenous  PONV Risk Score and Plan:   Airway Management Planned: Natural Airway and Nasal Cannula  Additional Equipment:   Intra-op Plan:   Post-operative Plan:   Informed Consent: I have reviewed the patients History and Physical, chart, labs and discussed the procedure including the risks, benefits and alternatives for the proposed anesthesia with the patient or authorized representative who has indicated his/her understanding and acceptance.       Plan Discussed with: Anesthesiologist, CRNA and Surgeon  Anesthesia Plan Comments: (Pt's sister in law was present for consent. )         Anesthesia Quick Evaluation

## 2023-12-22 NOTE — Discharge Instructions (Signed)

## 2023-12-26 ENCOUNTER — Encounter: Payer: Self-pay | Admitting: Ophthalmology

## 2023-12-26 ENCOUNTER — Ambulatory Visit
Admission: RE | Admit: 2023-12-26 | Discharge: 2023-12-26 | Disposition: A | Discharge status: Home / Self Care | Attending: Ophthalmology | Admitting: Ophthalmology

## 2023-12-26 ENCOUNTER — Encounter
Admission: RE | Disposition: A | Discharge status: Home / Self Care | Payer: Self-pay | Source: Home / Self Care | Attending: Ophthalmology

## 2023-12-26 ENCOUNTER — Other Ambulatory Visit: Payer: Self-pay

## 2023-12-26 ENCOUNTER — Ambulatory Visit: Payer: Self-pay | Admitting: Anesthesiology

## 2023-12-26 DIAGNOSIS — I340 Nonrheumatic mitral (valve) insufficiency: Secondary | ICD-10-CM | POA: Insufficient documentation

## 2023-12-26 DIAGNOSIS — Z8673 Personal history of transient ischemic attack (TIA), and cerebral infarction without residual deficits: Secondary | ICD-10-CM | POA: Insufficient documentation

## 2023-12-26 DIAGNOSIS — H2512 Age-related nuclear cataract, left eye: Secondary | ICD-10-CM | POA: Insufficient documentation

## 2023-12-26 HISTORY — PX: CATARACT EXTRACTION W/PHACO: SHX586

## 2023-12-26 HISTORY — DX: Other ill-defined heart diseases: I51.89

## 2023-12-26 SURGERY — PHACOEMULSIFICATION, CATARACT, WITH IOL INSERTION
Anesthesia: Monitor Anesthesia Care | Laterality: Left

## 2023-12-26 MED ORDER — LIDOCAINE HCL (PF) 2 % IJ SOLN
INTRAMUSCULAR | Status: DC | PRN
  Administered 2023-12-26: 4 mL via INTRAOCULAR

## 2023-12-26 MED ORDER — TETRACAINE HCL 0.5 % OP SOLN
OPHTHALMIC | Status: AC
  Filled 2023-12-26: qty 4

## 2023-12-26 MED ORDER — MOXIFLOXACIN HCL 0.5 % OP SOLN
OPHTHALMIC | Status: DC | PRN
  Administered 2023-12-26: .2 mL via OPHTHALMIC

## 2023-12-26 MED ORDER — SIGHTPATH DOSE#1 BSS IO SOLN
INTRAOCULAR | Status: DC | PRN
  Administered 2023-12-26: 15 mL via INTRAOCULAR

## 2023-12-26 MED ORDER — FENTANYL CITRATE (PF) 100 MCG/2ML IJ SOLN
INTRAMUSCULAR | Status: DC | PRN
Start: 2023-12-26 — End: 2023-12-26
  Administered 2023-12-26: 50 ug via INTRAVENOUS

## 2023-12-26 MED ORDER — MIDAZOLAM HCL 2 MG/2ML IJ SOLN
INTRAMUSCULAR | Status: DC | PRN
  Administered 2023-12-26: 1 mg via INTRAVENOUS

## 2023-12-26 MED ORDER — SEVOFLURANE IN SOLN
RESPIRATORY_TRACT | Status: AC
  Filled 2023-12-26: qty 250

## 2023-12-26 MED ORDER — ARMC OPHTHALMIC DILATING DROPS
OPHTHALMIC | Status: AC
  Filled 2023-12-26: qty 0.5

## 2023-12-26 MED ORDER — MIDAZOLAM HCL 2 MG/2ML IJ SOLN
INTRAMUSCULAR | Status: AC
  Filled 2023-12-26: qty 2

## 2023-12-26 MED ORDER — SIGHTPATH DOSE#1 NA HYALUR & NA CHOND-NA HYALUR IO KIT
PACK | INTRAOCULAR | Status: DC | PRN
  Administered 2023-12-26: 1 via OPHTHALMIC

## 2023-12-26 MED ORDER — TETRACAINE HCL 0.5 % OP SOLN
1.0000 [drp] | OPHTHALMIC | Status: DC | PRN
  Administered 2023-12-26 (×3): 1 [drp] via OPHTHALMIC

## 2023-12-26 MED ORDER — ARMC OPHTHALMIC DILATING DROPS
1.0000 | OPHTHALMIC | Status: DC | PRN
  Administered 2023-12-26 (×3): 1 via OPHTHALMIC

## 2023-12-26 MED ORDER — SIGHTPATH DOSE#1 BSS IO SOLN
INTRAOCULAR | Status: DC | PRN
  Administered 2023-12-26: 99 mL via OPHTHALMIC

## 2023-12-26 MED ORDER — FENTANYL CITRATE (PF) 100 MCG/2ML IJ SOLN
INTRAMUSCULAR | Status: AC
  Filled 2023-12-26: qty 2

## 2023-12-26 SURGICAL SUPPLY — 12 items
CATARACT SUITE SIGHTPATH (MISCELLANEOUS) ×1 IMPLANT
DISSECTOR HYDRO NUCLEUS 50X22 (MISCELLANEOUS) ×1 IMPLANT
FEE CATARACT SUITE SIGHTPATH (MISCELLANEOUS) ×1 IMPLANT
GLOVE PI ULTRA LF STRL 7.5 (GLOVE) ×1 IMPLANT
GLOVE SURG POLYISOPRENE 8.5 (GLOVE) ×1 IMPLANT
GLOVE SURG PROTEXIS BL SZ6.5 (GLOVE) ×1 IMPLANT
GLOVE SURG SYN 6.5 PF PI BL (GLOVE) ×1 IMPLANT
GLOVE SURG SYN 8.5 PF PI BL (GLOVE) ×1 IMPLANT
LENS IOL TECNIS EYHANCE 21.5 (Intraocular Lens) IMPLANT
NDL FILTER BLUNT 18X1 1/2 (NEEDLE) ×1 IMPLANT
NEEDLE FILTER BLUNT 18X1 1/2 (NEEDLE) ×1 IMPLANT
SYR 3ML LL SCALE MARK (SYRINGE) ×1 IMPLANT
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
## 2023-12-26 NOTE — Anesthesia Postprocedure Evaluation (Signed)
 Anesthesia Post Note  Patient: Conservation officer, historic buildings  Procedure(s) Performed: PHACOEMULSIFICATION, CATARACT, WITH IOL INSERTION 6.05, 00:46.1 (Left)  Patient location during evaluation: PACU Anesthesia Type: MAC Level of consciousness: awake and alert Pain management: pain level controlled Vital Signs Assessment: post-procedure vital signs reviewed and stable Respiratory status: spontaneous breathing, nonlabored ventilation and respiratory function stable Cardiovascular status: stable and blood pressure returned to baseline Postop Assessment: no apparent nausea or vomiting Anesthetic complications: no   No notable events documented.   Last Vitals:  Vitals:   12/26/23 1147 12/26/23 1152  BP: 98/74 106/70  Pulse: 69 65  Resp: 10 13  Temp: (!) 36.2 C (!) 36.2 C  SpO2: 96% 97%    Last Pain:  Vitals:   12/26/23 1152  TempSrc:   PainSc: 0-No pain                 Baltazar Bonier

## 2023-12-26 NOTE — Op Note (Signed)
 OPERATIVE NOTE  Teresa Sherman [id number] 12/26/2023   PREOPERATIVE DIAGNOSIS:  Nuclear sclerotic cataract left eye.  H25.12   POSTOPERATIVE DIAGNOSIS:    Nuclear sclerotic cataract left eye.     PROCEDURE:  Phacoemusification with posterior chamber intraocular lens placement of the left eye   LENS:   Implant Name Type Inv. Item Serial No. Manufacturer Lot No. LRB No. Used Action  LENS IOL TECNIS EYHANCE 21.5 - [id number] Intraocular Lens LENS IOL TECNIS EYHANCE 21.5 [id number] [employer]  Left 1 Implanted      Procedure(s): PHACOEMULSIFICATION, CATARACT, WITH IOL INSERTION 6.05, 00:46.1 (Left)  SURGEON:  Dusty Gin, MD, MPH   ANESTHESIA:  Topical with tetracaine  drops augmented with 1% preservative-free intracameral lidocaine .  ESTIMATED BLOOD LOSS: <1 mL   COMPLICATIONS:  None.   DESCRIPTION OF PROCEDURE:  The patient was identified in the holding room and transported to the operating room and placed in the supine position under the operating microscope.  The left eye was identified as the operative eye and it was prepped and draped in the usual sterile ophthalmic fashion.   A 1.0 millimeter clear-corneal paracentesis was made at the 5:00 position. 0.5 ml of preservative-free 1% lidocaine  with epinephrine  was injected into the anterior chamber.  The anterior chamber was filled with viscoelastic.  A 2.4 millimeter keratome was used to make a near-clear corneal incision at the 2:00 position.  A curvilinear capsulorrhexis was made with a cystotome and capsulorrhexis forceps.  Balanced salt  solution was used to hydrodissect and hydrodelineate the nucleus.   Phacoemulsification was then used in stop and chop fashion to remove the lens nucleus and epinucleus.  The remaining cortex was then removed using the irrigation and aspiration handpiece. Viscoelastic was then placed into the capsular bag to distend it for lens placement.  A lens was then injected into the capsular bag.  The  remaining viscoelastic was aspirated.   Wounds were hydrated with balanced salt  solution.  The anterior chamber was inflated to a physiologic pressure with balanced salt  solution.  Intracameral vigamox  0.1 mL undiltued was injected into the eye and a drop placed onto the ocular surface.  No wound leaks were noted.  The patient was taken to the recovery room in stable condition without complications of anesthesia or surgery  Dusty Gin 12/26/2023, 11:45 AM

## 2023-12-26 NOTE — Transfer of Care (Signed)
 Immediate Anesthesia Transfer of Care Note  Patient: Teresa Sherman  Procedure(s) Performed: PHACOEMULSIFICATION, CATARACT, WITH IOL INSERTION (Left)  Patient Location: PACU  Anesthesia Type: MAC  Level of Consciousness: awake, alert  and patient cooperative  Airway and Oxygen Therapy: Patient Spontanous Breathing and Patient connected to supplemental oxygen  Post-op Assessment: Post-op Vital signs reviewed, Patient's Cardiovascular Status Stable, Respiratory Function Stable, Patent Airway and No signs of Nausea or vomiting  Post-op Vital Signs: Reviewed and stable  Complications: No notable events documented.

## 2024-03-13 ENCOUNTER — Ambulatory Visit: Admitting: Dermatology

## 2024-03-13 ENCOUNTER — Encounter: Payer: Self-pay | Admitting: Dermatology

## 2024-03-13 DIAGNOSIS — L821 Other seborrheic keratosis: Secondary | ICD-10-CM

## 2024-03-13 DIAGNOSIS — L219 Seborrheic dermatitis, unspecified: Secondary | ICD-10-CM | POA: Diagnosis not present

## 2024-03-13 DIAGNOSIS — D229 Melanocytic nevi, unspecified: Secondary | ICD-10-CM

## 2024-03-13 DIAGNOSIS — D2239 Melanocytic nevi of other parts of face: Secondary | ICD-10-CM | POA: Diagnosis not present

## 2024-03-13 MED ORDER — KETOCONAZOLE 2 % EX CREA: 1.0000 | TOPICAL_CREAM | Freq: Two times a day (BID) | CUTANEOUS | 1 refills | Status: AC

## 2024-03-13 NOTE — Progress Notes (Signed)
   Follow-Up Visit   Subjective  Teresa Sherman is a 78 y.o. female who presents for the following: rash and breaking out at face for a few weeks, worse on right side. Not using anything to treat. Also a spot at left face that was bigger and had pus drain from it a few weeks ago.   The following portions of the chart were reviewed this encounter and updated as appropriate: medications, allergies, medical history  Review of Systems:  No other skin or systemic complaints except as noted in HPI or Assessment and Plan.  Objective  Well appearing patient in no apparent distress; mood and affect are within normal limits.   A focused examination was performed of the following areas: face  Relevant exam findings are noted in the Assessment and Plan.    Assessment & Plan   SEBORRHEIC DERMATITIS Exam: Pink patches with greasy scale at eyebrows glabella forehead nasolabial folds  flared  Seborrheic Dermatitis is a chronic persistent rash characterized by pinkness and scaling most commonly of the mid face but also can occur on the scalp (dandruff), ears; mid chest, mid back and groin.  It tends to be exacerbated by stress and cooler weather.  People who have neurologic disease may experience new onset or exacerbation of existing seborrheic dermatitis.  The condition is not curable but treatable and can be controlled.  Treatment Plan: Start Zoryve samples twice daily to aa rash face.  Lot # [id number]    Exp: 06/22/2026 Start ketoconazole  2% cr twice daily to aa rash face.  May mix them together and then apply.   SEBORRHEIC KERATOSIS - Stuck-on, waxy, tan-brown papules and/or plaques cheeks - Benign-appearing - Discussed benign etiology and prognosis. - Observe - Call for any changes  MELANOCYTIC NEVI Exam: Tan-brown and/or pink-flesh-colored symmetric macule L cheek  Treatment Plan: Benign appearing on exam today. Recommend observation. Call clinic for new or changing moles.  Recommend daily use of broad spectrum spf 30+ sunscreen to sun-exposed areas.  Removal would leave a scar that is more noticeable   SEBORRHEIC DERMATITIS   SEBORRHEIC KERATOSES   MULTIPLE BENIGN NEVI    Return if symptoms worsen or fail to improve.  Teresa Sherman, RMA, am acting as scribe for Boneta Sharps, MD .   Documentation: I have reviewed the above documentation for accuracy and completeness, and I agree with the above.  Boneta Sharps, MD

## 2024-03-13 NOTE — Patient Instructions (Addendum)
 Treatment Plan: Start Zoryve samples twice daily to affected areas of rash at face.  Start ketoconazole  2% cr twice daily to affected areas of rash at face.  May mix them together and then apply.  Due to recent changes in healthcare laws, you may see results of your pathology and/or laboratory studies on MyChart before the doctors have had a chance to review them. We understand that in some cases there may be results that are confusing or concerning to you. Please understand that not all results are received at the same time and often the doctors may need to interpret multiple results in order to provide you with the best plan of care or course of treatment. Therefore, we ask that you please give us  2 business days to thoroughly review all your results before contacting the office for clarification. Should we see a critical lab result, you will be contacted sooner.   If You Need Anything After Your Visit  If you have any questions or concerns for your doctor, please call our main line at [phone] and press option 4 to reach your doctor's medical assistant. If no one answers, please leave a voicemail as directed and we will return your call as soon as possible. Messages left after 4 pm will be answered the following business day.   You may also send us  a message via MyChart. We typically respond to MyChart messages within 1-2 business days.  For prescription refills, please ask your pharmacy to contact our office. Our fax number is [phone].  If you have an urgent issue when the clinic is closed that cannot wait until the next business day, you can page your doctor at the number below.    Please note that while we do our best to be available for urgent issues outside of office hours, we are not available 24/7.   If you have an urgent issue and are unable to reach us , you may choose to seek medical care at your doctor's office, retail clinic, urgent care center, or emergency room.  If you  have a medical emergency, please immediately call 911 or go to the emergency department.  Pager Numbers  - Dr. Hester: [phone]  - Dr. Jackquline: [phone]  - Dr. Claudene: [phone]   In the event of inclement weather, please call our main line at [phone] for an update on the status of any delays or closures.  Dermatology Medication Tips: Please keep the boxes that topical medications come in in order to help keep track of the instructions about where and how to use these. Pharmacies typically print the medication instructions only on the boxes and not directly on the medication tubes.   If your medication is too expensive, please contact our office at [phone] option 4 or send us  a message through MyChart.   We are unable to tell what your co-pay for medications will be in advance as this is different depending on your insurance coverage. However, we may be able to find a substitute medication at lower cost or fill out paperwork to get insurance to cover a needed medication.   If a prior authorization is required to get your medication covered by your insurance company, please allow us  1-2 business days to complete this process.  Drug prices often vary depending on where the prescription is filled and some pharmacies may offer cheaper prices.  The website www.goodrx.com contains coupons for medications through different pharmacies. The prices here do not account for what the cost may be  with help from insurance (it may be cheaper with your insurance), but the website can give you the price if you did not use any insurance.  - You can print the associated coupon and take it with your prescription to the pharmacy.  - You may also stop by our office during regular business hours and pick up a GoodRx coupon card.  - If you need your prescription sent electronically to a different pharmacy, notify our office through [facility] or by phone at [phone] option  4.     Si Usted Necesita Algo Despus de Su Visita  Tambin puede enviarnos un mensaje a travs de Clinical cytogeneticist. Por lo general respondemos a los mensajes de MyChart en el transcurso de 1 a 2 das hbiles.  Para renovar recetas, por favor pida a su farmacia que se ponga en contacto con nuestra oficina. Randi lakes de fax es [place] [phone].  Si tiene un asunto urgente cuando la clnica est cerrada y que no puede esperar hasta el siguiente da hbil, puede llamar/localizar a su doctor(a) al nmero que aparece a continuacin.   Por favor, tenga en cuenta que aunque hacemos todo lo posible para estar disponibles para asuntos urgentes fuera del horario de [place], no estamos disponibles las 24 horas del da, los 7 [address]  [address] de la [place].   Si tiene un problema urgente y no puede comunicarse con nosotros, puede optar por buscar atencin mdica  en el consultorio de su doctor(a), en una clnica privada, en un centro de atencin urgente o en una sala de emergencias.  Si tiene Engineer, drilling, por favor llame inmediatamente al 911 o vaya a la sala de emergencias.  Nmeros de bper  - Dr. Hester: [phone]  - Dra. Jackquline: [phone]  - Dr. Claudene: [phone]   En caso de inclemencias del tiempo, por favor llame a landry capes principal al [phone] para una actualizacin sobre el [place] de cualquier retraso o cierre.  Consejos para la medicacin en dermatologa: Por favor, guarde las cajas en las que vienen los medicamentos de uso tpico para ayudarle a seguir las instrucciones sobre dnde y cmo usarlos. Las farmacias generalmente imprimen las instrucciones del medicamento slo en las cajas y no directamente en los tubos del [place].   Si su medicamento es muy caro, por favor, pngase en contacto con landry rieger llamando al [phone] y presione la opcin 4 o envenos un mensaje a travs de Clinical cytogeneticist.   No podemos decirle cul ser su copago por los medicamentos por  adelantado ya que esto es diferente dependiendo de la cobertura de su seguro. Sin embargo, es posible que podamos encontrar un medicamento sustituto a Audiological scientist un formulario para que el seguro cubra el medicamento que se considera necesario.   Si se requiere una autorizacin previa para que su compaa de seguros malta su medicamento, por favor permtanos de 1 a 2 das hbiles para completar este proceso.  Los precios de los medicamentos varan con frecuencia dependiendo del Environmental consultant de dnde se surte la receta y alguna farmacias pueden ofrecer precios ms baratos.  El sitio web www.goodrx.com tiene cupones para medicamentos de Health and safety inspector. Los precios aqu no tienen en cuenta lo que podra costar con la ayuda del seguro (puede ser ms barato con su seguro), pero el sitio web puede darle el precio si no utiliz Tourist information centre manager.  - Puede imprimir el cupn correspondiente y llevarlo con su receta a la farmacia.  - Tambin puede pasar por nuestra oficina  durante el horario de atencin regular y Education officer, museum una tarjeta de cupones de GoodRx.  - Si necesita que su receta se enve electrnicamente a una farmacia diferente, informe a nuestra oficina a travs de MyChart de [place] o por telfono llamando al [phone] y presione la opcin 4.
